# Patient Record
Sex: Female | Born: 1935 | Race: White | Hispanic: No | Smoking: Current every day smoker
Health system: Southern US, Community
[De-identification: ages and names within clinical notes are randomized; demographics above are authoritative.]

## PROBLEM LIST (undated history)

## (undated) DIAGNOSIS — R600 Localized edema: Secondary | ICD-10-CM

## (undated) DIAGNOSIS — M8580 Other specified disorders of bone density and structure, unspecified site: Secondary | ICD-10-CM

## (undated) DIAGNOSIS — K279 Peptic ulcer, site unspecified, unspecified as acute or chronic, without hemorrhage or perforation: Secondary | ICD-10-CM

## (undated) DIAGNOSIS — K449 Diaphragmatic hernia without obstruction or gangrene: Secondary | ICD-10-CM

## (undated) DIAGNOSIS — I10 Essential (primary) hypertension: Secondary | ICD-10-CM

## (undated) DIAGNOSIS — F419 Anxiety disorder, unspecified: Secondary | ICD-10-CM

## (undated) DIAGNOSIS — E785 Hyperlipidemia, unspecified: Secondary | ICD-10-CM

## (undated) DIAGNOSIS — K8020 Calculus of gallbladder without cholecystitis without obstruction: Secondary | ICD-10-CM

## (undated) DIAGNOSIS — F0390 Unspecified dementia without behavioral disturbance: Secondary | ICD-10-CM

## (undated) DIAGNOSIS — K219 Gastro-esophageal reflux disease without esophagitis: Secondary | ICD-10-CM

## (undated) DIAGNOSIS — E039 Hypothyroidism, unspecified: Secondary | ICD-10-CM

## (undated) DIAGNOSIS — I739 Peripheral vascular disease, unspecified: Secondary | ICD-10-CM

## (undated) DIAGNOSIS — M545 Low back pain, unspecified: Secondary | ICD-10-CM

## (undated) DIAGNOSIS — J449 Chronic obstructive pulmonary disease, unspecified: Secondary | ICD-10-CM

## (undated) DIAGNOSIS — J189 Pneumonia, unspecified organism: Secondary | ICD-10-CM

## (undated) DIAGNOSIS — K5730 Diverticulosis of large intestine without perforation or abscess without bleeding: Secondary | ICD-10-CM

## (undated) DIAGNOSIS — Z86010 Personal history of colonic polyps: Secondary | ICD-10-CM

## (undated) HISTORY — DX: Low back pain: M54.5

## (undated) HISTORY — DX: Diaphragmatic hernia without obstruction or gangrene: K44.9

## (undated) HISTORY — DX: Anxiety disorder, unspecified: F41.9

## (undated) HISTORY — DX: Other specified disorders of bone density and structure, unspecified site: M85.80

## (undated) HISTORY — DX: Pneumonia, unspecified organism: J18.9

## (undated) HISTORY — DX: Personal history of colonic polyps: Z86.010

## (undated) HISTORY — PX: TUBAL LIGATION: SHX77

## (undated) HISTORY — PX: ABDOMINAL HYSTERECTOMY: SHX81

## (undated) HISTORY — PX: CHOLECYSTECTOMY: SHX55

## (undated) HISTORY — DX: Diverticulosis of large intestine without perforation or abscess without bleeding: K57.30

## (undated) HISTORY — DX: Chronic obstructive pulmonary disease, unspecified: J44.9

## (undated) HISTORY — PX: CARPAL TUNNEL RELEASE: SHX101

## (undated) HISTORY — DX: Hyperlipidemia, unspecified: E78.5

## (undated) HISTORY — DX: Essential (primary) hypertension: I10

## (undated) HISTORY — PX: ABDOMINAL ADHESION SURGERY: SHX90

## (undated) HISTORY — PX: ROTATOR CUFF REPAIR: SHX139

## (undated) HISTORY — DX: Low back pain, unspecified: M54.50

## (undated) HISTORY — PX: CATARACT EXTRACTION W/ INTRAOCULAR LENS  IMPLANT, BILATERAL: SHX1307

## (undated) HISTORY — PX: NECK SURGERY: SHX720

## (undated) HISTORY — DX: Unspecified dementia, unspecified severity, without behavioral disturbance, psychotic disturbance, mood disturbance, and anxiety: F03.90

## (undated) HISTORY — DX: Localized edema: R60.0

## (undated) HISTORY — PX: APPENDECTOMY: SHX54

## (undated) HISTORY — DX: Gastro-esophageal reflux disease without esophagitis: K21.9

## (undated) HISTORY — DX: Peripheral vascular disease, unspecified: I73.9

## (undated) HISTORY — DX: Calculus of gallbladder without cholecystitis without obstruction: K80.20

## (undated) HISTORY — DX: Peptic ulcer, site unspecified, unspecified as acute or chronic, without hemorrhage or perforation: K27.9

## (undated) HISTORY — DX: Hypothyroidism, unspecified: E03.9

---

## 1998-08-09 ENCOUNTER — Ambulatory Visit (HOSPITAL_COMMUNITY): Admission: RE | Admit: 1998-08-09 | Discharge: 1998-08-09 | Payer: Self-pay | Admitting: Family Medicine

## 1998-08-09 ENCOUNTER — Encounter: Payer: Self-pay | Admitting: Family Medicine

## 1998-09-09 ENCOUNTER — Encounter: Payer: Self-pay | Admitting: Neurological Surgery

## 1998-09-09 ENCOUNTER — Inpatient Hospital Stay (HOSPITAL_COMMUNITY): Admission: RE | Admit: 1998-09-09 | Discharge: 1998-09-11 | Payer: Self-pay | Admitting: Neurological Surgery

## 1999-09-22 ENCOUNTER — Encounter: Admission: RE | Admit: 1999-09-22 | Discharge: 1999-09-22 | Payer: Self-pay | Admitting: Family Medicine

## 1999-09-22 ENCOUNTER — Encounter: Payer: Self-pay | Admitting: Family Medicine

## 1999-10-10 DIAGNOSIS — Z860100 Personal history of colon polyps, unspecified: Secondary | ICD-10-CM

## 1999-10-10 HISTORY — DX: Personal history of colonic polyps: Z86.010

## 1999-10-10 HISTORY — DX: Personal history of colon polyps, unspecified: Z86.0100

## 1999-11-08 ENCOUNTER — Encounter: Payer: Self-pay | Admitting: Neurological Surgery

## 1999-11-08 ENCOUNTER — Encounter: Admission: RE | Admit: 1999-11-08 | Discharge: 1999-11-08 | Payer: Self-pay | Admitting: Neurological Surgery

## 1999-12-15 ENCOUNTER — Encounter: Admission: RE | Admit: 1999-12-15 | Discharge: 2000-01-12 | Payer: Self-pay | Admitting: Neurological Surgery

## 2000-05-29 ENCOUNTER — Ambulatory Visit (HOSPITAL_COMMUNITY): Admission: RE | Admit: 2000-05-29 | Discharge: 2000-05-29 | Payer: Self-pay | Admitting: Gastroenterology

## 2000-05-29 ENCOUNTER — Encounter (INDEPENDENT_AMBULATORY_CARE_PROVIDER_SITE_OTHER): Payer: Self-pay

## 2000-09-24 ENCOUNTER — Encounter: Admission: RE | Admit: 2000-09-24 | Discharge: 2000-09-24 | Payer: Self-pay | Admitting: Family Medicine

## 2000-09-24 ENCOUNTER — Encounter: Payer: Self-pay | Admitting: Family Medicine

## 2000-11-21 ENCOUNTER — Encounter: Payer: Self-pay | Admitting: Neurological Surgery

## 2000-11-21 ENCOUNTER — Encounter: Admission: RE | Admit: 2000-11-21 | Discharge: 2000-11-21 | Payer: Self-pay | Admitting: Neurological Surgery

## 2000-11-26 ENCOUNTER — Encounter: Admission: RE | Admit: 2000-11-26 | Discharge: 2001-01-08 | Payer: Self-pay | Admitting: Neurological Surgery

## 2000-12-12 ENCOUNTER — Encounter: Payer: Self-pay | Admitting: Neurological Surgery

## 2000-12-12 ENCOUNTER — Encounter: Admission: RE | Admit: 2000-12-12 | Discharge: 2000-12-12 | Payer: Self-pay | Admitting: Neurological Surgery

## 2000-12-18 ENCOUNTER — Encounter: Admission: RE | Admit: 2000-12-18 | Discharge: 2000-12-18 | Payer: Self-pay | Admitting: Family Medicine

## 2000-12-18 ENCOUNTER — Encounter: Payer: Self-pay | Admitting: Family Medicine

## 2001-02-05 ENCOUNTER — Encounter: Admission: RE | Admit: 2001-02-05 | Discharge: 2001-03-12 | Payer: Self-pay | Admitting: Family Medicine

## 2001-02-26 ENCOUNTER — Encounter: Payer: Self-pay | Admitting: Family Medicine

## 2001-02-26 ENCOUNTER — Ambulatory Visit (HOSPITAL_COMMUNITY): Admission: RE | Admit: 2001-02-26 | Discharge: 2001-02-26 | Payer: Self-pay | Admitting: Family Medicine

## 2001-09-25 ENCOUNTER — Encounter: Payer: Self-pay | Admitting: Family Medicine

## 2001-09-25 ENCOUNTER — Encounter: Admission: RE | Admit: 2001-09-25 | Discharge: 2001-09-25 | Payer: Self-pay | Admitting: Family Medicine

## 2002-09-08 ENCOUNTER — Ambulatory Visit (HOSPITAL_COMMUNITY): Admission: RE | Admit: 2002-09-08 | Discharge: 2002-09-08 | Payer: Self-pay | Admitting: Internal Medicine

## 2002-09-08 ENCOUNTER — Encounter: Payer: Self-pay | Admitting: Internal Medicine

## 2002-09-29 ENCOUNTER — Encounter: Payer: Self-pay | Admitting: Internal Medicine

## 2002-09-29 ENCOUNTER — Encounter: Admission: RE | Admit: 2002-09-29 | Discharge: 2002-09-29 | Payer: Self-pay | Admitting: Internal Medicine

## 2002-10-31 ENCOUNTER — Encounter: Payer: Self-pay | Admitting: Neurological Surgery

## 2002-10-31 ENCOUNTER — Ambulatory Visit (HOSPITAL_COMMUNITY): Admission: RE | Admit: 2002-10-31 | Discharge: 2002-10-31 | Payer: Self-pay | Admitting: Neurological Surgery

## 2002-11-20 ENCOUNTER — Encounter: Admission: RE | Admit: 2002-11-20 | Discharge: 2002-11-20 | Payer: Self-pay | Admitting: Obstetrics and Gynecology

## 2002-11-20 ENCOUNTER — Encounter: Payer: Self-pay | Admitting: Obstetrics and Gynecology

## 2003-04-07 ENCOUNTER — Encounter: Admission: RE | Admit: 2003-04-07 | Discharge: 2003-04-07 | Payer: Self-pay | Admitting: Internal Medicine

## 2003-04-07 ENCOUNTER — Encounter: Payer: Self-pay | Admitting: Internal Medicine

## 2003-07-29 ENCOUNTER — Ambulatory Visit (HOSPITAL_COMMUNITY): Admission: RE | Admit: 2003-07-29 | Discharge: 2003-07-29 | Payer: Self-pay | Admitting: *Deleted

## 2003-07-29 ENCOUNTER — Encounter: Payer: Self-pay | Admitting: *Deleted

## 2003-11-03 ENCOUNTER — Ambulatory Visit (HOSPITAL_COMMUNITY): Admission: RE | Admit: 2003-11-03 | Discharge: 2003-11-03 | Payer: Self-pay | Admitting: Dermatology

## 2004-08-10 ENCOUNTER — Ambulatory Visit: Payer: Self-pay | Admitting: *Deleted

## 2004-09-08 ENCOUNTER — Ambulatory Visit: Payer: Self-pay | Admitting: Internal Medicine

## 2004-10-21 ENCOUNTER — Ambulatory Visit (HOSPITAL_COMMUNITY): Admission: RE | Admit: 2004-10-21 | Discharge: 2004-10-21 | Payer: Self-pay | Admitting: Internal Medicine

## 2004-11-09 ENCOUNTER — Ambulatory Visit: Payer: Self-pay | Admitting: Endocrinology

## 2005-02-06 ENCOUNTER — Ambulatory Visit: Payer: Self-pay | Admitting: Internal Medicine

## 2005-03-03 ENCOUNTER — Ambulatory Visit: Payer: Self-pay | Admitting: Internal Medicine

## 2005-05-09 ENCOUNTER — Ambulatory Visit: Payer: Self-pay | Admitting: Internal Medicine

## 2005-06-06 ENCOUNTER — Ambulatory Visit (HOSPITAL_COMMUNITY): Admission: RE | Admit: 2005-06-06 | Discharge: 2005-06-06 | Payer: Self-pay | Admitting: Obstetrics and Gynecology

## 2005-06-26 ENCOUNTER — Ambulatory Visit: Payer: Self-pay | Admitting: Gastroenterology

## 2005-07-26 ENCOUNTER — Ambulatory Visit: Payer: Self-pay | Admitting: Internal Medicine

## 2005-08-08 ENCOUNTER — Ambulatory Visit: Payer: Self-pay | Admitting: Internal Medicine

## 2005-08-10 ENCOUNTER — Ambulatory Visit: Payer: Self-pay | Admitting: Internal Medicine

## 2005-10-03 ENCOUNTER — Ambulatory Visit: Payer: Self-pay | Admitting: Cardiology

## 2005-10-03 ENCOUNTER — Inpatient Hospital Stay (HOSPITAL_COMMUNITY): Admission: EM | Admit: 2005-10-03 | Discharge: 2005-10-04 | Payer: Self-pay | Admitting: *Deleted

## 2005-10-10 ENCOUNTER — Ambulatory Visit: Payer: Self-pay

## 2005-10-16 ENCOUNTER — Ambulatory Visit: Payer: Self-pay | Admitting: Internal Medicine

## 2005-10-17 ENCOUNTER — Ambulatory Visit: Payer: Self-pay | Admitting: Cardiology

## 2005-11-09 ENCOUNTER — Ambulatory Visit: Payer: Self-pay | Admitting: Internal Medicine

## 2005-11-30 ENCOUNTER — Ambulatory Visit: Payer: Self-pay | Admitting: Cardiology

## 2005-12-13 ENCOUNTER — Ambulatory Visit (HOSPITAL_COMMUNITY): Admission: RE | Admit: 2005-12-13 | Discharge: 2005-12-13 | Payer: Self-pay | Admitting: Internal Medicine

## 2006-02-07 ENCOUNTER — Ambulatory Visit: Payer: Self-pay | Admitting: Internal Medicine

## 2006-03-26 ENCOUNTER — Ambulatory Visit: Payer: Self-pay | Admitting: Cardiology

## 2006-06-18 ENCOUNTER — Ambulatory Visit: Payer: Self-pay | Admitting: Internal Medicine

## 2006-07-30 ENCOUNTER — Ambulatory Visit: Payer: Self-pay | Admitting: Internal Medicine

## 2006-08-03 ENCOUNTER — Ambulatory Visit: Payer: Self-pay | Admitting: Internal Medicine

## 2007-01-01 ENCOUNTER — Encounter: Admission: RE | Admit: 2007-01-01 | Discharge: 2007-01-01 | Payer: Self-pay | Admitting: Internal Medicine

## 2007-01-10 ENCOUNTER — Ambulatory Visit: Payer: Self-pay | Admitting: Internal Medicine

## 2007-01-10 LAB — CONVERTED CEMR LAB
Alkaline Phosphatase: 85 units/L (ref 39–117)
Bilirubin, Direct: 0.1 mg/dL (ref 0.0–0.3)
Cholesterol: 185 mg/dL (ref 0–200)
Creatinine, Ser: 0.7 mg/dL (ref 0.4–1.2)
HDL: 34.5 mg/dL — ABNORMAL LOW (ref >39.0)
LDL Cholesterol: 125 mg/dL — ABNORMAL HIGH (ref 0–99)
Potassium: 4 meq/L (ref 3.5–5.1)
Sodium: 136 meq/L (ref 135–145)
Total CHOL/HDL Ratio: 5.4
Total Protein: 6.4 g/dL (ref 6.0–8.3)
Triglycerides: 126 mg/dL (ref 0–149)

## 2007-01-17 ENCOUNTER — Ambulatory Visit: Payer: Self-pay | Admitting: Internal Medicine

## 2007-04-16 ENCOUNTER — Ambulatory Visit: Payer: Self-pay | Admitting: Internal Medicine

## 2007-04-16 LAB — CONVERTED CEMR LAB
ALT: 17 units/L (ref 0–35)
AST: 20 units/L (ref 0–37)
VLDL: 10 mg/dL (ref 0–40)
Vit D, 1,25-Dihydroxy: 25 (ref 20–57)

## 2007-04-23 ENCOUNTER — Ambulatory Visit: Payer: Self-pay | Admitting: Internal Medicine

## 2007-05-08 ENCOUNTER — Ambulatory Visit: Payer: Self-pay | Admitting: Internal Medicine

## 2007-05-08 DIAGNOSIS — I6529 Occlusion and stenosis of unspecified carotid artery: Secondary | ICD-10-CM | POA: Insufficient documentation

## 2007-05-08 DIAGNOSIS — E039 Hypothyroidism, unspecified: Secondary | ICD-10-CM

## 2007-05-08 DIAGNOSIS — K219 Gastro-esophageal reflux disease without esophagitis: Secondary | ICD-10-CM | POA: Insufficient documentation

## 2007-05-08 DIAGNOSIS — I739 Peripheral vascular disease, unspecified: Secondary | ICD-10-CM | POA: Insufficient documentation

## 2007-05-08 DIAGNOSIS — Z87898 Personal history of other specified conditions: Secondary | ICD-10-CM | POA: Insufficient documentation

## 2007-07-10 ENCOUNTER — Ambulatory Visit: Payer: Self-pay | Admitting: Internal Medicine

## 2007-07-24 ENCOUNTER — Encounter: Payer: Self-pay | Admitting: Internal Medicine

## 2007-07-24 ENCOUNTER — Ambulatory Visit: Payer: Self-pay | Admitting: Internal Medicine

## 2007-07-24 DIAGNOSIS — E785 Hyperlipidemia, unspecified: Secondary | ICD-10-CM

## 2007-07-24 DIAGNOSIS — M810 Age-related osteoporosis without current pathological fracture: Secondary | ICD-10-CM | POA: Insufficient documentation

## 2007-10-07 ENCOUNTER — Ambulatory Visit: Payer: Self-pay | Admitting: Internal Medicine

## 2007-10-07 DIAGNOSIS — J209 Acute bronchitis, unspecified: Secondary | ICD-10-CM | POA: Insufficient documentation

## 2007-10-09 ENCOUNTER — Telehealth: Payer: Self-pay | Admitting: Internal Medicine

## 2007-10-22 ENCOUNTER — Ambulatory Visit: Payer: Self-pay | Admitting: Internal Medicine

## 2007-10-22 DIAGNOSIS — R079 Chest pain, unspecified: Secondary | ICD-10-CM | POA: Insufficient documentation

## 2007-10-23 LAB — CONVERTED CEMR LAB
AST: 19 units/L (ref 0–37)
BUN: 11 mg/dL (ref 6–23)
CO2: 34 meq/L — ABNORMAL HIGH (ref 19–32)
Calcium: 9.4 mg/dL (ref 8.4–10.5)
Chloride: 96 meq/L (ref 96–112)
Cholesterol: 136 mg/dL (ref 0–200)
GFR calc non Af Amer: 66 mL/min
Glucose, Bld: 139 mg/dL — ABNORMAL HIGH (ref 70–99)
TSH: 0.32 microintl units/mL — ABNORMAL LOW (ref 0.35–5.50)

## 2007-11-18 ENCOUNTER — Ambulatory Visit: Payer: Self-pay | Admitting: Internal Medicine

## 2007-11-18 DIAGNOSIS — R5383 Other fatigue: Secondary | ICD-10-CM

## 2007-11-18 DIAGNOSIS — R7301 Impaired fasting glucose: Secondary | ICD-10-CM

## 2007-11-18 DIAGNOSIS — R5381 Other malaise: Secondary | ICD-10-CM

## 2007-11-20 LAB — CONVERTED CEMR LAB
AST: 22 units/L (ref 0–37)
Creatinine, Ser: 1.1 mg/dL (ref 0.4–1.2)
Hgb A1c MFr Bld: 6.3 % — ABNORMAL HIGH (ref 4.6–6.0)
TSH: 0.8 microintl units/mL (ref 0.35–5.50)

## 2007-12-03 ENCOUNTER — Ambulatory Visit: Payer: Self-pay | Admitting: Internal Medicine

## 2007-12-04 LAB — CONVERTED CEMR LAB
Glucose, Bld: 132 mg/dL — ABNORMAL HIGH (ref 70–99)
TSH: 0.42 microintl units/mL (ref 0.35–5.50)

## 2008-01-30 ENCOUNTER — Telehealth: Payer: Self-pay | Admitting: Internal Medicine

## 2008-01-31 ENCOUNTER — Encounter: Payer: Self-pay | Admitting: Internal Medicine

## 2008-02-05 ENCOUNTER — Ambulatory Visit: Payer: Self-pay | Admitting: Internal Medicine

## 2008-06-04 ENCOUNTER — Ambulatory Visit: Payer: Self-pay | Admitting: Internal Medicine

## 2008-06-04 DIAGNOSIS — M25559 Pain in unspecified hip: Secondary | ICD-10-CM

## 2008-06-04 DIAGNOSIS — M549 Dorsalgia, unspecified: Secondary | ICD-10-CM | POA: Insufficient documentation

## 2008-06-08 ENCOUNTER — Ambulatory Visit: Payer: Self-pay | Admitting: Internal Medicine

## 2008-06-08 DIAGNOSIS — M545 Low back pain: Secondary | ICD-10-CM

## 2008-06-08 DIAGNOSIS — R609 Edema, unspecified: Secondary | ICD-10-CM

## 2008-07-08 ENCOUNTER — Ambulatory Visit: Payer: Self-pay | Admitting: Internal Medicine

## 2008-09-15 ENCOUNTER — Ambulatory Visit: Payer: Self-pay | Admitting: Internal Medicine

## 2008-09-30 ENCOUNTER — Ambulatory Visit: Payer: Self-pay | Admitting: Internal Medicine

## 2008-11-23 ENCOUNTER — Telehealth: Payer: Self-pay | Admitting: Internal Medicine

## 2009-01-21 ENCOUNTER — Ambulatory Visit: Payer: Self-pay | Admitting: Internal Medicine

## 2009-01-21 LAB — CONVERTED CEMR LAB
Alkaline Phosphatase: 65 units/L (ref 39–117)
Bilirubin, Direct: 0.1 mg/dL (ref 0.0–0.3)
CO2: 35 meq/L — ABNORMAL HIGH (ref 19–32)
Calcium: 9.5 mg/dL (ref 8.4–10.5)
Creatinine, Ser: 0.8 mg/dL (ref 0.4–1.2)
HDL: 38.8 mg/dL — ABNORMAL LOW (ref >39.00)
LDL Cholesterol: 67 mg/dL (ref 0–99)
Sodium: 139 meq/L (ref 135–145)
TSH: 0.9 microintl units/mL (ref 0.35–5.50)
Total Bilirubin: 1.2 mg/dL (ref 0.3–1.2)
Total CHOL/HDL Ratio: 3
Total Protein: 6.4 g/dL (ref 6.0–8.3)
Triglycerides: 90 mg/dL (ref 0.0–149.0)
VLDL: 18 mg/dL (ref 0.0–40.0)

## 2009-01-27 ENCOUNTER — Ambulatory Visit: Payer: Self-pay | Admitting: Internal Medicine

## 2009-01-27 DIAGNOSIS — D485 Neoplasm of uncertain behavior of skin: Secondary | ICD-10-CM

## 2009-01-27 DIAGNOSIS — R413 Other amnesia: Secondary | ICD-10-CM | POA: Insufficient documentation

## 2009-02-17 ENCOUNTER — Encounter: Payer: Self-pay | Admitting: Internal Medicine

## 2009-02-18 ENCOUNTER — Encounter: Payer: Self-pay | Admitting: Internal Medicine

## 2009-02-18 ENCOUNTER — Ambulatory Visit: Payer: Self-pay | Admitting: Internal Medicine

## 2009-04-28 ENCOUNTER — Ambulatory Visit: Payer: Self-pay | Admitting: Internal Medicine

## 2009-07-28 ENCOUNTER — Ambulatory Visit: Payer: Self-pay | Admitting: Internal Medicine

## 2009-07-28 LAB — CONVERTED CEMR LAB
AST: 20 units/L (ref 0–37)
Albumin: 4.1 g/dL (ref 3.5–5.2)
BUN: 14 mg/dL (ref 6–23)
Calcium: 9.2 mg/dL (ref 8.4–10.5)
Cholesterol: 130 mg/dL (ref 0–200)
Creatinine, Ser: 0.8 mg/dL (ref 0.4–1.2)
GFR calc non Af Amer: 75.01 mL/min (ref >60)
Glucose, Bld: 126 mg/dL — ABNORMAL HIGH (ref 70–99)
HDL: 40.9 mg/dL (ref >39.00)
Potassium: 4.3 meq/L (ref 3.5–5.1)
TSH: 0.74 microintl units/mL (ref 0.35–5.50)
Total Bilirubin: 0.8 mg/dL (ref 0.3–1.2)
Triglycerides: 72 mg/dL (ref 0.0–149.0)
VLDL: 14.4 mg/dL (ref 0.0–40.0)

## 2009-07-29 ENCOUNTER — Telehealth: Payer: Self-pay | Admitting: Internal Medicine

## 2009-08-03 ENCOUNTER — Ambulatory Visit: Payer: Self-pay | Admitting: Internal Medicine

## 2009-08-03 DIAGNOSIS — F17200 Nicotine dependence, unspecified, uncomplicated: Secondary | ICD-10-CM

## 2009-10-27 ENCOUNTER — Ambulatory Visit: Payer: Self-pay | Admitting: Internal Medicine

## 2010-01-19 ENCOUNTER — Ambulatory Visit: Payer: Self-pay | Admitting: Internal Medicine

## 2010-01-19 LAB — CONVERTED CEMR LAB
Albumin: 3.9 g/dL (ref 3.5–5.2)
Alkaline Phosphatase: 65 units/L (ref 39–117)
BUN: 14 mg/dL (ref 6–23)
CO2: 30 meq/L (ref 19–32)
Calcium: 8.9 mg/dL (ref 8.4–10.5)
Cholesterol: 120 mg/dL (ref 0–200)
GFR calc non Af Amer: 74.91 mL/min (ref >60)
Glucose, Bld: 115 mg/dL — ABNORMAL HIGH (ref 70–99)
HDL: 43.1 mg/dL (ref >39.00)
Hgb A1c MFr Bld: 5.8 % (ref 4.6–6.5)
Sodium: 140 meq/L (ref 135–145)
Total Protein: 6.5 g/dL (ref 6.0–8.3)
VLDL: 15.4 mg/dL (ref 0.0–40.0)

## 2010-01-25 ENCOUNTER — Ambulatory Visit: Payer: Self-pay | Admitting: Internal Medicine

## 2010-05-26 ENCOUNTER — Ambulatory Visit: Payer: Self-pay | Admitting: Internal Medicine

## 2010-05-26 LAB — CONVERTED CEMR LAB
CO2: 31 meq/L (ref 19–32)
Chloride: 100 meq/L (ref 96–112)
Glucose, Bld: 110 mg/dL — ABNORMAL HIGH (ref 70–99)
Sodium: 139 meq/L (ref 135–145)

## 2010-05-30 ENCOUNTER — Ambulatory Visit: Payer: Self-pay | Admitting: Internal Medicine

## 2010-05-30 DIAGNOSIS — M79609 Pain in unspecified limb: Secondary | ICD-10-CM | POA: Insufficient documentation

## 2010-07-27 ENCOUNTER — Ambulatory Visit: Payer: Self-pay | Admitting: Internal Medicine

## 2010-08-22 ENCOUNTER — Ambulatory Visit: Payer: Self-pay | Admitting: Internal Medicine

## 2010-10-04 ENCOUNTER — Telehealth: Payer: Self-pay | Admitting: Internal Medicine

## 2010-11-10 NOTE — Progress Notes (Signed)
Summary: [facility]   Phone Note Call from Patient Call back at Home Phone [phone]   Summary of Call: Patient is requesting a call back regarding gen prevacid - sounds like needs a PA? Initial call taken by: Lamar Sprinkles, CMA,  October 04, 2010 3:11 PM  Follow-up for Phone Call        Prevacid is covered per pharmacy. Pt would like cheaper alternative. Cheapest option is omeprazole. Follow-up by: Lamar Sprinkles, CMA,  October 06, 2010 2:32 PM  Additional Follow-up for Phone Call Additional follow up Details #1::        ok Additional Follow-up by: [name] MD,  October 06, 2010 4:00 PM    Additional Follow-up for Phone Call Additional follow up Details #2::    Pt informed  Follow-up by: Lamar Sprinkles, CMA,  October 06, 2010 4:36 PM  New/Updated Medications: OMEPRAZOLE 40 MG CPDR (OMEPRAZOLE) 1 by mouth qam for indigestion Prescriptions: OMEPRAZOLE 40 MG CPDR (OMEPRAZOLE) 1 by mouth qam for indigestion  #90 x 3   Entered and Authorized by:   [name] MD   Signed by:   Lamar Sprinkles, CMA on 10/06/2010   Method used:   Electronically to        [facility] [phone]* (retail)       [address]       [place], [place]  [address]       Ph: [phone]       Fax: [phone]   RxID:   [id number]

## 2010-11-10 NOTE — Assessment & Plan Note (Signed)
Summary: 3 mos f/u / #/ cd   Vital Signs:  Patient profile:   75 year old female Height:      61 inches Weight:      128.50 pounds BMI:     24.37 O2 Sat:      94 % on Room air Temp:     97.9 degrees F oral Pulse rate:   80 / minute BP sitting:   120 / 68  (left arm) Cuff size:   regular  Vitals Entered By: Lucious Groves (January 25, 2010 11:27 AM)  O2 Flow:  Room air CC: 3 mo f/u./kb Is Patient Diabetic? No Pain Assessment Patient in pain? no        CC:  3 mo f/u./kb.  History of Present Illness: C/o leg swelling daily x very long time The patient presents for a follow up of hypertension, COPD, memory loss, hyperlipidemia   Current Medications (verified): 1)  Aspirin Ec Low Strength 81 Mg  Tbec (Aspirin) .... Once Daily 2)  Klor-Con M20 20 Meq  Tbcr (Potassium Chloride Crys Cr) .... Take 1 By Mouth  Two Times A Day 3)  Prevacid 30 Mg  Cpdr (Lansoprazole) .... Once Daily 4)  Synthroid 50 Mcg  Tabs (Levothyroxine Sodium) .... Once Daily 5)  Zocor 40 Mg  Tabs (Simvastatin) .... Once Daily 6)  Calcium Citrate-Vitamin D 250-100 Mg-Unit  Tabs (Calcium-Vitamin D) .... Once Daily 7)  Flexeril 5 Mg  Tabs (Cyclobenzaprine Hcl) .... Take 1 Tablet By Mouth Three Times A Day As Needed For Neck Spasms 8)  Tramadol Hcl 50 Mg Tabs (Tramadol Hcl) .Marland Kitchen.. 1 - 2 By Mouth Q 6 Hrs As Needed 9)  Furosemide 20 Mg Tabs (Furosemide) .... Take 1 Tab By Mouth Every Day As Needed Swelling 10)  Meloxicam 7.5 Mg Tabs (Meloxicam) .Marland Kitchen.. 1 By Mouth Once Daily X 1 Mo, Then As Needed Pain  Allergies (verified): 1)  ! Codeine Sulfate (Codeine Sulfate) 2)  ! Percocet (Oxycodone-Acetaminophen)  Past History:  Past Surgical History: Last updated: 05/08/2007 Hysterectomy  Social History: Last updated: 10/07/2007 Current Smoker - one pack per day, greater than 30-pack-year history Alcohol use-no  Past Medical History: GERD Hypertension Hypothyroidism Peripheral vascular disease   COPD Hyperlipidemia Osteopenia Low back pain, OA R radiculopathy Chronic LE edema  Physical Exam  General:  NAD Nose:  External nasal examination shows no deformity or inflammation. Nasal mucosa are pink and moist without lesions or exudates. Mouth:  Oral mucosa and oropharynx without lesions or exudates.  Teeth in good repair. Lungs:  CTA Heart:  RRR Abdomen:  S/NT Msk:  No deformity or scoliosis noted of thoracic or lumbar spine.   Extremities:  No  edema B.   Neurologic:  No cranial nerve deficits noted. Station and gait are normal. Plantar reflexes are down-going bilaterally. DTRs are symmetrical throughout. Sensory, motor and coordinative functions appear intact. Skin:  Intact without suspicious lesions or rashes Psych:  Cognition and judgment appear intact. Alert and cooperative with normal attention span and concentration. No apparent delusions, illusions, hallucinations   Impression & Recommendations:  Problem # 1:  EDEMA (ICD-782.3) Assessment Deteriorated D/c [place]/Meloxicam Her updated medication list for this problem includes:    Furosemide 20 Mg Tabs (Furosemide) .Marland Kitchen... Take 1-2  tab by mouth every day as needed swelling  Problem # 2:  MEMORY LOSS (ICD-780.93) Assessment: Improved Better per pt  Problem # 3:  LOW BACK PAIN (ICD-724.2) Assessment: Unchanged  The following medications were removed from the  medication list:    Meloxicam 7.5 Mg Tabs (Meloxicam) .Marland Kitchen... 1 by mouth once daily x 1 mo, then as needed pain Her updated medication list for this problem includes:    Aspirin Ec Low Strength 81 Mg Tbec (Aspirin) ..... Once daily    Flexeril 5 Mg Tabs (Cyclobenzaprine hcl) .Marland Kitchen... Take 1 tablet by mouth three times a day as needed for neck spasms    Tramadol Hcl 50 Mg Tabs (Tramadol hcl) .Marland Kitchen... 1 - 2 by mouth q 6 hrs as needed    Hydrocodone-acetaminophen 5-325 Mg Tabs (Hydrocodone-acetaminophen) .Marland Kitchen... 1-2 by mouth two times a day as needed pain to try - she  was able to Tolerated well. Complicatons - none. Good pain relief following the procedure. it in the past OK  Problem # 4:  COPD, NOS (ICD-496) Assessment: Unchanged  Problem # 5:  SMOKER (ICD-305.1) Assessment: Unchanged  Encouraged smoking cessation and discussed different methods for smoking cessation.   Complete Medication List: 1)  Aspirin Ec Low Strength 81 Mg Tbec (Aspirin) .... Once daily 2)  Klor-con M20 20 Meq Tbcr (Potassium chloride crys cr) .... Take 1 by mouth  two times a day 3)  Prevacid 30 Mg Cpdr (Lansoprazole) .... Once daily 4)  Synthroid 50 Mcg Tabs (Levothyroxine sodium) .... Once daily 5)  Zocor 40 Mg Tabs (Simvastatin) .... Once daily 6)  Calcium Citrate-vitamin D 250-100 Mg-unit Tabs (Calcium-vitamin d) .... Once daily 7)  Flexeril 5 Mg Tabs (Cyclobenzaprine hcl) .... Take 1 tablet by mouth three times a day as needed for neck spasms 8)  Tramadol Hcl 50 Mg Tabs (Tramadol hcl) .Marland Kitchen.. 1 - 2 by mouth q 6 hrs as needed 9)  Furosemide 20 Mg Tabs (Furosemide) .... Take 1 tab by mouth every day as needed swelling 10)  Hydrocodone-acetaminophen 5-325 Mg Tabs (Hydrocodone-acetaminophen) .Marland Kitchen.. 1-2 by mouth two times a day as needed pain  Patient Instructions: 1)  Please schedule a follow-up appointment in 4 months. 2)  BMP prior to visit, ICD-9:401.1 Prescriptions: HYDROCODONE-ACETAMINOPHEN 5-325 MG TABS (HYDROCODONE-ACETAMINOPHEN) 1-2 by mouth two times a day as needed pain  #60 x 1   Entered and Authorized by:   [name] MD   Signed by:   [name] MD on 01/25/2010   Method used:   Print then Give to Patient   RxID:   [id number]

## 2010-11-10 NOTE — Assessment & Plan Note (Signed)
Summary: 3 MOS F/U / #/CD   Vital Signs:  Patient profile:   75 year old female Weight:      129 pounds BMI:     24.46 Temp:     97.5 degrees F oral Pulse rate:   82 / minute BP sitting:   90 / 66  (left arm)  Vitals Entered By: Tora Perches (October 27, 2009 1:56 PM) CC: f/u Is Patient Diabetic? No   CC:  f/u.  History of Present Illness: The patient presents for a follow up of hypertension, COPD, hyperlipidemia, memory issues.   Current Medications (verified): 1)  Aspirin Ec Low Strength 81 Mg  Tbec (Aspirin) .... Once Daily 2)  Klor-Con M20 20 Meq  Tbcr (Potassium Chloride Crys Cr) .... Take 1 By Mouth  Two Times A Day 3)  Prevacid 30 Mg  Cpdr (Lansoprazole) .... Once Daily 4)  Synthroid 50 Mcg  Tabs (Levothyroxine Sodium) .... Once Daily 5)  Zocor 40 Mg  Tabs (Simvastatin) .... Once Daily 6)  Calcium Citrate-Vitamin D 250-100 Mg-Unit  Tabs (Calcium-Vitamin D) .... Once Daily 7)  Flexeril 5 Mg  Tabs (Cyclobenzaprine Hcl) .... Take 1 Tablet By Mouth Three Times A Day As Needed For Neck Spasms 8)  Triamterene-Hctz 37.5-25 Mg  Tabs (Triamterene-Hctz) .... Once Daily 9)  Tramadol Hcl 50 Mg Tabs (Tramadol Hcl) .Marland Kitchen.. 1 - 2 By Mouth Q 6 Hrs As Needed 10)  Furosemide 20 Mg Tabs (Furosemide) .... Take 1 Tab By Mouth Every Day As Needed Swelling 11)  Meloxicam 7.5 Mg Tabs (Meloxicam) .Marland Kitchen.. 1 By Mouth Once Daily X 1 Mo, Then As Needed Pain  Allergies: 1)  ! Codeine Sulfate (Codeine Sulfate) 2)  ! Percocet (Oxycodone-Acetaminophen)  Physical Exam  General:  NAD Nose:  External nasal examination shows no deformity or inflammation. Nasal mucosa are pink and moist without lesions or exudates. Mouth:  Oral mucosa and oropharynx without lesions or exudates.  Teeth in good repair. Lungs:  CTA Heart:  RRR Abdomen:  S/NT Msk:  No deformity or scoliosis noted of thoracic or lumbar spine.   Extremities:  No  edema B.   Neurologic:  No cranial nerve deficits noted. Station and gait are  normal. Plantar reflexes are down-going bilaterally. DTRs are symmetrical throughout. Sensory, motor and coordinative functions appear intact.   Impression & Recommendations:  Problem # 1:  HYPERTENSION (ICD-401.9) Assessment Improved  The following medications were removed from the medication list:    Triamterene-hctz 37.5-25 Mg Tabs (Triamterene-hctz) ..... Once daily BP too low Her updated medication list for this problem includes:    Furosemide 20 Mg Tabs (Furosemide) .Marland Kitchen... Take 1 tab by mouth every day as needed swelling  Problem # 2:  MEMORY LOSS (ICD-780.93) Assessment: Improved  Problem # 3:  COPD, NOS (ICD-496) Assessment: Unchanged  Problem # 4:  LOW BACK PAIN (ICD-724.2) Assessment: Improved  Her updated medication list for this problem includes:    Aspirin Ec Low Strength 81 Mg Tbec (Aspirin) ..... Once daily    Flexeril 5 Mg Tabs (Cyclobenzaprine hcl) .Marland Kitchen... Take 1 tablet by mouth three times a day as needed for neck spasms    Tramadol Hcl 50 Mg Tabs (Tramadol hcl) .Marland Kitchen... 1 - 2 by mouth q 6 hrs as needed    Meloxicam 7.5 Mg Tabs (Meloxicam) .Marland Kitchen... 1 by mouth once daily x 1 mo, then as needed pain  Problem # 5:  HYPOTHYROIDISM (ICD-244.9) Assessment: Unchanged  Her updated medication list for this problem includes:  Synthroid 50 Mcg Tabs (Levothyroxine sodium) ..... Once daily  Problem # 6:  PERIPHERAL VASCULAR DISEASE (ICD-443.9) Assessment: Unchanged  Problem # 7:  SMOKER (ICD-305.1) Assessment: Unchanged  Encouraged smoking cessation and discussed different methods for smoking cessation.   Complete Medication List: 1)  Aspirin Ec Low Strength 81 Mg Tbec (Aspirin) .... Once daily 2)  Klor-con M20 20 Meq Tbcr (Potassium chloride crys cr) .... Take 1 by mouth  two times a day 3)  Prevacid 30 Mg Cpdr (Lansoprazole) .... Once daily 4)  Synthroid 50 Mcg Tabs (Levothyroxine sodium) .... Once daily 5)  Zocor 40 Mg Tabs (Simvastatin) .... Once daily 6)  Calcium  Citrate-vitamin D 250-100 Mg-unit Tabs (Calcium-vitamin d) .... Once daily 7)  Flexeril 5 Mg Tabs (Cyclobenzaprine hcl) .... Take 1 tablet by mouth three times a day as needed for neck spasms 8)  Tramadol Hcl 50 Mg Tabs (Tramadol hcl) .Marland Kitchen.. 1 - 2 by mouth q 6 hrs as needed 9)  Furosemide 20 Mg Tabs (Furosemide) .... Take 1 tab by mouth every day as needed swelling 10)  Meloxicam 7.5 Mg Tabs (Meloxicam) .Marland Kitchen.. 1 by mouth once daily x 1 mo, then as needed pain  Other Orders: Pneumococcal Vaccine (09811) Admin 1st Vaccine (91478) Admin 1st Vaccine [facility]) [phone])  Patient Instructions: 1)  Please schedule a follow-up appointment in 3 months. 2)  BMP prior to visit, ICD-9: 3)  Hepatic Panel prior to visit, ICD-9: 4)  Lipid Panel prior to visit, ICD-9:272.20  995.20 5)  HbgA1C prior to visit, ICD-9:   Pneumovax Vaccine    Vaccine Type: Pneumovax    Site: left deltoid    Mfr: Merck    Dose: 0.5 ml    Route: IM    Given by: Tora Perches    Exp. Date: 10/06/2011    Lot #: [id number]    VIS given: 05/06/96 version given October 27, 2009.

## 2010-11-10 NOTE — Assessment & Plan Note (Signed)
Summary: 3 mos f/u #cd   Vital Signs:  Patient profile:   75 year old female Height:      61 inches Weight:      120 pounds BMI:     22.76 Temp:     98.2 degrees F oral Pulse rate:   68 / minute Pulse rhythm:   regular Resp:     16 per minute BP sitting:   152 / 76  (left arm) Cuff size:   regular  Vitals Entered By: Lanier Prude, Beverly Gust) (August 22, 2010 2:35 PM) CC: 3 mo f/u Is Patient Diabetic? No   CC:  3 mo f/u.  History of Present Illness: The patient presents for a follow up of hypertension, memory loss, hyperlipidemia    Current Medications (verified): 1)  Aspirin Ec Low Strength 81 Mg  Tbec (Aspirin) .... Once Daily 2)  Klor-Con M20 20 Meq  Tbcr (Potassium Chloride Crys Cr) .... Take 1 By Mouth  Two Times A Day 3)  Prevacid 30 Mg  Cpdr (Lansoprazole) .... Once Daily 4)  Synthroid 50 Mcg  Tabs (Levothyroxine Sodium) .... Once Daily 5)  Zocor 40 Mg  Tabs (Simvastatin) .... Once Daily 6)  Calcium Citrate-Vitamin D 250-100 Mg-Unit  Tabs (Calcium-Vitamin D) .... Once Daily 7)  Flexeril 5 Mg  Tabs (Cyclobenzaprine Hcl) .... Take 1 Tablet By Mouth Three Times A Day As Needed For Neck Spasms 8)  Tramadol Hcl 50 Mg Tabs (Tramadol Hcl) .Marland Kitchen.. 1 - 2 By Mouth Q 6 Hrs As Needed 9)  Furosemide 20 Mg Tabs (Furosemide) .... Take 1 Tab By Mouth Every Day As Needed Swelling 10)  Hydrocodone-Acetaminophen 5-325 Mg Tabs (Hydrocodone-Acetaminophen) .Marland Kitchen.. 1-2 By Mouth Two Times A Day As Needed Pain 11)  Aricept 10 Mg Tabs (Donepezil Hcl) .Marland Kitchen.. 1 By Mouth Once Daily For Memory 12)  Pennsaid 1.5 % Soln (Diclofenac Sodium) .... 3-5 Gtt On Skin Three Times A Day For Pain  Allergies (verified): 1)  ! Codeine Sulfate (Codeine Sulfate) 2)  ! Percocet (Oxycodone-Acetaminophen)  Past History:  Past Medical History: Last updated: 01/25/2010 GERD Hypertension Hypothyroidism Peripheral vascular disease  COPD Hyperlipidemia Osteopenia Low back pain, OA R radiculopathy Chronic LE  edema  Social History: Last updated: 10/07/2007 Current Smoker - one pack per day, greater than 30-pack-year history Alcohol use-no  Review of Systems  The patient denies fever, weight loss, and abdominal pain.         ?IBS  Physical Exam  General:  NAD Nose:  External nasal examination shows no deformity or inflammation. Nasal mucosa are pink and moist without lesions or exudates. Mouth:  Oral mucosa and oropharynx without lesions or exudates.  Teeth in good repair. Lungs:  CTA Heart:  RRR Abdomen:  S/NT Msk:  No deformity or scoliosis noted of thoracic or lumbar spine.  R 5th dist phalanx is tender and swollen Neurologic:  No cranial nerve deficits noted. Station and gait are normal. Plantar reflexes are down-going bilaterally. DTRs are symmetrical throughout. Sensory, motor and coordinative functions appear intact. Skin:  Intact without suspicious lesions or rashes Psych:  Cognition and judgment appear intact. Alert and cooperative with normal attention span and concentration. No apparent delusions, illusions, hallucinations   Impression & Recommendations:  Problem # 1:  MEMORY LOSS (ICD-780.93) Assessment Unchanged On the regimen of medicine(s) reflected in the chart    Problem # 2:  EDEMA (ICD-782.3) Assessment: Improved  Her updated medication list for this problem includes:    Furosemide 20 Mg Tabs (  Furosemide) .Marland Kitchen... Take 1 tab by mouth every day as needed swelling  Problem # 3:  HAND PAIN (ICD-729.5) Assessment: Improved  Problem # 4:  HYPERLIPIDEMIA (ICD-272.4) Assessment: Unchanged  Her updated medication list for this problem includes:    Zocor 40 Mg Tabs (Simvastatin) ..... Once daily  Complete Medication List: 1)  Aspirin Ec Low Strength 81 Mg Tbec (Aspirin) .... Once daily 2)  Klor-con M20 20 Meq Tbcr (Potassium chloride crys cr) .... Take 1 by mouth  two times a day 3)  Prevacid 30 Mg Cpdr (Lansoprazole) .... Once daily 4)  Synthroid 50 Mcg Tabs  (Levothyroxine sodium) .... Once daily 5)  Zocor 40 Mg Tabs (Simvastatin) .... Once daily 6)  Calcium Citrate-vitamin D 250-100 Mg-unit Tabs (Calcium-vitamin d) .... Once daily 7)  Flexeril 5 Mg Tabs (Cyclobenzaprine hcl) .... Take 1 tablet by mouth three times a day as needed for neck spasms 8)  Tramadol Hcl 50 Mg Tabs (Tramadol hcl) .Marland Kitchen.. 1 - 2 by mouth q 6 hrs as needed 9)  Furosemide 20 Mg Tabs (Furosemide) .... Take 1 tab by mouth every day as needed swelling 10)  Hydrocodone-acetaminophen 5-325 Mg Tabs (Hydrocodone-acetaminophen) .Marland Kitchen.. 1-2 by mouth two times a day as needed pain 11)  Aricept 10 Mg Tabs (Donepezil hcl) .Marland Kitchen.. 1 by mouth once daily for memory 12)  Pennsaid 1.5 % Soln (Diclofenac sodium) .... 3-5 gtt on skin three times a day for pain  Patient Instructions: 1)  Please schedule a follow-up appointment in 4 months well w/labs. Prescriptions: SYNTHROID 50 MCG  TABS (LEVOTHYROXINE SODIUM) once daily  #30 Tablet x 11   Entered and Authorized by:   [name] MD   Signed by:   [name] MD on 08/22/2010   Method used:   Electronically to        [facility] [phone]* (retail)       [address]       [place], [place]  [address]       Ph: [phone]       Fax: [phone]   RxID:   [id number] PREVACID 30 MG  CPDR (LANSOPRAZOLE) once daily  #30 Tablet x 11   Entered and Authorized by:   [name] MD   Signed by:   [name] MD on 08/22/2010   Method used:   Electronically to        [facility] [phone]* (retail)       [address]       [place], [place]  [address]       Ph: [phone]       Fax: [phone]   RxID:   [id number] KLOR-CON M20 20 MEQ  TBCR (POTASSIUM CHLORIDE CRYS CR) Take 1 by mouth  two times a day  #120 Tablet x 11   Entered and Authorized by:   [name] MD   Signed by:   [name] MD on 08/22/2010   Method used:   Electronically to        [facility] [phone]* (retail)        [address]       [place], [place]  [address]       Ph: [phone]       Fax: [phone]   RxID:   [id number] ZOCOR 40 MG  TABS (SIMVASTATIN) once daily  #30 Tablet x 11   Entered and Authorized by:   Georgina Quint  Yazmeen Woolf MD   Signed by:   [name] MD on 08/22/2010   Method used:   Electronically to        [facility] [phone]* (retail)       [address]       [place], [place]  [address]       Ph: [phone]       Fax: [phone]   RxID:   [id number]    Orders Added: 1)  Est. Patient Level IV [75[address]]

## 2010-11-10 NOTE — Assessment & Plan Note (Signed)
Summary: 4 mo rov /nws  #   Vital Signs:  Patient profile:   75 year old female Height:      61 inches Weight:      123 pounds BMI:     23.32 O2 Sat:      94 % on Room air Temp:     98.2 degrees F oral Pulse rate:   83 / minute Pulse rhythm:   regular Resp:     16 per minute BP sitting:   130 / 80  (left arm) Cuff size:   regular  Vitals Entered By: Lanier Prude, CMA(AAMA) (May 30, 2010 11:17 AM)  O2 Flow:  Room air CC: 4 mo f/u Is Patient Diabetic? No   CC:  4 mo f/u.  History of Present Illness: The patient presents for a follow up of back pain, anxiety, memory loss C/o R 5th digit OA.   Current Medications (verified): 1)  Aspirin Ec Low Strength 81 Mg  Tbec (Aspirin) .... Once Daily 2)  Klor-Con M20 20 Meq  Tbcr (Potassium Chloride Crys Cr) .... Take 1 By Mouth  Two Times A Day 3)  Prevacid 30 Mg  Cpdr (Lansoprazole) .... Once Daily 4)  Synthroid 50 Mcg  Tabs (Levothyroxine Sodium) .... Once Daily 5)  Zocor 40 Mg  Tabs (Simvastatin) .... Once Daily 6)  Calcium Citrate-Vitamin D 250-100 Mg-Unit  Tabs (Calcium-Vitamin D) .... Once Daily 7)  Flexeril 5 Mg  Tabs (Cyclobenzaprine Hcl) .... Take 1 Tablet By Mouth Three Times A Day As Needed For Neck Spasms 8)  Tramadol Hcl 50 Mg Tabs (Tramadol Hcl) .Marland Kitchen.. 1 - 2 By Mouth Q 6 Hrs As Needed 9)  Furosemide 20 Mg Tabs (Furosemide) .... Take 1 Tab By Mouth Every Day As Needed Swelling 10)  Hydrocodone-Acetaminophen 5-325 Mg Tabs (Hydrocodone-Acetaminophen) .Marland Kitchen.. 1-2 By Mouth Two Times A Day As Needed Pain  Allergies (verified): 1)  ! Codeine Sulfate (Codeine Sulfate) 2)  ! Percocet (Oxycodone-Acetaminophen)  Past History:  Past Medical History: Last updated: 01/25/2010 GERD Hypertension Hypothyroidism Peripheral vascular disease  COPD Hyperlipidemia Osteopenia Low back pain, OA R radiculopathy Chronic LE edema  Social History: Last updated: 10/07/2007 Current Smoker - one pack per day, greater than 30-pack-year  history Alcohol use-no  Review of Systems  The patient denies fever, hemoptysis, and abdominal pain.    Physical Exam  General:  NAD Nose:  External nasal examination shows no deformity or inflammation. Nasal mucosa are pink and moist without lesions or exudates. Mouth:  Oral mucosa and oropharynx without lesions or exudates.  Teeth in good repair. Lungs:  CTA Heart:  RRR Abdomen:  S/NT Msk:  No deformity or scoliosis noted of thoracic or lumbar spine.  R 5th dist phalanx is tender and swollen Extremities:  No  edema B.   Neurologic:  No cranial nerve deficits noted. Station and gait are normal. Plantar reflexes are down-going bilaterally. DTRs are symmetrical throughout. Sensory, motor and coordinative functions appear intact. Skin:  Intact without suspicious lesions or rashes   Impression & Recommendations:  Problem # 1:  MEMORY LOSS (ICD-780.93) Assessment Unchanged Start Aricept  Problem # 2:  HAND PAIN (ICD-729.5) R 5th dist pgalanx OA [place]  Problem # 3:  LOW BACK PAIN (ICD-724.2) Assessment: Unchanged  Her updated medication list for this problem includes:    Aspirin Ec Low Strength 81 Mg Tbec (Aspirin) ..... Once daily    Flexeril 5 Mg Tabs (Cyclobenzaprine hcl) .Marland Kitchen... Take 1 tablet by mouth three times  a day as needed for neck spasms    Tramadol Hcl 50 Mg Tabs (Tramadol hcl) .Marland Kitchen... 1 - 2 by mouth q 6 hrs as needed    Hydrocodone-acetaminophen 5-325 Mg Tabs (Hydrocodone-acetaminophen) .Marland Kitchen... 1-2 by mouth two times a day as needed pain  Problem # 4:  HYPERTENSION (ICD-401.9) Assessment: Unchanged The labs were reviewed with the patient.  Her updated medication list for this problem includes:    Furosemide 20 Mg Tabs (Furosemide) .Marland Kitchen... Take 1 tab by mouth every day as needed swelling  BP today: 130/80 Prior BP: 120/68 (01/25/2010)  Labs Reviewed: K+: 3.9 (05/26/2010) Creat: : 0.6 (05/26/2010)   Chol: 120 (01/19/2010)   HDL: 43.10 (01/19/2010)   LDL: 62  (01/19/2010)   TG: 77.0 (01/19/2010)  Complete Medication List: 1)  Aspirin Ec Low Strength 81 Mg Tbec (Aspirin) .... Once daily 2)  Klor-con M20 20 Meq Tbcr (Potassium chloride crys cr) .... Take 1 by mouth  two times a day 3)  Prevacid 30 Mg Cpdr (Lansoprazole) .... Once daily 4)  Synthroid 50 Mcg Tabs (Levothyroxine sodium) .... Once daily 5)  Zocor 40 Mg Tabs (Simvastatin) .... Once daily 6)  Calcium Citrate-vitamin D 250-100 Mg-unit Tabs (Calcium-vitamin d) .... Once daily 7)  Flexeril 5 Mg Tabs (Cyclobenzaprine hcl) .... Take 1 tablet by mouth three times a day as needed for neck spasms 8)  Tramadol Hcl 50 Mg Tabs (Tramadol hcl) .Marland Kitchen.. 1 - 2 by mouth q 6 hrs as needed 9)  Furosemide 20 Mg Tabs (Furosemide) .... Take 1 tab by mouth every day as needed swelling 10)  Hydrocodone-acetaminophen 5-325 Mg Tabs (Hydrocodone-acetaminophen) .Marland Kitchen.. 1-2 by mouth two times a day as needed pain 11)  Aricept 10 Mg Tabs (Donepezil hcl) .Marland Kitchen.. 1 by mouth once daily for memory 12)  Pennsaid 1.5 % Soln (Diclofenac sodium) .... 3-5 gtt on skin three times a day for pain  Patient Instructions: 1)  Please schedule a follow-up appointment in 3 months. Prescriptions: PENNSAID 1.5 % SOLN (DICLOFENAC SODIUM) 3-5 gtt on skin three times a day for pain  #1 x 3   Entered and Authorized by:   [name] MD   Signed by:   [name] MD on 05/30/2010   Method used:   Print then Give to Patient   RxID:   [id number] ARICEPT 10 MG TABS (DONEPEZIL HCL) 1 by mouth once daily for memory  #30 x 12   Entered and Authorized by:   [name] MD   Signed by:   [name] MD on 05/30/2010   Method used:   Print then Give to Patient   RxID:   [id number]

## 2010-11-10 NOTE — Assessment & Plan Note (Signed)
Summary: [name]  Nurse Visit   Allergies: 1)  ! Codeine Sulfate (Codeine Sulfate) 2)  ! Percocet (Oxycodone-Acetaminophen)  Orders Added: 1)  Flu Vaccine 50yrs + MEDICARE PATIENTS [Q2039] 2)  Administration Flu vaccine - MCR [G0008]  .lbmedflu   Flu Vaccine Consent Questions     Do you have a history of severe allergic reactions to this vaccine? no    Any prior history of allergic reactions to egg and/or gelatin? no    Do you have a sensitivity to the preservative Thimersol? no    Do you have a past history of Guillan-Barre Syndrome? no    Do you currently have an acute febrile illness? no    Have you ever had a severe reaction to latex? no    Vaccine information given and explained to patient? yes    Are you currently pregnant? no    Lot Number:[id number]   Exp Date:04/08/2011   Site Given  Left Deltoid IM Lanier Prude, [facility])  July 27, 2010 1:23 PM

## 2010-12-19 ENCOUNTER — Other Ambulatory Visit: Payer: Self-pay

## 2010-12-26 ENCOUNTER — Ambulatory Visit (INDEPENDENT_AMBULATORY_CARE_PROVIDER_SITE_OTHER): Payer: Medicare Other | Admitting: Internal Medicine

## 2010-12-26 ENCOUNTER — Encounter: Payer: Self-pay | Admitting: Internal Medicine

## 2010-12-26 DIAGNOSIS — E039 Hypothyroidism, unspecified: Secondary | ICD-10-CM

## 2010-12-26 DIAGNOSIS — R413 Other amnesia: Secondary | ICD-10-CM

## 2010-12-26 DIAGNOSIS — M545 Low back pain: Secondary | ICD-10-CM

## 2010-12-26 DIAGNOSIS — J449 Chronic obstructive pulmonary disease, unspecified: Secondary | ICD-10-CM

## 2010-12-26 DIAGNOSIS — I10 Essential (primary) hypertension: Secondary | ICD-10-CM

## 2010-12-30 ENCOUNTER — Other Ambulatory Visit: Payer: Self-pay | Admitting: Internal Medicine

## 2011-01-05 NOTE — Assessment & Plan Note (Signed)
Summary: 4 MON ROV/ NWS #   Vital Signs:  Patient profile:   75 year old female Height:      61 inches Weight:      116 pounds BMI:     22.00 Temp:     97.9 degrees F oral Pulse rate:   88 / minute Pulse rhythm:   regular Resp:     16 per minute BP sitting:   110 / 70  (left arm) Cuff size:   regular  Vitals Entered By: Lanier Prude, CMA(AAMA) (December 26, 2010 10:12 AM) CC: 4 mo f/u Is Patient Diabetic? No Comments pt is fasting today   CC:  4 mo f/u.  History of Present Illness: The patient presents for a follow up of hypertension, COPD, hyperlipidemia and dementia   Current Medications (verified): 1)  Aspirin Ec Low Strength 81 Mg  Tbec (Aspirin) .... Once Daily 2)  Klor-Con M20 20 Meq  Tbcr (Potassium Chloride Crys Cr) .... Take 1 By Mouth  Two Times A Day 3)  Synthroid 50 Mcg  Tabs (Levothyroxine Sodium) .... Once Daily 4)  Zocor 40 Mg  Tabs (Simvastatin) .... Once Daily 5)  Calcium Citrate-Vitamin D 250-100 Mg-Unit  Tabs (Calcium-Vitamin D) .... Once Daily 6)  Flexeril 5 Mg  Tabs (Cyclobenzaprine Hcl) .... Take 1 Tablet By Mouth Three Times A Day As Needed For Neck Spasms 7)  Tramadol Hcl 50 Mg Tabs (Tramadol Hcl) .Marland Kitchen.. 1 - 2 By Mouth Q 6 Hrs As Needed 8)  Furosemide 20 Mg Tabs (Furosemide) .... Take 1 Tab By Mouth Every Day As Needed Swelling 9)  Hydrocodone-Acetaminophen 5-325 Mg Tabs (Hydrocodone-Acetaminophen) .Marland Kitchen.. 1-2 By Mouth Two Times A Day As Needed Pain 10)  Aricept 10 Mg Tabs (Donepezil Hcl) .Marland Kitchen.. 1 By Mouth Once Daily For Memory 11)  Pennsaid 1.5 % Soln (Diclofenac Sodium) .... 3-5 Gtt On Skin Three Times A Day For Pain 12)  Omeprazole 40 Mg Cpdr (Omeprazole) .Marland Kitchen.. 1 By Mouth Qam For Indigestion  Allergies (verified): 1)  ! Codeine Sulfate (Codeine Sulfate) 2)  ! Percocet (Oxycodone-Acetaminophen)  Past History:  Past Medical History: Last updated: 01/25/2010 GERD Hypertension Hypothyroidism Peripheral vascular disease   COPD Hyperlipidemia Osteopenia Low back pain, OA R radiculopathy Chronic LE edema  Social History: Last updated: 10/07/2007 Current Smoker - one pack per day, greater than 30-pack-year history Alcohol use-no  Review of Systems  The patient denies anorexia, weight loss, chest pain, dyspnea on exertion, peripheral edema, prolonged cough, and abdominal pain.    Physical Exam  General:  NAD Nose:  External nasal examination shows no deformity or inflammation. Nasal mucosa are pink and moist without lesions or exudates. Mouth:  Oral mucosa and oropharynx without lesions or exudates.  Teeth in good repair. Lungs:  CTA Heart:  RRR Abdomen:  S/NT Msk:  No deformity or scoliosis noted of thoracic or lumbar spine.  R 5th dist phalanx is tender and swollen LS NT Extremities:  No  edema B.   Neurologic:  No cranial nerve deficits noted. Station and gait are normal. Plantar reflexes are down-going bilaterally. DTRs are symmetrical throughout. Sensory, motor and coordinative functions appear intact. Skin:  Intact without suspicious lesions or rashes Psych:  Cognition and judgment appear intact. Alert and cooperative with normal attention span and concentration. No apparent delusions, illusions, hallucinations   Impression & Recommendations:  Problem # 1:  MEMORY LOSS (ICD-780.93) Assessment Unchanged On the regimen of medicine(s) reflected in the chart  Added Namenda  Problem #  2:  LOW BACK PAIN (ICD-724.2) Assessment: Unchanged  The following medications were removed from the medication list:    Tramadol Hcl 50 Mg Tabs (Tramadol hcl) .Marland Kitchen... 1 - 2 by mouth q 6 hrs as needed    Hydrocodone-acetaminophen 5-325 Mg Tabs (Hydrocodone-acetaminophen) .Marland Kitchen... 1-2 by mouth two times a day as needed pain Her updated medication list for this problem includes:    Aspirin Ec Low Strength 81 Mg Tbec (Aspirin) ..... Once daily    Flexeril 5 Mg Tabs (Cyclobenzaprine hcl) .Marland Kitchen... Take 1 tablet by mouth  three times a day as needed for neck spasms  Problem # 3:  COPD, NOS (ICD-496) Assessment: Unchanged On the regimen of medicine(s) reflected in the chart    Problem # 4:  HYPERTENSION (ICD-401.9) Assessment: Unchanged  Her updated medication list for this problem includes:    Furosemide 20 Mg Tabs (Furosemide) .Marland Kitchen... Take 1 tab by mouth every day as needed swelling  Complete Medication List: 1)  Aspirin Ec Low Strength 81 Mg Tbec (Aspirin) .... Once daily 2)  Klor-con M20 20 Meq Tbcr (Potassium chloride crys cr) .... Take 1 by mouth  two times a day 3)  Synthroid 50 Mcg Tabs (Levothyroxine sodium) .... Once daily 4)  Zocor 40 Mg Tabs (Simvastatin) .... Once daily 5)  Calcium Citrate-vitamin D 250-100 Mg-unit Tabs (Calcium-vitamin d) .... Once daily 6)  Flexeril 5 Mg Tabs (Cyclobenzaprine hcl) .... Take 1 tablet by mouth three times a day as needed for neck spasms 7)  Furosemide 20 Mg Tabs (Furosemide) .... Take 1 tab by mouth every day as needed swelling 8)  Aricept 10 Mg Tabs (Donepezil hcl) .Marland Kitchen.. 1 by mouth once daily for memory 9)  Pennsaid 1.5 % Soln (Diclofenac sodium) .... 3-5 gtt on skin three times a day for pain 10)  Namenda 10 Mg Tabs (Memantine hcl) .Marland Kitchen.. 1 by mouth two times a day for memory 11)  Omeprazole 40 Mg Cpdr (Omeprazole) .Marland Kitchen.. 1 by mouth qam for indigestion 12)  Vitamin D 1000 Unit Tabs (Cholecalciferol) .Marland Kitchen.. 1 by mouth qd  Patient Instructions: 1)  Please schedule a follow-up appointment in 3 months. Prescriptions: NAMENDA 10 MG TABS (MEMANTINE HCL) 1 by mouth two times a day for memory  #60 x 12   Entered and Authorized by:   [name] MD   Signed by:   [name] MD on 12/26/2010   Method used:   Print then Give to Patient   RxID:   [id number]    Orders Added: 1)  Est. Patient Level IV [44010]

## 2011-02-21 NOTE — Assessment & Plan Note (Signed)
[facility]                           PRIMARY CARE OFFICE NOTE   ISABELLAROSE, KOPE               MRN:          [id number]  DATE:05/08/2007                            DOB:          1936-08-11    PROCEDURE PERFORMED:  Removal of moles.   PHYSICIAN:  Georgina Quint. Plotnikov, M.D.   INDICATIONS:  Mole with irregular color, rule out melanoma.   Risks including bleeding, infection, scar formation and others as well  as incomplete procedures, and benefits were explained to the patient in  detail.  She agreed to proceed.   DESCRIPTION OF THE PROCEDURE:  The patient was placed in the decubitus  position.  Mole number one measuring 6 mm in the left upper quadrant was  prepped with Betadine and alcohol.  This was injected with 0.5 mL of 2%  lidocaine with epinephrine.  Shaved biopsy with a dermablade was  performed.  The specimen was sent to the lab.  The wound was treated  with a hyfercator.  The patient tolerated this procedure well.  Complications were none.  Wound instructions were provided.   Mole number two measuring 5 mm below mole number one was removed in an  identical fashion.  The specimen was sent to the lab.  The patient  tolerated this procedure well.  Complications were none.     Georgina Quint. Plotnikov, MD  Electronically Signed    AVP/MedQ  DD: 05/08/2007  DT: 05/09/2007  Job #: [id number]

## 2011-02-24 NOTE — H&P (Signed)
[name], [name]        ACCOUNT NO.:  [id number]   MEDICAL RECORD NO.:  [id number]          PATIENT TYPE:  INP   LOCATION:  1832                         FACILITY:  [facility]   PHYSICIAN:  Olga Millers, M.D. LHCDATE OF BIRTH:  1936-02-25   DATE OF ADMISSION:  10/03/2005  DATE OF DISCHARGE:                                HISTORY & PHYSICAL   PRIMARY CARE PHYSICIAN:  Dr. Posey Rea   PRIMARY CARDIOLOGIST:  Dr. Loraine Leriche Pulsipher   CHIEF COMPLAINT:  Chest pain.   HISTORY OF PRESENT ILLNESS:  [name] is a 75 year old white female  with no history of coronary artery disease.  She had onset of substernal  chest pain with minimal exertion at approximately 10 a.m. today.  She says  it reached an 8/10 and was associated with nausea and diaphoresis, but no  shortness of breath.  It radiated up both sides of her neck into the left  axilla and arm.  It is different from her previous episodes of chest pain.  She did have an episode of this type of chest pain last Tuesday which  resolved after taking a BC powder.  She did not try any therapy today.  She  came to the emergency room and says her pain level is currently a 5/10.  She  has no history of exertional symptoms and has not had chest pain recently  until last Tuesday.   PAST MEDICAL HISTORY:  1.  Hypertension.  2.  Hyperlipidemia.  3.  Family history of coronary artery disease.  4.  Remote history of tobacco use.  5.  Atypical chest pain with a negative stress test in December 2003,      preserved left ventricular function with an EF of 75%.  6.  Lower extremity edema secondary to venous insufficiency.  7.  Hypothyroidism.  8.  Questionable history of TIA.   ALLERGIES:  CODEINE.   CURRENT MEDICATIONS:  1.  Synthroid 0.05 mg daily.  2.  Aspirin 81 mg a day.  3.  Lasix 40 mg daily p.r.n.  She has taken this for the last two days in a      row.  4.  Potassium 20 mEq two tablets b.i.d.  5.  Triamterene/HCTZ 37.5/25 mg  one daily.  6.  Prevacid 30 mg a day.   SOCIAL HISTORY:  She lives in [place] with her husband and is retired  from Audiological scientist and clerical work.  She has an approximately 40-pack-year  history of tobacco use and quit three years ago.  She has no history of  alcohol or drug abuse.   FAMILY HISTORY:  Her mother died at age 79 of old age and her father died at  age 107 of an MI.  She has a history of heart disease in three of her  siblings.   REVIEW OF SYSTEMS:  She denies fevers or chills.  She has occasional  headaches.  She denies rashes or lesions.  Chest pain as described above.  She has some chronic dyspnea on exertion, but no orthopnea or PND.  Her  edema is controlled.  She denies palpitations or presyncope as well as  cough  or wheezing.  There is no hematuria, hemoptysis, or hematemesis.  She has  occasional arthralgias, but is no longer taking medicine for those.  Her  reflux symptoms are controlled on Prevacid.  Review of systems is otherwise  negative.   PHYSICAL EXAMINATION:  VITAL SIGNS:  Her blood pressure is 130/60 with a  heart rate of 94, respiratory rate 18, O2 saturation 94% on room air.  GENERAL:  She is a well-developed, elderly white female in no acute  distress.  HEENT:  Her head is normocephalic, atraumatic with pupils are equal, round,  and reactive to light and accommodation.  Extraocular movements are intact.  Sclerae clear.  Nares without discharge.  NECK:  There is no lymphadenopathy, thyromegaly, bruit, or JVD noted.  CARDIOVASCULAR:  Heart rate heart is regular in rate and rhythm with an S1,  S2, and no significant murmur, rub, or gallop is noted.  Her distal pulses  are 2+ in all four extremities.  LUNGS:  Clear to auscultation bilaterally.  SKIN:  No rashes or lesions are noted.  ABDOMEN:  Soft and nontender with active bowel sounds.  EXTREMITIES:  There is no clubbing, cyanosis, edema noted.  MUSCULOSKELETAL:  There is no joint deformity or  effusions and no spine or  CVA tenderness.  NEUROLOGIC:  She is alert and oriented with cranial nerves II-XII grossly  intact.   EKG is sinus rhythm, rate 93 with some possible early repolarization, but no  ischemic changes.   Laboratory values are pending at the time of dictation.   IMPRESSION:  [name] is a 75 year old female with a past medical  history of hyperlipidemia, hypothyroidism, hypertension who is here with  chest pain.  She has had atypical chest pain in the past with a negative  nuclear study December 2003.  She typically does not have dyspnea on  exertion, orthopnea, PND.  Her pedal edema is controlled with p.r.n. Lasix.  Over the past week she has had two separate episodes of chest pain that is  substernal and without radiation.  There were no associated symptoms.  The  duration was about three hours.  Her chest pain is atypical and she will be  admitted and myocardial infarction will be ruled out.  If her cardiac  enzymes are negative an outpatient Myoview is indicated.  We will continue  aspirin and add Lopressor 12.5 mg b.i.d. as well as heparin.  She will be  continued on her other medications and will check a TSH.      Theodore Demark, P.A. [facility]    ______________________________  Olga Millers, M.D. [facility]    RB/MEDQ  D:  10/03/2005  T:  10/03/2005  Job:  [id number]

## 2011-02-24 NOTE — Procedures (Signed)
[facility]  Patient:    [name], [name]               MRN: [id number] Adm. Date:  [id number] Disc. Date: [id number] Attending:  Louie Bun CC:         Stacie Acres. Cliffton Asters, M.D.   Procedure Report  PROCEDURE:  Colonoscopy.  ENDOSCOPIST:  Everardo All. Madilyn Fireman, M.D.  INDICATIONS:  Right lower quadrant abdominal pain in a patient with no prior colon screening.  DESCRIPTION OF PROCEDURE:  The patient was placed in the left lateral decubitus position and placed on the pulse monitor with continuous low flow oxygen delivered by nasal cannula.  She was sedated with 15 mg of IV Demerol and 6 mg of IV Versed.  The Olympus video colonoscope was inserted into the rectum and advanced to the cecum, confirmed by transillumination and McBurneys point and visualization of the ileocecal valve and appendiceal orifice.  The prep was excellent.  The cecum appeared normal.  In the ascending colon, there was a small, round sessile polyp approximately 8 mm in diameter which was fulgurated by hot biopsy.  The remainder of the ascending, transverse, descending and sigmoidoscope colon all appeared normal with no masses, polyps, diverticula or other mucosal abnormalities.  The rectum likewise appeared normal on retroflexed view.  The anus did reveal some small internal hemorrhoids.  The colonoscope was then withdrawn, and the patient returned to the recovery room in stable condition.  She tolerated the procedure well, and there were no immediate complications.  IMPRESSION:  Small ascending colon polyp.  Otherwise, normal colonoscopy.  PLAN: 1. Increase dicyclomine to 20 mg four times a day while awaiting histology. 2. Follow up in the office in six weeks regarding her abdominal pain. DD:  05/29/00 TD:  05/30/00 Job: [id number]

## 2011-02-24 NOTE — Discharge Summary (Signed)
[name], [name]        ACCOUNT NO.:  [id number]   MEDICAL RECORD NO.:  [id number]          PATIENT TYPE:  INP   LOCATION:  4707                         FACILITY:  [facility]   PHYSICIAN:  Olga Millers, M.D. LHCDATE OF BIRTH:  04/21/1936   DATE OF ADMISSION:  10/03/2005  DATE OF DISCHARGE:  10/04/2005                                 DISCHARGE SUMMARY   DISCHARGE DIAGNOSIS:  Chest pain.   SECONDARY DIAGNOSIS:  1.  Hypertension.  2.  Hyperlipidemia.  3.  Family history of coronary artery disease.  4.  History of atypical chest pain with a negative stress test in December      2003 and an ejection fraction of 75% at that time.  5.  History of lower extremity edema/venous insufficiency.  6.  Hypothyroid.  7.  Possible history of transient ischemic attack.  8.  Status post colonoscopy, cholecystectomy, appendectomy, hysterectomy,      bilateral hand surgery, bilateral shoulder surgery, and neck surgery.   ALLERGIES:  Codeine causes a headache.   PROCEDURES:  None.   HOSPITAL COURSE:  [name] is a 75 year old female with no known  history of coronary artery disease.  She had the onset of substernal chest  pain which started during minimal exertion.  She said it reached an 8/10 and  was associated with nausea and diaphoresis, but no shortness of breath.  It  radiated up both sides of her neck and to the left axilla and left arm.  She  said it was different from previous episodes of chest pain.  She had an  episode on the Tuesday prior to admission which resolved after taking a BC  Powder.  On the day of admission, she had not tried any medications and came  to the emergency room.  In the emergency room, approximately four hours  after symptom onset, she was having 5/10 chest pain.  Her EKG was not acute  and initial cardiac markers were negative.  She was admitted for further  evaluation and treatment.   Dr. Jens Som felt that her chest pain was atypical.  She was  started on  heparin overnight and treated with p.r.n. medications.  [name]'s  pain resolved.  Her cardiac enzymes were negative for MI.  Her EKG remained  stable.  She was evaluated by Dr. Jens Som on October 04, 2005, and  considered stable for discharge with an outpatient stress test and physician  extender visit.   LABORATORY DATA:  Total cholesterol 173, triglycerides 104, HDL 40, LDL 112.   DISCHARGE INSTRUCTIONS:  Her activity level is to be increased slowly.  She  is to stick to a low fat diet.  She is to get a stress test on January 2 at  9:15 and to follow up with Dr. Ludwig Clarks extender on January 9 at noon.  She is to follow up with Dr. Posey Rea and call for an appointment with him  if the Myoview is negative.   DISCHARGE MEDICATIONS:  1.  Aspirin 81 mg daily.  2.  Potassium 20 mEq 2 tabs b.i.d.  3.  Lasix 40 mg daily p.r.n.  4.  Prevacid 30  mg daily.  5.  Triamterene HCTZ 37.5/25 mg daily.  6.  Synthroid 0.05 mg daily.      Theodore Demark, P.A. [facility]    ______________________________  Olga Millers, M.D. [facility]    RB/MEDQ  D:  10/04/2005  T:  10/04/2005  Job:  [id number]   cc:   Georgina Quint. Plotnikov, M.D. [facility]  [address]  [place]  [place] [address]

## 2011-02-26 ENCOUNTER — Other Ambulatory Visit: Payer: Self-pay | Admitting: Internal Medicine

## 2011-03-25 ENCOUNTER — Emergency Department (HOSPITAL_BASED_OUTPATIENT_CLINIC_OR_DEPARTMENT_OTHER)
Admission: EM | Admit: 2011-03-25 | Discharge: 2011-03-25 | Disposition: A | Discharge status: Home / Self Care | Payer: Medicare Other | Attending: Emergency Medicine | Admitting: Emergency Medicine

## 2011-03-25 DIAGNOSIS — Z79899 Other long term (current) drug therapy: Secondary | ICD-10-CM | POA: Insufficient documentation

## 2011-03-25 DIAGNOSIS — E039 Hypothyroidism, unspecified: Secondary | ICD-10-CM | POA: Insufficient documentation

## 2011-03-25 DIAGNOSIS — T63461A Toxic effect of venom of wasps, accidental (unintentional), initial encounter: Secondary | ICD-10-CM | POA: Insufficient documentation

## 2011-03-25 DIAGNOSIS — F17200 Nicotine dependence, unspecified, uncomplicated: Secondary | ICD-10-CM | POA: Insufficient documentation

## 2011-03-25 DIAGNOSIS — T6391XA Toxic effect of contact with unspecified venomous animal, accidental (unintentional), initial encounter: Secondary | ICD-10-CM | POA: Insufficient documentation

## 2011-03-28 ENCOUNTER — Ambulatory Visit (INDEPENDENT_AMBULATORY_CARE_PROVIDER_SITE_OTHER): Payer: Medicare Other | Admitting: Internal Medicine

## 2011-03-28 ENCOUNTER — Encounter: Payer: Self-pay | Admitting: Internal Medicine

## 2011-03-28 DIAGNOSIS — R609 Edema, unspecified: Secondary | ICD-10-CM

## 2011-03-28 DIAGNOSIS — M545 Low back pain, unspecified: Secondary | ICD-10-CM

## 2011-03-28 DIAGNOSIS — R413 Other amnesia: Secondary | ICD-10-CM

## 2011-03-28 DIAGNOSIS — I739 Peripheral vascular disease, unspecified: Secondary | ICD-10-CM

## 2011-03-28 DIAGNOSIS — I10 Essential (primary) hypertension: Secondary | ICD-10-CM

## 2011-03-28 DIAGNOSIS — J4489 Other specified chronic obstructive pulmonary disease: Secondary | ICD-10-CM

## 2011-03-28 DIAGNOSIS — E785 Hyperlipidemia, unspecified: Secondary | ICD-10-CM

## 2011-03-28 DIAGNOSIS — J449 Chronic obstructive pulmonary disease, unspecified: Secondary | ICD-10-CM

## 2011-03-28 NOTE — Assessment & Plan Note (Signed)
On rx 

## 2011-03-28 NOTE — Progress Notes (Signed)
  Subjective:    Patient ID: [name], female    DOB: 11-16-1935, 75 y.o.   MRN: [id number]  HPI   The patient presents for a follow-up of  chronic hypertension, chronic dyslipidemia, PVD, memory loss controlled with medicines   Review of Systems  Constitutional: Negative for chills, activity change, appetite change, fatigue and unexpected weight change.  HENT: Negative for congestion, mouth sores and sinus pressure.   Eyes: Negative for visual disturbance.  Respiratory: Negative for cough and chest tightness.   Gastrointestinal: Negative for nausea and abdominal pain.  Genitourinary: Negative for frequency, difficulty urinating and vaginal pain.  Musculoskeletal: Negative for back pain and gait problem.  Skin: Negative for pallor and rash.  Neurological: Negative for dizziness, tremors, weakness, numbness and headaches.  Psychiatric/Behavioral: Negative for confusion and sleep disturbance.       Objective:   Physical Exam  Constitutional: She appears well-developed and well-nourished. No distress.  HENT:  Head: Normocephalic.  Right Ear: External ear normal.  Left Ear: External ear normal.  Nose: Nose normal.  Mouth/Throat: Oropharynx is clear and moist.  Eyes: Conjunctivae are normal. Pupils are equal, round, and reactive to light. Right eye exhibits no discharge. Left eye exhibits no discharge.  Neck: Normal range of motion. Neck supple. No JVD present. No tracheal deviation present. No thyromegaly present.  Cardiovascular: Normal rate, regular rhythm and normal heart sounds.   Pulmonary/Chest: No stridor. No respiratory distress. She has no wheezes.  Abdominal: Soft. Bowel sounds are normal. She exhibits no distension and no mass. There is no tenderness. There is no rebound and no guarding.  Musculoskeletal: She exhibits no edema and no tenderness.  Lymphadenopathy:    She has no cervical adenopathy.  Neurological: She displays normal reflexes. No cranial  nerve deficit. She exhibits normal muscle tone. Coordination normal.  Skin: No rash noted. No erythema.  Psychiatric: She has a normal mood and affect. Her behavior is normal. Judgment and thought content normal.          Assessment & Plan:

## 2011-03-28 NOTE — Assessment & Plan Note (Addendum)
On Rx Hold Zocor x  1 mo to see if better

## 2011-03-28 NOTE — Assessment & Plan Note (Signed)
On Rx 

## 2011-03-28 NOTE — Assessment & Plan Note (Signed)
Low BP lately

## 2011-06-24 ENCOUNTER — Other Ambulatory Visit: Payer: Self-pay | Admitting: Internal Medicine

## 2011-06-29 ENCOUNTER — Other Ambulatory Visit (INDEPENDENT_AMBULATORY_CARE_PROVIDER_SITE_OTHER): Payer: Medicare Other

## 2011-06-29 DIAGNOSIS — E785 Hyperlipidemia, unspecified: Secondary | ICD-10-CM

## 2011-06-29 DIAGNOSIS — R609 Edema, unspecified: Secondary | ICD-10-CM

## 2011-06-29 DIAGNOSIS — I10 Essential (primary) hypertension: Secondary | ICD-10-CM

## 2011-06-29 DIAGNOSIS — I739 Peripheral vascular disease, unspecified: Secondary | ICD-10-CM

## 2011-06-29 LAB — LIPID PANEL
Cholesterol: 130 mg/dL (ref 0–200)
HDL: 47.7 mg/dL (ref >39.00)
LDL Cholesterol: 67 mg/dL (ref 0–99)
Total CHOL/HDL Ratio: 3
Triglycerides: 75 mg/dL (ref 0.0–149.0)
VLDL: 15 mg/dL (ref 0.0–40.0)

## 2011-06-29 LAB — COMPREHENSIVE METABOLIC PANEL
Albumin: 3.9 g/dL (ref 3.5–5.2)
BUN: 13 mg/dL (ref 6–23)
Calcium: 8.9 mg/dL (ref 8.4–10.5)
Chloride: 99 mEq/L (ref 96–112)
Creatinine, Ser: 0.7 mg/dL (ref 0.4–1.2)
GFR: 87.04 mL/min (ref >60.00)
Glucose, Bld: 124 mg/dL — ABNORMAL HIGH (ref 70–99)
Potassium: 4.2 mEq/L (ref 3.5–5.1)

## 2011-06-29 LAB — TSH: TSH: 0.87 u[IU]/mL (ref 0.35–5.50)

## 2011-07-05 ENCOUNTER — Ambulatory Visit (INDEPENDENT_AMBULATORY_CARE_PROVIDER_SITE_OTHER): Payer: Medicare Other | Admitting: Internal Medicine

## 2011-07-05 ENCOUNTER — Encounter: Payer: Self-pay | Admitting: Gastroenterology

## 2011-07-05 ENCOUNTER — Encounter: Payer: Self-pay | Admitting: Internal Medicine

## 2011-07-05 VITALS — BP 118/68 | HR 76 | Temp 97.6°F | Resp 16 | Wt 114.0 lb

## 2011-07-05 DIAGNOSIS — R109 Unspecified abdominal pain: Secondary | ICD-10-CM

## 2011-07-05 DIAGNOSIS — R7309 Other abnormal glucose: Secondary | ICD-10-CM

## 2011-07-05 DIAGNOSIS — M545 Low back pain, unspecified: Secondary | ICD-10-CM

## 2011-07-05 DIAGNOSIS — I10 Essential (primary) hypertension: Secondary | ICD-10-CM

## 2011-07-05 DIAGNOSIS — E039 Hypothyroidism, unspecified: Secondary | ICD-10-CM

## 2011-07-05 DIAGNOSIS — R739 Hyperglycemia, unspecified: Secondary | ICD-10-CM

## 2011-07-05 DIAGNOSIS — R413 Other amnesia: Secondary | ICD-10-CM

## 2011-07-05 NOTE — Assessment & Plan Note (Signed)
Continue with current prescription therapy as reflected on the Med list.  

## 2011-07-05 NOTE — Assessment & Plan Note (Signed)
resolved 

## 2011-07-05 NOTE — Assessment & Plan Note (Signed)
monitoring

## 2011-07-05 NOTE — Assessment & Plan Note (Signed)
Continue with current prescription therapy as reflected on the Med list. Aricept may be aggravating diarrhea

## 2011-07-05 NOTE — Assessment & Plan Note (Signed)
GI consult

## 2011-07-05 NOTE — Progress Notes (Signed)
  Subjective:    Patient ID: [name], female    DOB: 11/26/1935, 75 y.o.   MRN: [id number]  HPI  The patient presents for a follow-up of  chronic hypertension, chronic dyslipidemia, COPD controlled with medicines C/o chronic diarrhea and abd pain at times x wks     Review of Systems  Constitutional: Negative for chills, activity change, appetite change, fatigue and unexpected weight change.  HENT: Negative for congestion, mouth sores and sinus pressure.   Eyes: Negative for visual disturbance.  Respiratory: Negative for cough and chest tightness.   Gastrointestinal: Positive for abdominal pain and diarrhea. Negative for nausea and blood in stool.  Genitourinary: Negative for frequency, difficulty urinating and vaginal pain.  Musculoskeletal: Negative for back pain and gait problem.  Skin: Negative for pallor and rash.  Neurological: Negative for dizziness, tremors, weakness, numbness and headaches.  Psychiatric/Behavioral: Negative for confusion and sleep disturbance. The patient is nervous/anxious.        Objective:   Physical Exam  Constitutional: She appears well-developed and well-nourished. No distress.  HENT:  Head: Normocephalic.  Right Ear: External ear normal.  Left Ear: External ear normal.  Nose: Nose normal.  Mouth/Throat: Oropharynx is clear and moist.  Eyes: Conjunctivae are normal. Pupils are equal, round, and reactive to light. Right eye exhibits no discharge. Left eye exhibits no discharge.  Neck: Normal range of motion. Neck supple. No JVD present. No tracheal deviation present. No thyromegaly present.  Cardiovascular: Normal rate, regular rhythm and normal heart sounds.   Pulmonary/Chest: No stridor. No respiratory distress. She has no wheezes.  Abdominal: Soft. Bowel sounds are normal. She exhibits no distension and no mass. There is no tenderness. There is no rebound and no guarding.  Musculoskeletal: She exhibits no edema and no tenderness.    Lymphadenopathy:    She has no cervical adenopathy.  Neurological: She displays normal reflexes. No cranial nerve deficit. She exhibits normal muscle tone. Coordination normal.  Skin: No rash noted. No erythema.  Psychiatric: She has a normal mood and affect. Her behavior is normal. Judgment and thought content normal.     Lab Results  Component Value Date   CHOL 130 06/29/2011   TRIG 75.0 06/29/2011   HDL 47.70 06/29/2011   ALT 22 06/29/2011   AST 23 06/29/2011   NA 138 06/29/2011   K 4.2 06/29/2011   CL 99 06/29/2011   CREATININE 0.7 06/29/2011   BUN 13 06/29/2011   CO2 33* 06/29/2011   TSH 0.87 06/29/2011   HGBA1C 5.8 01/19/2010        Assessment & Plan:

## 2011-07-07 ENCOUNTER — Ambulatory Visit: Admitting: Gastroenterology

## 2011-08-08 ENCOUNTER — Encounter: Payer: Self-pay | Admitting: Gastroenterology

## 2011-08-08 ENCOUNTER — Ambulatory Visit (INDEPENDENT_AMBULATORY_CARE_PROVIDER_SITE_OTHER): Payer: Medicare Other | Admitting: Gastroenterology

## 2011-08-08 ENCOUNTER — Ambulatory Visit: Payer: Medicare Other

## 2011-08-08 DIAGNOSIS — R197 Diarrhea, unspecified: Secondary | ICD-10-CM

## 2011-08-08 DIAGNOSIS — R195 Other fecal abnormalities: Secondary | ICD-10-CM

## 2011-08-08 DIAGNOSIS — R110 Nausea: Secondary | ICD-10-CM

## 2011-08-08 DIAGNOSIS — D649 Anemia, unspecified: Secondary | ICD-10-CM

## 2011-08-08 DIAGNOSIS — R112 Nausea with vomiting, unspecified: Secondary | ICD-10-CM | POA: Insufficient documentation

## 2011-08-08 DIAGNOSIS — R6889 Other general symptoms and signs: Secondary | ICD-10-CM

## 2011-08-08 DIAGNOSIS — R634 Abnormal weight loss: Secondary | ICD-10-CM

## 2011-08-08 DIAGNOSIS — R5381 Other malaise: Secondary | ICD-10-CM

## 2011-08-08 DIAGNOSIS — R5383 Other fatigue: Secondary | ICD-10-CM

## 2011-08-08 DIAGNOSIS — R109 Unspecified abdominal pain: Secondary | ICD-10-CM | POA: Insufficient documentation

## 2011-08-08 DIAGNOSIS — Z800 Family history of malignant neoplasm of digestive organs: Secondary | ICD-10-CM

## 2011-08-08 DIAGNOSIS — F0390 Unspecified dementia without behavioral disturbance: Secondary | ICD-10-CM

## 2011-08-08 LAB — BASIC METABOLIC PANEL
GFR: 108.1 mL/min (ref >60.00)
Potassium: 4.2 mEq/L (ref 3.5–5.1)
Sodium: 139 mEq/L (ref 135–145)

## 2011-08-08 LAB — CBC WITH DIFFERENTIAL/PLATELET
Eosinophils Relative: 0.5 % (ref 0.0–5.0)
HCT: 46.2 % — ABNORMAL HIGH (ref 36.0–46.0)
Lymphs Abs: 2.2 10*3/uL (ref 0.7–4.0)
MCV: 90.7 fl (ref 78.0–100.0)
Monocytes Absolute: 0.6 10*3/uL (ref 0.1–1.0)
Neutro Abs: 5.3 10*3/uL (ref 1.4–7.7)
Platelets: 169 10*3/uL (ref 150.0–400.0)
RDW: 12.1 % (ref 11.5–14.6)
WBC: 8.3 10*3/uL (ref 4.5–10.5)

## 2011-08-08 LAB — IBC PANEL
Iron: 75 ug/dL (ref 42–145)
Saturation Ratios: 19.6 % — ABNORMAL LOW (ref 20.0–50.0)
Transferrin: 272.9 mg/dL (ref 212.0–360.0)

## 2011-08-08 LAB — HEPATIC FUNCTION PANEL
AST: 22 U/L (ref 0–37)
Alkaline Phosphatase: 70 U/L (ref 39–117)
Total Bilirubin: 0.6 mg/dL (ref 0.3–1.2)

## 2011-08-08 LAB — TSH: TSH: 0.96 u[IU]/mL (ref 0.35–5.50)

## 2011-08-08 MED ORDER — PEG-KCL-NACL-NASULF-NA ASC-C 100 G PO SOLR: 1.0000 | Freq: Once | ORAL | Status: DC

## 2011-08-08 NOTE — Patient Instructions (Addendum)
You have been given a separate informational sheet regarding your tobacco use, the importance of quitting and local resources to help you quit. Your Endoscopy/Colonoscopy is scheduled on 08/09/2011 at 2pm You will go to the basement for labs today

## 2011-08-08 NOTE — Progress Notes (Signed)
History of Present Illness:  This is a extremely pleasant, mildly demented 75 year old Caucasian female referred for evaluation of vague abdominal pain, anorexia weight loss over the last year. This patient is followed closely by Dr. Posey Rea primary care for chronic thyroid dysfunction and peripheral edema and hyperlipidemia. Her GI issues the back many years, she was on the care of Dr. Terrial Rhodes , and has been on omeprazole for acid reflux for over 10 years. She currently describes vague diffuse abdominal pain described as a sharp tingling sensation lasting a few minutes in duration with associated nausea, dyspepsia, loss of appetite, and a 22 pound weight loss. She continues to have fairly normal bowel movements without melena or hematochezia. There is no history of hepatitis or pancreatitis, but the patient has had abdominal surgery many years ago for intestinal adhesions. She is also status post cholecystectomy and has a cervical plate for osteoporosis. Additionally the patient has had total abdominal hysterectomy.  I cannot say which she's had barium studies or endoscopic exams in many years. He describes several soft bowel movements a day as her normal pattern doubt symptoms do suggest steatorrhea. She does have a family history of colon cancer in her sister at age 69. There is a vague previous history of colon polyps in the patient, exam from 2004 was unremarkable. Review CT scan has shown evidence of diverticulosis, and she continues to smoke and has COPD and limited exercise tolerance.  I have reviewed this patient's present history, medical and surgical past history, allergies and medications.     ROS: The remainder of the 10 point ROS is negative... mild peripheral edema treated with when necessary Lasix, dyspnea with exertion but no symptoms of CHF or angina. Patient denies any specific food intolerances or lactose intolerance. There is no history of alcohol rinse it abuse.  Past Medical  History  Diagnosis Date  . GERD (gastroesophageal reflux disease)   . Hypertension   . COPD (chronic obstructive pulmonary disease)   . Hypothyroidism   . Peripheral vascular disease   . Hyperlipidemia   . Osteopenia   . Low back pain     OA R radiculopathy  . Lower extremity edema     chronic  . Personal history of colonic polyps 2001    TUBULAR ADENOMA - Madilyn Fireman   Past Surgical History  Procedure Date  . Abdominal hysterectomy   . Appendectomy   . Tubal ligation   . Cholecystectomy   . Carpal tunnel release     bilateral  . Rotator cuff repair     bilateral  . Neck surgery     bone and plate   . Abdominal adhesion surgery     reports that she has been smoking Cigarettes.  She has been smoking about 1 pack per day. She does not have any smokeless tobacco history on file. She reports that she does not drink alcohol or use illicit drugs. family history includes Colon cancer in her sister; Dementia in her mother; Heart disease in her father and sister; and Hypertension in her father. Allergies  Allergen Reactions  . Codeine Sulfate Other (See Comments)    Extreme headaches  . Oxycodone-Acetaminophen     Extreme headaches        Physical Exam: General well developed well nourished patient in no acute distress, appearing older than her stated age Eyes PERRLA, no icterus, fundoscopic exam per opthamologist Skin no lesions noted Neck supple, no adenopathy, no thyroid enlargement, no tenderness Chest clear to percussion  and auscultation Heart no significant murmurs, gallops or rubs noted Abdomen no hepatosplenomegaly masses or tenderness, BS normal. Multiple l well-healed surgical scars noted Rectal inspection normal no fissures, or fistulae noted.  No masses or tenderness on digital exam. Stool guaiac negative. Extremities no acute joint lesions, edema, phlebitis or evidence of cellulitis. Neurologic patient oriented x 3, cranial nerves intact, no focal neurologic  deficits noted. Psychological mental status normal and normal affect.  Assessment and plan: I am concerned about this patient's vague abdominal pain in 22 pound weight loss with a family history of colon carcinoma in her sister. Have scheduled her for colonoscopy and possible polypectomy. Also we will perform endoscopy because of her nausea and dyspepsia and obtain some screening laboratory parameters for review. She may need repeat abdominal CT scan. Other diagnostic considerations would include bacterial overgrowth syndrome in an elderly patient on chronic PPI therapy and a history of intestinal adhesions. Her dementia apparently has not been improved by Aricept therapy. Otherwise she is to continue her medications as listed and reviewed.  Encounter Diagnoses  Name Primary?  . Loose stools   . Abdominal pain   . Nausea   . Fatigue   . Anemia   . Weight loss   . Overflow diarrhea    . General symptoms, other    . General symptom

## 2011-08-09 ENCOUNTER — Ambulatory Visit (AMBULATORY_SURGERY_CENTER): Payer: Medicare Other | Admitting: Gastroenterology

## 2011-08-09 ENCOUNTER — Inpatient Hospital Stay (HOSPITAL_COMMUNITY)
Admission: EM | Admit: 2011-08-09 | Discharge: 2011-08-12 | DRG: 920 | Disposition: A | Discharge status: Home / Self Care | Payer: Medicare Other | Attending: Internal Medicine | Admitting: Internal Medicine

## 2011-08-09 ENCOUNTER — Telehealth: Payer: Self-pay | Admitting: *Deleted

## 2011-08-09 ENCOUNTER — Encounter: Payer: Self-pay | Admitting: Gastroenterology

## 2011-08-09 DIAGNOSIS — K219 Gastro-esophageal reflux disease without esophagitis: Secondary | ICD-10-CM | POA: Diagnosis present

## 2011-08-09 DIAGNOSIS — D62 Acute posthemorrhagic anemia: Secondary | ICD-10-CM | POA: Diagnosis present

## 2011-08-09 DIAGNOSIS — E039 Hypothyroidism, unspecified: Secondary | ICD-10-CM | POA: Diagnosis present

## 2011-08-09 DIAGNOSIS — D72829 Elevated white blood cell count, unspecified: Secondary | ICD-10-CM | POA: Diagnosis present

## 2011-08-09 DIAGNOSIS — E785 Hyperlipidemia, unspecified: Secondary | ICD-10-CM | POA: Diagnosis present

## 2011-08-09 DIAGNOSIS — F0390 Unspecified dementia without behavioral disturbance: Secondary | ICD-10-CM | POA: Diagnosis present

## 2011-08-09 DIAGNOSIS — D649 Anemia, unspecified: Secondary | ICD-10-CM

## 2011-08-09 DIAGNOSIS — J449 Chronic obstructive pulmonary disease, unspecified: Secondary | ICD-10-CM | POA: Diagnosis present

## 2011-08-09 DIAGNOSIS — J4489 Other specified chronic obstructive pulmonary disease: Secondary | ICD-10-CM | POA: Diagnosis present

## 2011-08-09 DIAGNOSIS — D126 Benign neoplasm of colon, unspecified: Secondary | ICD-10-CM

## 2011-08-09 DIAGNOSIS — I10 Essential (primary) hypertension: Secondary | ICD-10-CM | POA: Diagnosis present

## 2011-08-09 DIAGNOSIS — R634 Abnormal weight loss: Secondary | ICD-10-CM

## 2011-08-09 DIAGNOSIS — I872 Venous insufficiency (chronic) (peripheral): Secondary | ICD-10-CM | POA: Diagnosis present

## 2011-08-09 DIAGNOSIS — Y849 Medical procedure, unspecified as the cause of abnormal reaction of the patient, or of later complication, without mention of misadventure at the time of the procedure: Secondary | ICD-10-CM | POA: Diagnosis present

## 2011-08-09 DIAGNOSIS — IMO0002 Reserved for concepts with insufficient information to code with codable children: Principal | ICD-10-CM | POA: Diagnosis present

## 2011-08-09 DIAGNOSIS — K5730 Diverticulosis of large intestine without perforation or abscess without bleeding: Secondary | ICD-10-CM | POA: Insufficient documentation

## 2011-08-09 DIAGNOSIS — R195 Other fecal abnormalities: Secondary | ICD-10-CM

## 2011-08-09 DIAGNOSIS — R109 Unspecified abdominal pain: Secondary | ICD-10-CM

## 2011-08-09 LAB — GLIA (IGA/G) + TTG IGA: Gliadin IgA: 3.7 U/mL (ref <20)

## 2011-08-09 MED ORDER — ESOMEPRAZOLE MAGNESIUM 40 MG PO CPDR: DELAYED_RELEASE_CAPSULE | ORAL | Status: DC

## 2011-08-09 MED ORDER — SODIUM CHLORIDE 0.9 % IV SOLN: 500.0000 mL | INTRAVENOUS | Status: DC

## 2011-08-09 NOTE — Patient Instructions (Signed)
Please refer to the blue and neon green sheets for instructions regarding diet and activity for the rest of today. Please refer to the neon green sheet for instructions regarding activity for the rest of today.  Office will schedule CT SCAN. Contrast given now. Avoid all NSAIDS for the next 2 weeks. Resume all previous medications.

## 2011-08-09 NOTE — Telephone Encounter (Signed)
Gave pt samples of Dexilant and instructions for CT scan tomorrow and informed her I ordered her Nexium to take after the Dexilant; husband stated understanding.

## 2011-08-10 ENCOUNTER — Encounter (HOSPITAL_COMMUNITY): Payer: Self-pay

## 2011-08-10 ENCOUNTER — Telehealth: Payer: Self-pay

## 2011-08-10 ENCOUNTER — Telehealth: Payer: Self-pay | Admitting: *Deleted

## 2011-08-10 ENCOUNTER — Emergency Department (HOSPITAL_COMMUNITY): Payer: Medicare Other

## 2011-08-10 ENCOUNTER — Inpatient Hospital Stay: Admission: RE | Admit: 2011-08-10 | Payer: Medicare Other | Source: Ambulatory Visit

## 2011-08-10 DIAGNOSIS — D62 Acute posthemorrhagic anemia: Secondary | ICD-10-CM

## 2011-08-10 DIAGNOSIS — K921 Melena: Secondary | ICD-10-CM

## 2011-08-10 LAB — URINALYSIS, ROUTINE W REFLEX MICROSCOPIC
Bilirubin Urine: NEGATIVE
Glucose, UA: NEGATIVE mg/dL
Hgb urine dipstick: NEGATIVE
Leukocytes, UA: NEGATIVE
Nitrite: NEGATIVE
Protein, ur: NEGATIVE mg/dL
Specific Gravity, Urine: 1.024 (ref 1.005–1.030)
Urobilinogen, UA: 0.2 mg/dL (ref 0.0–1.0)
pH: 5 (ref 5.0–8.0)

## 2011-08-10 LAB — CBC
HCT: 41.8 % (ref 36.0–46.0)
HCT: 29.9 % — ABNORMAL LOW (ref 36.0–46.0)
HCT: 25.8 % — ABNORMAL LOW (ref 36.0–46.0)
Hemoglobin: 14.5 g/dL (ref 12.0–15.0)
Hemoglobin: 10.3 g/dL — ABNORMAL LOW (ref 12.0–15.0)
MCH: 30.6 pg (ref 26.0–34.0)
MCH: 30.5 pg (ref 26.0–34.0)
MCH: 30.8 pg (ref 26.0–34.0)
MCHC: 34.7 g/dL (ref 30.0–36.0)
MCV: 88.2 fL (ref 78.0–100.0)
MCV: 87.5 fL (ref 78.0–100.0)
Platelets: 161 10*3/uL (ref 150–400)
RBC: 4.74 MIL/uL (ref 3.87–5.11)
RBC: 3.38 MIL/uL — ABNORMAL LOW (ref 3.87–5.11)
RBC: 2.95 MIL/uL — ABNORMAL LOW (ref 3.87–5.11)
RDW: 12.2 % (ref 11.5–15.5)
RDW: 12.4 % (ref 11.5–15.5)
WBC: 11 10*3/uL — ABNORMAL HIGH (ref 4.0–10.5)
WBC: 6.6 10*3/uL (ref 4.0–10.5)

## 2011-08-10 LAB — COMPREHENSIVE METABOLIC PANEL
ALT: 18 U/L (ref 0–35)
AST: 19 U/L (ref 0–37)
Albumin: 3.6 g/dL (ref 3.5–5.2)
Alkaline Phosphatase: 67 U/L (ref 39–117)
BUN: 21 mg/dL (ref 6–23)
CO2: 23 mEq/L (ref 19–32)
Calcium: 9.1 mg/dL (ref 8.4–10.5)
Chloride: 105 mEq/L (ref 96–112)
Creatinine, Ser: 0.64 mg/dL (ref 0.50–1.10)
GFR calc Af Amer: >90 mL/min (ref >90)
GFR calc non Af Amer: 86 mL/min — ABNORMAL LOW (ref >90)
Glucose, Bld: 105 mg/dL — ABNORMAL HIGH (ref 70–99)
Potassium: 4.1 mEq/L (ref 3.5–5.1)
Sodium: 138 mEq/L (ref 135–145)
Total Bilirubin: 0.7 mg/dL (ref 0.3–1.2)
Total Protein: 6 g/dL (ref 6.0–8.3)

## 2011-08-10 LAB — DIFFERENTIAL
Basophils Absolute: 0.1 10*3/uL (ref 0.0–0.1)
Basophils Relative: 1 % (ref 0–1)
Eosinophils Absolute: 0.1 10*3/uL (ref 0.0–0.7)
Eosinophils Relative: 1 % (ref 0–5)
Lymphocytes Relative: 24 % (ref 12–46)
Lymphs Abs: 2.7 10*3/uL (ref 0.7–4.0)
Monocytes Absolute: 0.9 10*3/uL (ref 0.1–1.0)
Monocytes Relative: 8 % (ref 3–12)
Neutro Abs: 7.3 10*3/uL (ref 1.7–7.7)
Neutrophils Relative %: 66 % (ref 43–77)

## 2011-08-10 LAB — PROTIME-INR
INR: 1.15 (ref 0.00–1.49)
Prothrombin Time: 14.9 seconds (ref 11.6–15.2)

## 2011-08-10 LAB — ABO/RH: ABO/RH(D): O POS

## 2011-08-10 MED ORDER — IOHEXOL 350 MG/ML SOLN
100.0000 mL | Freq: Once | INTRAVENOUS | Status: AC | PRN
  Administered 2011-08-10: 100 mL via INTRAVENOUS

## 2011-08-10 NOTE — Telephone Encounter (Signed)
No ID on answering machine. 

## 2011-08-10 NOTE — Telephone Encounter (Signed)
I called Mr. [name] and explained that I had not awakened when paged twice this AM but did speak to answering service about his wife and knew he was taking her to ED. I expected that the ED would call me when she arrived but upon awakeining this AM discovered they had not. I called the RN caring for patient to check and had [name] [name] see the patient along with Dr. Arlyce Knapp, today.  I apologized for lack of attention via phone while on-call and he seemed satisfied with explanation. I also followed up with [name] and relayed that bleeding seems to have stopped for now and that watchful waiting was plan, with possible therapeutic colonoscopy.

## 2011-08-10 NOTE — Telephone Encounter (Signed)
Pt's husband called to inform us his wife is in room 1240 at [facility]. Pt started bleeding and passing clots yesterday after her COLON yesterday. He tried our on call number and never got a response so he took her to [facility] ER. I had left word for pt to call me to schedule an appt.  I will call back after her discharge from the hospital. Mr Vitiello can be reached at home at [phone] or on his cell at [phone].

## 2011-08-11 LAB — CBC
Hemoglobin: 8.4 g/dL — ABNORMAL LOW (ref 12.0–15.0)
MCH: 30.5 pg (ref 26.0–34.0)
MCH: 29.9 pg (ref 26.0–34.0)
MCHC: 33.9 g/dL (ref 30.0–36.0)
MCV: 89.5 fL (ref 78.0–100.0)
MCV: 88.3 fL (ref 78.0–100.0)
Platelets: 106 10*3/uL — ABNORMAL LOW (ref 150–400)
RBC: 2.75 MIL/uL — ABNORMAL LOW (ref 3.87–5.11)
RDW: 12.3 % (ref 11.5–15.5)

## 2011-08-11 LAB — BASIC METABOLIC PANEL
CO2: 23 mEq/L (ref 19–32)
Calcium: 7.7 mg/dL — ABNORMAL LOW (ref 8.4–10.5)
Creatinine, Ser: 0.6 mg/dL (ref 0.50–1.10)
Glucose, Bld: 92 mg/dL (ref 70–99)

## 2011-08-11 NOTE — Telephone Encounter (Signed)
Spoke with pt's husband this am and he stated she is doing better; she was transferred from CCU to a regular bed. Pt did not receive any blood products and is on a liquid diet. Mr. Pendergrass will call next week to schedule a f/u visit. Dr Jarold Motto informed.

## 2011-08-12 DIAGNOSIS — K922 Gastrointestinal hemorrhage, unspecified: Secondary | ICD-10-CM

## 2011-08-12 LAB — TYPE AND SCREEN
ABO/RH(D): O POS
Antibody Screen: NEGATIVE
Unit division: 0
Unit division: 0

## 2011-08-12 LAB — CBC
MCH: 29.4 pg (ref 26.0–34.0)
MCHC: 34.3 g/dL (ref 30.0–36.0)
MCV: 85.8 fL (ref 78.0–100.0)
Platelets: 104 10*3/uL — ABNORMAL LOW (ref 150–400)
RBC: 3.6 MIL/uL — ABNORMAL LOW (ref 3.87–5.11)

## 2011-08-12 MED ORDER — ACETAMINOPHEN 325 MG PO TABS: 650.0000 mg | ORAL_TABLET | ORAL | Status: DC | PRN

## 2011-08-12 MED ORDER — HYDROCODONE-ACETAMINOPHEN 5-325 MG PO TABS: 1.0000 | ORAL_TABLET | ORAL | Status: DC | PRN

## 2011-08-12 MED ORDER — ONDANSETRON HCL 4 MG PO TABS: 4.0000 mg | ORAL_TABLET | Freq: Four times a day (QID) | ORAL | Status: DC | PRN

## 2011-08-12 MED ORDER — SIMVASTATIN 40 MG PO TABS
40.0000 mg | ORAL_TABLET | Freq: Every day | ORAL | Status: DC
  Filled 2011-08-12 (×2): qty 1

## 2011-08-12 MED ORDER — CYCLOBENZAPRINE HCL 5 MG PO TABS
5.0000 mg | ORAL_TABLET | Freq: Three times a day (TID) | ORAL | Status: DC | PRN
  Filled 2011-08-12: qty 1

## 2011-08-12 MED ORDER — LEVOTHYROXINE SODIUM 50 MCG PO TABS
50.0000 ug | ORAL_TABLET | Freq: Every day | ORAL | Status: DC
  Filled 2011-08-12: qty 1

## 2011-08-12 MED ORDER — SODIUM CHLORIDE 0.9 % IV SOLN: INTRAVENOUS | Status: DC

## 2011-08-12 MED ORDER — ONDANSETRON HCL 4 MG/2ML IJ SOLN: 4.0000 mg | Freq: Four times a day (QID) | INTRAMUSCULAR | Status: DC | PRN

## 2011-08-12 MED ORDER — PANTOPRAZOLE SODIUM 40 MG PO TBEC: 40.0000 mg | DELAYED_RELEASE_TABLET | Freq: Every day | ORAL | Status: DC

## 2011-08-12 MED ORDER — PANTOPRAZOLE SODIUM 40 MG PO TBEC: 80.0000 mg | DELAYED_RELEASE_TABLET | Freq: Every day | ORAL | Status: DC

## 2011-08-12 MED ORDER — ALBUTEROL SULFATE (5 MG/ML) 0.5% IN NEBU
2.5000 mg | INHALATION_SOLUTION | RESPIRATORY_TRACT | Status: DC | PRN
  Filled 2011-08-12: qty 20

## 2011-08-12 MED ORDER — CALCIUM CARBONATE-VITAMIN D 250-125 MG-UNIT PO TABS
1.0000 | ORAL_TABLET | Freq: Every day | ORAL | Status: DC
  Filled 2011-08-12 (×2): qty 1

## 2011-08-12 NOTE — Discharge Summary (Signed)
[name], [name]        ACCOUNT NO.:  [id number]  MEDICAL RECORD NO.:  [id number]  LOCATION:  1412                         FACILITY:  [facility]  PHYSICIAN:  Manson Passey, MD        DATE OF BIRTH:  September 24, 1936  DATE OF ADMISSION:  08/09/2011 DATE OF DISCHARGE:                              DISCHARGE SUMMARY   DISCHARGE DIAGNOSES: 1. Acute blood loss anemia secondary to lower gastrointestinal bleed. 2. Lower gastrointestinal bleed secondary to polypectomy status post     colonoscopy. 3. Hypertension. 4. Hyperlipidemia. 5. Chronic venous insufficiency. 6. Hypothyroidism. 7. Dementia.  DISCHARGE MEDICATIONS: 1. Cyclobenzaprine 5 mg 3 times a day as needed. 2. Simvastatin 40 mg daily at bedtime. 3. Lasix 20 mg daily as needed for swelling. 4. Aspirin 81 mg daily. 5. Donepezil 10 mg daily. 6. Omeprazole 40 mg daily. 7. Calcium citrate with vitamin D  250/100 mg 1 tablet daily. 8. Potassium chloride 20 mEq 1 tablet twice a day. 9. Diclofenac 1.5% solution topical 3-5 drops 3 times a day. 10.Levothyroxine 50 mcg daily.  CONSULTATIONS: 1. [facility] [place].  DIAGNOSTIC STUDIES: 1. August 10, 2011, CT angio pelvis which shows nonocclusive     atherosclerotic calcification in the abdominal aorta and branch     vessels.  No aortic aneurysm or dissection.  Nonspecific filling     defect in the superior mesenteric vein, which likely represents     filling with unopacified blood.  No bowel distention.  DISCHARGE LABORATORIES:  White blood cells 7.5, hemoglobin 10.6, hematocrit 31, platelets 104.  Sodium 137, potassium 3.1.  Of note, repleted with K-Dur 40 mEq p.o., chloride 108, bicarb 23, glucose 92, BUN 10, creatinine 0.60.  TSH 0.96, folate more than 35, B12 at 491, ferritin 45, iron 75, transferrin 273.  HISTORY OF PRESENT ILLNESS:  The patient is a 75 year old female with past medical history of hypertension, hyperlipidemia, dementia, who comes in for lower GI bleed  secondary to colonoscopy status post polypectomy.  The patient's hemoglobin on admission was 14 and it dropped to 8.6.  The patient is status post 2 units of PRBC.  The patient was admitted to hospitalist service for further evaluation and management.  PHYSICAL EXAMINATION:  VITAL SIGNS:  Blood pressure 146/78, pulse 87, respirations 22, temperature 98.1 Fahrenheit, oxygen saturation 93% on room air.  GENERAL APPEARANCE:  No acute distress, the patient appears comfortable. SKIN:  Warm, dry. NECK:  Supple, no lymphadenopathy. HEENT:  Normocephalic/atraumatic, pupils equally round, reactive to light and accommodation, extraocular muscles intact, no tonsillar erythema or exudate. LUNGS:  Clear to auscultation bilaterally, no rhonchi, no wheezing, no rales. CARDIOVASCULAR:  Positive S1, S2.  Regular rate and rhythm.  ABDOMEN: Positive bowel sounds, nontender/nondistended.  EXTREMITIES:  Pulses palpable bilaterally.  No lower extremity edema. NEUROLOGIC:  Alert, awake, oriented  x3, no focal neurologic deficits.  HOSPITAL COURSE BY PROBLEM: 1. Acute blood loss anemia secondary to lower GI bleed.  The patient     was evaluated by  of GI and the recommendation was to follow     up with GI clinic.  The patient was transfused 2 units of blood and     the hemoglobin on discharge is 10.  The patient does not report any     more visible, gross blood in the stool.  There are no complaints of     dizziness or lightheadedness.  No chest pain, no shortness of     breath. 2. Lower GI bleed.  This is likely secondary to polypectomy.  The     patient as mentioned was evaluated by GI and there was no plan to     repeat colonoscopy unless active blood loss.  Hemoglobin and     hematocrit is stable at discharge.  The patient will follow up on     an outpatient basis with GI. 3. Hypertension.  The patient can continue same home medications once     discharged.  The blood pressure is at goal  146/78. 4. Dementia.  The patient has very mild dementia, she is alert, awake,     oriented  x3.  Please continue taking donepezil as home medication. 5. Advance directive full code.  EDUCATION:  The patient is aware of plan of care and treatment.  DISPOSITION:  The patient is medically stable and appears clinically well to be discharged home on August 12, 2011.  Time spent discharging the patient, more than 35 minutes.          ______________________________ Manson Passey, MD     AD/MEDQ  D:  08/12/2011  T:  08/12/2011  Job:  [id number]  Electronically Signed by Manson Passey MD on 08/12/2011 10:38:53 PM

## 2011-08-14 ENCOUNTER — Telehealth: Payer: Self-pay | Admitting: Gastroenterology

## 2011-08-14 NOTE — Telephone Encounter (Signed)
Next week is fine

## 2011-08-14 NOTE — Telephone Encounter (Signed)
Pt was discharged from the hospital on Saturday and was instructed to f/u with [place]. After her procedure she was to finish Dexilant and then use Nexium. Husband stated he will start pt on Dexilant tomorrow. Dr Jarold Motto, when does pt need to see you?  You do not have any openings after tomorrow until next week. Thanks.

## 2011-08-14 NOTE — Telephone Encounter (Signed)
Informed husband that Dr Jarold Motto will see pt on 08/22/11 at 10:45am; he stated understanding.

## 2011-08-15 ENCOUNTER — Encounter: Payer: Self-pay | Admitting: Gastroenterology

## 2011-08-16 ENCOUNTER — Telehealth: Payer: Self-pay | Admitting: Gastroenterology

## 2011-08-16 NOTE — H&P (Signed)
[name], CREMEENS NO.:  [id number]  MEDICAL RECORD NO.:  [id number]  LOCATION:  WLED                         FACILITY:  [facility]  PHYSICIAN:  Marinda Elk, M.D.DATE OF BIRTH:  06-28-1936  DATE OF ADMISSION:  08/09/2011 DATE OF DISCHARGE:                             HISTORY & PHYSICAL   PRIMARY CARE DOCTOR:  __________  CHIEF COMPLAINT:  Bright red blood per rectum.  This is a 75 year old female with past medical history of hypertension, hyperlipidemia with a negative stress test in 2006, currently on aspirins, comes in for lower GI bleed.  She relates she had a colonoscopy this morning.  A gut full of polyps removed.  Everything was okay until this afternoon when she started having bloody bowel movements.  It continued, but it progressively got worse __________.  So she decided to come here to the ED.  Here in the ED, she is not complaining of any abdominal pain.  She has had more than 25 bowel movements.  She just had 1 before me coming into the room.  It is bright red blood in the commode.  She relates no chest pain, shortness of breath, nausea, or vomiting, blurry vision, orthostatics.  No dizzy upon standing.  ALLERGIES:  CODEINE.  PAST MEDICAL HISTORY: 1. Hypertension. 2. Hyperlipidemia. 3. Chronic venous insufficiency. 4. Hypothyroidism. 5. Possible TIA. 6. Dementia.  MEDICATIONS:  She is on: 1. Benazepril 10 mg p.o. daily. 2. Omeprazole 20 mg daily. 3. K-Dur 20 mEq daily. 4. Synthroid 50 mcg daily. 5. Lasix 20 mg daily. 6. Diclofenac 1.5 mg solution topical t.i.d. 7. Cyclobenzaprine 5 mg t.i.d. 8. Calcium Caltrate 250 mg/100 mg p.o. daily. 9. Aspirin 81 mg daily. 10.Simvastatin 40 mg daily.  SOCIAL HISTORY:  She lives with her husband in [place].  She is a retired Airline pilot.  Used to smoke, about 40-pack-year smoking.  No alcohol or drugs.  FAMILY HISTORY:  Mother died at the age of 75 and her father died at the age of  39 of an MI.  REVIEW OF SYSTEMS:  10-point review of systems done, pertinent positives per HPI.  PHYSICAL EXAM:  VITAL SIGNS:  Temperature 97.4, pulse 95, blood pressure 118/85.  She is satting 97% on room air, breathing 20 times per minute. GENERAL:  She is awake, alert, and oriented x3.  No acute distress, lying in bed comfortably. HEENT:  Dry mucous membrane.  No JVD.  No bruits.  No thyromegaly. Anicteric.  No pallor.  Atraumatic, normocephalic. CARDIOVASCULAR:  She has a regular rate and rhythm with a positive S1 and S2.  She has a 2/6 systolic ejection murmur in the aortic area. LUNGS:  Good air movement.  Clear to auscultation.  ABDOMEN:  Positive bowel sounds.  Nontender, nondistended, and soft. EXTREMITIES:  Positive pulses.  No clubbing, cyanosis, or edema. SKIN:  She has no rashes or ulcerations.  She has multiple bruises in her legs and chronic pain __________changes. NEURO EXAM:  She is awake, alert, and oriented x3.  Coherent, fluent language, and nonfocal.  LABS ON ADMISSION:  Shows a UA completely clean except for ketones, which was trace.  Specific gravity of __________.  Sodium 138, potassium 4.1, chloride 105, bicarb 23, glucose 105,  BUN of 21, creatinine 0.6. LFTs within normal limits.  Her PT is 14, INR 1.1.  Her white count is 11.0 and hemoglobin of 14.5, platelet count of 165, MCV of 88, and ANC of 7.3.  CT angio of the abdomen done by the ED shows nonocclusive __________ calcification of the intraabdominal aorta.  No aneurysmic dissection.  Nonspecific defects in the superior mesenteric vein, which is likely representative of feeding opacity although thrombus is not excluded.  ASSESSMENT AND PLAN: 1. Acute lower gastrointestinal bleed probably secondary to procedure     today.  The patient had been taking aspirin for the past 5 days     before going into the procedure, so this is the most likely cause.     We will place 2 large bolus IV, will type and cross  1 unit,     transfuse is less than 7 or symptomatic.  I have already call Dr.     Jarold Motto of GI to come and see the patient.  We will admit her to     the telemetry unit and will put her on IV fluids. 2. Leukocytosis, probably stress demargination.  The patient denies     any cough, fevers, nausea, vomiting, any burning when she urinates,     so we will monitor her fevers. 3. Hypertension.  We will hold all her blood pressure medications and     continue to monitor her.     Marinda Elk, M.D.     AF/MEDQ  D:  08/10/2011  T:  08/10/2011  Job:  [id number]  Electronically Signed by Marinda Elk M.D. on 08/16/2011 05:54:23 PM

## 2011-08-16 NOTE — Telephone Encounter (Signed)
Husband called today.  He wants her seen by Dr. Jarold Motto tomorrow AM.  She is having swelling in feet.  No sob, no recurrent bleeding.  She took 2 lasix pills today.  I suspect she is mobilizing fluid given during her hosp stay for gi bleeding.    I told him I would pass word to DRP, husband is ok with any MD or PA but really wants his wife seen tomorrow for follow up of her hosp stay.

## 2011-08-17 ENCOUNTER — Telehealth: Payer: Self-pay | Admitting: *Deleted

## 2011-08-17 NOTE — Telephone Encounter (Signed)
Pt's husband called back and stated he "guesses" wife doesn't have to be seen today. He states she feels as though he feet have gone down and her stomach isn't hurting. Explained that I have a call in to Dr Plotnikov's CMA because of the swelling, but we need to know about the abdominal pain. I will call him when I hear from PCP, but please call us for other problems. Husband stated understanding.

## 2011-08-17 NOTE — Telephone Encounter (Signed)
Rec mess from Graciella Freer with Dr. Norval Gable office re: pt's edema. I called pt and spoke to her. She states she did take 2 lasix tabs yesterday and swelling has improved. She feels like she doesn't need to be seen by PCP at this time. She is scheduled to see Dr. Jarold Motto for f/u on her procedure. Please advise- does pt need to increase Lasix or f/u sooner?

## 2011-08-17 NOTE — Telephone Encounter (Signed)
Called Dr Plotnikov's CMA and lmom for her to call; can Dr Posey Rea see pt or advise her for swelling in her feet and legs? Spoke with pt who stated she's had problems with her feet swelling for years; she states they are better today. Pt reports she's been more active and on her feet more. Explained to pt I have a call in to Dr Posey Rea for an appt or advice on Lasix management, etc. Pt stated understanding.

## 2011-08-17 NOTE — Telephone Encounter (Signed)
OK to wait for next ov if the swelling is under control Thx

## 2011-08-17 NOTE — Telephone Encounter (Signed)
Stacey with Dr Posey Rea will call pt and/or husband and give instructions for her edema.

## 2011-08-17 NOTE — Telephone Encounter (Signed)
This is a chronic problem and 1 care should handle this.Marland KitchenMarland Kitchen

## 2011-08-18 NOTE — Telephone Encounter (Signed)
Patient notified per MD.

## 2011-08-22 ENCOUNTER — Other Ambulatory Visit (INDEPENDENT_AMBULATORY_CARE_PROVIDER_SITE_OTHER): Payer: Medicare Other

## 2011-08-22 ENCOUNTER — Telehealth: Payer: Self-pay | Admitting: Gastroenterology

## 2011-08-22 ENCOUNTER — Ambulatory Visit (INDEPENDENT_AMBULATORY_CARE_PROVIDER_SITE_OTHER): Payer: Medicare Other | Admitting: Gastroenterology

## 2011-08-22 ENCOUNTER — Encounter: Payer: Self-pay | Admitting: Gastroenterology

## 2011-08-22 DIAGNOSIS — R6889 Other general symptoms and signs: Secondary | ICD-10-CM

## 2011-08-22 DIAGNOSIS — D485 Neoplasm of uncertain behavior of skin: Secondary | ICD-10-CM

## 2011-08-22 DIAGNOSIS — R5381 Other malaise: Secondary | ICD-10-CM

## 2011-08-22 DIAGNOSIS — K869 Disease of pancreas, unspecified: Secondary | ICD-10-CM

## 2011-08-22 DIAGNOSIS — R634 Abnormal weight loss: Secondary | ICD-10-CM

## 2011-08-22 DIAGNOSIS — R5383 Other fatigue: Secondary | ICD-10-CM

## 2011-08-22 DIAGNOSIS — D379 Neoplasm of uncertain behavior of digestive organ, unspecified: Secondary | ICD-10-CM

## 2011-08-22 DIAGNOSIS — K8689 Other specified diseases of pancreas: Secondary | ICD-10-CM

## 2011-08-22 LAB — VITAMIN B12: Vitamin B-12: 538 pg/mL (ref 211–911)

## 2011-08-22 LAB — IBC PANEL: Iron: 43 ug/dL (ref 42–145)

## 2011-08-22 LAB — CBC WITH DIFFERENTIAL/PLATELET
Basophils Relative: 0.3 % (ref 0.0–3.0)
Eosinophils Relative: 0.7 % (ref 0.0–5.0)
HCT: 38.4 % (ref 36.0–46.0)
MCV: 89.3 fl (ref 78.0–100.0)
Monocytes Relative: 7.9 % (ref 3.0–12.0)
Neutrophils Relative %: 61.3 % (ref 43.0–77.0)
Platelets: 216 10*3/uL (ref 150.0–400.0)
RBC: 4.3 Mil/uL (ref 3.87–5.11)
WBC: 7 10*3/uL (ref 4.5–10.5)

## 2011-08-22 LAB — FERRITIN: Ferritin: 26.7 ng/mL (ref 10.0–291.0)

## 2011-08-22 LAB — CANCER ANTIGEN 19-9: CA 19-9: 4.3 U/mL (ref <35.0)

## 2011-08-22 LAB — SEDIMENTATION RATE: Sed Rate: 8 mm/hr (ref 0–22)

## 2011-08-22 NOTE — Progress Notes (Signed)
This is a 75 year old Caucasian thin female who continues with anorexia, weight loss, and vague persistent left upper quadrant pain radiating into her back over the last 6 months with a 30 pound weight loss. Recent endoscopy was unremarkable, colonoscopy showed multiple colon polyps which were removed, and the patient had a post polypectomy hemorrhage requiring hospitalization and a 2 unit transfusion. Labs otherwise have been unremarkable. She is on daily PPI therapy and also aspirin 81 mg a day. Her hospitalization she had CT angiography which showed some atherogenesis but no significant mesenteric arterial obstruction. I will speak to the radiologist to confirm whether or not CT showed good pancreatic imaging. She continues with frequent soft stools but no melena or hematochezia, systemic or specific hepatobiliary complaints.  Current Medications, Allergies, Past Medical History, Past Surgical History, Family History and Social History were reviewed in [employer] record.  Pertinent Review of Systems Negative.. persistent fatigue and anorexia with early satiety. Endoscopy showed no evidence of a gastric mass.   Physical Exam: Right labial appearing female in no acute distress. Chest is clear and she appears to be in a regular rhythm without murmurs gallops or rubs. Her abdomen is not distended and there is no organomegaly, masses, or localized tenderness. Bowel sounds are nonobstructed. Mental status shows that she is oriented x3.    Assessment and Plan: I am concerned this patient may have pancreatic malignancy accounting for her left upper quadrant pain, anorexia, and weight loss with otherwise negative workup. I have ordered repeat labs including CA 19-9 , and she may need CT scan dedicated to her pancreas. I have placed her on Nexium once a day without Prilosec. I will see her back in one month's time or when necessary as needed. Encounter Diagnoses  Name Primary?  .  Fatigue   . Neoplasm of uncertain behavior of skin   . Pancreatic mass   . Loss of weight   . General symptom    . General symptoms, other    . 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

## 2011-08-22 NOTE — Patient Instructions (Addendum)
You have been given a separate informational sheet regarding your tobacco use, the importance of quitting and local resources to help you quit. Please go to the basement upon leaving today to have your labs done. Return to see Dr. Jarold Motto in 1 month.

## 2011-08-22 NOTE — Telephone Encounter (Signed)
Pt's husband saw Pancreatic Mass on a problem list; and he wanted to discuss the OV. Explained to pt the labs order and the tumor marker and that we will call if she needs a CT scan; husband stated understanding.

## 2011-08-24 ENCOUNTER — Telehealth: Payer: Self-pay | Admitting: Gastroenterology

## 2011-08-25 NOTE — Telephone Encounter (Signed)
Informed [name] that the tumor marker, CA 19-9 is within normal limits; Dr Jarold Motto will have to  interpret the results. Husband stated understanding.

## 2011-08-28 ENCOUNTER — Other Ambulatory Visit: Payer: Self-pay | Admitting: *Deleted

## 2011-08-28 ENCOUNTER — Telehealth: Payer: Self-pay | Admitting: Gastroenterology

## 2011-08-28 MED ORDER — FUROSEMIDE 20 MG PO TABS: 40.0000 mg | ORAL_TABLET | Freq: Every day | ORAL | Status: DC

## 2011-08-28 MED ORDER — SIMVASTATIN 40 MG PO TABS: 40.0000 mg | ORAL_TABLET | Freq: Every day | ORAL | Status: DC

## 2011-08-28 MED ORDER — POTASSIUM CHLORIDE CRYS ER 20 MEQ PO TBCR: 20.0000 meq | EXTENDED_RELEASE_TABLET | Freq: Two times a day (BID) | ORAL | Status: DC

## 2011-08-28 MED ORDER — LEVOTHYROXINE SODIUM 50 MCG PO TABS: 50.0000 ug | ORAL_TABLET | Freq: Every day | ORAL | Status: DC

## 2011-08-28 NOTE — Telephone Encounter (Signed)
Discussed with Dr Jarold Motto CA- 19-9 will not be affected by transfusion.  Per Dr Jarold Motto pain is chronic and functional, no worrisome features.  Patient advised

## 2011-08-28 NOTE — Progress Notes (Signed)
Addended by: Merrilyn Puma on: 08/28/2011 11:00 AM   Modules accepted: Orders

## 2011-08-28 NOTE — Telephone Encounter (Signed)
Patient's husband wanted to know if the recent transfusions could affect the CA-19-9 level that was drawn.  Lab was drawn 10 days after transfusion, I have advised him very unlikely but would have Dr Jarold Motto advise.  Dr Jarold Motto she is also having the same complaints of abdominal pain.  Please advise on lab work and next step

## 2011-08-29 ENCOUNTER — Telehealth: Payer: Self-pay | Admitting: Gastroenterology

## 2011-08-29 NOTE — Telephone Encounter (Addendum)
Informed husband of information passed along from RN to pt yesterday. Mr Panico reports pt still has abdominal pain and diarrhea; some days are worse than other. He also stated if you were to ask her, pt would say she's OK. Pt has a f/u appt on 09/26/11, does she need to be seen earlier? Thanks.

## 2011-09-26 ENCOUNTER — Ambulatory Visit (INDEPENDENT_AMBULATORY_CARE_PROVIDER_SITE_OTHER): Payer: Medicare Other | Admitting: Gastroenterology

## 2011-09-26 ENCOUNTER — Other Ambulatory Visit: Payer: Self-pay | Admitting: *Deleted

## 2011-09-26 ENCOUNTER — Encounter: Payer: Self-pay | Admitting: Gastroenterology

## 2011-09-26 DIAGNOSIS — R109 Unspecified abdominal pain: Secondary | ICD-10-CM

## 2011-09-26 MED ORDER — OMEPRAZOLE 40 MG PO CPDR: 40.0000 mg | DELAYED_RELEASE_CAPSULE | Freq: Every day | ORAL | Status: DC

## 2011-09-26 NOTE — Progress Notes (Signed)
This is a very nice 75 year old Caucasian female again accompanied by her husband [name]. The patient continues to complain of vague mid abdominal pain radiating into her back without any precipitating or alleviating elements, hepatobiliary or systemic complaints. She did have some anorexia and weight loss, but her weight has improved steadily. The patient denies dysphagia, change in bowel habits, melena or hematochezia. Recent endoscopy and colonoscopies were unremarkable except for some colon polyps which were removed, and she did have some moderate post polypectomy bleeding. Meds include Aricept 10 mg a day for dementia Prilosec 40 mg every morning, and thyroid replacement medication and antihyperlipidemic medications. Extensive GI workup as per my clinic notes has been reviewed and is negative CT scan of the abdomen and mesenteric vascular examination on October 31. There was evidence of mild atherosclerosis. Recent CA-19-9 level was entirely normal, and her hemoglobin has risen to 13. Review of her labs shows no other specific abnormalities. She has had previous cholecystectomy 35 years ago.  Current Medications, Allergies, Past Medical History, Past Surgical History, Family History and Social History were reviewed in [employer] record.  Pertinent Review of Systems Negative   Physical Exam: Patient is awake and alert and oriented x3. She does appear to have short-term memory problems. Most of her interview was conducted through her husband. I cannot appreciate stigmata of chronic liver disease. Abdominal exam shows no distention, organomegaly, masses or tenderness. Bowel sounds are high pitched but not obstructive in nature, there is a well-healed right upper quadrant abdominal scar.    Assessment and Plan: Unexplained mid abdominal pain radiating to the back and a 75 year old patient with mild dementia. I remain concerned about the possibility of pancreatic cancer despite  her negative previous CT scan and CA 19-9 pancreatic tumor marker. We'll continue current medications, and repeat her CT scan dedicated to her pancreas in 6 weeks' time. Otherwise she is to continue medications as listed and reviewed. Encounter Diagnosis  Name Primary?  . Abdominal pain Yes

## 2011-09-26 NOTE — Patient Instructions (Addendum)
Make sure to come to the basement and have labs done on 11/06/2011, these labs must be done for you to have your CT scan.   You have been scheduled for a CT scan of the abdomen and pelvis at [facility] ([address]---this is in the same building as [facility]).   You are scheduled on 11/09/2011 at 9:30am. You should arrive 30 minutes prior to your appointment time for registration. Please follow the written instructions below on the day of your exam:  WARNING: IF YOU ARE ALLERGIC TO IODINE/X-RAY DYE, PLEASE NOTIFY RADIOLOGY IMMEDIATELY AT [phone]! YOU WILL BE GIVEN A 13 HOUR PREMEDICATION PREP.  1) Do not eat or drink anything after 5:30am (4 hours prior to your test)  You may take any medications as prescribed with a small amount of water except for the following: Metformin, Glucophage, Glucovance, Avandamet, Riomet, Fortamet, Actoplus Met, Janumet, Glumetza or Metaglip. The above medications must be held the day of the exam AND 48 hours after the exam.  The purpose of you drinking the oral contrast is to aid in the visualization of your intestinal tract. The contrast solution may cause some diarrhea. Before your exam is started, you will be given a small amount of fluid to drink. Depending on your individual set of symptoms, you may also receive an intravenous injection of x-ray contrast/dye. Plan on being at [facility] for 30 minutes or long, depending on the type of exam you are having performed.  If you have any questions regarding your exam or if you need to reschedule, you may call the CT department at [phone] between the hours of 8:00 am and 5:00 pm, Monday-Friday.  ________________________________________________________________________

## 2011-10-12 ENCOUNTER — Other Ambulatory Visit (INDEPENDENT_AMBULATORY_CARE_PROVIDER_SITE_OTHER): Payer: Medicare Other

## 2011-10-12 DIAGNOSIS — E039 Hypothyroidism, unspecified: Secondary | ICD-10-CM

## 2011-10-12 DIAGNOSIS — R413 Other amnesia: Secondary | ICD-10-CM | POA: Diagnosis not present

## 2011-10-12 DIAGNOSIS — M545 Low back pain: Secondary | ICD-10-CM | POA: Diagnosis not present

## 2011-10-12 DIAGNOSIS — R109 Unspecified abdominal pain: Secondary | ICD-10-CM

## 2011-10-12 DIAGNOSIS — R7309 Other abnormal glucose: Secondary | ICD-10-CM | POA: Diagnosis not present

## 2011-10-12 DIAGNOSIS — R739 Hyperglycemia, unspecified: Secondary | ICD-10-CM

## 2011-10-12 DIAGNOSIS — I10 Essential (primary) hypertension: Secondary | ICD-10-CM

## 2011-10-12 LAB — COMPREHENSIVE METABOLIC PANEL
ALT: 15 U/L (ref 0–35)
Albumin: 4.1 g/dL (ref 3.5–5.2)
CO2: 32 mEq/L (ref 19–32)
GFR: 73.49 mL/min (ref >60.00)
Potassium: 4.3 mEq/L (ref 3.5–5.1)
Sodium: 141 mEq/L (ref 135–145)
Total Bilirubin: 0.9 mg/dL (ref 0.3–1.2)
Total Protein: 6.7 g/dL (ref 6.0–8.3)

## 2011-10-12 LAB — CBC WITH DIFFERENTIAL/PLATELET
Basophils Absolute: 0 10*3/uL (ref 0.0–0.1)
Basophils Relative: 0.6 % (ref 0.0–3.0)
Eosinophils Relative: 1.4 % (ref 0.0–5.0)
HCT: 44.6 % (ref 36.0–46.0)
Hemoglobin: 15.2 g/dL — ABNORMAL HIGH (ref 12.0–15.0)
Lymphocytes Relative: 26.3 % (ref 12.0–46.0)
Lymphs Abs: 2 10*3/uL (ref 0.7–4.0)
Monocytes Relative: 6.7 % (ref 3.0–12.0)
Neutro Abs: 4.9 10*3/uL (ref 1.4–7.7)
RBC: 5.09 Mil/uL (ref 3.87–5.11)

## 2011-10-12 LAB — BASIC METABOLIC PANEL
CO2: 33 mEq/L — ABNORMAL HIGH (ref 19–32)
Calcium: 9.2 mg/dL (ref 8.4–10.5)
Creatinine, Ser: 0.7 mg/dL (ref 0.4–1.2)
GFR: 82.86 mL/min (ref >60.00)
Glucose, Bld: 124 mg/dL — ABNORMAL HIGH (ref 70–99)

## 2011-10-12 LAB — HEMOGLOBIN A1C: Hgb A1c MFr Bld: 5.8 % (ref 4.6–6.5)

## 2011-10-12 LAB — URINALYSIS
Ketones, ur: NEGATIVE
Leukocytes, UA: NEGATIVE
Specific Gravity, Urine: 1.02 (ref 1.000–1.030)
Total Protein, Urine: NEGATIVE
Urine Glucose: NEGATIVE
pH: 7 (ref 5.0–8.0)

## 2011-10-12 LAB — SEDIMENTATION RATE: Sed Rate: 6 mm/hr (ref 0–22)

## 2011-10-18 ENCOUNTER — Encounter: Payer: Self-pay | Admitting: Internal Medicine

## 2011-10-18 ENCOUNTER — Ambulatory Visit (INDEPENDENT_AMBULATORY_CARE_PROVIDER_SITE_OTHER): Payer: Medicare Other | Admitting: Internal Medicine

## 2011-10-18 DIAGNOSIS — I10 Essential (primary) hypertension: Secondary | ICD-10-CM

## 2011-10-18 DIAGNOSIS — R413 Other amnesia: Secondary | ICD-10-CM

## 2011-10-18 DIAGNOSIS — R109 Unspecified abdominal pain: Secondary | ICD-10-CM

## 2011-10-18 DIAGNOSIS — R634 Abnormal weight loss: Secondary | ICD-10-CM | POA: Diagnosis not present

## 2011-10-18 NOTE — Assessment & Plan Note (Signed)
See instructions

## 2011-10-18 NOTE — Assessment & Plan Note (Signed)
Wt Readings from Last 3 Encounters:  10/18/11 118 lb (53.524 kg)  09/26/11 116 lb (52.617 kg)  08/22/11 115 lb (52.164 kg)  Better

## 2011-10-18 NOTE — Assessment & Plan Note (Signed)
Hold simvastatin x 1 month: if not better - restart it and stop aricept

## 2011-10-18 NOTE — Progress Notes (Signed)
  Subjective:    Patient ID: [name], female    DOB: May 09, 1936, 76 y.o.   MRN: [id number]  HPI  The patient presents for a follow-up of  Chronic abd pain and poor appetite hypertension, chronic dyslipidemia, dementia    Review of Systems  Constitutional: Negative for chills, activity change, appetite change, fatigue and unexpected weight change.  HENT: Negative for congestion, mouth sores and sinus pressure.   Eyes: Negative for visual disturbance.  Respiratory: Negative for cough, chest tightness and shortness of breath.   Gastrointestinal: Positive for nausea and abdominal distention. Negative for abdominal pain.  Genitourinary: Negative for frequency, difficulty urinating and vaginal pain.  Musculoskeletal: Negative for myalgias, back pain and gait problem.  Skin: Negative for pallor and rash.  Neurological: Negative for dizziness, tremors, weakness, numbness and headaches.  Psychiatric/Behavioral: Negative for suicidal ideas, confusion and sleep disturbance. The patient is nervous/anxious.        Objective:   Physical Exam  Constitutional: She appears well-developed. No distress.  HENT:  Head: Normocephalic.  Right Ear: External ear normal.  Left Ear: External ear normal.  Nose: Nose normal.  Mouth/Throat: Oropharynx is clear and moist.  Eyes: Conjunctivae are normal. Pupils are equal, round, and reactive to light. Right eye exhibits no discharge. Left eye exhibits no discharge.  Neck: Normal range of motion. Neck supple. No JVD present. No tracheal deviation present. No thyromegaly present.  Cardiovascular: Normal rate, regular rhythm and normal heart sounds.   Pulmonary/Chest: No stridor. No respiratory distress. She has no wheezes.  Abdominal: Soft. Bowel sounds are normal. She exhibits no distension and no mass. There is no tenderness. There is no rebound and no guarding.  Musculoskeletal: She exhibits no edema and no tenderness.  Lymphadenopathy:    She  has no cervical adenopathy.  Neurological: She displays normal reflexes. No cranial nerve deficit. She exhibits normal muscle tone. Coordination normal.  Skin: No rash noted. No erythema.  Psychiatric: Her behavior is normal. Judgment and thought content normal.    CT, labs and colon reviewed      Assessment & Plan:

## 2011-10-18 NOTE — Patient Instructions (Signed)
Hold simvastatin x 1 month: if not better - restart it and stop aricept 

## 2011-10-18 NOTE — Assessment & Plan Note (Signed)
Continue with current prescription therapy as reflected on the Med list.  

## 2011-11-07 ENCOUNTER — Telehealth: Payer: Self-pay | Admitting: Gastroenterology

## 2011-11-07 NOTE — Telephone Encounter (Signed)
Pt lost her AVS i went over instructions again with family member

## 2011-11-09 ENCOUNTER — Other Ambulatory Visit: Payer: Self-pay | Admitting: Gastroenterology

## 2011-11-09 ENCOUNTER — Ambulatory Visit (INDEPENDENT_AMBULATORY_CARE_PROVIDER_SITE_OTHER)
Admission: RE | Admit: 2011-11-09 | Discharge: 2011-11-09 | Disposition: A | Discharge status: Home / Self Care | Payer: Medicare Other | Source: Ambulatory Visit | Attending: Gastroenterology | Admitting: Gastroenterology

## 2011-11-09 DIAGNOSIS — R109 Unspecified abdominal pain: Secondary | ICD-10-CM | POA: Diagnosis not present

## 2011-11-09 DIAGNOSIS — K5730 Diverticulosis of large intestine without perforation or abscess without bleeding: Secondary | ICD-10-CM | POA: Diagnosis not present

## 2011-11-09 DIAGNOSIS — R634 Abnormal weight loss: Secondary | ICD-10-CM | POA: Diagnosis not present

## 2011-11-09 MED ORDER — IOHEXOL 300 MG/ML  SOLN
100.0000 mL | Freq: Once | INTRAMUSCULAR | Status: AC | PRN
  Administered 2011-11-09: 100 mL via INTRAVENOUS

## 2011-11-15 ENCOUNTER — Telehealth: Payer: Self-pay | Admitting: Gastroenterology

## 2011-11-15 NOTE — Telephone Encounter (Signed)
Pt wanted results of her December scan. Informed her Ct results were normal; pt stated understanding.

## 2011-12-19 ENCOUNTER — Other Ambulatory Visit: Payer: Self-pay

## 2011-12-19 MED ORDER — POTASSIUM CHLORIDE CRYS ER 20 MEQ PO TBCR: 20.0000 meq | EXTENDED_RELEASE_TABLET | Freq: Two times a day (BID) | ORAL | Status: DC

## 2011-12-19 MED ORDER — FUROSEMIDE 20 MG PO TABS: 40.0000 mg | ORAL_TABLET | Freq: Every day | ORAL | Status: DC

## 2011-12-19 MED ORDER — OMEPRAZOLE 40 MG PO CPDR: 40.0000 mg | DELAYED_RELEASE_CAPSULE | Freq: Every day | ORAL | Status: DC

## 2011-12-19 MED ORDER — DONEPEZIL HCL 10 MG PO TABS: 10.0000 mg | ORAL_TABLET | Freq: Every evening | ORAL | Status: DC | PRN

## 2011-12-19 MED ORDER — LEVOTHYROXINE SODIUM 50 MCG PO TABS: 50.0000 ug | ORAL_TABLET | Freq: Every day | ORAL | Status: DC

## 2011-12-19 MED ORDER — SIMVASTATIN 40 MG PO TABS: 40.0000 mg | ORAL_TABLET | Freq: Every day | ORAL | Status: DC

## 2011-12-19 NOTE — Telephone Encounter (Signed)
REceived faxed refill request from [facility]

## 2011-12-29 ENCOUNTER — Telehealth: Payer: Self-pay

## 2011-12-29 MED ORDER — SYNTHROID 50 MCG PO TABS: 50.0000 ug | ORAL_TABLET | Freq: Every day | ORAL | Status: DC

## 2011-12-29 NOTE — Telephone Encounter (Signed)
Pt's spouse called requesting thyroid medication be re-prescibed for brand name only. Rx-sent,spouse aware.

## 2012-01-03 ENCOUNTER — Other Ambulatory Visit: Payer: Self-pay

## 2012-01-03 ENCOUNTER — Telehealth: Payer: Self-pay | Admitting: *Deleted

## 2012-01-03 MED ORDER — SYNTHROID 50 MCG PO TABS: 50.0000 ug | ORAL_TABLET | Freq: Every day | ORAL | Status: DC

## 2012-01-03 NOTE — Telephone Encounter (Signed)
Pt's husband calling stating we need to call [facility] at [phone] ext [phone] re: rx we sent for Synthroid. I called number and tried to speak to a live person. Reached vm and left detailed mess requesting call back to resolve this matter.

## 2012-01-03 NOTE — Telephone Encounter (Signed)
Fax requesting clarification on synthroid Rx sent 03/22, Rx updated and resent.

## 2012-01-04 NOTE — Telephone Encounter (Signed)
Please see previous refill note,matter resolved.

## 2012-01-17 ENCOUNTER — Ambulatory Visit (INDEPENDENT_AMBULATORY_CARE_PROVIDER_SITE_OTHER): Payer: Medicare Other | Admitting: Internal Medicine

## 2012-01-17 ENCOUNTER — Encounter: Payer: Self-pay | Admitting: Internal Medicine

## 2012-01-17 VITALS — BP 148/80 | HR 80 | Temp 98.1°F | Resp 16 | Wt 120.0 lb

## 2012-01-17 DIAGNOSIS — R110 Nausea: Secondary | ICD-10-CM

## 2012-01-17 DIAGNOSIS — R413 Other amnesia: Secondary | ICD-10-CM | POA: Diagnosis not present

## 2012-01-17 DIAGNOSIS — I10 Essential (primary) hypertension: Secondary | ICD-10-CM

## 2012-01-17 DIAGNOSIS — E785 Hyperlipidemia, unspecified: Secondary | ICD-10-CM

## 2012-01-17 DIAGNOSIS — I6529 Occlusion and stenosis of unspecified carotid artery: Secondary | ICD-10-CM

## 2012-01-17 DIAGNOSIS — R634 Abnormal weight loss: Secondary | ICD-10-CM

## 2012-01-17 DIAGNOSIS — R109 Unspecified abdominal pain: Secondary | ICD-10-CM

## 2012-01-17 MED ORDER — RIVASTIGMINE TARTRATE 3 MG PO CAPS: 3.0000 mg | ORAL_CAPSULE | Freq: Two times a day (BID) | ORAL | Status: DC

## 2012-01-17 NOTE — Assessment & Plan Note (Signed)
Continue with current prescription therapy as reflected on the Med list.  

## 2012-01-17 NOTE — Assessment & Plan Note (Signed)
Resolved off aricept 

## 2012-01-17 NOTE — Progress Notes (Signed)
Patient ID: [name], female   DOB: [birth year], 76 y.o.   MRN: [id number]  Subjective:    Patient ID: [name], female    DOB: 05/01/1936, 76 y.o.   MRN: [id number]  HPI  The patient presents for a follow-up of  Chronic abd pain and poor appetite hypertension, chronic dyslipidemia, dementia. GI sx's resolved off Aricept  Wt Readings from Last 3 Encounters:  01/17/12 120 lb (54.432 kg)  10/18/11 118 lb (53.524 kg)  09/26/11 116 lb (52.617 kg)   BP Readings from Last 3 Encounters:  01/17/12 148/80  10/18/11 140/70  09/26/11 120/60      Review of Systems  Constitutional: Negative for chills, activity change, appetite change, fatigue and unexpected weight change.  HENT: Negative for congestion, mouth sores and sinus pressure.   Eyes: Negative for visual disturbance.  Respiratory: Negative for cough, chest tightness and shortness of breath.   Gastrointestinal: Negative for nausea, abdominal pain and abdominal distention.  Genitourinary: Negative for frequency, difficulty urinating and vaginal pain.  Musculoskeletal: Negative for myalgias, back pain and gait problem.  Skin: Negative for pallor and rash.  Neurological: Negative for dizziness, tremors, weakness, numbness and headaches.  Psychiatric/Behavioral: Negative for suicidal ideas, confusion and sleep disturbance. The patient is nervous/anxious.        Objective:   Physical Exam  Constitutional: She appears well-developed. No distress.  HENT:  Head: Normocephalic.  Right Ear: External ear normal.  Left Ear: External ear normal.  Nose: Nose normal.  Mouth/Throat: Oropharynx is clear and moist.  Eyes: Conjunctivae are normal. Pupils are equal, round, and reactive to light. Right eye exhibits no discharge. Left eye exhibits no discharge.  Neck: Normal range of motion. Neck supple. No JVD present. No tracheal deviation present. No thyromegaly present.  Cardiovascular: Normal rate, regular rhythm and  normal heart sounds.   Pulmonary/Chest: No stridor. No respiratory distress. She has no wheezes.  Abdominal: Soft. Bowel sounds are normal. She exhibits no distension and no mass. There is no tenderness. There is no rebound and no guarding.  Musculoskeletal: She exhibits no edema and no tenderness.  Lymphadenopathy:    She has no cervical adenopathy.  Neurological: She displays normal reflexes. No cranial nerve deficit. She exhibits normal muscle tone. Coordination normal.  Skin: No rash noted. No erythema.  Psychiatric: Her behavior is normal. Judgment and thought content normal.    CT, labs and colon reviewed      Assessment & Plan:

## 2012-01-17 NOTE — Assessment & Plan Note (Signed)
Resolved off Aricept

## 2012-01-17 NOTE — Assessment & Plan Note (Signed)
Will try Exelon caps

## 2012-05-15 ENCOUNTER — Encounter: Payer: Self-pay | Admitting: Internal Medicine

## 2012-05-15 ENCOUNTER — Ambulatory Visit (INDEPENDENT_AMBULATORY_CARE_PROVIDER_SITE_OTHER): Payer: Medicare Other | Admitting: Internal Medicine

## 2012-05-15 VITALS — BP 142/90 | HR 84 | Temp 96.6°F | Resp 16 | Wt 120.0 lb

## 2012-05-15 DIAGNOSIS — F0390 Unspecified dementia without behavioral disturbance: Secondary | ICD-10-CM

## 2012-05-15 DIAGNOSIS — K219 Gastro-esophageal reflux disease without esophagitis: Secondary | ICD-10-CM

## 2012-05-15 DIAGNOSIS — E039 Hypothyroidism, unspecified: Secondary | ICD-10-CM

## 2012-05-15 DIAGNOSIS — R413 Other amnesia: Secondary | ICD-10-CM

## 2012-05-15 NOTE — Assessment & Plan Note (Signed)
Continue with current prescription therapy as reflected on the Med list.  

## 2012-05-15 NOTE — Assessment & Plan Note (Signed)
Better lately 

## 2012-05-15 NOTE — Patient Instructions (Signed)
Hold your Simvastatin, Calcium, multivitamin - they can cause nausea

## 2012-05-15 NOTE — Progress Notes (Signed)
   Subjective:    Patient ID: [name], female    DOB: [birth year], 76 y.o.   MRN: [id number]  HPI  The patient presents for a follow-up of  chronic abd pain and poor appetite, hypertension, chronic dyslipidemia, dementia. GI sx's better off Aricept. Chronic L foot swelling  Wt Readings from Last 3 Encounters:  05/15/12 120 lb (54.432 kg)  01/17/12 120 lb (54.432 kg)  10/18/11 118 lb (53.524 kg)   BP Readings from Last 3 Encounters:  05/15/12 142/90  01/17/12 148/80  10/18/11 140/70      Review of Systems  Constitutional: Negative for chills, activity change, appetite change, fatigue and unexpected weight change.  HENT: Negative for congestion, mouth sores and sinus pressure.   Eyes: Negative for visual disturbance.  Respiratory: Negative for cough, chest tightness and shortness of breath.   Gastrointestinal: Negative for nausea, abdominal pain and abdominal distention.  Genitourinary: Negative for frequency, difficulty urinating and vaginal pain.  Musculoskeletal: Negative for myalgias, back pain and gait problem.  Skin: Negative for pallor and rash.  Neurological: Negative for dizziness, tremors, weakness, numbness and headaches.  Psychiatric/Behavioral: Negative for suicidal ideas, confusion and disturbed wake/sleep cycle. The patient is nervous/anxious.        Objective:   Physical Exam  Constitutional: She appears well-developed. No distress.  HENT:  Head: Normocephalic.  Right Ear: External ear normal.  Left Ear: External ear normal.  Nose: Nose normal.  Mouth/Throat: Oropharynx is clear and moist.  Eyes: Conjunctivae are normal. Pupils are equal, round, and reactive to light. Right eye exhibits no discharge. Left eye exhibits no discharge.  Neck: Normal range of motion. Neck supple. No JVD present. No tracheal deviation present. No thyromegaly present.  Cardiovascular: Normal rate, regular rhythm and normal heart sounds.   Pulmonary/Chest: No stridor.  No respiratory distress. She has no wheezes.  Abdominal: Soft. Bowel sounds are normal. She exhibits no distension and no mass. There is no tenderness. There is no rebound and no guarding.  Musculoskeletal: She exhibits edema (L foot 1+, R -). She exhibits no tenderness.  Lymphadenopathy:    She has no cervical adenopathy.  Neurological: She displays normal reflexes. No cranial nerve deficit. She exhibits normal muscle tone. Coordination normal.  Skin: No rash noted. No erythema.  Psychiatric: Her behavior is normal. Judgment and thought content normal.    CT, labs and colon reviewed      Assessment & Plan:

## 2012-05-20 ENCOUNTER — Other Ambulatory Visit (INDEPENDENT_AMBULATORY_CARE_PROVIDER_SITE_OTHER): Payer: Medicare Other

## 2012-05-20 DIAGNOSIS — E785 Hyperlipidemia, unspecified: Secondary | ICD-10-CM

## 2012-05-20 DIAGNOSIS — R634 Abnormal weight loss: Secondary | ICD-10-CM | POA: Diagnosis not present

## 2012-05-20 DIAGNOSIS — R110 Nausea: Secondary | ICD-10-CM

## 2012-05-20 DIAGNOSIS — R413 Other amnesia: Secondary | ICD-10-CM

## 2012-05-20 DIAGNOSIS — I10 Essential (primary) hypertension: Secondary | ICD-10-CM

## 2012-05-20 LAB — HEPATIC FUNCTION PANEL
Albumin: 4.1 g/dL (ref 3.5–5.2)
Alkaline Phosphatase: 67 U/L (ref 39–117)
Bilirubin, Direct: 0.2 mg/dL (ref 0.0–0.3)

## 2012-05-20 LAB — LIPID PANEL
HDL: 45.7 mg/dL (ref >39.00)
Total CHOL/HDL Ratio: 3
Triglycerides: 82 mg/dL (ref 0.0–149.0)
VLDL: 16.4 mg/dL (ref 0.0–40.0)

## 2012-05-20 LAB — BASIC METABOLIC PANEL
Calcium: 9.4 mg/dL (ref 8.4–10.5)
Creatinine, Ser: 0.8 mg/dL (ref 0.4–1.2)
GFR: 78.96 mL/min (ref >60.00)
Sodium: 141 mEq/L (ref 135–145)

## 2012-05-21 ENCOUNTER — Telehealth: Payer: Self-pay | Admitting: Internal Medicine

## 2012-05-21 DIAGNOSIS — E875 Hyperkalemia: Secondary | ICD-10-CM

## 2012-05-21 NOTE — Telephone Encounter (Signed)
[name], please, inform patient that all labs are normal except for elev potassium Stop potassium. BMET in 1 mo Thx

## 2012-05-21 NOTE — Telephone Encounter (Signed)
Left message on machine for pt to return my call  

## 2012-05-22 NOTE — Telephone Encounter (Signed)
Pt informed of lab results and order for BMET placed.

## 2012-08-02 ENCOUNTER — Encounter: Payer: Self-pay | Admitting: Gastroenterology

## 2012-08-07 ENCOUNTER — Encounter: Payer: Self-pay | Admitting: Gastroenterology

## 2012-08-12 ENCOUNTER — Telehealth: Payer: Self-pay | Admitting: Gastroenterology

## 2012-08-12 ENCOUNTER — Encounter: Payer: Self-pay | Admitting: *Deleted

## 2012-08-12 NOTE — Telephone Encounter (Signed)
Pt seen last year in December for lower abdominal pain on L side radiating to her back. She had a negative CT scan, but Dr Jarold Motto states in 09/26/11 OV he thinks she has Pancreatic cancer, but it's not showing up. Pt's husband reports today the pain is back again. The pain can start on the L or R side and then radiates to her back; right now the pain is across her entire abdomen and back. She reports being quessy and has no appetite. Pt has an appt in am for Mike Gip, PA. She also has a PV on 08/19/12 and a COLON on 09/02/12.

## 2012-08-13 ENCOUNTER — Ambulatory Visit (INDEPENDENT_AMBULATORY_CARE_PROVIDER_SITE_OTHER): Payer: Medicare Other | Admitting: Physician Assistant

## 2012-08-13 ENCOUNTER — Encounter: Payer: Self-pay | Admitting: Physician Assistant

## 2012-08-13 VITALS — BP 128/82 | HR 88 | Ht 61.0 in | Wt 120.8 lb

## 2012-08-13 DIAGNOSIS — Z1211 Encounter for screening for malignant neoplasm of colon: Secondary | ICD-10-CM | POA: Diagnosis not present

## 2012-08-13 DIAGNOSIS — R109 Unspecified abdominal pain: Secondary | ICD-10-CM | POA: Diagnosis not present

## 2012-08-13 DIAGNOSIS — R110 Nausea: Secondary | ICD-10-CM

## 2012-08-13 DIAGNOSIS — Z86010 Personal history of colonic polyps: Secondary | ICD-10-CM

## 2012-08-13 MED ORDER — PEG-KCL-NACL-NASULF-NA ASC-C 100 G PO SOLR: 1.0000 | Freq: Once | ORAL | Status: DC

## 2012-08-13 MED ORDER — MOVIPREP 100 G PO SOLR: 1.0000 | Freq: Once | ORAL | Status: AC

## 2012-08-13 NOTE — Progress Notes (Signed)
Subjective:    Patient ID: [name], female    DOB: August 28, 1936, 76 y.o.   MRN: [id number]  HPI [name] is a pleasant 76 year old white female known to Dr. Jarold Motto with history of dementia, hypertension, hyperlipidemia, carotid artery stenosis, and COPD. She status post cholecystectomy. She has history of diverticulosis and adenomatous colon polyps. She had undergone workup in October of 2012 for complaints of lower Donald pain weight loss and nausea and had CT scan of the abdomen and pelvis including angiogram which was negative for any occlusive vascular disease. EGD showed a 3 cm hiatal hernia large rugal folds. Biopsies were negative. Colonoscopy showed moderate diverticulosis and multiple polyps in the right colon 5-20 mm in size each were flat villous spreading polyps x4. Path on these polyps consistent with 4 tubular adenomas no high-grade dysplasia She is due for followup colonoscopy now. Last year decision was made that Aricept may have been causing some of her GI symptoms she was eventually switched to Exelon. Her husband says that her symptoms have really never resolved and she continues to complain intermittently of lower abdominal discomfort and generally has a poor appetite and some intermittent queasiness. Looking at her weights though her weight has been very stable over the past year currently at 120, and in December 2012 weight was 116. Patient says her lower abdominal discomfort is a dull ache and radiates around into the center of her back, she also gets pain on the right side sometimes it radiates down into the anterior right leg. She says the pain is not severe, more annoying. There is no change with position that she is aware of, no change with by mouth intake. She says her bowel habits are normal she has not noted any melena or hematochezia.  When asked about disc disease and back problems the patient says that she hasn't had any problems that she is aware of....  however review of her records shows an MRI from 2004 of the lumbar spine that shows multilevel broad-based disc bulges and mild foraminal narrowing .    Review of Systems  Constitutional: Positive for activity change.  HENT: Negative.   Eyes: Negative.   Respiratory: Negative.   Cardiovascular: Negative.   Gastrointestinal: Positive for abdominal pain.  Genitourinary: Negative.   Musculoskeletal: Positive for back pain.  Neurological: Negative.   Psychiatric/Behavioral: Negative.    Outpatient Prescriptions Prior to Visit  Medication Sig Dispense Refill  . aspirin 81 MG EC tablet Take 81 mg by mouth daily.       . Cholecalciferol (VITAMIN D3) 1000 UNITS CAPS Take 1 capsule by mouth daily.        . cyclobenzaprine (FLEXERIL) 5 MG tablet Take 5 mg by mouth 3 (three) times daily as needed. For spasms      . Diclofenac Sodium (PENNSAID) 1.5 % SOLN Place 3-5 drops onto the skin 3 (three) times daily as needed. For pain applies on joints      . furosemide (LASIX) 20 MG tablet Take 40 mg by mouth daily as needed.      Marland Kitchen omeprazole (PRILOSEC) 40 MG capsule Take 1 capsule (40 mg total) by mouth daily.  90 capsule  3  . rivastigmine (EXELON) 3 MG capsule Take 1 capsule (3 mg total) by mouth 2 (two) times daily.  180 capsule  3  . simvastatin (ZOCOR) 40 MG tablet Take 1 tablet (40 mg total) by mouth at bedtime.  90 tablet  3  . SYNTHROID 50 MCG tablet Take  1 tablet (50 mcg total) by mouth daily.  90 tablet  3  . Multiple Vitamins-Minerals (MULTIVITAMIN PO) Take 1 tablet by mouth daily.        . potassium chloride SA (K-DUR,KLOR-CON) 20 MEQ tablet Take 1 tablet (20 mEq total) by mouth 2 (two) times daily.  180 tablet  3   Allergies  Allergen Reactions  . Aricept (Donepezil Hydrochloride)     abd pain  . Codeine Sulfate Other (See Comments)    Extreme headaches  . Oxycodone-Acetaminophen Other (See Comments)    Extreme headaches   Patient Active Problem List  Diagnosis  . NEOPLASM, SKIN,  UNCERTAIN BEHAVIOR  . HYPOTHYROIDISM  . HYPERLIPIDEMIA  . SMOKER  . HYPERTENSION  . CAROTID ARTERY STENOSIS, BILATERAL  . PERIPHERAL VASCULAR DISEASE  . Acute Bronchitis  . COPD, NOS  . GERD  . HIP PAIN, RIGHT  . BACK PAIN  . HAND PAIN  . OSTEOPENIA  . OTHER MALAISE AND FATIGUE  . Memory loss  . Edema  . CHEST PAIN, UNSPECIFIED  . IMPAIRED FASTING GLUCOSE  . MIGRAINES, HX OF  . Family history of malignant neoplasm of gastrointestinal tract  . Unexplained weight loss  . Nausea  . Abdominal pain  . Dementia  . Benign neoplasm of colon  . GERD (gastroesophageal reflux disease)  . Diverticulosis of colon (without mention of hemorrhage)  . Loss of weight  . Fatigue   History  Substance Use Topics  . Smoking status: Current Every Day Smoker -- 1.0 packs/day    Types: Cigarettes  . Smokeless tobacco: Never Used     Comment: Sheet given on smoking  . Alcohol Use: No       Objective:   Physical Exam well-developed elderly white female in no acute distress, pleasant accompanied by her husband blood pressure 120/82 pulse 88 height 5 foot 1 weight 120. HEENT; nontraumatic normocephalic EOMI PERRLA sclera anicteric,Neck; Supple no JVD, Cardiovascular; regular rate and rhythm with S1-S2 no murmur or gallop, Pulmonary; clear bilaterally, Abdomen; soft, bowel sounds are active she is tender very lateral lead in the left abdomen over the iliac crest , no real appreciable abdominal tenderness no guarding or rebound. Rectal; exam not done, Extremities; no clubbing cyanosis or edema skin warm and dry, Psych; mood and affect normal and appropriate.        Assessment & Plan:  #63  76 year old female due for one-year followup colonoscopy with history of multiple tubular adenomas on colonoscopy one year ago, right colon. #2 persistent complaint of low-grade lower abdominal pain radiating around into the left back. GI workup 1 year ago was unrevealing as to the etiology of this pain. I  suspect that she has radicular pain from of progressive lumbar disc disease. #3 vague complaints of nausea and poor appetite, no associated weight loss over the past one year. She is on Exelon for her dementia which can be associated with multiple GI side effects, and is likely contributing to her symptoms #4 dementia #5 GERD #6 hypertension  Plan; schedule patient for colonoscopy with Dr. Jarold Motto, procedure discussed in detail with patient and her husband and they are agreeable to proceed. Continue Prilosec 40 mg by mouth daily Would consider repeat MRI of the lumbar spine, after colonoscopy completed to reassess her complaints of lower left abdominal pain and back pain as I suspect is radicular in nature.

## 2012-08-13 NOTE — Patient Instructions (Addendum)
You have been scheduled for a colonoscopy with propofol. Please follow written instructions given to you at your visit today.  Please pick up your prep kit at the pharmacy within the next 1-3 days. We sent the prescription for the colonoscopy prep to [facility] [address]. If you use inhalers (even only as needed) or a CPAP machine, please bring them with you on the day of your procedure.

## 2012-08-19 ENCOUNTER — Ambulatory Visit: Payer: Medicare Other | Admitting: Internal Medicine

## 2012-08-19 ENCOUNTER — Ambulatory Visit (AMBULATORY_SURGERY_CENTER): Payer: Medicare Other | Admitting: Gastroenterology

## 2012-08-19 ENCOUNTER — Encounter: Payer: Medicare Other | Admitting: Gastroenterology

## 2012-08-19 ENCOUNTER — Encounter: Payer: Self-pay | Admitting: Gastroenterology

## 2012-08-19 VITALS — BP 150/81 | HR 78 | Temp 97.3°F | Resp 20 | Ht 61.0 in | Wt 120.0 lb

## 2012-08-19 DIAGNOSIS — K5730 Diverticulosis of large intestine without perforation or abscess without bleeding: Secondary | ICD-10-CM

## 2012-08-19 DIAGNOSIS — R110 Nausea: Secondary | ICD-10-CM

## 2012-08-19 DIAGNOSIS — Z86010 Personal history of colonic polyps: Secondary | ICD-10-CM | POA: Diagnosis not present

## 2012-08-19 DIAGNOSIS — Z1211 Encounter for screening for malignant neoplasm of colon: Secondary | ICD-10-CM

## 2012-08-19 DIAGNOSIS — R109 Unspecified abdominal pain: Secondary | ICD-10-CM | POA: Diagnosis not present

## 2012-08-19 DIAGNOSIS — D126 Benign neoplasm of colon, unspecified: Secondary | ICD-10-CM

## 2012-08-19 MED ORDER — SODIUM CHLORIDE 0.9 % IV SOLN: 500.0000 mL | INTRAVENOUS | Status: DC

## 2012-08-19 NOTE — Op Note (Signed)
[facility] [address] [place], [address]   COLONOSCOPY PROCEDURE REPORT  PATIENT: [name], [name]  MR#: [id number] BIRTHDATE: 1936/02/26 , 76  yrs. old GENDER: Female ENDOSCOPIST: Mardella Layman, MD, [facility] REFERRED BY: PROCEDURE DATE:  08/19/2012 PROCEDURE:   Colonoscopy with snare polypectomy ASA CLASS:   Class III INDICATIONS:patient's personal history of adenomatous colon polyps.  MEDICATIONS: propofol (Diprivan) 200mg  IV  DESCRIPTION OF PROCEDURE:   After the risks and benefits and of the procedure were explained, informed consent was obtained.  A digital rectal exam revealed no abnormalities of the rectum.    The LB [id number]-[id number]  endoscope was introduced through the anus and advanced to the cecum, which was identified by both the appendix and ileocecal valve .  The quality of the prep was excellent, using MoviPrep .  The instrument was then slowly withdrawn as the colon was fully examined.     COLON FINDINGS: Two smooth sessile polyps ranging between 3-49mm in size were found at the hepatic flexure.  A polypectomy was performed using snare cautery.  The resection was complete and the polyp tissue was completely retrieved.   Moderate diverticulosis was noted in the descending colon and sigmoid colon. A 1cm flat sigmoid polyp hot snare removed,#2 jar.    Retroflexed views revealed no abnormalities.     The scope was then withdrawn from the patient and the procedure completed.  COMPLICATIONS: There were no complications. ENDOSCOPIC IMPRESSION: 1.   Two sessile polyps ranging between 3-48mm in size were found at the hepatic flexure; polypectomy was performed using snare cautery ,also sigmoid polyp excised... 2.   Moderate diverticulosis was noted in the descending colon and sigmoid colon  RECOMMENDATIONS: 1.  Await biopsy results 2.  High fiber diet with liberal fluid intake. 3.  Repeat Colonoscopy in 3 years.   REPEAT  EXAM:  cc:  _______________________________ eSignedMardella Layman, MD, [facility] 08/19/2012 11:22 AM

## 2012-08-19 NOTE — Patient Instructions (Addendum)

## 2012-08-19 NOTE — Progress Notes (Signed)
No complaints noted in the recovery room. Maw  Patient did not experience any of the following events: a burn prior to discharge; a fall within the facility; wrong site/side/patient/procedure/implant event; or a hospital transfer or hospital admission upon discharge from the facility. (G8907) Patient did not have preoperative order for IV antibiotic SSI prophylaxis. (G8918)  

## 2012-08-20 ENCOUNTER — Ambulatory Visit: Payer: Medicare Other | Admitting: Internal Medicine

## 2012-08-20 ENCOUNTER — Telehealth: Payer: Self-pay | Admitting: *Deleted

## 2012-08-20 NOTE — Telephone Encounter (Signed)
  Follow up Call-  Call back number 08/19/2012 08/09/2011  Post procedure Call Back phone  # [phone] hm [phone]  Permission to leave phone message Yes -     Patient questions:  Do you have a fever, pain , or abdominal swelling? no Pain Score  0 *  Have you tolerated food without any problems? yes  Have you been able to return to your normal activities? yes  Do you have any questions about your discharge instructions: Diet   no Medications  no Follow up visit  no  Do you have questions or concerns about your Care? no  Actions: * If pain score is 4 or above: No action needed, pain <4. Husband provided information, was concerned about waiting 3 years to repeat exam.  Informed husband that could change we needed to get path results back                     And the doctor will make decision at that time.

## 2012-08-21 ENCOUNTER — Encounter: Payer: Self-pay | Admitting: Internal Medicine

## 2012-08-21 ENCOUNTER — Ambulatory Visit (INDEPENDENT_AMBULATORY_CARE_PROVIDER_SITE_OTHER): Payer: Medicare Other | Admitting: Internal Medicine

## 2012-08-21 ENCOUNTER — Other Ambulatory Visit (INDEPENDENT_AMBULATORY_CARE_PROVIDER_SITE_OTHER): Payer: Medicare Other

## 2012-08-21 VITALS — BP 142/70 | HR 72 | Temp 96.8°F | Ht 61.0 in | Wt 119.5 lb

## 2012-08-21 DIAGNOSIS — E785 Hyperlipidemia, unspecified: Secondary | ICD-10-CM

## 2012-08-21 DIAGNOSIS — M549 Dorsalgia, unspecified: Secondary | ICD-10-CM

## 2012-08-21 DIAGNOSIS — R634 Abnormal weight loss: Secondary | ICD-10-CM | POA: Diagnosis not present

## 2012-08-21 DIAGNOSIS — I10 Essential (primary) hypertension: Secondary | ICD-10-CM

## 2012-08-21 DIAGNOSIS — Z23 Encounter for immunization: Secondary | ICD-10-CM

## 2012-08-21 DIAGNOSIS — F0390 Unspecified dementia without behavioral disturbance: Secondary | ICD-10-CM

## 2012-08-21 LAB — HEPATIC FUNCTION PANEL
AST: 21 U/L (ref 0–37)
Alkaline Phosphatase: 60 U/L (ref 39–117)
Bilirubin, Direct: 0.2 mg/dL (ref 0.0–0.3)
Total Bilirubin: 0.9 mg/dL (ref 0.3–1.2)

## 2012-08-21 LAB — BASIC METABOLIC PANEL
Chloride: 99 mEq/L (ref 96–112)
GFR: 85.36 mL/min (ref >60.00)
Potassium: 3.6 mEq/L (ref 3.5–5.1)
Sodium: 139 mEq/L (ref 135–145)

## 2012-08-21 LAB — URINALYSIS
Ketones, ur: NEGATIVE
Leukocytes, UA: NEGATIVE
Nitrite: NEGATIVE
Specific Gravity, Urine: 1.01 (ref 1.000–1.030)
Total Protein, Urine: NEGATIVE
pH: 6 (ref 5.0–8.0)

## 2012-08-21 NOTE — Assessment & Plan Note (Signed)
Wt Readings from Last 3 Encounters:  08/21/12 119 lb 8 oz (54.205 kg)  08/19/12 120 lb (54.432 kg)  08/13/12 120 lb 12.8 oz (54.795 kg)   Watching

## 2012-08-21 NOTE — Assessment & Plan Note (Addendum)
Continue with current prescription therapy as reflected on the Med list - Exelon.

## 2012-08-21 NOTE — Assessment & Plan Note (Signed)
Continue with current prescription therapy as reflected on the Med list.  UA 

## 2012-08-21 NOTE — Assessment & Plan Note (Signed)
Continue with current prescription therapy as reflected on the Med list.  

## 2012-08-21 NOTE — Progress Notes (Signed)
   Subjective:    Patient ID: [name], female    DOB: 09-19-1936, 76 y.o.   MRN: [id number]  HPI  The patient presents for a follow-up of  chronic abd pain and poor appetite, hypertension, chronic dyslipidemia, dementia. GI sx's better off Aricept. No foot swelling  Wt Readings from Last 3 Encounters:  08/21/12 119 lb 8 oz (54.205 kg)  08/19/12 120 lb (54.432 kg)  08/13/12 120 lb 12.8 oz (54.795 kg)   BP Readings from Last 3 Encounters:  08/21/12 142/70  08/19/12 150/81  08/13/12 128/82      Review of Systems  Constitutional: Negative for chills, activity change, appetite change, fatigue and unexpected weight change.  HENT: Negative for congestion, mouth sores and sinus pressure.   Eyes: Negative for visual disturbance.  Respiratory: Negative for cough, chest tightness and shortness of breath.   Gastrointestinal: Negative for nausea, abdominal pain and abdominal distention.  Genitourinary: Negative for frequency, difficulty urinating and vaginal pain.  Musculoskeletal: Negative for myalgias, back pain and gait problem.  Skin: Negative for pallor and rash.  Neurological: Negative for dizziness, tremors, weakness, numbness and headaches.  Psychiatric/Behavioral: Negative for suicidal ideas, confusion and sleep disturbance. The patient is nervous/anxious.        Objective:   Physical Exam  Constitutional: She appears well-developed. No distress.  HENT:  Head: Normocephalic.  Right Ear: External ear normal.  Left Ear: External ear normal.  Nose: Nose normal.  Mouth/Throat: Oropharynx is clear and moist.  Eyes: Conjunctivae normal are normal. Pupils are equal, round, and reactive to light. Right eye exhibits no discharge. Left eye exhibits no discharge.  Neck: Normal range of motion. Neck supple. No JVD present. No tracheal deviation present. No thyromegaly present.  Cardiovascular: Normal rate, regular rhythm and normal heart sounds.   Pulmonary/Chest: No  stridor. No respiratory distress. She has no wheezes.  Abdominal: Soft. Bowel sounds are normal. She exhibits no distension and no mass. There is no tenderness. There is no rebound and no guarding.  Musculoskeletal: She exhibits edema (L foot 1+, R -). She exhibits no tenderness.  Lymphadenopathy:    She has no cervical adenopathy.  Neurological: She displays normal reflexes. No cranial nerve deficit. She exhibits normal muscle tone. Coordination normal.  Skin: No rash noted. No erythema.  Psychiatric: Her behavior is normal. Judgment and thought content normal.    Lab Results  Component Value Date   WBC 7.5 10/12/2011   HGB 15.2* 10/12/2011   HCT 44.6 10/12/2011   PLT 148.0* 10/12/2011   GLUCOSE 121* 05/20/2012   CHOL 132 05/20/2012   TRIG 82.0 05/20/2012   HDL 45.70 05/20/2012   LDLCALC 70 05/20/2012   ALT 20 05/20/2012   AST 25 05/20/2012   NA 141 05/20/2012   K 5.7* 05/20/2012   CL 103 05/20/2012   CREATININE 0.8 05/20/2012   BUN 18 05/20/2012   CO2 33* 05/20/2012   TSH 1.04 10/12/2011   INR 1.15 08/10/2011   HGBA1C 5.8 10/12/2011         Assessment & Plan:

## 2012-08-23 ENCOUNTER — Encounter: Payer: Self-pay | Admitting: Gastroenterology

## 2012-09-02 ENCOUNTER — Encounter: Payer: Medicare Other | Admitting: Gastroenterology

## 2012-10-11 ENCOUNTER — Other Ambulatory Visit: Payer: Self-pay | Admitting: Internal Medicine

## 2012-10-12 ENCOUNTER — Other Ambulatory Visit: Payer: Self-pay | Admitting: Internal Medicine

## 2012-10-22 ENCOUNTER — Other Ambulatory Visit: Payer: Self-pay | Admitting: *Deleted

## 2012-10-22 MED ORDER — OMEPRAZOLE 40 MG PO CPDR: 40.0000 mg | DELAYED_RELEASE_CAPSULE | Freq: Every day | ORAL | Status: DC

## 2012-10-22 MED ORDER — SYNTHROID 50 MCG PO TABS: 50.0000 ug | ORAL_TABLET | Freq: Every day | ORAL | Status: DC

## 2012-10-22 NOTE — Telephone Encounter (Signed)
[facility] husband. Omeprazole and synthroid.

## 2012-11-25 ENCOUNTER — Ambulatory Visit (INDEPENDENT_AMBULATORY_CARE_PROVIDER_SITE_OTHER): Payer: Medicare Other | Admitting: Internal Medicine

## 2012-11-25 ENCOUNTER — Encounter: Payer: Self-pay | Admitting: Internal Medicine

## 2012-11-25 ENCOUNTER — Other Ambulatory Visit: Payer: Self-pay | Admitting: Internal Medicine

## 2012-11-25 VITALS — BP 130/82 | HR 80 | Temp 96.9°F | Resp 16 | Wt 122.0 lb

## 2012-11-25 DIAGNOSIS — J449 Chronic obstructive pulmonary disease, unspecified: Secondary | ICD-10-CM | POA: Diagnosis not present

## 2012-11-25 DIAGNOSIS — I10 Essential (primary) hypertension: Secondary | ICD-10-CM | POA: Diagnosis not present

## 2012-11-25 DIAGNOSIS — R413 Other amnesia: Secondary | ICD-10-CM

## 2012-11-25 DIAGNOSIS — L570 Actinic keratosis: Secondary | ICD-10-CM | POA: Diagnosis not present

## 2012-11-25 DIAGNOSIS — F17200 Nicotine dependence, unspecified, uncomplicated: Secondary | ICD-10-CM

## 2012-11-25 DIAGNOSIS — K219 Gastro-esophageal reflux disease without esophagitis: Secondary | ICD-10-CM

## 2012-11-25 DIAGNOSIS — J4489 Other specified chronic obstructive pulmonary disease: Secondary | ICD-10-CM

## 2012-11-25 DIAGNOSIS — E039 Hypothyroidism, unspecified: Secondary | ICD-10-CM

## 2012-11-25 DIAGNOSIS — J441 Chronic obstructive pulmonary disease with (acute) exacerbation: Secondary | ICD-10-CM | POA: Insufficient documentation

## 2012-11-25 DIAGNOSIS — F0390 Unspecified dementia without behavioral disturbance: Secondary | ICD-10-CM

## 2012-11-25 NOTE — Assessment & Plan Note (Signed)
Continue with current prescription therapy as reflected on the Med list.  

## 2012-11-25 NOTE — Patient Instructions (Signed)
   Postprocedure instructions :     Keep the wounds clean. You can wash them with liquid soap and water. Pat dry with gauze or a Kleenex tissue  Before applying antibiotic ointment and a Band-Aid.   You need to report immediately  if  any signs of infection develop.    

## 2012-11-25 NOTE — Assessment & Plan Note (Signed)
On Lasix/Kdur

## 2012-11-25 NOTE — Assessment & Plan Note (Signed)
E cigs option was discussed

## 2012-11-25 NOTE — Assessment & Plan Note (Signed)
Continue with current prescription therapy as reflected on the Med list. Mild GI problems w/Exelon - tolerable

## 2012-11-25 NOTE — Assessment & Plan Note (Signed)
Face - see procedure

## 2012-11-25 NOTE — Assessment & Plan Note (Signed)
Discussed.

## 2012-11-25 NOTE — Progress Notes (Signed)
Subjective:     HPI  The patient presents for a follow-up of  chronic abd pain and poor appetite, hypertension, chronic dyslipidemia, dementia. GI sx's better off Aricept. No foot swelling  Wt Readings from Last 3 Encounters:  11/25/12 122 lb (55.339 kg)  08/21/12 119 lb 8 oz (54.205 kg)  08/19/12 120 lb (54.432 kg)   BP Readings from Last 3 Encounters:  11/25/12 130/82  08/21/12 142/70  08/19/12 150/81      Review of Systems  Constitutional: Negative for chills, activity change, appetite change, fatigue and unexpected weight change.  HENT: Negative for congestion, mouth sores and sinus pressure.   Eyes: Negative for visual disturbance.  Respiratory: Negative for cough, chest tightness and shortness of breath.   Gastrointestinal: Negative for nausea, abdominal pain and abdominal distention.  Genitourinary: Negative for frequency, difficulty urinating and vaginal pain.  Musculoskeletal: Negative for myalgias, back pain and gait problem.  Skin: Negative for pallor and rash.  Neurological: Negative for dizziness, tremors, weakness, numbness and headaches.  Psychiatric/Behavioral: Negative for suicidal ideas, confusion and sleep disturbance. The patient is nervous/anxious.        Objective:   Physical Exam  Constitutional: She appears well-developed. No distress.  HENT:  Head: Normocephalic.  Right Ear: External ear normal.  Left Ear: External ear normal.  Nose: Nose normal.  Mouth/Throat: Oropharynx is clear and moist.  Eyes: Conjunctivae are normal. Pupils are equal, round, and reactive to light. Right eye exhibits no discharge. Left eye exhibits no discharge.  Neck: Normal range of motion. Neck supple. No JVD present. No tracheal deviation present. No thyromegaly present.  Cardiovascular: Normal rate, regular rhythm and normal heart sounds.   Pulmonary/Chest: No stridor. No respiratory distress. She has no wheezes.  Abdominal: Soft. Bowel sounds are normal. She  exhibits no distension and no mass. There is no tenderness. There is no rebound and no guarding.  Musculoskeletal: She exhibits edema (L foot 1+, R -). She exhibits no tenderness.  Lymphadenopathy:    She has no cervical adenopathy.  Neurological: She displays normal reflexes. No cranial nerve deficit. She exhibits normal muscle tone. Coordination normal.  Skin: No rash noted. No erythema.  Psychiatric: Her behavior is normal. Judgment and thought content normal.    Lab Results  Component Value Date   WBC 7.5 10/12/2011   HGB 15.2* 10/12/2011   HCT 44.6 10/12/2011   PLT 148.0* 10/12/2011   GLUCOSE 95 08/21/2012   CHOL 132 05/20/2012   TRIG 82.0 05/20/2012   HDL 45.70 05/20/2012   LDLCALC 70 05/20/2012   ALT 14 08/21/2012   AST 21 08/21/2012   NA 139 08/21/2012   K 3.6 08/21/2012   CL 99 08/21/2012   CREATININE 0.7 08/21/2012   BUN 12 08/21/2012   CO2 32 08/21/2012   TSH 0.49 08/21/2012   INR 1.15 08/10/2011   HGBA1C 5.8 10/12/2011     Procedure Note :     Procedure : Cryosurgery   Indication: Actinic keratosis(es)   Risks including unsuccessful procedure , bleeding, infection, bruising, scar, a need for a repeat  procedure and others were explained to the patient in detail as well as the benefits. Informed consent was obtained verbally.    6 lesion(s)  on  face  was/were treated with liquid nitrogen on a Q-tip in a usual fasion . Band-Aid was applied and antibiotic ointment was given for a later use.   Tolerated well. Complications none.   Postprocedure instructions :  Keep the wounds clean. You can wash them with liquid soap and water. Pat dry with gauze or a Kleenex tissue  Before applying antibiotic ointment and a Band-Aid.   You need to report immediately  if  any signs of infection develop.        Assessment & Plan:

## 2013-01-27 ENCOUNTER — Telehealth: Payer: Self-pay | Admitting: Internal Medicine

## 2013-01-27 MED ORDER — RIVASTIGMINE TARTRATE 3 MG PO CAPS: 3.0000 mg | ORAL_CAPSULE | Freq: Two times a day (BID) | ORAL | Status: DC

## 2013-01-27 NOTE — Telephone Encounter (Signed)
Done

## 2013-01-27 NOTE — Telephone Encounter (Signed)
Needs a refill on Rivastigmine sent to [facility].

## 2013-02-24 ENCOUNTER — Ambulatory Visit (INDEPENDENT_AMBULATORY_CARE_PROVIDER_SITE_OTHER): Payer: Medicare Other | Admitting: Internal Medicine

## 2013-02-24 ENCOUNTER — Other Ambulatory Visit (INDEPENDENT_AMBULATORY_CARE_PROVIDER_SITE_OTHER): Payer: Medicare Other

## 2013-02-24 ENCOUNTER — Encounter: Payer: Self-pay | Admitting: Internal Medicine

## 2013-02-24 VITALS — BP 120/80 | HR 76 | Temp 98.1°F | Resp 16 | Wt 120.0 lb

## 2013-02-24 DIAGNOSIS — I10 Essential (primary) hypertension: Secondary | ICD-10-CM | POA: Diagnosis not present

## 2013-02-24 DIAGNOSIS — J449 Chronic obstructive pulmonary disease, unspecified: Secondary | ICD-10-CM

## 2013-02-24 DIAGNOSIS — K219 Gastro-esophageal reflux disease without esophagitis: Secondary | ICD-10-CM | POA: Diagnosis not present

## 2013-02-24 DIAGNOSIS — R413 Other amnesia: Secondary | ICD-10-CM | POA: Diagnosis not present

## 2013-02-24 LAB — BASIC METABOLIC PANEL
CO2: 34 mEq/L — ABNORMAL HIGH (ref 19–32)
Chloride: 99 mEq/L (ref 96–112)
Potassium: 3.8 mEq/L (ref 3.5–5.1)
Sodium: 138 mEq/L (ref 135–145)

## 2013-02-24 NOTE — Assessment & Plan Note (Signed)
Continue with current prescription therapy as reflected on the Med list.  

## 2013-02-24 NOTE — Progress Notes (Signed)
   Subjective:     HPI  The patient presents for a follow-up of  chronic abd pain and poor appetite, hypertension, chronic dyslipidemia, dementia. GI sx's better off Aricept. No foot swelling  Wt Readings from Last 3 Encounters:  02/24/13 120 lb (54.432 kg)  11/25/12 122 lb (55.339 kg)  08/21/12 119 lb 8 oz (54.205 kg)   BP Readings from Last 3 Encounters:  02/24/13 120/80  11/25/12 130/82  08/21/12 142/70      Review of Systems  Constitutional: Negative for chills, activity change, appetite change, fatigue and unexpected weight change.  HENT: Negative for congestion, mouth sores and sinus pressure.   Eyes: Negative for visual disturbance.  Respiratory: Negative for cough, chest tightness and shortness of breath.   Gastrointestinal: Negative for nausea, abdominal pain and abdominal distention.  Genitourinary: Negative for frequency, difficulty urinating and vaginal pain.  Musculoskeletal: Negative for myalgias, back pain and gait problem.  Skin: Negative for pallor and rash.  Neurological: Negative for dizziness, tremors, weakness, numbness and headaches.  Psychiatric/Behavioral: Negative for suicidal ideas, confusion and sleep disturbance. The patient is nervous/anxious.        Objective:   Physical Exam  Constitutional: She appears well-developed. No distress.  HENT:  Head: Normocephalic.  Right Ear: External ear normal.  Left Ear: External ear normal.  Nose: Nose normal.  Mouth/Throat: Oropharynx is clear and moist.  Eyes: Conjunctivae are normal. Pupils are equal, round, and reactive to light. Right eye exhibits no discharge. Left eye exhibits no discharge.  Neck: Normal range of motion. Neck supple. No JVD present. No tracheal deviation present. No thyromegaly present.  Cardiovascular: Normal rate, regular rhythm and normal heart sounds.   Pulmonary/Chest: No stridor. No respiratory distress. She has no wheezes.  Abdominal: Soft. Bowel sounds are normal. She  exhibits no distension and no mass. There is no tenderness. There is no rebound and no guarding.  Musculoskeletal: She exhibits edema (L foot 1+, R -). She exhibits no tenderness.  Lymphadenopathy:    She has no cervical adenopathy.  Neurological: She displays normal reflexes. No cranial nerve deficit. She exhibits normal muscle tone. Coordination normal.  Skin: No rash noted. No erythema.  Psychiatric: Her behavior is normal. Judgment and thought content normal.    Lab Results  Component Value Date   WBC 7.5 10/12/2011   HGB 15.2* 10/12/2011   HCT 44.6 10/12/2011   PLT 148.0* 10/12/2011   GLUCOSE 95 08/21/2012   CHOL 132 05/20/2012   TRIG 82.0 05/20/2012   HDL 45.70 05/20/2012   LDLCALC 70 05/20/2012   ALT 14 08/21/2012   AST 21 08/21/2012   NA 139 08/21/2012   K 3.6 08/21/2012   CL 99 08/21/2012   CREATININE 0.7 08/21/2012   BUN 12 08/21/2012   CO2 32 08/21/2012   TSH 0.49 08/21/2012   INR 1.15 08/10/2011   HGBA1C 5.8 10/12/2011      Assessment & Plan:

## 2013-02-24 NOTE — Assessment & Plan Note (Signed)
Discussed.

## 2013-04-07 ENCOUNTER — Other Ambulatory Visit: Payer: Self-pay | Admitting: Internal Medicine

## 2013-06-25 ENCOUNTER — Encounter: Payer: Self-pay | Admitting: Internal Medicine

## 2013-06-25 ENCOUNTER — Ambulatory Visit (INDEPENDENT_AMBULATORY_CARE_PROVIDER_SITE_OTHER): Payer: Medicare Other | Admitting: Internal Medicine

## 2013-06-25 VITALS — BP 158/94 | HR 80 | Temp 97.0°F | Resp 16 | Wt 120.0 lb

## 2013-06-25 DIAGNOSIS — Z23 Encounter for immunization: Secondary | ICD-10-CM | POA: Diagnosis not present

## 2013-06-25 DIAGNOSIS — R634 Abnormal weight loss: Secondary | ICD-10-CM

## 2013-06-25 DIAGNOSIS — M549 Dorsalgia, unspecified: Secondary | ICD-10-CM

## 2013-06-25 DIAGNOSIS — I10 Essential (primary) hypertension: Secondary | ICD-10-CM | POA: Diagnosis not present

## 2013-06-25 DIAGNOSIS — J449 Chronic obstructive pulmonary disease, unspecified: Secondary | ICD-10-CM

## 2013-06-25 DIAGNOSIS — F0390 Unspecified dementia without behavioral disturbance: Secondary | ICD-10-CM

## 2013-06-25 MED ORDER — RIVASTIGMINE TARTRATE 6 MG PO CAPS: 6.0000 mg | ORAL_CAPSULE | Freq: Two times a day (BID) | ORAL | Status: DC

## 2013-06-25 NOTE — Assessment & Plan Note (Signed)
Continue with current prescription therapy as reflected on the Med list.  

## 2013-06-25 NOTE — Assessment & Plan Note (Signed)
Stable

## 2013-06-25 NOTE — Assessment & Plan Note (Signed)
Doing better.   

## 2013-06-25 NOTE — Assessment & Plan Note (Signed)
Resolved off aricept

## 2013-06-25 NOTE — Assessment & Plan Note (Signed)
Doing well 

## 2013-06-25 NOTE — Progress Notes (Signed)
Subjective:     HPI  The patient presents for a follow-up of  chronic abd pain and poor appetite, hypertension, chronic dyslipidemia, dementia. GI sx's better off Aricept. Doing well on Exelon. No foot swelling  Wt Readings from Last 3 Encounters:  06/25/13 120 lb (54.432 kg)  02/24/13 120 lb (54.432 kg)  11/25/12 122 lb (55.339 kg)   BP Readings from Last 3 Encounters:  06/25/13 158/94  02/24/13 120/80  11/25/12 130/82      Review of Systems  Constitutional: Negative for chills, activity change, appetite change, fatigue and unexpected weight change.  HENT: Negative for congestion, mouth sores and sinus pressure.   Eyes: Negative for visual disturbance.  Respiratory: Negative for cough, chest tightness and shortness of breath.   Gastrointestinal: Negative for nausea, abdominal pain and abdominal distention.  Genitourinary: Negative for frequency, difficulty urinating and vaginal pain.  Musculoskeletal: Negative for myalgias, back pain and gait problem.  Skin: Negative for pallor and rash.  Neurological: Negative for dizziness, tremors, weakness, numbness and headaches.  Psychiatric/Behavioral: Negative for suicidal ideas, confusion and sleep disturbance. The patient is nervous/anxious.        Objective:   Physical Exam  Constitutional: She appears well-developed. No distress.  HENT:  Head: Normocephalic.  Right Ear: External ear normal.  Left Ear: External ear normal.  Nose: Nose normal.  Mouth/Throat: Oropharynx is clear and moist.  Eyes: Conjunctivae are normal. Pupils are equal, round, and reactive to light. Right eye exhibits no discharge. Left eye exhibits no discharge.  Neck: Normal range of motion. Neck supple. No JVD present. No tracheal deviation present. No thyromegaly present.  Cardiovascular: Normal rate, regular rhythm and normal heart sounds.   Pulmonary/Chest: No stridor. No respiratory distress. She has no wheezes.  Abdominal: Soft. Bowel sounds are  normal. She exhibits no distension and no mass. There is no tenderness. There is no rebound and no guarding.  Musculoskeletal: She exhibits edema (L foot 1+, R -). She exhibits no tenderness.  Lymphadenopathy:    She has no cervical adenopathy.  Neurological: She displays normal reflexes. No cranial nerve deficit. She exhibits normal muscle tone. Coordination normal.  Skin: No rash noted. No erythema.  Psychiatric: Her behavior is normal. Judgment and thought content normal.    Subjective:       Objective:     Lab Results  Component Value Date   WBC 7.5 10/12/2011   HGB 15.2* 10/12/2011   HCT 44.6 10/12/2011   GLUCOSE 95 08/21/2012   CHOL 132 05/20/2012   TRIG 82.0 05/20/2012   HDL 45.70 05/20/2012   LDLCALC 70 05/20/2012   ALT 14 08/21/2012   AST 21 08/21/2012   NA 139 08/21/2012   K 3.6 08/21/2012   CL 99 08/21/2012   CREATININE 0.7 08/21/2012   BUN 12 08/21/2012   CO2 32 08/21/2012   TSH 0.49 08/21/2012   INR 1.15 08/10/2011   HGBA1C 5.8 10/12/2011      Assessment & Plan:    Lab Results  Component Value Date   WBC 7.5 10/12/2011   HGB 15.2* 10/12/2011   HCT 44.6 10/12/2011   PLT 148.0* 10/12/2011   GLUCOSE 112* 02/24/2013   CHOL 132 05/20/2012   TRIG 82.0 05/20/2012   HDL 45.70 05/20/2012   LDLCALC 70 05/20/2012   ALT 14 08/21/2012   AST 21 08/21/2012   NA 138 02/24/2013   K 3.8 02/24/2013   CL 99 02/24/2013   CREATININE 0.7 02/24/2013   BUN 15 02/24/2013  CO2 34* 02/24/2013   TSH 0.49 08/21/2012   INR 1.15 08/10/2011   HGBA1C 5.8 10/12/2011      Assessment & Plan:

## 2013-08-12 ENCOUNTER — Telehealth: Payer: Self-pay | Admitting: Gastroenterology

## 2013-08-12 NOTE — Telephone Encounter (Signed)
Pt last seen by Mike Gip, PA 08/13/12. Hx of Dementia, HTN, Hyperlipidemia,, Carotid Artery Stenosis, COPD.last COLON 08/19/12 after Amy's visit showing tubular adenomas and diverticulosis. Last EGD 08/09/11. Husband reports pt has been vomiting on and off x 10 days. She can keep liquids down, but solid food comes back up as soon as she swallows. Pt will see Mike Gip, PA tomorrow.

## 2013-08-13 ENCOUNTER — Encounter: Payer: Self-pay | Admitting: Physician Assistant

## 2013-08-13 ENCOUNTER — Other Ambulatory Visit (INDEPENDENT_AMBULATORY_CARE_PROVIDER_SITE_OTHER): Payer: Medicare Other

## 2013-08-13 ENCOUNTER — Ambulatory Visit (INDEPENDENT_AMBULATORY_CARE_PROVIDER_SITE_OTHER): Payer: Medicare Other | Admitting: Physician Assistant

## 2013-08-13 VITALS — BP 152/76 | HR 88 | Ht 61.0 in | Wt 119.2 lb

## 2013-08-13 DIAGNOSIS — R42 Dizziness and giddiness: Secondary | ICD-10-CM

## 2013-08-13 DIAGNOSIS — R110 Nausea: Secondary | ICD-10-CM | POA: Diagnosis not present

## 2013-08-13 DIAGNOSIS — R51 Headache: Secondary | ICD-10-CM

## 2013-08-13 DIAGNOSIS — R5381 Other malaise: Secondary | ICD-10-CM | POA: Diagnosis not present

## 2013-08-13 DIAGNOSIS — R531 Weakness: Secondary | ICD-10-CM

## 2013-08-13 LAB — URINALYSIS
Leukocytes, UA: NEGATIVE
Specific Gravity, Urine: >=1.03 (ref 1.000–1.030)
Urine Glucose: NEGATIVE
pH: 6 (ref 5.0–8.0)

## 2013-08-13 LAB — CBC WITH DIFFERENTIAL/PLATELET
Basophils Absolute: 0 10*3/uL (ref 0.0–0.1)
Basophils Relative: 0.6 % (ref 0.0–3.0)
Eosinophils Absolute: 0.1 10*3/uL (ref 0.0–0.7)
Eosinophils Relative: 0.8 % (ref 0.0–5.0)
HCT: 45.8 % (ref 36.0–46.0)
Hemoglobin: 15.6 g/dL — ABNORMAL HIGH (ref 12.0–15.0)
Lymphs Abs: 1.6 10*3/uL (ref 0.7–4.0)
MCHC: 34 g/dL (ref 30.0–36.0)
Neutro Abs: 4.5 10*3/uL (ref 1.4–7.7)
RDW: 12.9 % (ref 11.5–14.6)

## 2013-08-13 LAB — COMPREHENSIVE METABOLIC PANEL
ALT: 10 U/L (ref 0–35)
AST: 17 U/L (ref 0–37)
Albumin: 3.8 g/dL (ref 3.5–5.2)
Creatinine, Ser: 1 mg/dL (ref 0.4–1.2)
Glucose, Bld: 124 mg/dL — ABNORMAL HIGH (ref 70–99)
Potassium: 3.9 mEq/L (ref 3.5–5.1)
Total Bilirubin: 0.7 mg/dL (ref 0.3–1.2)

## 2013-08-13 MED ORDER — ONDANSETRON HCL 4 MG PO TABS: ORAL_TABLET | ORAL | Status: DC

## 2013-08-13 NOTE — Progress Notes (Signed)
Subjective:    Patient ID: [name], female    DOB: [birth year], 77 y.o.   MRN: [id number]  HPI  Breigh is a pleasant 77 year old white female known to Dr. Jarold Motto. She has history of adenomatous colon polyps and diverticulosis. History is also pertinent for history of GERD, hypertension, COPD, and dementia. She was last seen in November of 2013 for colonoscopy with finding of left colon diverticulosis and one tubular adenoma. EGD was done in October 2012 with very prominent large gastric folds biopsies were done were benign she also had 3 cm hiatal hernia. CT of the abdomen and pelvis done in December of 2013 was negative. She is status post cholecystectomy. Patient comes in today with new complaint of nausea over the past 11 days. She says her symptoms developed what she was in church a week and a half ago. She had nausea and vomiting and says that every day since then she has had nausea and her husband relates at least one episode of vomiting each day. She says she has some level of nausea 24 hours a day which is worse after eating. She has also been having intermittent dizzy spells over the past 10 days and has told her husband that whenever she coughs she gets a bad headache and pressure the top of her head. She does not have any history of sinus infections no facial congestion no fever or chills denies any earache or sore throat. She denies any abdominal pain has not had any melena or hematochezia or change in her bowel habits. She has been keeping liquids down but says she doesn't have much of an appetite. The only new medication she's been on is Exelon which was started towards the end of September 2014.    Review of Systems  Constitutional: Positive for appetite change and fatigue.  HENT: Negative.   Eyes: Negative.   Respiratory: Negative.   Cardiovascular: Negative.   Gastrointestinal: Positive for nausea.  Endocrine: Negative.   Genitourinary: Negative.    Musculoskeletal: Negative.   Skin: Negative.   Allergic/Immunologic: Negative.   Neurological: Positive for dizziness, weakness and light-headedness.  Hematological: Negative.   Psychiatric/Behavioral: Negative.    Outpatient Prescriptions Prior to Visit  Medication Sig Dispense Refill  . aspirin 81 MG EC tablet Take 81 mg by mouth daily.       . Cholecalciferol (VITAMIN D3) 1000 UNITS CAPS Take 1 capsule by mouth daily.        . cyclobenzaprine (FLEXERIL) 5 MG tablet Take 5 mg by mouth 3 (three) times daily as needed. For spasms      . Diclofenac Sodium (PENNSAID) 1.5 % SOLN Place 3-5 drops onto the skin 3 (three) times daily as needed. For pain applies on joints      . omeprazole (PRILOSEC) 40 MG capsule Take 1 capsule (40 mg total) by mouth daily.  90 capsule  3  . rivastigmine (EXELON) 6 MG capsule Take 1 capsule (6 mg total) by mouth 2 (two) times daily.  180 capsule  3  . simvastatin (ZOCOR) 40 MG tablet TAKE 1 TABLET BY MOUTH AT BEDTIME  90 tablet  2  . SYNTHROID 50 MCG tablet Take 1 tablet (50 mcg total) by mouth daily.  90 tablet  3  . furosemide (LASIX) 20 MG tablet TAKE 2 TABLETS BY MOUTH DAILY  180 tablet  2  . potassium chloride SA (K-DUR,KLOR-CON) 20 MEQ tablet TAKE 1 TABLET BY MOUTH TWICE DAILY  180 tablet  2  .  SYNTHROID 50 MCG tablet take 1 tablet by mouth once daily  30 tablet  5   No facility-administered medications prior to visit.   Allergies  Allergen Reactions  . Aricept [Donepezil Hydrochloride]     abd pain  . Codeine Sulfate Other (See Comments)    Extreme headaches  . Oxycodone-Acetaminophen Other (See Comments)    Extreme headaches   Patient Active Problem List   Diagnosis Date Noted  . Actinic keratoses 11/25/2012  . COPD (chronic obstructive pulmonary disease)   . Hypertension   . Loss of weight 08/22/2011  . Fatigue 08/22/2011  . Benign neoplasm of colon 08/09/2011  . Diverticulosis of colon (without mention of hemorrhage) 08/09/2011  . Family  history of malignant neoplasm of gastrointestinal tract 08/08/2011  . Unexplained weight loss 08/08/2011  . Nausea 08/08/2011  . Dementia 08/08/2011  . HAND PAIN 05/30/2010  . SMOKER 08/03/2009  . NEOPLASM, SKIN, UNCERTAIN BEHAVIOR 01/27/2009  . Memory loss 01/27/2009  . Edema 06/08/2008  . HIP PAIN, RIGHT 06/04/2008  . BACK PAIN 06/04/2008  . OTHER MALAISE AND FATIGUE 11/18/2007  . IMPAIRED FASTING GLUCOSE 11/18/2007  . CHEST PAIN, UNSPECIFIED 10/22/2007  . Acute Bronchitis 10/07/2007  . HYPERLIPIDEMIA 07/24/2007  . OSTEOPENIA 07/24/2007  . HYPOTHYROIDISM 05/08/2007  . CAROTID ARTERY STENOSIS, BILATERAL 05/08/2007  . PERIPHERAL VASCULAR DISEASE 05/08/2007  . GERD 05/08/2007  . MIGRAINES, HX OF 05/08/2007   History  Substance Use Topics  . Smoking status: Current Every Day Smoker -- 1.50 packs/day    Types: Cigarettes  . Smokeless tobacco: Never Used     Comment: Sheet given on smoking  . Alcohol Use: No   family history includes Colon cancer in her sister; Dementia in her mother; Heart disease in her father and sister; Hypertension in her father. There is no history of Esophageal cancer, Rectal cancer, or Stomach cancer.     Objective:   Physical Exam Well-developed white female in no acute distress, accompanied by her husband. Blood pressure 152/76 pulse 88. Orthostatics were done today and on lying blood pressure 174/80 standing blood pressure 152/76. HEENT ;nontraumatic normocephalic EOMI PERRLA sclera anicteric no nystagmus, Supple; no JVD, Cardiovascular; regular rate and rhythm with S1-S2 no murmur or gallop, Pulmonary; clear bilaterally, Abdomen ;soft nontender bowel sounds are active there is no palpable mass or hepatosplenomegaly she does have a cholecystectomy scar, Rectal; exam Hemoccult-negative brown stool, Extremities; no clubbing cyanosis or edema skin warm and dry, Psych mood and affect appropriate       Assessment & Plan:  #76  77 year old female with 11 day  history of nausea, intermittent vomiting, intermittent dizziness weakness and headache.. I do not feel that she has a primary GI problem, I think nausea is secondary. She could have a viral syndrome also wonder about possible sinusitis inner ear infection,brain lesion etc. Another good consideration is medication side effects from Exelon which lists as primary common side effects nausea ,vomiting, dyspepsia, dizziness.  #2 history of adenomatous colon polyps last colonoscopy November 2013 due for followup in 3 years #3 GERD stable #4 diverticulosis #5 dementia #6 hypertension #7 COPD  Plan; will hold Exelon Have made patient appointment to see Dr. Posey Rea  within the next couple of days Start Zofran 4 mg every 6 hours as needed for nausea Continue Prilosec 40 mg by mouth daily Check CBC with differential, CMET ,and UA

## 2013-08-13 NOTE — Patient Instructions (Addendum)
Please go to the basement level to have your labs drawn.  You have been given a separate informational sheet regarding your tobacco use, the importance of quitting and local resources to help you quit. We sent a prescription to [facility] [address] for generic zofran ( ondansetron ) for nausea and vomiting. Take as directed.  Stop the Exelon medication for now.    We got you an appointment with Dr. Posey Rea for tomorrow 08-14-2013 at 11:15 am.

## 2013-08-14 ENCOUNTER — Ambulatory Visit: Payer: Medicare Other | Admitting: Internal Medicine

## 2013-08-15 ENCOUNTER — Ambulatory Visit (INDEPENDENT_AMBULATORY_CARE_PROVIDER_SITE_OTHER): Payer: Medicare Other | Admitting: Internal Medicine

## 2013-08-15 ENCOUNTER — Other Ambulatory Visit: Payer: Self-pay | Admitting: *Deleted

## 2013-08-15 ENCOUNTER — Encounter: Payer: Self-pay | Admitting: Internal Medicine

## 2013-08-15 VITALS — BP 130/72 | HR 80 | Temp 97.0°F | Resp 16 | Wt 121.0 lb

## 2013-08-15 DIAGNOSIS — R634 Abnormal weight loss: Secondary | ICD-10-CM | POA: Diagnosis not present

## 2013-08-15 DIAGNOSIS — R110 Nausea: Secondary | ICD-10-CM | POA: Diagnosis not present

## 2013-08-15 DIAGNOSIS — I10 Essential (primary) hypertension: Secondary | ICD-10-CM

## 2013-08-15 DIAGNOSIS — R413 Other amnesia: Secondary | ICD-10-CM | POA: Diagnosis not present

## 2013-08-15 MED ORDER — PHOSPHATIDYLSERINE-DHA-EPA 100-19.5-6.5 MG PO CAPS: 1.0000 | ORAL_CAPSULE | Freq: Every day | ORAL | Status: DC

## 2013-08-15 NOTE — Assessment & Plan Note (Signed)
Wt Readings from Last 3 Encounters:  08/15/13 121 lb (54.885 kg)  08/13/13 119 lb 3 oz (54.063 kg)  06/25/13 120 lb (54.432 kg)  ?due to Exelon - it was d/c'd

## 2013-08-15 NOTE — Assessment & Plan Note (Signed)
Continue with current prescription therapy as reflected on the Med list.  

## 2013-08-15 NOTE — Progress Notes (Signed)
Subjective:     HPI  The patient presents for a follow-up of  chronic abd pain and poor appetite, hypertension, chronic dyslipidemia, dementia. GI sx's better off Aricept. Now nauseated on Exelon - stopped on Wed - better. No foot swelling  Wt Readings from Last 3 Encounters:  08/15/13 121 lb (54.885 kg)  08/13/13 119 lb 3 oz (54.063 kg)  06/25/13 120 lb (54.432 kg)   BP Readings from Last 3 Encounters:  08/15/13 130/72  08/13/13 152/76  06/25/13 158/94      Review of Systems  Constitutional: Negative for chills, activity change, appetite change, fatigue and unexpected weight change.  HENT: Negative for congestion, mouth sores and sinus pressure.   Eyes: Negative for visual disturbance.  Respiratory: Negative for cough, chest tightness and shortness of breath.   Gastrointestinal: Negative for nausea, abdominal pain and abdominal distention.  Genitourinary: Negative for frequency, difficulty urinating and vaginal pain.  Musculoskeletal: Negative for back pain, gait problem and myalgias.  Skin: Negative for pallor and rash.  Neurological: Negative for dizziness, tremors, weakness, numbness and headaches.  Psychiatric/Behavioral: Negative for suicidal ideas, confusion and sleep disturbance. The patient is nervous/anxious.        Objective:   Physical Exam  Constitutional: She appears well-developed. No distress.  HENT:  Head: Normocephalic.  Right Ear: External ear normal.  Left Ear: External ear normal.  Nose: Nose normal.  Mouth/Throat: Oropharynx is clear and moist.  Eyes: Conjunctivae are normal. Pupils are equal, round, and reactive to light. Right eye exhibits no discharge. Left eye exhibits no discharge.  Neck: Normal range of motion. Neck supple. No JVD present. No tracheal deviation present. No thyromegaly present.  Cardiovascular: Normal rate, regular rhythm and normal heart sounds.   Pulmonary/Chest: No stridor. No respiratory distress. She has no wheezes.   Abdominal: Soft. Bowel sounds are normal. She exhibits no distension and no mass. There is no tenderness. There is no rebound and no guarding.  Musculoskeletal: She exhibits edema (L foot 1+, R -). She exhibits no tenderness.  Lymphadenopathy:    She has no cervical adenopathy.  Neurological: She displays normal reflexes. No cranial nerve deficit. She exhibits normal muscle tone. Coordination normal.  Skin: No rash noted. No erythema.  Psychiatric: Her behavior is normal. Judgment and thought content normal.    Subjective:       Objective:     Lab Results  Component Value Date   WBC 7.5 10/12/2011   HGB 15.2* 10/12/2011   HCT 44.6 10/12/2011   GLUCOSE 95 08/21/2012   CHOL 132 05/20/2012   TRIG 82.0 05/20/2012   HDL 45.70 05/20/2012   LDLCALC 70 05/20/2012   ALT 14 08/21/2012   AST 21 08/21/2012   NA 139 08/21/2012   K 3.6 08/21/2012   CL 99 08/21/2012   CREATININE 0.7 08/21/2012   BUN 12 08/21/2012   CO2 32 08/21/2012   TSH 0.49 08/21/2012   INR 1.15 08/10/2011   HGBA1C 5.8 10/12/2011      Assessment & Plan:    Lab Results  Component Value Date   WBC 6.7 08/13/2013   HGB 15.6* 08/13/2013   HCT 45.8 08/13/2013   PLT 143.0* 08/13/2013   GLUCOSE 124* 08/13/2013   CHOL 132 05/20/2012   TRIG 82.0 05/20/2012   HDL 45.70 05/20/2012   LDLCALC 70 05/20/2012   ALT 10 08/13/2013   AST 17 08/13/2013   NA 142 08/13/2013   K 3.9 08/13/2013   CL 102 08/13/2013  CREATININE 1.0 08/13/2013   BUN 12 08/13/2013   CO2 35* 08/13/2013   TSH 0.49 08/21/2012   INR 1.15 08/10/2011   HGBA1C 5.8 10/12/2011      Assessment & Plan:

## 2013-08-15 NOTE — Assessment & Plan Note (Addendum)
Stopped Exelon 2 d ago - better --- agree w/GI

## 2013-08-15 NOTE — Assessment & Plan Note (Signed)
We can try Vyacog

## 2013-08-15 NOTE — Patient Instructions (Signed)
Please stop Simvastatin if you sill having nausea

## 2013-08-15 NOTE — Progress Notes (Signed)
Pre visit review using our clinic review tool, if applicable. No additional management support is needed unless otherwise documented below in the visit note. 

## 2013-08-22 ENCOUNTER — Telehealth: Payer: Self-pay | Admitting: *Deleted

## 2013-08-22 NOTE — Telephone Encounter (Signed)
Nothing we can do - it is $60/month Thx

## 2013-08-22 NOTE — Telephone Encounter (Signed)
[name] is not covered by pt's Rx benefits plan. They will not let me do a PA. Please advise.

## 2013-08-25 ENCOUNTER — Telehealth: Payer: Self-pay | Admitting: *Deleted

## 2013-08-25 NOTE — Telephone Encounter (Signed)
Pts husband called states per [facility] Drue Dun is not an approved drug, however Lovaza is covered and Omega 3 is the generic.  Please advise

## 2013-08-25 NOTE — Telephone Encounter (Signed)
Not the same: can't change one for another. Thx

## 2013-08-25 NOTE — Telephone Encounter (Signed)
Pt informed

## 2013-08-26 NOTE — Telephone Encounter (Signed)
Spoke with pts husband advised of MDs message 

## 2013-09-17 ENCOUNTER — Other Ambulatory Visit (INDEPENDENT_AMBULATORY_CARE_PROVIDER_SITE_OTHER): Payer: Medicare Other

## 2013-09-17 ENCOUNTER — Other Ambulatory Visit: Payer: Self-pay | Admitting: Internal Medicine

## 2013-09-17 DIAGNOSIS — Z23 Encounter for immunization: Secondary | ICD-10-CM | POA: Diagnosis not present

## 2013-09-17 DIAGNOSIS — J449 Chronic obstructive pulmonary disease, unspecified: Secondary | ICD-10-CM

## 2013-09-17 DIAGNOSIS — I10 Essential (primary) hypertension: Secondary | ICD-10-CM

## 2013-09-17 DIAGNOSIS — F0390 Unspecified dementia without behavioral disturbance: Secondary | ICD-10-CM

## 2013-09-17 DIAGNOSIS — M549 Dorsalgia, unspecified: Secondary | ICD-10-CM

## 2013-09-17 LAB — HEPATIC FUNCTION PANEL
ALT: 14 U/L (ref 0–35)
AST: 18 U/L (ref 0–37)
Bilirubin, Direct: 0.2 mg/dL (ref 0.0–0.3)
Total Protein: 6.7 g/dL (ref 6.0–8.3)

## 2013-09-17 LAB — LIPID PANEL
Cholesterol: 129 mg/dL (ref 0–200)
HDL: 46.3 mg/dL (ref >39.00)
LDL Cholesterol: 72 mg/dL (ref 0–99)
VLDL: 11 mg/dL (ref 0.0–40.0)

## 2013-09-17 LAB — BASIC METABOLIC PANEL
BUN: 11 mg/dL (ref 6–23)
CO2: 30 mEq/L (ref 19–32)
Chloride: 101 mEq/L (ref 96–112)
Creatinine, Ser: 0.8 mg/dL (ref 0.4–1.2)
Glucose, Bld: 133 mg/dL — ABNORMAL HIGH (ref 70–99)
Potassium: 3.2 mEq/L — ABNORMAL LOW (ref 3.5–5.1)

## 2013-09-17 MED ORDER — POTASSIUM CHLORIDE ER 10 MEQ PO TBCR: 10.0000 meq | EXTENDED_RELEASE_TABLET | Freq: Every day | ORAL | Status: DC

## 2013-09-18 ENCOUNTER — Other Ambulatory Visit: Payer: Self-pay | Admitting: *Deleted

## 2013-09-18 MED ORDER — POTASSIUM CHLORIDE ER 10 MEQ PO TBCR: 10.0000 meq | EXTENDED_RELEASE_TABLET | Freq: Every day | ORAL | Status: DC

## 2013-09-24 ENCOUNTER — Ambulatory Visit (INDEPENDENT_AMBULATORY_CARE_PROVIDER_SITE_OTHER): Payer: Medicare Other | Admitting: Internal Medicine

## 2013-09-24 ENCOUNTER — Encounter: Payer: Self-pay | Admitting: Internal Medicine

## 2013-09-24 VITALS — BP 160/80 | Temp 97.0°F | Wt 119.0 lb

## 2013-09-24 DIAGNOSIS — R413 Other amnesia: Secondary | ICD-10-CM

## 2013-09-24 DIAGNOSIS — J449 Chronic obstructive pulmonary disease, unspecified: Secondary | ICD-10-CM | POA: Diagnosis not present

## 2013-09-24 DIAGNOSIS — R634 Abnormal weight loss: Secondary | ICD-10-CM

## 2013-09-24 DIAGNOSIS — R110 Nausea: Secondary | ICD-10-CM

## 2013-09-24 DIAGNOSIS — I10 Essential (primary) hypertension: Secondary | ICD-10-CM

## 2013-09-24 DIAGNOSIS — E785 Hyperlipidemia, unspecified: Secondary | ICD-10-CM

## 2013-09-24 DIAGNOSIS — E876 Hypokalemia: Secondary | ICD-10-CM | POA: Insufficient documentation

## 2013-09-24 DIAGNOSIS — F0390 Unspecified dementia without behavioral disturbance: Secondary | ICD-10-CM

## 2013-09-24 MED ORDER — LOSARTAN POTASSIUM-HCTZ 50-12.5 MG PO TABS: 1.0000 | ORAL_TABLET | Freq: Every day | ORAL | Status: DC

## 2013-09-24 NOTE — Assessment & Plan Note (Signed)
Wt Readings from Last 3 Encounters:  09/24/13 119 lb (53.978 kg)  08/15/13 121 lb (54.885 kg)  08/13/13 119 lb 3 oz (54.063 kg)

## 2013-09-24 NOTE — Assessment & Plan Note (Signed)
Start Hyzaar

## 2013-09-24 NOTE — Assessment & Plan Note (Signed)
Stable

## 2013-09-24 NOTE — Patient Instructions (Signed)
Do not take Simvastatin for 1-2 weeks - see if nausea is better.Marland KitchenMarland Kitchen

## 2013-09-24 NOTE — Progress Notes (Signed)
Pre visit review using our clinic review tool, if applicable. No additional management support is needed unless otherwise documented below in the visit note. 

## 2013-09-24 NOTE — Assessment & Plan Note (Signed)
On KCL

## 2013-09-24 NOTE — Assessment & Plan Note (Signed)
On Vyacog now

## 2013-09-24 NOTE — Assessment & Plan Note (Signed)
Continue with current prescription therapy as reflected on the Med list.  

## 2013-09-24 NOTE — Assessment & Plan Note (Addendum)
GI consult Hold Simvastatin

## 2013-09-27 ENCOUNTER — Other Ambulatory Visit: Payer: Self-pay | Admitting: Internal Medicine

## 2013-10-10 ENCOUNTER — Telehealth: Payer: Self-pay | Admitting: Gastroenterology

## 2013-10-10 NOTE — Telephone Encounter (Signed)
Pt saw Nicoletta Ba, PA on 08/13/13 for nausea x 11 days. She reports 1 episode of vomiting at least 1 x daily. Per AMY: Plan; will hold Exelon  Have made patient appointment to see Dr. Alain Marion within the next couple of days  Start Zofran 4 mg every 6 hours as needed for nausea  Continue Prilosec 40 mg by mouth daily  Check CBC with differential, CMET ,and UA Pt saw Dr Alain Marion on 09/24/13 and has done his suggestions per pt's husband; stopped simvastatin x 2 week with no help and EXELON has been changed to [facility]. Dr Alain Marion is referring pt back to [place]. I spoke with the pt's husband and he states pt has had this issue before and he believes pt had a bleed at that time. I could hear the pt coughing in the background and he stated that is a problem she had before. Pt is on omeprazole daily pt will see Dr Sharlett Iles on 10/28/13.

## 2013-10-19 ENCOUNTER — Other Ambulatory Visit: Payer: Self-pay | Admitting: Internal Medicine

## 2013-10-20 NOTE — Telephone Encounter (Signed)
Refill done.  

## 2013-10-22 ENCOUNTER — Other Ambulatory Visit: Payer: Self-pay | Admitting: Internal Medicine

## 2013-10-28 ENCOUNTER — Encounter: Payer: Self-pay | Admitting: Gastroenterology

## 2013-10-28 ENCOUNTER — Ambulatory Visit (INDEPENDENT_AMBULATORY_CARE_PROVIDER_SITE_OTHER): Payer: Medicare Other | Admitting: Gastroenterology

## 2013-10-28 ENCOUNTER — Ambulatory Visit (INDEPENDENT_AMBULATORY_CARE_PROVIDER_SITE_OTHER)
Admission: RE | Admit: 2013-10-28 | Discharge: 2013-10-28 | Disposition: A | Discharge status: Home / Self Care | Payer: Medicare Other | Source: Ambulatory Visit | Attending: Gastroenterology | Admitting: Gastroenterology

## 2013-10-28 VITALS — BP 122/60 | HR 93 | Ht 61.42 in | Wt 116.5 lb

## 2013-10-28 DIAGNOSIS — R413 Other amnesia: Secondary | ICD-10-CM

## 2013-10-28 DIAGNOSIS — F17200 Nicotine dependence, unspecified, uncomplicated: Secondary | ICD-10-CM | POA: Diagnosis not present

## 2013-10-28 DIAGNOSIS — Z860100 Personal history of colon polyps, unspecified: Secondary | ICD-10-CM

## 2013-10-28 DIAGNOSIS — R110 Nausea: Secondary | ICD-10-CM | POA: Diagnosis not present

## 2013-10-28 DIAGNOSIS — J42 Unspecified chronic bronchitis: Secondary | ICD-10-CM

## 2013-10-28 DIAGNOSIS — J449 Chronic obstructive pulmonary disease, unspecified: Secondary | ICD-10-CM | POA: Diagnosis not present

## 2013-10-28 MED ORDER — ONDANSETRON HCL 4 MG PO TABS: 4.0000 mg | ORAL_TABLET | Freq: Three times a day (TID) | ORAL | Status: DC | PRN

## 2013-10-28 NOTE — Patient Instructions (Addendum)
You have been scheduled for an endoscopy with propofol. Please follow written instructions given to you at your visit today. If you use inhalers (even only as needed), please bring them with you on the day of your procedure. Your physician has requested that you go to www.startemmi.com and enter the access code given to you at your visit today. This web site gives a general overview about your procedure. However, you should still follow specific instructions given to you by our office regarding your preparation for the procedure.  Please go basement level today to x-ray department for a chest x-ray.  You have been given a separate informational sheet regarding your tobacco use, the importance of quitting and local resources to help you quit.  New prescription for Zofran was sent to your pharmacy

## 2013-10-28 NOTE — Progress Notes (Signed)
This is a mildly demented 78 year old Caucasian female who complains of 6 months of nausea and mild anorexia but no significant weight loss, abdominal pain or hepatobiliary complaints.  She is status post cholecystectomy.  She been evaluated by Dr. Antonieta Loveless internal medicine has changed her medications around on several occasions, most recently stopping her dementia medication placing her on a drug Vayacog.  He try stopping her simvastatin did not help her nausea, and a brief trial of Zofran also is of note affect.  The patient denies reflux symptoms, dysphagia, icterus, clinical her stools, melena or hematochezia.  She was previously hospitalized 2 years ago with anorexia weight loss had a negative evaluation without any evidence of pancreatitis or pancreatic cancer.  She denies systemic complaints, any specific food intolerances,  has had no improvement with one month of Prilosec, and continues to take aspirin 81 mg a day, when necessary Flexeril, and Lasix 20 mg a day.  She has some small colon polyps removed in November of 2013.  Last endoscopy was normal in October of 2012.  Patient denies use of other NSAIDs.  Current Medications, Allergies, Past Medical History, Past Surgical History, Family History and Social History were reviewed in [employer] record.  ROS: All systems were reviewed and are negative unless otherwise stated in the HPI.   Allergies  Allergen Reactions  . Aricept [Donepezil Hydrochloride]     abd pain  . Codeine Sulfate Other (See Comments)    Extreme headaches  . Exelon [Rivastigmine Tartrate]     nausea  . Oxycodone-Acetaminophen Other (See Comments)    Extreme headaches   Outpatient Prescriptions Prior to Visit  Medication Sig Dispense Refill  . aspirin 81 MG EC tablet Take 81 mg by mouth daily.       . Cholecalciferol (VITAMIN D3) 1000 UNITS CAPS Take 1 capsule by mouth daily.        . cyclobenzaprine (FLEXERIL) 5 MG tablet Take 5 mg by mouth 3  (three) times daily as needed. For spasms      . Diclofenac Sodium (PENNSAID) 1.5 % SOLN Place 3-5 drops onto the skin 3 (three) times daily as needed. For pain applies on joints      . losartan-hydrochlorothiazide (HYZAAR) 50-12.5 MG per tablet Take 1 tablet by mouth daily.  30 tablet  0  . omeprazole (PRILOSEC) 40 MG capsule TAKE 1 CAPSULE DAILY  90 capsule  2  . Phosphatidylserine-DHA-EPA (VAYACOG) 100-19.5-6.5 MG CAPS Take 1 capsule by mouth daily.  90 capsule  2  . potassium chloride (KLOR-CON 10) 10 MEQ tablet Take 1 tablet (10 mEq total) by mouth daily.  90 tablet  3  . simvastatin (ZOCOR) 40 MG tablet TAKE 1 TABLET AT BEDTIME  90 tablet  2  . SYNTHROID 50 MCG tablet TAKE 1 TABLET DAILY  90 tablet  2  . furosemide (LASIX) 20 MG tablet TAKE 2 TABLETS DAILY  180 tablet  2  . ondansetron (ZOFRAN) 4 MG tablet Take  1 tab every 6 hours as needed for nausea and vomiting.  40 tablet  1  . furosemide (LASIX) 20 MG tablet AS NEEDED       No facility-administered medications prior to visit.   Past Medical History  Diagnosis Date  . GERD (gastroesophageal reflux disease)   . Peripheral vascular disease   . Hyperlipidemia   . Osteopenia   . Low back pain     OA R radiculopathy  . Lower extremity edema  chronic  . Personal history of colonic polyps 2001    TUBULAR ADENOMA - [name]  . Diverticulosis of colon (without mention of hemorrhage)   . COPD (chronic obstructive pulmonary disease)   . Hypertension   . Hiatal hernia   . Hypothyroidism   . Anxiety   . Peptic ulcer   . Gallstones   . Pneumonia    Past Surgical History  Procedure Laterality Date  . Abdominal hysterectomy    . Appendectomy    . Tubal ligation    . Cholecystectomy    . Carpal tunnel release Bilateral   . Rotator cuff repair Bilateral   . Neck surgery      bone and plate   . Abdominal adhesion surgery     History   Social History  . Marital Status: Married    Spouse Name: N/A    Number of Children: 4   . Years of Education: N/A   Occupational History  . retired    Social History Main Topics  . Smoking status: Current Every Day Smoker -- 1.50 packs/day    Types: Cigarettes  . Smokeless tobacco: Never Used     Comment: Sheet given on smoking  . Alcohol Use: No  . Drug Use: No  . Sexual Activity: None   Other Topics Concern  . None   Social History Narrative  . None   Family History  Problem Relation Age of Onset  . Dementia Mother   . Hypertension Father   . Heart disease Father   . Colon cancer Sister   . Heart disease Sister   . Esophageal cancer Neg Hx   . Rectal cancer Neg Hx   . Stomach cancer Neg Hx             Physical Exam: Awake alert and oriented x3.  Blood pressure 122/60, pulse 93 and regular and weight 116 with a BMI of 21.71.  Chest is entirely clear.  There diminished breath sounds in both lung fields without wheezes or rhonchi.  She has a flat abdomen without organomegaly, masses or tenderness.  There is no succussion splash.  Peripheral extremities are unremarkable.  Specks of rectum is unremarkable.  Rectal exam shows no masses or tenderness, and stool is guaiac-negative.  She has no gross focal neurological deficits.    Assessment and Plan: Chronic nausea mild anorexia over the last several years with chart review.  I suspect her mild nausea is related to her dementia and medications,and that we will not find a GI source.  However, I will repeat her endoscopy and check for H. pylori and have restarted when necessary Zofran 4 mg every 8 hours as needed.  If her endoscopy is negative we will do gastric emptying scan.  I cannot see where she's had CT scan of the head which probably is in order with her dementia and nausea.  As before, she is status post cholecystectomy.  Review of her labs shows normal CBC, metabolic profile, liver function test and thyroid function.  Her husband was present during interview and exam and is very controlling.  She is a smoker  actively and has COPD and also probably needs chest x-ray which I cannot see has been done in several years.  She has chronic bronchitis but chest x-ray report.  We will order a chest x-ray before endoscopic exam.  Cc Dr. Alain Marion

## 2013-10-29 ENCOUNTER — Encounter: Payer: Self-pay | Admitting: Gastroenterology

## 2013-10-29 ENCOUNTER — Ambulatory Visit (AMBULATORY_SURGERY_CENTER): Payer: Medicare Other | Admitting: Gastroenterology

## 2013-10-29 VITALS — BP 165/78 | HR 85 | Temp 97.0°F | Resp 20 | Ht 61.0 in | Wt 116.5 lb

## 2013-10-29 DIAGNOSIS — R110 Nausea: Secondary | ICD-10-CM | POA: Diagnosis not present

## 2013-10-29 DIAGNOSIS — D1330 Benign neoplasm of unspecified part of small intestine: Secondary | ICD-10-CM | POA: Diagnosis not present

## 2013-10-29 MED ORDER — SODIUM CHLORIDE 0.9 % IV SOLN: 500.0000 mL | INTRAVENOUS | Status: DC

## 2013-10-29 NOTE — Op Note (Signed)
[place]  [address]. [place], [id number]   ENDOSCOPY PROCEDURE REPORT  PATIENT: Tionna, Gigante  MR#: [id number] BIRTHDATE: 07-11-1936 , 77  yrs. old GENDER: Female ENDOSCOPIST:Tiarah Shisler Consuello Masse, MD, [facility] REFERRED BY: Creig Hines, M.D. PROCEDURE DATE:  10/29/2013 PROCEDURE:   EGD w/ biopsy and EGD w/ biopsy for H.pylori ASA CLASS:    Class II INDICATIONS: Nausea. MEDICATION: propofol (Diprivan) 150mg  IV TOPICAL ANESTHETIC:  DESCRIPTION OF PROCEDURE:   After the risks and benefits of the procedure were explained, informed consent was obtained.  The LB [id number]  endoscope was introduced through the mouth  and advanced to the second portion of the duodenum .  The instrument was slowly withdrawn as the mucosa was fully examined.      DUODENUM: The duodenal mucosa showed no abnormalities in the bulb and second portion of the duodenum.  Cold forceps biopsies were taken in the bulb and second portion.  ESOPHAGUS: The mucosa of the esophagus appeared normal.  STOMACH: The mucosa of the stomach appeared normal.Large rugae and hyperplastic polyps noted. CLO Bx. done.    Retroflexed views revealed no abnormalities.    The scope was then withdrawn from the patient and the procedure completed.  COMPLICATIONS: There were no complications.   ENDOSCOPIC IMPRESSION: 1.   The duodenal mucosa showed no abnormalities in the bulb and second portion of the duodenum...r/o celiac disease 2.   The mucosa of the esophagus appeared normal 3.   The mucosa of the stomach appeared normal ..Marland KitchenCLO Bx. done  RECOMMENDATIONS: Continue current meds Head CT scan and neurology consult.comtinue meds   _______________________________ eSigned:  Sable Feil, MD, [facility] 10/29/2013 11:13 AM      PATIENT NAME:  Suzzane, Quilter MR#: [id number]

## 2013-10-29 NOTE — Progress Notes (Signed)
A/ox3 pleased with MAC, report to Jane RN 

## 2013-10-29 NOTE — Progress Notes (Signed)
Called to room to assist during endoscopic procedure.  Patient ID and intended procedure confirmed with present staff. Received instructions for my participation in the procedure from the performing physician.  

## 2013-10-29 NOTE — Patient Instructions (Signed)
YOU HAD AN ENDOSCOPIC PROCEDURE TODAY AT [facility]: Refer to the procedure report that was given to you for any specific questions about what was found during the examination.  If the procedure report does not answer your questions, please call your gastroenterologist to clarify.  If you requested that your care partner not be given the details of your procedure findings, then the procedure report has been included in a sealed envelope for you to review at your convenience later.  YOU SHOULD EXPECT: Some feelings of bloating in the abdomen. Passage of more gas than usual.  Walking can help get rid of the air that was put into your GI tract during the procedure and reduce the bloating. If you had a lower endoscopy (such as a colonoscopy or flexible sigmoidoscopy) you may notice spotting of blood in your stool or on the toilet paper. If you underwent a bowel prep for your procedure, then you may not have a normal bowel movement for a few days.  DIET: Your first meal following the procedure should be a light meal and then it is ok to progress to your normal diet.  A half-sandwich or bowl of soup is an example of a good first meal.  Heavy or fried foods are harder to digest and may make you feel nauseous or bloated.  Likewise meals heavy in dairy and vegetables can cause extra gas to form and this can also increase the bloating.  Drink plenty of fluids but you should avoid alcoholic beverages for 24 hours.  ACTIVITY: Your care partner should take you home directly after the procedure.  You should plan to take it easy, moving slowly for the rest of the day.  You can resume normal activity the day after the procedure however you should NOT DRIVE or use heavy machinery for 24 hours (because of the sedation medicines used during the test).    SYMPTOMS TO REPORT IMMEDIATELY: A gastroenterologist can be reached at any hour.  During normal business hours, 8:30 AM to 5:00 PM Monday through Friday,  call [phone].  After hours and on weekends, please call the GI answering service at [phone] who will take a message and have the physician on call contact you.     Following upper endoscopy (EGD)  Vomiting of blood or coffee ground material  New chest pain or pain under the shoulder blades  Painful or persistently difficult swallowing  New shortness of breath  Fever of 100F or higher  Black, tarry-looking stools     FOLLOW UP: If any biopsies were taken you will be contacted by phone or by letter within the next 1-3 weeks.  Call your gastroenterologist if you have not heard about the biopsies in 3 weeks.  Our staff will call the home number listed on your records the next business day following your procedure to check on you and address any questions or concerns that you may have at that time regarding the information given to you following your procedure. This is a courtesy call and so if there is no answer at the home number and we have not heard from you through the emergency physician on call, we will assume that you have returned to your regular daily activities without incident.  SIGNATURES/CONFIDENTIALITY: You and/or your care partner have signed paperwork which will be entered into your electronic medical record.  These signatures attest to the fact that that the information above on your After Visit Summary has been reviewed and  is understood.  Full responsibility of the confidentiality of this discharge information lies with you and/or your care-partner.  Clo test sent for H-pylori.  Continue current medications  CT Scan of head and neurology consult to be scheduled by Dr. Buel Ream nurse, she will contact you.

## 2013-10-30 ENCOUNTER — Ambulatory Visit: Payer: Medicare Other | Admitting: Gastroenterology

## 2013-10-30 ENCOUNTER — Telehealth: Payer: Self-pay | Admitting: *Deleted

## 2013-10-30 LAB — HELICOBACTER PYLORI SCREEN-BIOPSY: UREASE: NEGATIVE

## 2013-10-30 NOTE — Telephone Encounter (Signed)
No answer, left message to call if questions or concerns. 

## 2013-10-31 ENCOUNTER — Telehealth: Payer: Self-pay | Admitting: *Deleted

## 2013-10-31 ENCOUNTER — Encounter: Payer: Self-pay | Admitting: Gastroenterology

## 2013-10-31 DIAGNOSIS — R413 Other amnesia: Secondary | ICD-10-CM

## 2013-10-31 NOTE — Telephone Encounter (Signed)
Informed husband of CT appt on 1//27/15 at 2pm ; no prep. Also Neuro referral with Dr Tomi Likens on 11/28/13 at [place]. He stated understanding.

## 2013-11-04 ENCOUNTER — Ambulatory Visit (INDEPENDENT_AMBULATORY_CARE_PROVIDER_SITE_OTHER)
Admission: RE | Admit: 2013-11-04 | Discharge: 2013-11-04 | Disposition: A | Discharge status: Home / Self Care | Payer: Medicare Other | Source: Ambulatory Visit | Attending: Gastroenterology | Admitting: Gastroenterology

## 2013-11-04 ENCOUNTER — Encounter: Payer: Self-pay | Admitting: Gastroenterology

## 2013-11-04 DIAGNOSIS — R413 Other amnesia: Secondary | ICD-10-CM | POA: Diagnosis not present

## 2013-11-04 DIAGNOSIS — I672 Cerebral atherosclerosis: Secondary | ICD-10-CM | POA: Diagnosis not present

## 2013-11-28 ENCOUNTER — Ambulatory Visit: Payer: Medicare Other | Admitting: Neurology

## 2013-12-16 ENCOUNTER — Other Ambulatory Visit (INDEPENDENT_AMBULATORY_CARE_PROVIDER_SITE_OTHER): Payer: Medicare Other

## 2013-12-16 DIAGNOSIS — E785 Hyperlipidemia, unspecified: Secondary | ICD-10-CM | POA: Diagnosis not present

## 2013-12-16 DIAGNOSIS — E876 Hypokalemia: Secondary | ICD-10-CM

## 2013-12-16 DIAGNOSIS — R110 Nausea: Secondary | ICD-10-CM | POA: Diagnosis not present

## 2013-12-16 LAB — LIPID PANEL
CHOL/HDL RATIO: 3
Cholesterol: 131 mg/dL (ref 0–200)
HDL: 46.9 mg/dL (ref >39.00)
LDL CALC: 70 mg/dL (ref 0–99)
Triglycerides: 69 mg/dL (ref 0.0–149.0)
VLDL: 13.8 mg/dL (ref 0.0–40.0)

## 2013-12-16 LAB — BASIC METABOLIC PANEL
BUN: 16 mg/dL (ref 6–23)
CO2: 31 meq/L (ref 19–32)
Calcium: 9.1 mg/dL (ref 8.4–10.5)
Chloride: 98 mEq/L (ref 96–112)
Creatinine, Ser: 0.8 mg/dL (ref 0.4–1.2)
GFR: 71.03 mL/min (ref >60.00)
GLUCOSE: 136 mg/dL — AB (ref 70–99)
Potassium: 3.8 mEq/L (ref 3.5–5.1)
SODIUM: 136 meq/L (ref 135–145)

## 2013-12-16 LAB — HEPATIC FUNCTION PANEL
ALT: 15 U/L (ref 0–35)
AST: 20 U/L (ref 0–37)
Albumin: 4.1 g/dL (ref 3.5–5.2)
Alkaline Phosphatase: 60 U/L (ref 39–117)
Bilirubin, Direct: 0.3 mg/dL (ref 0.0–0.3)
Total Bilirubin: 1.1 mg/dL (ref 0.3–1.2)
Total Protein: 6.6 g/dL (ref 6.0–8.3)

## 2013-12-23 ENCOUNTER — Encounter: Payer: Self-pay | Admitting: Internal Medicine

## 2013-12-23 ENCOUNTER — Encounter: Payer: Self-pay | Admitting: Neurology

## 2013-12-23 ENCOUNTER — Ambulatory Visit (INDEPENDENT_AMBULATORY_CARE_PROVIDER_SITE_OTHER): Payer: Medicare Other | Admitting: Internal Medicine

## 2013-12-23 ENCOUNTER — Ambulatory Visit (INDEPENDENT_AMBULATORY_CARE_PROVIDER_SITE_OTHER): Payer: Medicare Other | Admitting: Neurology

## 2013-12-23 VITALS — BP 132/74 | HR 89 | Resp 18 | Ht 61.0 in | Wt 118.3 lb

## 2013-12-23 VITALS — BP 140/70 | HR 72 | Temp 97.8°F | Resp 16 | Wt 118.0 lb

## 2013-12-23 DIAGNOSIS — R110 Nausea: Secondary | ICD-10-CM | POA: Diagnosis not present

## 2013-12-23 DIAGNOSIS — R413 Other amnesia: Secondary | ICD-10-CM

## 2013-12-23 DIAGNOSIS — F0280 Dementia in other diseases classified elsewhere without behavioral disturbance: Secondary | ICD-10-CM

## 2013-12-23 DIAGNOSIS — R634 Abnormal weight loss: Secondary | ICD-10-CM | POA: Diagnosis not present

## 2013-12-23 DIAGNOSIS — G309 Alzheimer's disease, unspecified: Secondary | ICD-10-CM | POA: Diagnosis not present

## 2013-12-23 DIAGNOSIS — F0390 Unspecified dementia without behavioral disturbance: Secondary | ICD-10-CM

## 2013-12-23 DIAGNOSIS — I10 Essential (primary) hypertension: Secondary | ICD-10-CM

## 2013-12-23 MED ORDER — DONEPEZIL HCL 5 MG PO TABS: 5.0000 mg | ORAL_TABLET | Freq: Every day | ORAL | Status: DC

## 2013-12-23 MED ORDER — DONEPEZIL HCL 10 MG PO TABS: 10.0000 mg | ORAL_TABLET | Freq: Every day | ORAL | Status: DC

## 2013-12-23 NOTE — Assessment & Plan Note (Signed)
She will re-start Aricept 5 mg a day per Dr Tomi Likens

## 2013-12-23 NOTE — Progress Notes (Signed)
Pre visit review using our clinic review tool, if applicable. No additional management support is needed unless otherwise documented below in the visit note. 

## 2013-12-23 NOTE — Patient Instructions (Addendum)
1.  We will start donepezil (Aricept) 5mg  daily for four weeks.  If you are tolerating the medication, then after four weeks, we will increase the dose to 10mg  daily.  Side effects include nausea, vomiting, diarrhea, vivid dreams, and muscle cramps.  Please call the clinic if you experience any of these symptoms. 2.  Limit driving only to church and the grocery store and during daylight hours. 3.  Do crossword puzzles and other brain teasers. 4.  Go for walks with your husband for exercise. 5.  Eat Mediterranean diet (fish, white chicken meat, vegetables, fruit, nuts) 6.  We will provide info on websites and support groups 7.  Follow up in 6 months. 8.  Check B12 level.

## 2013-12-23 NOTE — Progress Notes (Signed)
NEUROLOGY CONSULTATION NOTE  [name] MRN: [id number] DOB: Jun 12, 1936  Referring provider: Dr. Sharlett Iles Primary care provider: Dr. Alain Marion  Reason for consult:  Memory problems.  HISTORY OF PRESENT ILLNESS: [name] is a 78 year old right-handed woman with history of GERD, peripheral vascular disease, hyperlipidemia, COPD, hypertension, hypothyroidism, cholecystectomy and anxiety who presents for memory problems.  She is accompanied by her husband.  Records and images were personally reviewed where available.    Her husband began to notice changes in memory about 2 or 3 years ago.  She would have problems with short-term memory.  She would forget about phone calls.  She would repeat questions.  On two occasions, the last one about a month ago, she got lost on a familiar route.  She has to call her husband who tries to redirect her via the phone.  Still drives, usually only to church.  Her husband usually goes to the grocery store.  She has no difficulty remembering names or faces.  She has no hallucinations or delusions.  She sleeps well.  She is not depressed.  There has been no change in personality or behavior.  She is able to perform all her ADLs.  During the day, she tends to the house but spends time watching TV.  She reads the Bible but not everyday.  She and her husband are active in the church.  They go to choir practice every Wednesday night.  They participate in a senior group once a month.  Her mother and maternal aunt had dementia.  She has history of nausea and mild anorexia.  No vomiting.  She had been on both Aricept and rivastigmine at one time.  They were discontinued with concern that it was causing the the nausea.  However, the nausea never resolved.  She has been evaluated by GI with no clear etiology.  She denies visual disturbance, dizziness, lightheadedness or gait instability.  She does endorse a cough headache.  11/05/13 CT HEAD:  no bleed,  mass lesions or acute infarcts seen.  Mild atrophy and atherosclerotic changes noted.  PAST MEDICAL HISTORY: Past Medical History  Diagnosis Date  . GERD (gastroesophageal reflux disease)   . Peripheral vascular disease   . Hyperlipidemia   . Osteopenia   . Low back pain     OA R radiculopathy  . Lower extremity edema     chronic  . Personal history of colonic polyps 2001    TUBULAR ADENOMA - [name]  . Diverticulosis of colon (without mention of hemorrhage)   . COPD (chronic obstructive pulmonary disease)   . Hypertension   . Hiatal hernia   . Hypothyroidism   . Anxiety   . Peptic ulcer   . Gallstones   . Pneumonia   . Dementia     PAST SURGICAL HISTORY: Past Surgical History  Procedure Laterality Date  . Abdominal hysterectomy    . Appendectomy    . Tubal ligation    . Cholecystectomy    . Carpal tunnel release Bilateral   . Rotator cuff repair Bilateral   . Neck surgery      bone and plate   . Abdominal adhesion surgery      MEDICATIONS: Current Outpatient Prescriptions on File Prior to Visit  Medication Sig Dispense Refill  . aspirin 81 MG EC tablet Take 81 mg by mouth daily.       . Cholecalciferol (VITAMIN D3) 1000 UNITS CAPS Take 1 capsule by mouth daily.        Marland Kitchen  Diclofenac Sodium (PENNSAID) 1.5 % SOLN Place 3-5 drops onto the skin 3 (three) times daily as needed. For pain applies on joints      . furosemide (LASIX) 20 MG tablet Take 1 tablet by mouth daily as needed      . losartan-hydrochlorothiazide (HYZAAR) 50-12.5 MG per tablet Take 1 tablet by mouth daily.  30 tablet  0  . omeprazole (PRILOSEC) 40 MG capsule TAKE 1 CAPSULE DAILY  90 capsule  2  . Phosphatidylserine-DHA-EPA (VAYACOG) 100-19.5-6.5 MG CAPS Take 1 capsule by mouth daily.  90 capsule  2  . potassium chloride (KLOR-CON 10) 10 MEQ tablet Take 1 tablet (10 mEq total) by mouth daily.  90 tablet  3  . simvastatin (ZOCOR) 40 MG tablet TAKE 1 TABLET AT BEDTIME  90 tablet  2  . SYNTHROID 50 MCG  tablet TAKE 1 TABLET DAILY  90 tablet  2  . cyclobenzaprine (FLEXERIL) 5 MG tablet Take 5 mg by mouth 3 (three) times daily as needed. For spasms      . ondansetron (ZOFRAN) 4 MG tablet Take 1 tablet (4 mg total) by mouth every 8 (eight) hours as needed. Take  1 tab every 6 hours as needed for nausea and vomiting.  60 tablet  1   No current facility-administered medications on file prior to visit.    ALLERGIES: Allergies  Allergen Reactions  . Aricept [Donepezil Hydrochloride]     abd pain  . Codeine Sulfate Other (See Comments)    Extreme headaches  . Exelon [Rivastigmine Tartrate]     nausea  . Oxycodone-Acetaminophen Other (See Comments)    Extreme headaches    FAMILY HISTORY: Family History  Problem Relation Age of Onset  . Dementia Mother   . Hypertension Father   . Heart disease Father   . Colon cancer Sister   . Heart disease Sister   . Esophageal cancer Neg Hx   . Rectal cancer Neg Hx   . Stomach cancer Neg Hx     SOCIAL HISTORY: History   Social History  . Marital Status: Married    Spouse Name: N/A    Number of Children: 4  . Years of Education: N/A   Occupational History  . retired    Social History Main Topics  . Smoking status: Current Every Day Smoker -- 1.50 packs/day    Types: Cigarettes  . Smokeless tobacco: Never Used     Comment: Sheet given on smoking  . Alcohol Use: No  . Drug Use: No  . Sexual Activity: Not on file   Other Topics Concern  . Not on file   Social History Narrative  . No narrative on file    REVIEW OF SYSTEMS: Constitutional: No fevers, chills, or sweats, no generalized fatigue, reduced appetite Eyes: No visual changes, double vision, eye pain Ear, nose and throat: No hearing loss, ear pain, nasal congestion, sore throat Cardiovascular: No chest pain, palpitations Respiratory:  Shortness of breath GastrointestinaI: Nausea Genitourinary:  No dysuria, urinary retention or frequency Musculoskeletal:  No neck pain,  back pain Integumentary: No rash, pruritus, skin lesions Neurological: as above Psychiatric: No depression, insomnia, anxiety Endocrine: No palpitations, fatigue, diaphoresis, mood swings, change in appetite, change in weight, increased thirst Hematologic/Lymphatic:  No anemia, purpura, petechiae. Allergic/Immunologic: no itchy/runny eyes, nasal congestion, recent allergic reactions, rashes  PHYSICAL EXAM: Filed Vitals:   12/23/13 1043  BP: 132/74  Pulse: 89  Resp: 18   General: No acute distress Head:  Normocephalic/atraumatic Neck: supple,  no paraspinal tenderness, full range of motion Back: No paraspinal tenderness Heart: regular rate and rhythm Lungs: Clear to auscultation bilaterally. Vascular: No carotid bruits. Neurological Exam: Mental status: alert and oriented to person, place (except floor), season, day of week and month (not year or date).  Speech fluent and not dysarthric.  Able to perform serial 7 subtraction.  Unable to recall 3 of 3 words after a couple of minutes.  Able to name, repeat, read, write and follow 3 step commands across midline.  Able to copy intersecting pentagons.  Able to draw contour of clock and placement of numbers but not able to draw hands to requested time.  MMSE 24/30. Cranial nerves: CN I: not tested CN II: pupils equal, round and reactive to light, visual fields intact, fundi unremarkable. CN III, IV, VI:  full range of motion, no nystagmus, no ptosis CN V: facial sensation intact CN VII: upper and lower face symmetric CN VIII: hearing intact CN IX, X: gag intact, uvula midline CN XI: sternocleidomastoid and trapezius muscles intact CN XII: tongue midline Bulk & Tone: normal, no fasciculations. Motor: 5/5 throughout Sensation: pinprick and vibration intact. Deep Tendon Reflexes: 2+ throughout, toes down. Finger to nose testing: normal Heel to shin: normal Gait: normal stance and stride.  Able to turn and walk in tandem. Romberg  negative. Marland Kitchen  IMPRESSION: Alzheimer's disease Nausea.  I do not appreciate a neurological etiology.  PLAN: 1.  Since the nausea persisted after stopping Aricept, I would favor restarting Aricept.  5mg  daily for one month and then 10mg  daily. 2.  Encouraged staying active in church 3.  Encouraged puzzles and brainteasers 4.  Exercise (walks) 5.  Literature of support groups and websites. 6.  Limit driving to church and gracery store and only during daylight hours. 7.  Check B12 level 8.  Follow up in 6 months.  45 minutes spent with patient, over 50% spent counseling and coordinating care.  Thank you for allowing me to take part in the care of this patient.  Metta Clines, DO  CC:  Verl Blalock, MD  Lew Dawes, MD

## 2013-12-23 NOTE — Assessment & Plan Note (Signed)
See below

## 2013-12-23 NOTE — Assessment & Plan Note (Signed)
Wt Readings from Last 3 Encounters:  12/23/13 118 lb (53.524 kg)  12/23/13 118 lb 5 oz (53.666 kg)  10/29/13 116 lb 8 oz (52.844 kg)

## 2013-12-23 NOTE — Progress Notes (Signed)
   Subjective:     HPI  The patient presents for a follow-up of  chronic abd pain and poor appetite, hypertension, chronic dyslipidemia, dementia. GI sx's better off Aricept. No foot swelling She sa Dr Tomi Likens today.  Wt Readings from Last 3 Encounters:  12/23/13 118 lb (53.524 kg)  12/23/13 118 lb 5 oz (53.666 kg)  10/29/13 116 lb 8 oz (52.844 kg)   BP Readings from Last 3 Encounters:  12/23/13 140/70  12/23/13 132/74  10/29/13 165/78      Review of Systems  Constitutional: Negative for chills, activity change, appetite change, fatigue and unexpected weight change.  HENT: Negative for congestion, mouth sores and sinus pressure.   Eyes: Negative for visual disturbance.  Respiratory: Negative for cough, chest tightness and shortness of breath.   Gastrointestinal: Negative for nausea, abdominal pain and abdominal distention.  Genitourinary: Negative for frequency, difficulty urinating and vaginal pain.  Musculoskeletal: Negative for back pain, gait problem and myalgias.  Skin: Negative for pallor and rash.  Neurological: Negative for dizziness, tremors, weakness, numbness and headaches.  Psychiatric/Behavioral: Negative for suicidal ideas, confusion and sleep disturbance. The patient is nervous/anxious.        Objective:   Physical Exam  Constitutional: She appears well-developed. No distress.  HENT:  Head: Normocephalic.  Right Ear: External ear normal.  Left Ear: External ear normal.  Nose: Nose normal.  Mouth/Throat: Oropharynx is clear and moist.  Eyes: Conjunctivae are normal. Pupils are equal, round, and reactive to light. Right eye exhibits no discharge. Left eye exhibits no discharge.  Neck: Normal range of motion. Neck supple. No JVD present. No tracheal deviation present. No thyromegaly present.  Cardiovascular: Normal rate, regular rhythm and normal heart sounds.   Pulmonary/Chest: No stridor. No respiratory distress. She has no wheezes.  Abdominal: Soft. Bowel  sounds are normal. She exhibits no distension and no mass. There is no tenderness. There is no rebound and no guarding.  Musculoskeletal: She exhibits edema (L foot 1+, R -). She exhibits no tenderness.  Lymphadenopathy:    She has no cervical adenopathy.  Neurological: She displays normal reflexes. No cranial nerve deficit. She exhibits normal muscle tone. Coordination normal.  Skin: No rash noted. No erythema.  Psychiatric: Her behavior is normal. Judgment and thought content normal.    Lab Results  Component Value Date   WBC 6.7 08/13/2013   HGB 15.6* 08/13/2013   HCT 45.8 08/13/2013   PLT 143.0* 08/13/2013   GLUCOSE 136* 12/16/2013   CHOL 131 12/16/2013   TRIG 69.0 12/16/2013   HDL 46.90 12/16/2013   LDLCALC 70 12/16/2013   ALT 15 12/16/2013   AST 20 12/16/2013   NA 136 12/16/2013   K 3.8 12/16/2013   CL 98 12/16/2013   CREATININE 0.8 12/16/2013   BUN 16 12/16/2013   CO2 31 12/16/2013   TSH 0.49 08/21/2012   INR 1.15 08/10/2011   HGBA1C 5.8 10/12/2011      Assessment & Plan:

## 2013-12-23 NOTE — Assessment & Plan Note (Signed)
Resolved

## 2013-12-23 NOTE — Assessment & Plan Note (Signed)
Continue with current prescription therapy as reflected on the Med list.  

## 2013-12-24 LAB — VITAMIN B12: Vitamin B-12: 531 pg/mL (ref 211–911)

## 2013-12-25 LAB — METHYLMALONIC ACID, SERUM: METHYLMALONIC ACID, QUANT: 0.14 umol/L (ref <0.40)

## 2014-01-27 ENCOUNTER — Telehealth: Payer: Self-pay | Admitting: *Deleted

## 2014-01-27 ENCOUNTER — Telehealth: Payer: Self-pay | Admitting: Neurology

## 2014-01-27 MED ORDER — DONEPEZIL HCL 10 MG PO TABS: 10.0000 mg | ORAL_TABLET | Freq: Every day | ORAL | Status: DC

## 2014-01-27 NOTE — Telephone Encounter (Signed)
error 

## 2014-01-27 NOTE — Telephone Encounter (Signed)
Husband called stating Aricept 90 day supply was not called in at [facility]. Please call [phone] / Sherri S.

## 2014-01-27 NOTE — Telephone Encounter (Signed)
Aricept 10 mg ordered # 30 to [facility] and [facility] . This patient is not allergic to Aricept as stated above .

## 2014-02-02 ENCOUNTER — Other Ambulatory Visit: Payer: Self-pay | Admitting: *Deleted

## 2014-02-19 ENCOUNTER — Telehealth: Payer: Self-pay | Admitting: *Deleted

## 2014-02-19 NOTE — Telephone Encounter (Signed)
[facility] Triage Call Report Triage Record Num: [id number] Operator: Feliberto Harts Patient Name: [name] Call Date & Time: 02/19/2014 4:05:03PM Patient Phone: [phone] PCP: Patient Gender: Female PCP Fax : Patient DOB: 09-03-1936 Practice Name: [facility] Reason for Call: Caller: [name]/Patient; PCP: [name] (Adults only); CB#: [phone]; Call regarding Diarrhea and vomiting; Started with diarrhea and vomiting at midnight 5/14. Sipping on fluids but very little. Taken in less than 1 cup fluids in 8 hrs. No appetite. Very weak. Triaged per Dehydration guideline. Disposition See Provider within 4 hours per New signs of dehydration and unable to replace fluids orally. Office contacted and was told to send pt. to UC. Will take pt to [facility]. Protocol(s) Used: Dehydration Recommended Outcome per Protocol: See Provider within 4 hours Reason for Outcome: New signs of dehydration and unable to replace fluid loss orally Care Advice: Take sips of clear liquids (such as water, clear fruit juices without pulp, soda, tea or coffee without dairy or non-dairy creamer, clear broth or bouillon, oral hydration solution, or plain gelatin, fruit ices/popsicles, hard candy) as tolerated. Sucking on ice chips is another option. ~ 02/19/2014 4:21:36PM Page 1 of 1 [id number]

## 2014-02-20 ENCOUNTER — Encounter (HOSPITAL_COMMUNITY): Payer: Self-pay | Admitting: Emergency Medicine

## 2014-02-20 ENCOUNTER — Emergency Department (HOSPITAL_COMMUNITY): Payer: Medicare Other

## 2014-02-20 ENCOUNTER — Inpatient Hospital Stay (HOSPITAL_COMMUNITY)
Admission: EM | Admit: 2014-02-20 | Discharge: 2014-02-23 | DRG: 392 | Disposition: A | Discharge status: Home / Self Care | Payer: Medicare Other | Attending: Family Medicine | Admitting: Family Medicine

## 2014-02-20 DIAGNOSIS — K529 Noninfective gastroenteritis and colitis, unspecified: Secondary | ICD-10-CM

## 2014-02-20 DIAGNOSIS — E785 Hyperlipidemia, unspecified: Secondary | ICD-10-CM | POA: Diagnosis present

## 2014-02-20 DIAGNOSIS — F411 Generalized anxiety disorder: Secondary | ICD-10-CM | POA: Diagnosis present

## 2014-02-20 DIAGNOSIS — Z8711 Personal history of peptic ulcer disease: Secondary | ICD-10-CM

## 2014-02-20 DIAGNOSIS — M899 Disorder of bone, unspecified: Secondary | ICD-10-CM | POA: Diagnosis present

## 2014-02-20 DIAGNOSIS — M949 Disorder of cartilage, unspecified: Secondary | ICD-10-CM

## 2014-02-20 DIAGNOSIS — Z885 Allergy status to narcotic agent status: Secondary | ICD-10-CM

## 2014-02-20 DIAGNOSIS — Z7982 Long term (current) use of aspirin: Secondary | ICD-10-CM | POA: Diagnosis not present

## 2014-02-20 DIAGNOSIS — F17200 Nicotine dependence, unspecified, uncomplicated: Secondary | ICD-10-CM | POA: Diagnosis present

## 2014-02-20 DIAGNOSIS — I739 Peripheral vascular disease, unspecified: Secondary | ICD-10-CM | POA: Diagnosis present

## 2014-02-20 DIAGNOSIS — K449 Diaphragmatic hernia without obstruction or gangrene: Secondary | ICD-10-CM | POA: Diagnosis present

## 2014-02-20 DIAGNOSIS — Z79899 Other long term (current) drug therapy: Secondary | ICD-10-CM | POA: Diagnosis not present

## 2014-02-20 DIAGNOSIS — J449 Chronic obstructive pulmonary disease, unspecified: Secondary | ICD-10-CM | POA: Diagnosis present

## 2014-02-20 DIAGNOSIS — Z8673 Personal history of transient ischemic attack (TIA), and cerebral infarction without residual deficits: Secondary | ICD-10-CM

## 2014-02-20 DIAGNOSIS — F0390 Unspecified dementia without behavioral disturbance: Secondary | ICD-10-CM | POA: Diagnosis present

## 2014-02-20 DIAGNOSIS — I10 Essential (primary) hypertension: Secondary | ICD-10-CM | POA: Diagnosis present

## 2014-02-20 DIAGNOSIS — E039 Hypothyroidism, unspecified: Secondary | ICD-10-CM | POA: Diagnosis present

## 2014-02-20 DIAGNOSIS — K5289 Other specified noninfective gastroenteritis and colitis: Principal | ICD-10-CM | POA: Diagnosis present

## 2014-02-20 DIAGNOSIS — Z888 Allergy status to other drugs, medicaments and biological substances status: Secondary | ICD-10-CM | POA: Diagnosis not present

## 2014-02-20 DIAGNOSIS — K219 Gastro-esophageal reflux disease without esophagitis: Secondary | ICD-10-CM | POA: Diagnosis present

## 2014-02-20 DIAGNOSIS — J4489 Other specified chronic obstructive pulmonary disease: Secondary | ICD-10-CM | POA: Diagnosis present

## 2014-02-20 LAB — CBC WITH DIFFERENTIAL/PLATELET
BASOS PCT: 0 % (ref 0–1)
Basophils Absolute: 0 10*3/uL (ref 0.0–0.1)
EOS ABS: 0 10*3/uL (ref 0.0–0.7)
Eosinophils Relative: 0 % (ref 0–5)
HEMATOCRIT: 47 % — AB (ref 36.0–46.0)
HEMOGLOBIN: 16.7 g/dL — AB (ref 12.0–15.0)
Lymphocytes Relative: 6 % — ABNORMAL LOW (ref 12–46)
Lymphs Abs: 1 10*3/uL (ref 0.7–4.0)
MCH: 31.3 pg (ref 26.0–34.0)
MCHC: 35.5 g/dL (ref 30.0–36.0)
MCV: 88 fL (ref 78.0–100.0)
MONO ABS: 0.7 10*3/uL (ref 0.1–1.0)
MONOS PCT: 4 % (ref 3–12)
Neutro Abs: 14.3 10*3/uL — ABNORMAL HIGH (ref 1.7–7.7)
Neutrophils Relative %: 90 % — ABNORMAL HIGH (ref 43–77)
Platelets: 191 10*3/uL (ref 150–400)
RBC: 5.34 MIL/uL — ABNORMAL HIGH (ref 3.87–5.11)
RDW: 12.2 % (ref 11.5–15.5)
WBC: 16 10*3/uL — ABNORMAL HIGH (ref 4.0–10.5)

## 2014-02-20 LAB — COMPREHENSIVE METABOLIC PANEL
ALBUMIN: 4.1 g/dL (ref 3.5–5.2)
ALT: 14 U/L (ref 0–35)
AST: 24 U/L (ref 0–37)
Alkaline Phosphatase: 67 U/L (ref 39–117)
BUN: 22 mg/dL (ref 6–23)
CO2: 30 mEq/L (ref 19–32)
CREATININE: 0.85 mg/dL (ref 0.50–1.10)
Calcium: 9.6 mg/dL (ref 8.4–10.5)
Chloride: 95 mEq/L — ABNORMAL LOW (ref 96–112)
GFR calc non Af Amer: 65 mL/min — ABNORMAL LOW (ref >90)
GFR, EST AFRICAN AMERICAN: 75 mL/min — AB (ref >90)
Glucose, Bld: 180 mg/dL — ABNORMAL HIGH (ref 70–99)
Potassium: 3.5 mEq/L — ABNORMAL LOW (ref 3.7–5.3)
Sodium: 140 mEq/L (ref 137–147)
TOTAL PROTEIN: 7 g/dL (ref 6.0–8.3)
Total Bilirubin: 0.7 mg/dL (ref 0.3–1.2)

## 2014-02-20 LAB — URINALYSIS, ROUTINE W REFLEX MICROSCOPIC
GLUCOSE, UA: NEGATIVE mg/dL
Hgb urine dipstick: NEGATIVE
Ketones, ur: 15 mg/dL — AB
NITRITE: NEGATIVE
PH: 6 (ref 5.0–8.0)
Protein, ur: 30 mg/dL — AB
Specific Gravity, Urine: 1.03 (ref 1.005–1.030)
Urobilinogen, UA: 1 mg/dL (ref 0.0–1.0)

## 2014-02-20 LAB — URINE MICROSCOPIC-ADD ON

## 2014-02-20 LAB — POC OCCULT BLOOD, ED: Fecal Occult Bld: NEGATIVE

## 2014-02-20 MED ORDER — ONDANSETRON HCL 4 MG/2ML IJ SOLN
4.0000 mg | Freq: Once | INTRAMUSCULAR | Status: AC
  Administered 2014-02-20: 4 mg via INTRAVENOUS
  Filled 2014-02-20: qty 2

## 2014-02-20 MED ORDER — SODIUM CHLORIDE 0.9 % IV SOLN
Freq: Once | INTRAVENOUS | Status: AC
Start: 2014-02-20 — End: 2014-02-20
  Administered 2014-02-20: 22:00:00 via INTRAVENOUS

## 2014-02-20 MED ORDER — IOHEXOL 300 MG/ML  SOLN
100.0000 mL | Freq: Once | INTRAMUSCULAR | Status: AC | PRN
  Administered 2014-02-20: 100 mL via INTRAVENOUS

## 2014-02-20 MED ORDER — LOPERAMIDE HCL 2 MG PO CAPS
4.0000 mg | ORAL_CAPSULE | ORAL | Status: DC | PRN
  Administered 2014-02-20: 4 mg via ORAL
  Filled 2014-02-20: qty 2

## 2014-02-20 MED ORDER — SODIUM CHLORIDE 0.9 % IV SOLN
Freq: Once | INTRAVENOUS | Status: AC
  Administered 2014-02-20: 18:00:00 via INTRAVENOUS

## 2014-02-20 MED ORDER — IOHEXOL 300 MG/ML  SOLN
25.0000 mL | INTRAMUSCULAR | Status: AC
  Administered 2014-02-20 (×2): 25 mL via ORAL

## 2014-02-20 MED ORDER — ONDANSETRON 4 MG PO TBDP
8.0000 mg | ORAL_TABLET | Freq: Once | ORAL | Status: AC
  Administered 2014-02-20: 8 mg via ORAL
  Filled 2014-02-20: qty 2

## 2014-02-20 NOTE — ED Notes (Signed)
Assisted pt to [facility].

## 2014-02-20 NOTE — ED Notes (Signed)
Called CT to let them know PT finished contrast 

## 2014-02-20 NOTE — ED Notes (Signed)
The pt has had nausea vomiting and diarrhea for 2 days.  Low po intake weakness

## 2014-02-20 NOTE — ED Notes (Signed)
Brought bedside commode to PT's room for quick access incase of lower GI need

## 2014-02-20 NOTE — ED Notes (Signed)
PT's husband reports onset of n/v/d Weds night, continuing through today. Denies fever, but PT had visible chills upon entering room. PT's husband also states that PT has been having intermittent HA and feels weak from all of the n/v/d

## 2014-02-20 NOTE — ED Notes (Signed)
Pt stated that she could not void at this time.  She is aware of need of UA.

## 2014-02-20 NOTE — ED Notes (Signed)
Walked PT to bathroom.

## 2014-02-20 NOTE — ED Provider Notes (Signed)
CSN: [id number]     Arrival date & time 02/20/14  1612 History   First MD Initiated Contact with Patient 02/20/14 1755     Chief Complaint  Patient presents with  . Emesis     (Consider location/radiation/quality/duration/timing/severity/associated sxs/prior Treatment) Patient is a 78 y.o. female presenting with vomiting. The history is provided by the patient.  Emesis  She complains of vomiting for 3 days, associated with abdominal pain. Abdominal pain is described as cramping and intermittent. She's not had a bowel movement in 3 days. She denies fever, productive cough, back, pain, weakness, or dizziness. She's taking her usual medications, without relief. There no other known modifying factors.  Past Medical History  Diagnosis Date  . GERD (gastroesophageal reflux disease)   . Peripheral vascular disease   . Hyperlipidemia   . Osteopenia   . Low back pain     OA R radiculopathy  . Lower extremity edema     chronic  . Personal history of colonic polyps 2001    TUBULAR ADENOMA - [name]  . Diverticulosis of colon (without mention of hemorrhage)   . COPD (chronic obstructive pulmonary disease)   . Hypertension   . Hiatal hernia   . Hypothyroidism   . Anxiety   . Peptic ulcer   . Gallstones   . Pneumonia   . Dementia    Past Surgical History  Procedure Laterality Date  . Abdominal hysterectomy    . Appendectomy    . Tubal ligation    . Cholecystectomy    . Carpal tunnel release Bilateral   . Rotator cuff repair Bilateral   . Neck surgery      bone and plate   . Abdominal adhesion surgery     Family History  Problem Relation Age of Onset  . Dementia Mother   . Hypertension Father   . Heart disease Father   . Colon cancer Sister   . Heart disease Sister   . Esophageal cancer Neg Hx   . Rectal cancer Neg Hx   . Stomach cancer Neg Hx    History  Substance Use Topics  . Smoking status: Current Every Day Smoker -- 1.50 packs/day    Types: Cigarettes  .  Smokeless tobacco: Never Used     Comment: Sheet given on smoking  . Alcohol Use: No   OB History   Grav Para Term Preterm Abortions TAB SAB Ect Mult Living                 Review of Systems  Gastrointestinal: Positive for vomiting.  All other systems reviewed and are negative.     Allergies  Aricept; Codeine sulfate; Exelon; and Oxycodone-acetaminophen  Home Medications   Prior to Admission medications   Medication Sig Start Date End Date Taking? Authorizing Provider  aspirin 81 MG EC tablet Take 81 mg by mouth daily.    Yes Historical Provider, MD  Cholecalciferol (VITAMIN D3) 1000 UNITS CAPS Take 1 capsule by mouth daily.     Yes Historical Provider, MD  Diclofenac Sodium (PENNSAID) 1.5 % SOLN Place 3-5 drops onto the skin 3 (three) times daily as needed. For pain applies on joints   Yes Historical Provider, MD  donepezil (ARICEPT) 10 MG tablet Take 1 tablet (10 mg total) by mouth at bedtime. 01/27/14  Yes Adam Melvern Sample, DO  levothyroxine (SYNTHROID, LEVOTHROID) 50 MCG tablet Take 50 mcg by mouth daily before breakfast.   Yes Historical Provider, MD  losartan-hydrochlorothiazide (HYZAAR) 50-12.5 MG per  tablet Take 1 tablet by mouth daily. 09/24/13  Yes Cassandria Anger, MD  Multiple Vitamin (MULTIVITAMIN) tablet Take 1 tablet by mouth daily.   Yes Historical Provider, MD  omeprazole (PRILOSEC) 40 MG capsule Take 40 mg by mouth daily.   Yes Historical Provider, MD  ondansetron (ZOFRAN) 4 MG tablet Take 1 tablet (4 mg total) by mouth every 8 (eight) hours as needed. Take  1 tab every 6 hours as needed for nausea and vomiting. 10/28/13  Yes Sable Feil, MD  potassium chloride (KLOR-CON 10) 10 MEQ tablet Take 1 tablet (10 mEq total) by mouth daily. 09/18/13  Yes Evie Lacks Plotnikov, MD  simvastatin (ZOCOR) 40 MG tablet Take 40 mg by mouth daily.   Yes Historical Provider, MD   BP 148/82  Pulse 104  Temp(Src) 98.3 F (36.8 C) (Oral)  Resp 22  Ht 5' (1.524 m)  Wt 119  lb 6.4 oz (54.159 kg)  BMI 23.32 kg/m2  SpO2 95% Physical Exam  Nursing note and vitals reviewed. Constitutional: She is oriented to person, place, and time. She appears well-developed.  Elderly, frail  HENT:  Head: Normocephalic and atraumatic.  Eyes: Conjunctivae and EOM are normal. Pupils are equal, round, and reactive to light.  Neck: Normal range of motion and phonation normal. Neck supple.  Cardiovascular: Normal rate, regular rhythm and intact distal pulses.   Pulmonary/Chest: Effort normal and breath sounds normal. She exhibits no tenderness.  Abdominal: Soft. She exhibits no distension. There is tenderness (lower quadrant, mild). There is no guarding.  Musculoskeletal: Normal range of motion.  Neurological: She is alert and oriented to person, place, and time. She exhibits normal muscle tone.  Skin: Skin is warm and dry.  Psychiatric: She has a normal mood and affect. Her behavior is normal. Judgment and thought content normal.    ED Course  Procedures (including critical care time) Labs Review Labs Reviewed  CBC WITH DIFFERENTIAL - Abnormal; Notable for the following:    WBC 16.0 (*)    RBC 5.34 (*)    Hemoglobin 16.7 (*)    HCT 47.0 (*)    Neutrophils Relative % 90 (*)    Neutro Abs 14.3 (*)    Lymphocytes Relative 6 (*)    All other components within normal limits  COMPREHENSIVE METABOLIC PANEL - Abnormal; Notable for the following:    Potassium 3.5 (*)    Chloride 95 (*)    Glucose, Bld 180 (*)    GFR calc non Af Amer 65 (*)    GFR calc Af Amer 75 (*)    All other components within normal limits  URINALYSIS, ROUTINE W REFLEX MICROSCOPIC - Abnormal; Notable for the following:    Color, Urine AMBER (*)    APPearance HAZY (*)    Bilirubin Urine SMALL (*)    Ketones, ur 15 (*)    Protein, ur 30 (*)    Leukocytes, UA TRACE (*)    All other components within normal limits  URINE MICROSCOPIC-ADD ON - Abnormal; Notable for the following:    Squamous Epithelial /  LPF FEW (*)    Bacteria, UA FEW (*)    All other components within normal limits  CBC  BASIC METABOLIC PANEL  POC OCCULT BLOOD, ED    Imaging Review Ct Abdomen Pelvis W Contrast  02/20/2014   CLINICAL DATA:  Nausea, vomiting and diarrhea.  EXAM: CT ABDOMEN AND PELVIS WITH CONTRAST  TECHNIQUE: Multidetector CT imaging of the abdomen and pelvis was performed  using the standard protocol following bolus administration of intravenous contrast.  CONTRAST:  168mL OMNIPAQUE IOHEXOL 300 MG/ML  SOLN  COMPARISON:  11/09/2011.  FINDINGS: Cholecystectomy clips. Interval diffuse low density wall thickening involving multiple small bowel loops, including the terminal ileum. There is also diffuse low density wall thickening involving the mid sigmoid colon, possibly previously present, difficult to assess due to differences in scan parameters. . This is an an area of multiple diverticula. No pericolonic soft tissue stranding.  Minimal free peritoneal fluid. No free peritoneal air. Unremarkable urinary bladder, kidneys, adrenal glands, spleen, pancreas and liver. No enlarged lymph nodes. Clear lung bases. Mild lumbar and lower thoracic spine degenerative changes. Stable L2 bone island.  IMPRESSION: 1. Interval extensive small bowel enteritis. This could be infectious or inflammatory in nature. 2. Interval minimal free peritoneal fluid. 3. Diffuse low density wall thickening in the mid sigmoid colon in an area of a large number of diverticula. This is most likely due to chronic diverticulosis. Correlation with sigmoidoscopy is recommended to exclude an underlying neoplasm.   Electronically Signed   By: Enrique Sack M.D.   On: 02/20/2014 23:21     EKG Interpretation None      MDM   Final diagnoses:  Enteritis    Nonspecific enteritis, with large area, small intestine involvement. No metabolic instability, or apparent sepsis.   Nursing Notes Reviewed/ Care Coordinated, and agree without changes. Applicable  Imaging Reviewed.  Interpretation of Laboratory Data incorporated into ED treatment  Plan: Admit    Richarda Blade, MD 02/21/14 [phone]

## 2014-02-21 DIAGNOSIS — K5289 Other specified noninfective gastroenteritis and colitis: Principal | ICD-10-CM

## 2014-02-21 DIAGNOSIS — K529 Noninfective gastroenteritis and colitis, unspecified: Secondary | ICD-10-CM | POA: Diagnosis present

## 2014-02-21 LAB — BASIC METABOLIC PANEL
BUN: 17 mg/dL (ref 6–23)
CALCIUM: 8.2 mg/dL — AB (ref 8.4–10.5)
CO2: 27 mEq/L (ref 19–32)
CREATININE: 0.86 mg/dL (ref 0.50–1.10)
Chloride: 105 mEq/L (ref 96–112)
GFR calc Af Amer: 74 mL/min — ABNORMAL LOW (ref >90)
GFR calc non Af Amer: 64 mL/min — ABNORMAL LOW (ref >90)
GLUCOSE: 95 mg/dL (ref 70–99)
Potassium: 4 mEq/L (ref 3.7–5.3)
SODIUM: 141 meq/L (ref 137–147)

## 2014-02-21 LAB — CBC
HCT: 40.7 % (ref 36.0–46.0)
HEMOGLOBIN: 14 g/dL (ref 12.0–15.0)
MCH: 31 pg (ref 26.0–34.0)
MCHC: 34.4 g/dL (ref 30.0–36.0)
MCV: 90.2 fL (ref 78.0–100.0)
Platelets: 149 10*3/uL — ABNORMAL LOW (ref 150–400)
RBC: 4.51 MIL/uL (ref 3.87–5.11)
RDW: 12.5 % (ref 11.5–15.5)
WBC: 10.6 10*3/uL — ABNORMAL HIGH (ref 4.0–10.5)

## 2014-02-21 MED ORDER — LOPERAMIDE HCL 2 MG PO CAPS: 2.0000 mg | ORAL_CAPSULE | ORAL | Status: DC | PRN

## 2014-02-21 MED ORDER — PANTOPRAZOLE SODIUM 40 MG PO TBEC
40.0000 mg | DELAYED_RELEASE_TABLET | Freq: Every day | ORAL | Status: DC
  Administered 2014-02-22 – 2014-02-23 (×2): 40 mg via ORAL
  Filled 2014-02-21 (×2): qty 1

## 2014-02-21 MED ORDER — PANTOPRAZOLE SODIUM 40 MG PO TBEC
80.0000 mg | DELAYED_RELEASE_TABLET | Freq: Every day | ORAL | Status: DC
  Administered 2014-02-21: 80 mg via ORAL
  Filled 2014-02-21: qty 2

## 2014-02-21 MED ORDER — METOCLOPRAMIDE HCL 5 MG/ML IJ SOLN
10.0000 mg | Freq: Once | INTRAMUSCULAR | Status: AC
  Administered 2014-02-21: 10 mg via INTRAVENOUS
  Filled 2014-02-21: qty 2

## 2014-02-21 MED ORDER — ASPIRIN EC 81 MG PO TBEC
81.0000 mg | DELAYED_RELEASE_TABLET | Freq: Every day | ORAL | Status: DC
  Administered 2014-02-21 – 2014-02-23 (×3): 81 mg via ORAL
  Filled 2014-02-21 (×3): qty 1

## 2014-02-21 MED ORDER — SODIUM CHLORIDE 0.9 % IV SOLN
INTRAVENOUS | Status: DC
  Administered 2014-02-21 – 2014-02-22 (×4): via INTRAVENOUS

## 2014-02-21 MED ORDER — DICLOFENAC SODIUM 1.5 % TD SOLN: 3.0000 [drp] | Freq: Three times a day (TID) | TRANSDERMAL | Status: DC | PRN

## 2014-02-21 MED ORDER — DIPHENHYDRAMINE HCL 50 MG/ML IJ SOLN
25.0000 mg | Freq: Once | INTRAMUSCULAR | Status: AC
  Administered 2014-02-21: 25 mg via INTRAVENOUS
  Filled 2014-02-21: qty 1

## 2014-02-21 MED ORDER — SODIUM CHLORIDE 0.9 % IV SOLN: INTRAVENOUS | Status: DC

## 2014-02-21 MED ORDER — SIMVASTATIN 40 MG PO TABS
40.0000 mg | ORAL_TABLET | Freq: Every day | ORAL | Status: DC
  Administered 2014-02-21 – 2014-02-22 (×2): 40 mg via ORAL
  Filled 2014-02-21 (×3): qty 1

## 2014-02-21 MED ORDER — FLORANEX PO PACK
1.0000 g | PACK | Freq: Three times a day (TID) | ORAL | Status: DC
  Administered 2014-02-21 – 2014-02-23 (×7): 1 g via ORAL
  Filled 2014-02-21 (×9): qty 1

## 2014-02-21 MED ORDER — LEVOTHYROXINE SODIUM 50 MCG PO TABS
50.0000 ug | ORAL_TABLET | Freq: Every day | ORAL | Status: DC
  Administered 2014-02-21 – 2014-02-23 (×3): 50 ug via ORAL
  Filled 2014-02-21 (×4): qty 1

## 2014-02-21 MED ORDER — HEPARIN SODIUM (PORCINE) 5000 UNIT/ML IJ SOLN
5000.0000 [IU] | Freq: Three times a day (TID) | INTRAMUSCULAR | Status: DC
  Administered 2014-02-21 – 2014-02-23 (×8): 5000 [IU] via SUBCUTANEOUS
  Filled 2014-02-21 (×11): qty 1

## 2014-02-21 MED ORDER — ONDANSETRON HCL 4 MG/2ML IJ SOLN
4.0000 mg | Freq: Four times a day (QID) | INTRAMUSCULAR | Status: DC | PRN
  Administered 2014-02-21 – 2014-02-22 (×3): 4 mg via INTRAVENOUS
  Filled 2014-02-21 (×4): qty 2

## 2014-02-21 MED ORDER — MORPHINE SULFATE 2 MG/ML IJ SOLN
2.0000 mg | INTRAMUSCULAR | Status: DC | PRN
  Administered 2014-02-21 – 2014-02-22 (×4): 2 mg via INTRAVENOUS
  Filled 2014-02-21 (×4): qty 1

## 2014-02-21 MED ORDER — POTASSIUM CHLORIDE CRYS ER 20 MEQ PO TBCR
40.0000 meq | EXTENDED_RELEASE_TABLET | Freq: Once | ORAL | Status: AC
  Administered 2014-02-21: 40 meq via ORAL
  Filled 2014-02-21: qty 2

## 2014-02-21 MED ORDER — DONEPEZIL HCL 10 MG PO TABS
10.0000 mg | ORAL_TABLET | Freq: Every day | ORAL | Status: DC
  Administered 2014-02-21 (×2): 10 mg via ORAL
  Filled 2014-02-21 (×4): qty 1

## 2014-02-21 NOTE — Progress Notes (Signed)
Pt admitted to unit from ED. Pt A&O & VS stable. Pt has few scabs on bottom and redness around anus. Pt oriented to unit, call bell within reach, and currently resting comfortably in bed. Will continue to monitor.

## 2014-02-21 NOTE — H&P (Signed)
[facility] History and Physical  [name] NID:[id number] DOB: 12/28/1935 DOA: 02/20/2014  Referring physician: EDP PCP: [name], MD   Chief Complaint: N/V/D   HPI: [name] is a 78 y.o. female who presents to the ED with 2 day history of N/V/D, fevers, and chills.  Because of this she has had decreased PO intake and weakness.  Review of Systems: Systems reviewed.  As above, otherwise negative  Past Medical History  Diagnosis Date  . GERD (gastroesophageal reflux disease)   . Peripheral vascular disease   . Hyperlipidemia   . Osteopenia   . Low back pain     OA R radiculopathy  . Lower extremity edema     chronic  . Personal history of colonic polyps 2001    TUBULAR ADENOMA - [name]  . Diverticulosis of colon (without mention of hemorrhage)   . COPD (chronic obstructive pulmonary disease)   . Hypertension   . Hiatal hernia   . Hypothyroidism   . Anxiety   . Peptic ulcer   . Gallstones   . Pneumonia   . Dementia    Past Surgical History  Procedure Laterality Date  . Abdominal hysterectomy    . Appendectomy    . Tubal ligation    . Cholecystectomy    . Carpal tunnel release Bilateral   . Rotator cuff repair Bilateral   . Neck surgery      bone and plate   . Abdominal adhesion surgery     Social History:  reports that she has been smoking Cigarettes.  She has been smoking about 1.50 packs per day. She has never used smokeless tobacco. She reports that she does not drink alcohol or use illicit drugs.  Allergies  Allergen Reactions  . Aricept [Donepezil Hydrochloride]     abd pain  . Codeine Sulfate Other (See Comments)    Extreme headaches  . Exelon [Rivastigmine Tartrate]     nausea  . Oxycodone-Acetaminophen Other (See Comments)    Extreme headaches    Family History  Problem Relation Age of Onset  . Dementia Mother   . Hypertension Father   . Heart disease Father   . Colon cancer Sister   . Heart disease  Sister   . Esophageal cancer Neg Hx   . Rectal cancer Neg Hx   . Stomach cancer Neg Hx      Prior to Admission medications   Medication Sig Start Date End Date Taking? Authorizing Provider  aspirin 81 MG EC tablet Take 81 mg by mouth daily.    Yes Historical Provider, MD  Cholecalciferol (VITAMIN D3) 1000 UNITS CAPS Take 1 capsule by mouth daily.     Yes Historical Provider, MD  Diclofenac Sodium (PENNSAID) 1.5 % SOLN Place 3-5 drops onto the skin 3 (three) times daily as needed. For pain applies on joints   Yes Historical Provider, MD  donepezil (ARICEPT) 10 MG tablet Take 1 tablet (10 mg total) by mouth at bedtime. 01/27/14  Yes Adam Melvern Sample, DO  levothyroxine (SYNTHROID, LEVOTHROID) 50 MCG tablet Take 50 mcg by mouth daily before breakfast.   Yes Historical Provider, MD  losartan-hydrochlorothiazide (HYZAAR) 50-12.5 MG per tablet Take 1 tablet by mouth daily. 09/24/13  Yes Cassandria Anger, MD  Multiple Vitamin (MULTIVITAMIN) tablet Take 1 tablet by mouth daily.   Yes Historical Provider, MD  omeprazole (PRILOSEC) 40 MG capsule Take 40 mg by mouth daily.   Yes Historical Provider, MD  ondansetron (ZOFRAN) 4 MG  tablet Take 1 tablet (4 mg total) by mouth every 8 (eight) hours as needed. Take  1 tab every 6 hours as needed for nausea and vomiting. 10/28/13  Yes Sable Feil, MD  potassium chloride (KLOR-CON 10) 10 MEQ tablet Take 1 tablet (10 mEq total) by mouth daily. 09/18/13  Yes Evie Lacks Plotnikov, MD  simvastatin (ZOCOR) 40 MG tablet Take 40 mg by mouth daily.   Yes Historical Provider, MD   Physical Exam: Filed Vitals:   02/21/14 0015  BP: 137/66  Pulse: 100  Temp:   Resp: 15    BP 137/66  Pulse 100  Temp(Src) 99.6 F (37.6 C) (Rectal)  Resp 15  SpO2 92%  General Appearance:    Alert, oriented, no distress, appears stated age  Head:    Normocephalic, atraumatic  Eyes:    PERRL, EOMI, sclera non-icteric        Nose:   Nares without drainage or epistaxis.  Mucosa, turbinates normal  Throat:   Moist mucous membranes. Oropharynx without erythema or exudate.  Neck:   Supple. No carotid bruits.  No thyromegaly.  No lymphadenopathy.   Back:     No CVA tenderness, no spinal tenderness  Lungs:     Clear to auscultation bilaterally, without wheezes, rhonchi or rales  Chest wall:    No tenderness to palpitation  Heart:    Regular rate and rhythm without murmurs, gallops, rubs  Abdomen:     Soft, non-tender, nondistended, normal bowel sounds, no organomegaly  Genitalia:    deferred  Rectal:    deferred  Extremities:   No clubbing, cyanosis or edema.  Pulses:   2+ and symmetric all extremities  Skin:   Skin color, texture, turgor normal, no rashes or lesions  Lymph nodes:   Cervical, supraclavicular, and axillary nodes normal  Neurologic:   CNII-XII intact. Normal strength, sensation and reflexes      throughout    Labs on Admission:  Basic Metabolic Panel:  Recent Labs Lab 02/20/14 1638  NA 140  K 3.5*  CL 95*  CO2 30  GLUCOSE 180*  BUN 22  CREATININE 0.85  CALCIUM 9.6   Liver Function Tests:  Recent Labs Lab 02/20/14 1638  AST 24  ALT 14  ALKPHOS 67  BILITOT 0.7  PROT 7.0  ALBUMIN 4.1   No results found for this basename: LIPASE, AMYLASE,  in the last 168 hours No results found for this basename: AMMONIA,  in the last 168 hours CBC:  Recent Labs Lab 02/20/14 1638  WBC 16.0*  NEUTROABS 14.3*  HGB 16.7*  HCT 47.0*  MCV 88.0  PLT 191   Cardiac Enzymes: No results found for this basename: CKTOTAL, CKMB, CKMBINDEX, TROPONINI,  in the last 168 hours  BNP (last 3 results) No results found for this basename: PROBNP,  in the last 8760 hours CBG: No results found for this basename: GLUCAP,  in the last 168 hours  Radiological Exams on Admission: Ct Abdomen Pelvis W Contrast  02/20/2014   CLINICAL DATA:  Nausea, vomiting and diarrhea.  EXAM: CT ABDOMEN AND PELVIS WITH CONTRAST  TECHNIQUE: Multidetector CT imaging of the  abdomen and pelvis was performed using the standard protocol following bolus administration of intravenous contrast.  CONTRAST:  117mL OMNIPAQUE IOHEXOL 300 MG/ML  SOLN  COMPARISON:  11/09/2011.  FINDINGS: Cholecystectomy clips. Interval diffuse low density wall thickening involving multiple small bowel loops, including the terminal ileum. There is also diffuse low density wall thickening involving the  mid sigmoid colon, possibly previously present, difficult to assess due to differences in scan parameters. . This is an an area of multiple diverticula. No pericolonic soft tissue stranding.  Minimal free peritoneal fluid. No free peritoneal air. Unremarkable urinary bladder, kidneys, adrenal glands, spleen, pancreas and liver. No enlarged lymph nodes. Clear lung bases. Mild lumbar and lower thoracic spine degenerative changes. Stable L2 bone island.  IMPRESSION: 1. Interval extensive small bowel enteritis. This could be infectious or inflammatory in nature. 2. Interval minimal free peritoneal fluid. 3. Diffuse low density wall thickening in the mid sigmoid colon in an area of a large number of diverticula. This is most likely due to chronic diverticulosis. Correlation with sigmoidoscopy is recommended to exclude an underlying neoplasm.   Electronically Signed   By: Enrique Sack M.D.   On: 02/20/2014 23:21    EKG: Independently reviewed.  Assessment/Plan Principal Problem:   Enteritis   1. Gastroenteritis - likely viral infection at this point.  Actually I suspect there may be a local outbreak of a GI virus given numerous different patients with ED visits with similar complaints over the past couple of days per my discussion with a couple of EDPs.  Supportive care at this point with focus on hydration, holding lisinopril HCTZ.     Code Status: Full Code  Family Communication: Husband at bedside Disposition Plan: Admit to obs  Time spent: 50 min  [facility] Pager  [phone]  If 7AM-7PM, please contact the day team taking care of the patient [email or web address] Password [facility] 02/21/2014, 12:38 AM

## 2014-02-21 NOTE — Progress Notes (Signed)
11:27 AM I agree with HPI/GPe and A/P per Dr. Alcario Drought  78 y/o female, known h/o ABLA c Lower GIB 08/09/11-showed small polyps, Recent EGD 10/29/13 neg [done for anorexia and weight loss,  Neg stress 2003, ? TIA in remote past, HTn, LE edema Admitted 5/16  2/7 h/o n/v/d-Ct scan showed small bowel enteritis + chronic diverticulosis Current symptoms seemed to have started on pressors and after eating chicken from [employer]. Husband ate the same food but did not have symptoms. This progressed to be perfused nausea vomiting and predominant diarrhea.  no real fevers or chills. No other sick contacts. No recent antibiotic exposure to suggest risk for C. difficile  No vomiting now. Tolerating some clear liquids no abdominal pain currently although feels bloated        HEENT alert oriented CHEST clinically clear no added sound CARDIAC S1-S2 no murmur rub or gallop ABDOMEN soft nontender but distended slight NEURO grossly intact   Patient Active Problem List   Diagnosis Date Noted  . Enteritis 02/21/2014  . Hypokalemia 09/24/2013  . Actinic keratoses 11/25/2012  . COPD (chronic obstructive pulmonary disease)   . Hypertension   . Loss of weight 08/22/2011  . Fatigue 08/22/2011  . Benign neoplasm of colon 08/09/2011  . Diverticulosis of colon (without mention of hemorrhage) 08/09/2011  . Family history of malignant neoplasm of gastrointestinal tract 08/08/2011  . Unexplained weight loss 08/08/2011  . Nausea 08/08/2011  . Dementia 08/08/2011  . HAND PAIN 05/30/2010  . SMOKER 08/03/2009  . NEOPLASM, SKIN, UNCERTAIN BEHAVIOR 57/26/2035  . Memory loss 01/27/2009  . Edema 06/08/2008  . HIP PAIN, RIGHT 06/04/2008  . BACK PAIN 06/04/2008  . OTHER MALAISE AND FATIGUE 11/18/2007  . IMPAIRED FASTING GLUCOSE 11/18/2007  . CHEST PAIN, UNSPECIFIED 10/22/2007  . Acute Bronchitis 10/07/2007  . HYPERLIPIDEMIA 07/24/2007  . OSTEOPENIA 07/24/2007  . HYPOTHYROIDISM 05/08/2007  . CAROTID ARTERY STENOSIS,  BILATERAL 05/08/2007  . PERIPHERAL VASCULAR DISEASE 05/08/2007  . GERD 05/08/2007  . MIGRAINES, HX OF 05/08/2007   We will continue current care with supportive management IV fluids and monitoring. She will probably start feeling better in the next 1-2 days and we can then plan for discharge. L. uptitrate her to inpatient status Because the diarrhea is nonbloody we will start Imodium 2 mg every 4 when necessary as well as lactobacillus. She is on high-dose Protonix which helped about 40 mg daily   Verneita Griffes, MD [facility] [phone]

## 2014-02-22 DIAGNOSIS — K5289 Other specified noninfective gastroenteritis and colitis: Secondary | ICD-10-CM | POA: Diagnosis not present

## 2014-02-22 LAB — COMPREHENSIVE METABOLIC PANEL
ALBUMIN: 3.1 g/dL — AB (ref 3.5–5.2)
ALT: 18 U/L (ref 0–35)
AST: 30 U/L (ref 0–37)
Alkaline Phosphatase: 56 U/L (ref 39–117)
BILIRUBIN TOTAL: 0.7 mg/dL (ref 0.3–1.2)
BUN: 9 mg/dL (ref 6–23)
CO2: 26 meq/L (ref 19–32)
CREATININE: 0.69 mg/dL (ref 0.50–1.10)
Calcium: 7.9 mg/dL — ABNORMAL LOW (ref 8.4–10.5)
Chloride: 103 mEq/L (ref 96–112)
GFR calc Af Amer: >90 mL/min (ref >90)
GFR, EST NON AFRICAN AMERICAN: 82 mL/min — AB (ref >90)
Glucose, Bld: 111 mg/dL — ABNORMAL HIGH (ref 70–99)
Potassium: 3.2 mEq/L — ABNORMAL LOW (ref 3.7–5.3)
Sodium: 141 mEq/L (ref 137–147)
Total Protein: 5.4 g/dL — ABNORMAL LOW (ref 6.0–8.3)

## 2014-02-22 MED ORDER — ALBUTEROL SULFATE HFA 108 (90 BASE) MCG/ACT IN AERS: 1.0000 | INHALATION_SPRAY | Freq: Four times a day (QID) | RESPIRATORY_TRACT | Status: DC | PRN

## 2014-02-22 MED ORDER — FUROSEMIDE 10 MG/ML IJ SOLN
40.0000 mg | Freq: Once | INTRAMUSCULAR | Status: AC
  Administered 2014-02-22: 40 mg via INTRAVENOUS
  Filled 2014-02-22: qty 4

## 2014-02-22 MED ORDER — NICOTINE 14 MG/24HR TD PT24
14.0000 mg | MEDICATED_PATCH | Freq: Every day | TRANSDERMAL | Status: DC
  Administered 2014-02-22 – 2014-02-23 (×2): 14 mg via TRANSDERMAL
  Filled 2014-02-22 (×2): qty 1

## 2014-02-22 MED ORDER — ALBUTEROL SULFATE (2.5 MG/3ML) 0.083% IN NEBU: 2.5000 mg | INHALATION_SOLUTION | Freq: Four times a day (QID) | RESPIRATORY_TRACT | Status: DC | PRN

## 2014-02-22 NOTE — Progress Notes (Signed)
Note: This document was prepared with digital dictation and possible smart phrase technology. Any transcriptional errors that result from this process are unintentional.   [name]:[id number] DOB: [birth year] DOA: 02/20/2014 PCP: [name], MD  Brief narrative: 78 y/o female, known h/o ABLA c Lower GIB 08/09/11-showed small polyps, Recent EGD 10/29/13 neg [done for anorexia and weight loss, Neg stress 2003, ? TIA in remote past, HTn, LE edema  Admitted 5/16 2/7 h/o n/v/d-Ct scan showed small bowel enteritis + chronic diverticulosis  Current symptoms seemed to have started on pressors and after eating chicken from [employer].   Past medical history-As per Problem list Chart reviewed as below- REVIEWED  Consultants:  NAD  Procedures:  NONE  Antibiotics:  NONE   Subjective   Fair.  Still some mild discomfort toabd. Stools soft.  Still a little weak   Objective    Interim History:   Telemetry:   Objective: Filed Vitals:   02/21/14 0552 02/21/14 1439 02/21/14 2106 02/22/14 0422  BP: 129/74 139/76 135/78 122/84  Pulse: 104 95 84 104  Temp: 98.6 F (37 C) 98.2 F (36.8 C) 98.3 F (36.8 C) 97.9 F (36.6 C)  TempSrc: Oral Oral Oral Oral  Resp: 20 20 18 18   Height:      Weight:      SpO2: 90% 96% 91% 91%    Intake/Output Summary (Last 24 hours) at 02/22/14 0959 Last data filed at 02/21/14 1300  Gross per 24 hour  Intake    120 ml  Output      0 ml  Net    120 ml    Exam:  General: alert pleasant and orietned Cardiovascular: s1 s2 no m/r/g Respiratory: clear, no added sound Abdomen: soft, NT, ND Skin nad Neuro intact  Data Reviewed: Basic Metabolic Panel:  Recent Labs Lab 02/20/14 1638 02/21/14 0517 02/22/14 0428  NA 140 141 141  K 3.5* 4.0 3.2*  CL 95* 105 103  CO2 30 27 26   GLUCOSE 180* 95 111*  BUN 22 17 9   CREATININE 0.85 0.86 0.69  CALCIUM 9.6 8.2* 7.9*   Liver Function Tests:  Recent Labs Lab 02/20/14 1638  02/22/14 0428  AST 24 30  ALT 14 18  ALKPHOS 67 56  BILITOT 0.7 0.7  PROT 7.0 5.4*  ALBUMIN 4.1 3.1*   No results found for this basename: LIPASE, AMYLASE,  in the last 168 hours No results found for this basename: AMMONIA,  in the last 168 hours CBC:  Recent Labs Lab 02/20/14 1638 02/21/14 0517  WBC 16.0* 10.6*  NEUTROABS 14.3*  --   HGB 16.7* 14.0  HCT 47.0* 40.7  MCV 88.0 90.2  PLT 191 149*   Cardiac Enzymes: No results found for this basename: CKTOTAL, CKMB, CKMBINDEX, TROPONINI,  in the last 168 hours BNP: No components found with this basename: POCBNP,  CBG: No results found for this basename: GLUCAP,  in the last 168 hours  No results found for this or any previous visit (from the past 240 hour(s)).   Studies:              All Imaging reviewed and is as per above notation   Scheduled Meds: . aspirin EC  81 mg Oral Daily  . donepezil  10 mg Oral QHS  . heparin  5,000 Units Subcutaneous 3 times per day  . lactobacillus  1 g Oral TID WC  . levothyroxine  50 mcg Oral QAC breakfast  . pantoprazole  40 mg Oral QAC breakfast  . simvastatin  40 mg Oral q1800   Continuous Infusions:    Assessment/Plan: 1. Viral GE-await resolution clinically-no indication for Abx as viral etiology.  Attempt PO trial today soft diet.  Cont Prn Imodium 2 mg q4 prn diarrhea, continue Lactobacillus 1 g tid to replace gut flroa 2. Prior TIA-stable at present. cont asa 81 mg 3. Hld-Continue Zocor 40 daily 4. Mild demnetia-cont aricept 10 daily 5. Hypothyroid-cont synthroid 50 mcg daily  Code Status: Full Family Communication: d/w husband at bedside Disposition Plan:  inpatient   Verneita Griffes, MD  [facility] Pager [phone] 02/22/2014, 9:59 AM    LOS: 2 days

## 2014-02-22 NOTE — Progress Notes (Signed)
Pt developed increased expiratory wheezing on left side posteriorly and anteriorly upon assessment.  Pt receiving supplemental IV fluids.  Paged Dr. Verlon Au.  Orders to give IV lasix.  Will continue to monitor.

## 2014-02-23 DIAGNOSIS — K5289 Other specified noninfective gastroenteritis and colitis: Secondary | ICD-10-CM | POA: Diagnosis not present

## 2014-02-23 MED ORDER — ACETAMINOPHEN 500 MG PO TABS
500.0000 mg | ORAL_TABLET | Freq: Four times a day (QID) | ORAL | Status: DC | PRN
  Administered 2014-02-23: 500 mg via ORAL
  Filled 2014-02-23: qty 1

## 2014-02-23 MED ORDER — LOPERAMIDE HCL 2 MG PO CAPS: 2.0000 mg | ORAL_CAPSULE | ORAL | Status: DC | PRN

## 2014-02-23 NOTE — Care Management Note (Signed)
    Page 1 of 1   02/23/2014     5:09:26 PM CARE MANAGEMENT NOTE 02/23/2014  Patient:  [name], [name]   Account Number:  [id number]  Date Initiated:  02/23/2014  Documentation initiated by:  Tomi Bamberger  Subjective/Objective Assessment:   dx enteritis  admit- lives with spouse.     Action/Plan:   Anticipated DC Date:  02/23/2014   Anticipated DC Plan:  HOME/SELF CARE      DC Planning Services  CM consult      Choice offered to / List presented to:             Status of service:  Completed, signed off Medicare Important Message given?   (If response is "NO", the following Medicare IM given date fields will be blank) Date Medicare IM given:   Date Additional Medicare IM given:  02/23/2014  Discharge Disposition:  HOME/SELF CARE  Per UR Regulation:  Reviewed for med. necessity/level of care/duration of stay  If discussed at [place] of Stay Meetings, dates discussed:    Comments:  02/23/14 [place], BSN [phone] patient lives with spouse, pta indep.  No needs anticipated.   Second IM given to patient.

## 2014-02-23 NOTE — Progress Notes (Signed)
[name] to be D/C'd Home per MD order.  Discussed with the patient and all questions fully answered.    Medication List         aspirin 81 MG EC tablet  Take 81 mg by mouth daily.     donepezil 10 MG tablet  Commonly known as:  ARICEPT  Take 1 tablet (10 mg total) by mouth at bedtime.     levothyroxine 50 MCG tablet  Commonly known as:  SYNTHROID, LEVOTHROID  Take 50 mcg by mouth daily before breakfast.     loperamide 2 MG capsule  Commonly known as:  IMODIUM  Take 1 capsule (2 mg total) by mouth every 4 (four) hours as needed for diarrhea or loose stools.     losartan-hydrochlorothiazide 50-12.5 MG per tablet  Commonly known as:  HYZAAR  Take 1 tablet by mouth daily.     multivitamin tablet  Take 1 tablet by mouth daily.     omeprazole 40 MG capsule  Commonly known as:  PRILOSEC  Take 40 mg by mouth daily.     ondansetron 4 MG tablet  Commonly known as:  ZOFRAN  Take 1 tablet (4 mg total) by mouth every 8 (eight) hours as needed. Take  1 tab every 6 hours as needed for nausea and vomiting.     PENNSAID 1.5 % Soln  Generic drug:  Diclofenac Sodium  Place 3-5 drops onto the skin 3 (three) times daily as needed. For pain applies on joints     potassium chloride 10 MEQ tablet  Commonly known as:  KLOR-CON 10  Take 1 tablet (10 mEq total) by mouth daily.     simvastatin 40 MG tablet  Commonly known as:  ZOCOR  Take 40 mg by mouth daily.     Vitamin D3 1000 UNITS Caps  Take 1 capsule by mouth daily.        VVS, Skin clean, dry and intact without evidence of skin break down, no evidence of skin tears noted. IV catheter discontinued intact. Site without signs and symptoms of complications. Dressing and pressure applied.  An After Visit Summary was printed and given to the patient.  D/c education completed with patient/family including follow up instructions, medication list, d/c activities limitations if indicated, with other d/c instructions as  indicated by MD - patient able to verbalize understanding, all questions fully answered.   Patient instructed to return to ED, call 911, or call MD for any changes in condition.   Patient escorted via [place], and D/C home via private auto.  [name] 02/23/2014 1:07 PM

## 2014-02-23 NOTE — Progress Notes (Signed)
Pt with complaint of n/v. Pt was given 4mg  of zofran IV. Pt states she can't take her aricept at this time. Continued to monitor pt. Pt was unable to take any po pills because of feeling nauseous after several attempts to get pt to take medicine.

## 2014-02-23 NOTE — Discharge Summary (Signed)
Physician Discharge Summary  [name] BPZ:[id number] DOB: 1935/11/28 DOA: 02/20/2014  PCP: [name], MD  Admit date: 02/20/2014 Discharge date: 02/23/2014  Time spent: 24 minutes  Recommendations for Outpatient Follow-up:  1. Follow with pcp 2. Needs further w/o GU  issues nonemergently 3. Continue other chronic meds  4. Consider using none thiazide BP meds 5. Get OP tsh in 3 weeks  Discharge Diagnoses:  Principal Problem:   Enteritis Active Problems:   Gastroenteritis   Discharge Condition: good  Diet recommendation: regular  Filed Weights   02/21/14 0053  Weight: 54.159 kg (119 lb 6.4 oz)    History of present illness:  78 y/o female, known h/o ABLA c Lower GIB 08/09/11-showed small polyps, Recent EGD 10/29/13 neg [done for anorexia and weight loss, Neg stress 2003, ? TIA in remote past, HTn, LE edema  Admitted 5/16 2/7 h/o n/v/d-Ct scan showed small bowel enteritis + chronic diverticulosis  Current symptoms seemed to have started on pressors and after eating chicken from [employer]. She was initially kept on clear liquids and graduated to a regular diet, although she had some n on this which is common for her as she take Zofran at home Diarrhea imporved and her labs stabilized although noted mild TCP She was very interested in going home and she was counslled about warning signs of perssiitng n/v/d   Discharge Exam: Filed Vitals:   02/23/14 0443  BP: 155/94  Pulse: 91  Temp: 98 F (36.7 C)  Resp: 18    General: alert pleasant oriented Cardiovascular: s1 s2 no m/r/g Respiratory: clear  Discharge Instructions You were cared for by a hospitalist during your hospital stay. If you have any questions about your discharge medications or the care you received while you were in the hospital after you are discharged, you can call the unit and asked to speak with the hospitalist on call if the hospitalist that took care of you is not available. Once you are  discharged, your primary care physician will handle any further medical issues. Please note that NO REFILLS for any discharge medications will be authorized once you are discharged, as it is imperative that you return to your primary care physician (or establish a relationship with a primary care physician if you do not have one) for your aftercare needs so that they can reassess your need for medications and monitor your lab values.  Discharge Instructions   Diet - low sodium heart healthy    Complete by:  As directed      Discharge instructions    Complete by:  As directed   Bland diet for 2-4 days Follow with reg Md in about 1 week Take tylenol for [place] luck!     Increase activity slowly    Complete by:  As directed             Medication List         aspirin 81 MG EC tablet  Take 81 mg by mouth daily.     donepezil 10 MG tablet  Commonly known as:  ARICEPT  Take 1 tablet (10 mg total) by mouth at bedtime.     levothyroxine 50 MCG tablet  Commonly known as:  SYNTHROID, LEVOTHROID  Take 50 mcg by mouth daily before breakfast.     loperamide 2 MG capsule  Commonly known as:  IMODIUM  Take 1 capsule (2 mg total) by mouth every 4 (four) hours as needed for diarrhea or loose stools.  losartan-hydrochlorothiazide 50-12.5 MG per tablet  Commonly known as:  HYZAAR  Take 1 tablet by mouth daily.     multivitamin tablet  Take 1 tablet by mouth daily.     omeprazole 40 MG capsule  Commonly known as:  PRILOSEC  Take 40 mg by mouth daily.     ondansetron 4 MG tablet  Commonly known as:  ZOFRAN  Take 1 tablet (4 mg total) by mouth every 8 (eight) hours as needed. Take  1 tab every 6 hours as needed for nausea and vomiting.     PENNSAID 1.5 % Soln  Generic drug:  Diclofenac Sodium  Place 3-5 drops onto the skin 3 (three) times daily as needed. For pain applies on joints     potassium chloride 10 MEQ tablet  Commonly known as:  KLOR-CON 10  Take 1 tablet (10 mEq  total) by mouth daily.     simvastatin 40 MG tablet  Commonly known as:  ZOCOR  Take 40 mg by mouth daily.     Vitamin D3 1000 UNITS Caps  Take 1 capsule by mouth daily.       Allergies  Allergen Reactions  . Aricept [Donepezil Hydrochloride]     abd pain  . Codeine Sulfate Other (See Comments)    Extreme headaches  . Exelon [Rivastigmine Tartrate]     nausea  . Oxycodone-Acetaminophen Other (See Comments)    Extreme headaches      The results of significant diagnostics from this hospitalization (including imaging, microbiology, ancillary and laboratory) are listed below for reference.    Significant Diagnostic Studies: Ct Abdomen Pelvis W Contrast  02/20/2014   CLINICAL DATA:  Nausea, vomiting and diarrhea.  EXAM: CT ABDOMEN AND PELVIS WITH CONTRAST  TECHNIQUE: Multidetector CT imaging of the abdomen and pelvis was performed using the standard protocol following bolus administration of intravenous contrast.  CONTRAST:  119mL OMNIPAQUE IOHEXOL 300 MG/ML  SOLN  COMPARISON:  11/09/2011.  FINDINGS: Cholecystectomy clips. Interval diffuse low density wall thickening involving multiple small bowel loops, including the terminal ileum. There is also diffuse low density wall thickening involving the mid sigmoid colon, possibly previously present, difficult to assess due to differences in scan parameters. . This is an an area of multiple diverticula. No pericolonic soft tissue stranding.  Minimal free peritoneal fluid. No free peritoneal air. Unremarkable urinary bladder, kidneys, adrenal glands, spleen, pancreas and liver. No enlarged lymph nodes. Clear lung bases. Mild lumbar and lower thoracic spine degenerative changes. Stable L2 bone island.  IMPRESSION: 1. Interval extensive small bowel enteritis. This could be infectious or inflammatory in nature. 2. Interval minimal free peritoneal fluid. 3. Diffuse low density wall thickening in the mid sigmoid colon in an area of a large number of  diverticula. This is most likely due to chronic diverticulosis. Correlation with sigmoidoscopy is recommended to exclude an underlying neoplasm.   Electronically Signed   By: Enrique Sack M.D.   On: 02/20/2014 23:21    Microbiology: No results found for this or any previous visit (from the past 240 hour(s)).   Labs: Basic Metabolic Panel:  Recent Labs Lab 02/20/14 1638 02/21/14 0517 02/22/14 0428  NA 140 141 141  K 3.5* 4.0 3.2*  CL 95* 105 103  CO2 30 27 26   GLUCOSE 180* 95 111*  BUN 22 17 9   CREATININE 0.85 0.86 0.69  CALCIUM 9.6 8.2* 7.9*   Liver Function Tests:  Recent Labs Lab 02/20/14 1638 02/22/14 0428  AST 24 30  ALT  14 18  ALKPHOS 67 56  BILITOT 0.7 0.7  PROT 7.0 5.4*  ALBUMIN 4.1 3.1*   No results found for this basename: LIPASE, AMYLASE,  in the last 168 hours No results found for this basename: AMMONIA,  in the last 168 hours CBC:  Recent Labs Lab 02/20/14 1638 02/21/14 0517  WBC 16.0* 10.6*  NEUTROABS 14.3*  --   HGB 16.7* 14.0  HCT 47.0* 40.7  MCV 88.0 90.2  PLT 191 149*   Cardiac Enzymes: No results found for this basename: CKTOTAL, CKMB, CKMBINDEX, TROPONINI,  in the last 168 hours BNP: BNP (last 3 results) No results found for this basename: PROBNP,  in the last 8760 hours CBG: No results found for this basename: GLUCAP,  in the last 168 hours     Signed:  Nita Sells  [facility] 02/23/2014, 12:21 PM

## 2014-03-04 ENCOUNTER — Encounter: Payer: Self-pay | Admitting: Internal Medicine

## 2014-03-04 ENCOUNTER — Ambulatory Visit (INDEPENDENT_AMBULATORY_CARE_PROVIDER_SITE_OTHER): Payer: Medicare Other | Admitting: Internal Medicine

## 2014-03-04 VITALS — BP 138/80 | HR 80 | Temp 98.0°F | Resp 16 | Wt 117.0 lb

## 2014-03-04 DIAGNOSIS — K5289 Other specified noninfective gastroenteritis and colitis: Secondary | ICD-10-CM

## 2014-03-04 DIAGNOSIS — F0390 Unspecified dementia without behavioral disturbance: Secondary | ICD-10-CM | POA: Diagnosis not present

## 2014-03-04 DIAGNOSIS — R110 Nausea: Secondary | ICD-10-CM

## 2014-03-04 DIAGNOSIS — E039 Hypothyroidism, unspecified: Secondary | ICD-10-CM

## 2014-03-04 DIAGNOSIS — E876 Hypokalemia: Secondary | ICD-10-CM

## 2014-03-04 DIAGNOSIS — K529 Noninfective gastroenteritis and colitis, unspecified: Secondary | ICD-10-CM

## 2014-03-04 MED ORDER — MEMANTINE HCL ER 28 MG PO CP24: 1.0000 | ORAL_CAPSULE | Freq: Every day | ORAL | Status: DC

## 2014-03-04 NOTE — Progress Notes (Signed)
   Subjective:     HPI  Hosp f/u:  Admit date: 02/20/2014  Discharge date: 02/23/2014  Time spent: 24 minutes  Recommendations for Outpatient Follow-up:  1. Follow with pcp 2. Needs further w/o GU issues nonemergently 3. Continue other chronic meds  4. Consider using none thiazide BP meds 5. Get OP tsh in 3 weeks Discharge Diagnoses:  Principal Problem:  Enteritis  Active Problems:  Gastroenteritis  Discharge Condition: good   The patient presents for a follow-up of  chronic abd pain and poor appetite, hypertension, chronic dyslipidemia, dementia. GI sx's better off Aricept. No foot swelling She sa Dr Tomi Likens.  Wt Readings from Last 3 Encounters:  03/04/14 117 lb (53.071 kg)  02/21/14 119 lb 6.4 oz (54.159 kg)  12/23/13 118 lb (53.524 kg)   BP Readings from Last 3 Encounters:  03/04/14 138/80  02/23/14 155/94  12/23/13 140/70      Review of Systems  Constitutional: Negative for chills, activity change, appetite change, fatigue and unexpected weight change.  HENT: Negative for congestion, mouth sores and sinus pressure.   Eyes: Negative for visual disturbance.  Respiratory: Negative for cough, chest tightness and shortness of breath.   Gastrointestinal: Negative for nausea, abdominal pain and abdominal distention.  Genitourinary: Negative for frequency, difficulty urinating and vaginal pain.  Musculoskeletal: Negative for back pain, gait problem and myalgias.  Skin: Negative for pallor and rash.  Neurological: Negative for dizziness, tremors, weakness, numbness and headaches.  Psychiatric/Behavioral: Negative for suicidal ideas, confusion and sleep disturbance. The patient is nervous/anxious.        Objective:   Physical Exam  Constitutional: She appears well-developed. No distress.  HENT:  Head: Normocephalic.  Right Ear: External ear normal.  Left Ear: External ear normal.  Nose: Nose normal.  Mouth/Throat: Oropharynx is clear and moist.  Eyes:  Conjunctivae are normal. Pupils are equal, round, and reactive to light. Right eye exhibits no discharge. Left eye exhibits no discharge.  Neck: Normal range of motion. Neck supple. No JVD present. No tracheal deviation present. No thyromegaly present.  Cardiovascular: Normal rate, regular rhythm and normal heart sounds.   Pulmonary/Chest: No stridor. No respiratory distress. She has no wheezes.  Abdominal: Soft. Bowel sounds are normal. She exhibits no distension and no mass. There is no tenderness. There is no rebound and no guarding.  Musculoskeletal: She exhibits edema (L foot 1+, R -). She exhibits no tenderness.  Lymphadenopathy:    She has no cervical adenopathy.  Neurological: She displays normal reflexes. No cranial nerve deficit. She exhibits normal muscle tone. Coordination normal.  Skin: No rash noted. No erythema.  Psychiatric: Her behavior is normal. Judgment and thought content normal.    Lab Results  Component Value Date   WBC 10.6* 02/21/2014   HGB 14.0 02/21/2014   HCT 40.7 02/21/2014   PLT 149* 02/21/2014   GLUCOSE 111* 02/22/2014   CHOL 131 12/16/2013   TRIG 69.0 12/16/2013   HDL 46.90 12/16/2013   LDLCALC 70 12/16/2013   ALT 18 02/22/2014   AST 30 02/22/2014   NA 141 02/22/2014   K 3.2* 02/22/2014   CL 103 02/22/2014   CREATININE 0.69 02/22/2014   BUN 9 02/22/2014   CO2 26 02/22/2014   TSH 0.49 08/21/2012   INR 1.15 08/10/2011   HGBA1C 5.8 10/12/2011      Assessment & Plan:

## 2014-03-04 NOTE — Progress Notes (Signed)
Pre visit review using our clinic review tool, if applicable. No additional management support is needed unless otherwise documented below in the visit note. 

## 2014-03-04 NOTE — Assessment & Plan Note (Signed)
D/c Aricept

## 2014-03-04 NOTE — Assessment & Plan Note (Signed)
Sx's resolving [facility] records reviewed

## 2014-03-04 NOTE — Assessment & Plan Note (Signed)
D/c Aricept Start Namenda

## 2014-03-04 NOTE — Patient Instructions (Signed)
Stop Aricept. Start Namenda in 1 week

## 2014-03-04 NOTE — Assessment & Plan Note (Signed)
Continue with current prescription therapy as reflected on the Med list.  

## 2014-03-25 ENCOUNTER — Ambulatory Visit: Payer: Medicare Other | Admitting: Internal Medicine

## 2014-04-02 ENCOUNTER — Telehealth: Payer: Self-pay | Admitting: *Deleted

## 2014-04-02 NOTE — Telephone Encounter (Signed)
Left msg on triage requesting call ack. Called pt back husband states wife is still having the nausea sxs. MD thought she was allergic to the aricept so he change to Namenda have taking for about 6 weeks now still nauseated. Made pt a f/u appt to discuss sxs...Johny Chess

## 2014-04-07 ENCOUNTER — Ambulatory Visit (INDEPENDENT_AMBULATORY_CARE_PROVIDER_SITE_OTHER): Payer: Medicare Other | Admitting: Internal Medicine

## 2014-04-07 ENCOUNTER — Other Ambulatory Visit (INDEPENDENT_AMBULATORY_CARE_PROVIDER_SITE_OTHER): Payer: Medicare Other

## 2014-04-07 ENCOUNTER — Encounter: Payer: Self-pay | Admitting: Internal Medicine

## 2014-04-07 VITALS — BP 110/64 | HR 76 | Temp 97.6°F | Resp 16 | Wt 117.0 lb

## 2014-04-07 DIAGNOSIS — R110 Nausea: Secondary | ICD-10-CM | POA: Diagnosis not present

## 2014-04-07 DIAGNOSIS — E876 Hypokalemia: Secondary | ICD-10-CM

## 2014-04-07 LAB — BASIC METABOLIC PANEL
BUN: 18 mg/dL (ref 6–23)
CO2: 33 mEq/L — ABNORMAL HIGH (ref 19–32)
Calcium: 9.2 mg/dL (ref 8.4–10.5)
Chloride: 96 mEq/L (ref 96–112)
Creatinine, Ser: 0.8 mg/dL (ref 0.4–1.2)
GFR: 70 mL/min (ref >60.00)
Glucose, Bld: 148 mg/dL — ABNORMAL HIGH (ref 70–99)
POTASSIUM: 3.9 meq/L (ref 3.5–5.1)
SODIUM: 137 meq/L (ref 135–145)

## 2014-04-07 MED ORDER — LOSARTAN POTASSIUM 50 MG PO TABS: 50.0000 mg | ORAL_TABLET | Freq: Every day | ORAL | Status: DC

## 2014-04-07 NOTE — Progress Notes (Signed)
   Subjective:     HPI  C/o nausea - stopped potassium: better The patient presents for a follow-up of  chronic abd pain and poor appetite, hypertension, chronic dyslipidemia, dementia. GI sx's better off Aricept. No foot swelling She saw Dr Tomi Likens.  Wt Readings from Last 3 Encounters:  04/07/14 117 lb (53.071 kg)  03/04/14 117 lb (53.071 kg)  02/21/14 119 lb 6.4 oz (54.159 kg)   BP Readings from Last 3 Encounters:  04/07/14 110/64  03/04/14 138/80  02/23/14 155/94      Review of Systems  Constitutional: Negative for chills, activity change, appetite change, fatigue and unexpected weight change.  HENT: Negative for congestion, mouth sores and sinus pressure.   Eyes: Negative for visual disturbance.  Respiratory: Negative for cough, chest tightness and shortness of breath.   Gastrointestinal: Negative for nausea, abdominal pain and abdominal distention.  Genitourinary: Negative for frequency, difficulty urinating and vaginal pain.  Musculoskeletal: Negative for back pain, gait problem and myalgias.  Skin: Negative for pallor and rash.  Neurological: Negative for dizziness, tremors, weakness, numbness and headaches.  Psychiatric/Behavioral: Negative for suicidal ideas, confusion and sleep disturbance. The patient is nervous/anxious.        Objective:   Physical Exam  Constitutional: She appears well-developed. No distress.  HENT:  Head: Normocephalic.  Right Ear: External ear normal.  Left Ear: External ear normal.  Nose: Nose normal.  Mouth/Throat: Oropharynx is clear and moist.  Eyes: Conjunctivae are normal. Pupils are equal, round, and reactive to light. Right eye exhibits no discharge. Left eye exhibits no discharge.  Neck: Normal range of motion. Neck supple. No JVD present. No tracheal deviation present. No thyromegaly present.  Cardiovascular: Normal rate, regular rhythm and normal heart sounds.   Pulmonary/Chest: No stridor. No respiratory distress. She has no  wheezes.  Abdominal: Soft. Bowel sounds are normal. She exhibits no distension and no mass. There is no tenderness. There is no rebound and no guarding.  Musculoskeletal: She exhibits edema (L foot 1+, R -). She exhibits no tenderness.  Lymphadenopathy:    She has no cervical adenopathy.  Neurological: She displays normal reflexes. No cranial nerve deficit. She exhibits normal muscle tone. Coordination normal.  Skin: No rash noted. No erythema.  Psychiatric: Her behavior is normal. Judgment and thought content normal.    Lab Results  Component Value Date   WBC 10.6* 02/21/2014   HGB 14.0 02/21/2014   HCT 40.7 02/21/2014   PLT 149* 02/21/2014   GLUCOSE 111* 02/22/2014   CHOL 131 12/16/2013   TRIG 69.0 12/16/2013   HDL 46.90 12/16/2013   LDLCALC 70 12/16/2013   ALT 18 02/22/2014   AST 30 02/22/2014   NA 141 02/22/2014   K 3.2* 02/22/2014   CL 103 02/22/2014   CREATININE 0.69 02/22/2014   BUN 9 02/22/2014   CO2 26 02/22/2014   TSH 0.49 08/21/2012   INR 1.15 08/10/2011   HGBA1C 5.8 10/12/2011      Assessment & Plan:

## 2014-04-07 NOTE — Assessment & Plan Note (Signed)
2015 Recurrent x 4 years ?due to Potassium - stopped

## 2014-04-07 NOTE — Progress Notes (Signed)
Pre visit review using our clinic review tool, if applicable. No additional management support is needed unless otherwise documented below in the visit note. 

## 2014-04-20 ENCOUNTER — Telehealth: Payer: Self-pay | Admitting: Internal Medicine

## 2014-04-20 NOTE — Telephone Encounter (Signed)
Pt called stated Losartan and Nam[facility] Pt was given written Rx but it has to be fax the doctor office. Please advise

## 2014-04-20 NOTE — Telephone Encounter (Signed)
OK. Thx

## 2014-04-21 MED ORDER — MEMANTINE HCL ER 28 MG PO CP24: 1.0000 | ORAL_CAPSULE | Freq: Every day | ORAL | Status: DC

## 2014-04-21 MED ORDER — LOSARTAN POTASSIUM 50 MG PO TABS: 50.0000 mg | ORAL_TABLET | Freq: Every day | ORAL | Status: DC

## 2014-04-21 NOTE — Telephone Encounter (Signed)
Notified pt spoke with husband inform him [facility]...[name]

## 2014-05-18 ENCOUNTER — Telehealth: Payer: Self-pay | Admitting: Internal Medicine

## 2014-05-18 NOTE — Telephone Encounter (Signed)
Patient's husband is calling in regarding to the patient's Losartan medication that was called in. Please call back to advise.

## 2014-05-18 NOTE — Telephone Encounter (Signed)
Called [facility] he stated [facility] stated [facility] have been trying to contact md to get clarification on pt losartan. Inform [facility] will [facility]. Called them spoke with rep amber needing to clarify if pt taking Losartan 50/12.5 or plan losartan 50 mg. Inform them the 50/12.5 was d/c and md rx losartan 50 mg back on 7/16...Johny Chess

## 2014-05-20 ENCOUNTER — Other Ambulatory Visit: Payer: Self-pay | Admitting: Internal Medicine

## 2014-06-03 ENCOUNTER — Ambulatory Visit: Payer: Medicare Other | Admitting: Internal Medicine

## 2014-06-12 ENCOUNTER — Other Ambulatory Visit: Payer: Self-pay | Admitting: Internal Medicine

## 2014-06-23 ENCOUNTER — Ambulatory Visit (INDEPENDENT_AMBULATORY_CARE_PROVIDER_SITE_OTHER): Payer: Medicare Other | Admitting: Neurology

## 2014-06-23 ENCOUNTER — Encounter: Payer: Self-pay | Admitting: Neurology

## 2014-06-23 VITALS — BP 104/68 | HR 70 | Temp 98.1°F | Resp 16 | Ht 60.0 in | Wt 110.9 lb

## 2014-06-23 DIAGNOSIS — F0280 Dementia in other diseases classified elsewhere without behavioral disturbance: Secondary | ICD-10-CM | POA: Insufficient documentation

## 2014-06-23 DIAGNOSIS — G309 Alzheimer's disease, unspecified: Secondary | ICD-10-CM | POA: Diagnosis not present

## 2014-06-23 NOTE — Patient Instructions (Signed)
You seem to be doing well and everything seems stable, which is good.  Continue being active in the church.  Continue crossword puzzles.  Try to go out for walks together for exercise.  Continue the Aricept 10mg  at bedtime.  Follow up in 6 months or as needed.

## 2014-06-23 NOTE — Progress Notes (Signed)
NEUROLOGY FOLLOW UP OFFICE NOTE  [name] [id number]  HISTORY OF PRESENT ILLNESS: [name] is a 78 year old right-handed woman with history of GERD, peripheral vascular disease, hyperlipidemia, COPD, hypertension, hypothyroidism, cholecystectomy and anxiety who follows up for Alzheimer's disease.  She is accompanied by her husband.   UPDATE: She is taking Aricept 10mg .  She is doing well.  There have been no changes, positive or negative.  Everything is stable.  She continues to have short-term memory problems, but she is able to perform ADLs.  She quickly forgets what she reads, conversations and phone calls.  There has been no change in personality or behavior.  She denies depression.  She and her husband are still very active in the church.  There have been no hallucinations or delusions.  HISTORY: Her husband began to notice changes in memory about 2 or 3 years ago.  She would have problems with short-term memory.  She would forget about phone calls.  She would repeat questions.  On two occasions, the last one about a month ago, she got lost on a familiar route.  She has to call her husband who tries to redirect her via the phone.  Still drives, usually only to church.  Her husband usually goes to the grocery store.  She has no difficulty remembering names or faces.  She has no hallucinations or delusions.  She sleeps well.  She is not depressed.  There has been no change in personality or behavior.  She is able to perform all her ADLs.  During the day, she tends to the house but spends time watching TV.  She reads the Bible but not everyday.  She and her husband are active in the church.  They go to choir practice every Wednesday night.  They participate in a senior group once a month.  Her mother and maternal aunt had dementia.  She has history of nausea and mild anorexia.  No vomiting.  She had been on both Aricept and rivastigmine at one time.  They were  discontinued with concern that it was causing the the nausea.  However, the nausea never resolved.  She has been evaluated by GI with no clear etiology.  She denies visual disturbance, dizziness, lightheadedness or gait instability.  She does endorse a cough headache.  11/05/13 CT HEAD:  no bleed, mass lesions or acute infarcts seen.  Mild atrophy and atherosclerotic changes noted.  12/23/13 MMSE 24/30.  PAST MEDICAL HISTORY: Past Medical History  Diagnosis Date  . GERD (gastroesophageal reflux disease)   . Peripheral vascular disease   . Hyperlipidemia   . Osteopenia   . Low back pain     OA R radiculopathy  . Lower extremity edema     chronic  . Personal history of colonic polyps 2001    TUBULAR ADENOMA - [name]  . Diverticulosis of colon (without mention of hemorrhage)   . COPD (chronic obstructive pulmonary disease)   . Hypertension   . Hiatal hernia   . Hypothyroidism   . Anxiety   . Peptic ulcer   . Gallstones   . Pneumonia   . Dementia     MEDICATIONS: Current Outpatient Prescriptions on File Prior to Visit  Medication Sig Dispense Refill  . aspirin 81 MG EC tablet Take 81 mg by mouth daily.       . Cholecalciferol (VITAMIN D3) 1000 UNITS CAPS Take 1 capsule by mouth daily.        . Diclofenac  Sodium (PENNSAID) 1.5 % SOLN Place 3-5 drops onto the skin 3 (three) times daily as needed. For pain applies on joints      . furosemide (LASIX) 20 MG tablet TAKE 2 TABLETS DAILY  180 tablet  1  . levothyroxine (SYNTHROID, LEVOTHROID) 50 MCG tablet Take 50 mcg by mouth daily before breakfast.      . loperamide (IMODIUM) 2 MG capsule Take 1 capsule (2 mg total) by mouth every 4 (four) hours as needed for diarrhea or loose stools.  30 capsule  0  . losartan (COZAAR) 50 MG tablet Take 1 tablet (50 mg total) by mouth daily.  90 tablet  3  . Multiple Vitamin (MULTIVITAMIN) tablet Take 1 tablet by mouth daily.      Marland Kitchen omeprazole (PRILOSEC) 40 MG capsule Take 40 mg by mouth daily.      Marland Kitchen  omeprazole (PRILOSEC) 40 MG capsule TAKE 1 CAPSULE DAILY  90 capsule  1  . ondansetron (ZOFRAN) 4 MG tablet Take 1 tablet (4 mg total) by mouth every 8 (eight) hours as needed. Take  1 tab every 6 hours as needed for nausea and vomiting.  60 tablet  1  . simvastatin (ZOCOR) 40 MG tablet Take 40 mg by mouth daily.      . simvastatin (ZOCOR) 40 MG tablet TAKE 1 TABLET AT BEDTIME  90 tablet  1  . Memantine HCl ER (NAMENDA XR) 28 MG CP24 Take 28 mg by mouth daily.  90 capsule  3   No current facility-administered medications on file prior to visit.    ALLERGIES: Allergies  Allergen Reactions  . Aricept [Donepezil Hydrochloride]     abd pain  . Codeine Sulfate Other (See Comments)    Extreme headaches  . Exelon [Rivastigmine Tartrate]     nausea  . Oxycodone-Acetaminophen Other (See Comments)    Extreme headaches    FAMILY HISTORY: Family History  Problem Relation Age of Onset  . Dementia Mother   . Hypertension Father   . Heart disease Father   . Colon cancer Sister   . Heart disease Sister   . Esophageal cancer Neg Hx   . Rectal cancer Neg Hx   . Stomach cancer Neg Hx     SOCIAL HISTORY: History   Social History  . Marital Status: Married    Spouse Name: N/A    Number of Children: 4  . Years of Education: N/A   Occupational History  . retired    Social History Main Topics  . Smoking status: Current Every Day Smoker -- 1.50 packs/day    Types: Cigarettes  . Smokeless tobacco: Never Used     Comment: Sheet given on smoking  . Alcohol Use: No  . Drug Use: No  . Sexual Activity: No   Other Topics Concern  . Not on file   Social History Narrative  . No narrative on file    REVIEW OF SYSTEMS: Constitutional: No fevers, chills, or sweats, no generalized fatigue, change in appetite Eyes: No visual changes, double vision, eye pain Ear, nose and throat: No hearing loss, ear pain, nasal congestion, sore throat Cardiovascular: No chest pain,  palpitations Respiratory:  No shortness of breath at rest or with exertion, wheezes GastrointestinaI: No nausea, vomiting, diarrhea, abdominal pain, fecal incontinence Genitourinary:  No dysuria, urinary retention or frequency Musculoskeletal:  No neck pain, back pain Integumentary: No rash, pruritus, skin lesions Neurological: as above Psychiatric: No depression, insomnia, anxiety Endocrine: No palpitations, fatigue, diaphoresis, mood swings,  change in appetite, change in weight, increased thirst Hematologic/Lymphatic:  No anemia, purpura, petechiae. Allergic/Immunologic: no itchy/runny eyes, nasal congestion, recent allergic reactions, rashes  PHYSICAL EXAM: Filed Vitals:   06/23/14 1011  BP: 104/68  Pulse: 70  Temp: 98.1 F (36.7 C)  Resp: 16   General: No acute distress Head:  Normocephalic/atraumatic Neck: supple, no paraspinal tenderness, full range of motion Heart:  Regular rate and rhythm Lungs:  Clear to auscultation bilaterally Back: No paraspinal tenderness Neurological Exam: alert and oriented to person and place, but some disorientation to time. Attention span and concentration intact, remote memory intact, recall poor, fund of knowledge intact.   MMSE - Mini Mental State Exam 06/23/2014  Orientation to time 2  Orientation to Place 5  Registration 3  Attention/ Calculation 5  Recall 0  Language- name 2 objects 2  Language- repeat 1  Language- follow 3 step command 3  Language- read & follow direction 1  Write a sentence 1  Copy design 1  Total score 24   Speech fluent and not dysarthric, language intact.  CN II-XII intact. Fundi not visualized.  Bulk and tone normal, muscle strength 5/5 throughout.  Sensation to light touch, temperature and vibration intact.  Deep tendon reflexes 2+ throughout except absent in ankles.  Finger to nose intact.  Gait normal, Romberg negative.  IMPRESSION: Alzheimer's dementia, stable.  PLAN: 1.  Continue Aricept 10mg  at  bedtime. 2.  Continue crossword puzzles and being active in the church 3.  Encouraged physical exercise such as regular walks. 4.  Follow up in 6 months  Metta Clines, DO  CC:  Lew Dawes, MD

## 2014-07-13 ENCOUNTER — Ambulatory Visit (INDEPENDENT_AMBULATORY_CARE_PROVIDER_SITE_OTHER): Payer: Medicare Other | Admitting: Internal Medicine

## 2014-07-13 ENCOUNTER — Encounter: Payer: Self-pay | Admitting: Internal Medicine

## 2014-07-13 VITALS — BP 170/96 | HR 80 | Temp 98.3°F | Resp 16 | Wt 122.0 lb

## 2014-07-13 DIAGNOSIS — R110 Nausea: Secondary | ICD-10-CM | POA: Diagnosis not present

## 2014-07-13 DIAGNOSIS — G309 Alzheimer's disease, unspecified: Secondary | ICD-10-CM | POA: Diagnosis not present

## 2014-07-13 DIAGNOSIS — Z23 Encounter for immunization: Secondary | ICD-10-CM | POA: Diagnosis not present

## 2014-07-13 DIAGNOSIS — I10 Essential (primary) hypertension: Secondary | ICD-10-CM | POA: Diagnosis not present

## 2014-07-13 DIAGNOSIS — R413 Other amnesia: Secondary | ICD-10-CM | POA: Diagnosis not present

## 2014-07-13 DIAGNOSIS — F0280 Dementia in other diseases classified elsewhere without behavioral disturbance: Secondary | ICD-10-CM

## 2014-07-13 DIAGNOSIS — R0989 Other specified symptoms and signs involving the circulatory and respiratory systems: Secondary | ICD-10-CM

## 2014-07-13 NOTE — Assessment & Plan Note (Signed)
Continue with current prescription therapy as reflected on the Med list.  

## 2014-07-13 NOTE — Assessment & Plan Note (Signed)
On Namenda XR

## 2014-07-13 NOTE — Assessment & Plan Note (Signed)
F/u w/Dr Jaffe 

## 2014-07-13 NOTE — Progress Notes (Signed)
   Subjective:     HPI  C/o nausea - stopped potassium: better. Losartan was stopped in 6/15 The patient presents for a follow-up of  chronic abd pain and poor appetite, hypertension, chronic dyslipidemia, dementia. GI sx's better off Aricept. No foot swelling She saw Dr Tomi Likens.  Wt Readings from Last 3 Encounters:  07/13/14 122 lb (55.339 kg)  06/23/14 110 lb 14.4 oz (50.304 kg)  04/07/14 117 lb (53.071 kg)   BP Readings from Last 3 Encounters:  07/13/14 170/96  06/23/14 104/68  04/07/14 110/64      Review of Systems  Constitutional: Negative for chills, activity change, appetite change, fatigue and unexpected weight change.  HENT: Negative for congestion, mouth sores and sinus pressure.   Eyes: Negative for visual disturbance.  Respiratory: Negative for cough, chest tightness and shortness of breath.   Gastrointestinal: Negative for nausea, abdominal pain and abdominal distention.  Genitourinary: Negative for frequency, difficulty urinating and vaginal pain.  Musculoskeletal: Negative for back pain, gait problem and myalgias.  Skin: Negative for pallor and rash.  Neurological: Negative for dizziness, tremors, weakness, numbness and headaches.  Psychiatric/Behavioral: Negative for suicidal ideas, confusion and sleep disturbance. The patient is nervous/anxious.        Objective:   Physical Exam  Constitutional: She appears well-developed. No distress.  HENT:  Head: Normocephalic.  Right Ear: External ear normal.  Left Ear: External ear normal.  Nose: Nose normal.  Mouth/Throat: Oropharynx is clear and moist.  Eyes: Conjunctivae are normal. Pupils are equal, round, and reactive to light. Right eye exhibits no discharge. Left eye exhibits no discharge.  Neck: Normal range of motion. Neck supple. No JVD present. No tracheal deviation present. No thyromegaly present.  Cardiovascular: Normal rate, regular rhythm and normal heart sounds.   Pulmonary/Chest: No stridor. No  respiratory distress. She has no wheezes.  Abdominal: Soft. Bowel sounds are normal. She exhibits no distension and no mass. There is no tenderness. There is no rebound and no guarding.  Musculoskeletal: She exhibits edema (L foot 1+, R -). She exhibits no tenderness.  Lymphadenopathy:    She has no cervical adenopathy.  Neurological: She displays normal reflexes. No cranial nerve deficit. She exhibits normal muscle tone. Coordination normal.  Skin: No rash noted. No erythema.  Psychiatric: Her behavior is normal. Judgment and thought content normal.  Mild R car bruit Alert, cooperative  Lab Results  Component Value Date   WBC 10.6* 02/21/2014   HGB 14.0 02/21/2014   HCT 40.7 02/21/2014   PLT 149* 02/21/2014   GLUCOSE 148* 04/07/2014   CHOL 131 12/16/2013   TRIG 69.0 12/16/2013   HDL 46.90 12/16/2013   LDLCALC 70 12/16/2013   ALT 18 02/22/2014   AST 30 02/22/2014   NA 137 04/07/2014   K 3.9 04/07/2014   CL 96 04/07/2014   CREATININE 0.8 04/07/2014   BUN 18 04/07/2014   CO2 33* 04/07/2014   TSH 0.49 08/21/2012   INR 1.15 08/10/2011   HGBA1C 5.8 10/12/2011      Assessment & Plan:

## 2014-07-13 NOTE — Assessment & Plan Note (Signed)
Better  

## 2014-07-13 NOTE — Progress Notes (Signed)
Pre visit review using our clinic review tool, if applicable. No additional management support is needed unless otherwise documented below in the visit note. 

## 2014-07-13 NOTE — Assessment & Plan Note (Signed)
BP Readings from Last 3 Encounters:  07/13/14 170/96  06/23/14 104/68  04/07/14 110/64   Will re-start Losartan

## 2014-07-14 ENCOUNTER — Telehealth: Payer: Self-pay | Admitting: Internal Medicine

## 2014-07-14 NOTE — Telephone Encounter (Signed)
Pt called in and said that he wants a call back from the nurse today.  Pt was not really happy.  Wanted to talk about medication.  I tried to help him and he just wanted to talk to Dr Tessa [name] nurse Best number [phone]   Thank you

## 2014-07-14 NOTE — Telephone Encounter (Signed)
emmi mailed  °

## 2014-07-14 NOTE — Telephone Encounter (Signed)
Pt called in and said that he wants a call back from the nurse today. Pt was not really happy. Wanted to talk about medication. I tried to help him and he just wanted to talk to Dr Tessa [name] nurse  Best number [phone]  Thank you

## 2014-07-14 NOTE — Telephone Encounter (Signed)
Pt's husband called back today stating pt has been taking Losartan 50 mg 1 qam all along. She also stopped Aricept. He wanted MD aware of this because of abnormal BP reading at 07/13/14 OV.

## 2014-07-15 MED ORDER — LOSARTAN POTASSIUM 50 MG PO TABS: 50.0000 mg | ORAL_TABLET | Freq: Two times a day (BID) | ORAL | Status: DC

## 2014-07-15 NOTE — Telephone Encounter (Signed)
Pt informed . New Rx sent to [facility]

## 2014-07-15 NOTE — Telephone Encounter (Signed)
Noted Pls start Losartan 50 mg bid Thx

## 2014-07-21 ENCOUNTER — Other Ambulatory Visit: Payer: Self-pay | Admitting: Internal Medicine

## 2014-07-21 ENCOUNTER — Other Ambulatory Visit: Payer: Self-pay | Admitting: *Deleted

## 2014-07-21 DIAGNOSIS — R413 Other amnesia: Secondary | ICD-10-CM

## 2014-07-21 DIAGNOSIS — R0989 Other specified symptoms and signs involving the circulatory and respiratory systems: Secondary | ICD-10-CM

## 2014-07-27 ENCOUNTER — Ambulatory Visit (HOSPITAL_COMMUNITY): Payer: Medicare Other | Attending: Cardiology | Admitting: Cardiology

## 2014-07-27 DIAGNOSIS — R0989 Other specified symptoms and signs involving the circulatory and respiratory systems: Secondary | ICD-10-CM | POA: Diagnosis not present

## 2014-07-27 DIAGNOSIS — J449 Chronic obstructive pulmonary disease, unspecified: Secondary | ICD-10-CM | POA: Insufficient documentation

## 2014-07-27 DIAGNOSIS — I6523 Occlusion and stenosis of bilateral carotid arteries: Secondary | ICD-10-CM | POA: Diagnosis not present

## 2014-07-27 DIAGNOSIS — E785 Hyperlipidemia, unspecified: Secondary | ICD-10-CM | POA: Insufficient documentation

## 2014-07-27 DIAGNOSIS — Z720 Tobacco use: Secondary | ICD-10-CM | POA: Diagnosis not present

## 2014-07-27 DIAGNOSIS — I739 Peripheral vascular disease, unspecified: Secondary | ICD-10-CM | POA: Diagnosis not present

## 2014-07-27 DIAGNOSIS — R413 Other amnesia: Secondary | ICD-10-CM

## 2014-07-27 NOTE — Progress Notes (Signed)
Carotid duplex performed 

## 2014-10-11 ENCOUNTER — Other Ambulatory Visit: Payer: Self-pay | Admitting: Internal Medicine

## 2014-10-14 ENCOUNTER — Encounter: Payer: Self-pay | Admitting: Internal Medicine

## 2014-10-14 ENCOUNTER — Ambulatory Visit (INDEPENDENT_AMBULATORY_CARE_PROVIDER_SITE_OTHER): Payer: Medicare Other | Admitting: Internal Medicine

## 2014-10-14 VITALS — BP 110/70 | HR 80 | Temp 97.6°F | Resp 16 | Ht 61.0 in | Wt 124.0 lb

## 2014-10-14 DIAGNOSIS — J449 Chronic obstructive pulmonary disease, unspecified: Secondary | ICD-10-CM

## 2014-10-14 DIAGNOSIS — L570 Actinic keratosis: Secondary | ICD-10-CM | POA: Diagnosis not present

## 2014-10-14 DIAGNOSIS — G309 Alzheimer's disease, unspecified: Secondary | ICD-10-CM | POA: Diagnosis not present

## 2014-10-14 DIAGNOSIS — Z23 Encounter for immunization: Secondary | ICD-10-CM | POA: Diagnosis not present

## 2014-10-14 DIAGNOSIS — J209 Acute bronchitis, unspecified: Secondary | ICD-10-CM | POA: Diagnosis not present

## 2014-10-14 DIAGNOSIS — F0280 Dementia in other diseases classified elsewhere without behavioral disturbance: Secondary | ICD-10-CM | POA: Diagnosis not present

## 2014-10-14 MED ORDER — AZITHROMYCIN 250 MG PO TABS: ORAL_TABLET | ORAL | Status: DC

## 2014-10-14 MED ORDER — BENZONATATE 200 MG PO CAPS: 200.0000 mg | ORAL_CAPSULE | Freq: Two times a day (BID) | ORAL | Status: DC | PRN

## 2014-10-14 NOTE — Progress Notes (Signed)
   Subjective:     HPI  C/o cough  x2 weeks  F/u nausea - stopped potassium: better. Losartan was stopped in 6/15 The patient presents for a follow-up of  chronic abd pain and poor appetite, hypertension, chronic dyslipidemia, dementia. GI sx's better off Aricept. No foot swelling  She is seeing Dr Tomi Likens.  Wt Readings from Last 3 Encounters:  10/14/14 124 lb (56.246 kg)  07/13/14 122 lb (55.339 kg)  06/23/14 110 lb 14.4 oz (50.304 kg)   BP Readings from Last 3 Encounters:  10/14/14 110/70  07/13/14 170/96  06/23/14 104/68      Review of Systems  Constitutional: Negative for chills, activity change, appetite change, fatigue and unexpected weight change.  HENT: Negative for congestion, mouth sores and sinus pressure.   Eyes: Negative for visual disturbance.  Respiratory: Negative for cough, chest tightness and shortness of breath.   Gastrointestinal: Negative for nausea, abdominal pain and abdominal distention.  Genitourinary: Negative for frequency, difficulty urinating and vaginal pain.  Musculoskeletal: Negative for myalgias, back pain and gait problem.  Skin: Negative for pallor and rash.  Neurological: Negative for dizziness, tremors, weakness, numbness and headaches.  Psychiatric/Behavioral: Negative for suicidal ideas, confusion and sleep disturbance. The patient is nervous/anxious.        Objective:   Physical Exam  Constitutional: She appears well-developed. No distress.  HENT:  Head: Normocephalic.  Right Ear: External ear normal.  Left Ear: External ear normal.  Nose: Nose normal.  Mouth/Throat: Oropharynx is clear and moist.  Eyes: Conjunctivae are normal. Pupils are equal, round, and reactive to light. Right eye exhibits no discharge. Left eye exhibits no discharge.  Neck: Normal range of motion. Neck supple. No JVD present. No tracheal deviation present. No thyromegaly present.  Cardiovascular: Normal rate, regular rhythm and normal heart sounds.    Pulmonary/Chest: No stridor. No respiratory distress. She has no wheezes.  Abdominal: Soft. Bowel sounds are normal. She exhibits no distension and no mass. There is no tenderness. There is no rebound and no guarding.  Musculoskeletal: She exhibits no edema or tenderness.  Lymphadenopathy:    She has no cervical adenopathy.  Neurological: She displays normal reflexes. No cranial nerve deficit. She exhibits normal muscle tone. Coordination normal.  Skin: No rash noted. No erythema.  Psychiatric: She has a normal mood and affect. Her behavior is normal. Judgment and thought content normal.   Mild R car bruit Alert, cooperative  Lab Results  Component Value Date   WBC 10.6* 02/21/2014   HGB 14.0 02/21/2014   HCT 40.7 02/21/2014   PLT 149* 02/21/2014   GLUCOSE 148* 04/07/2014   CHOL 131 12/16/2013   TRIG 69.0 12/16/2013   HDL 46.90 12/16/2013   LDLCALC 70 12/16/2013   ALT 18 02/22/2014   AST 30 02/22/2014   NA 137 04/07/2014   K 3.9 04/07/2014   CL 96 04/07/2014   CREATININE 0.8 04/07/2014   BUN 18 04/07/2014   CO2 33* 04/07/2014   TSH 0.49 08/21/2012   INR 1.15 08/10/2011   HGBA1C 5.8 10/12/2011      Assessment & Plan:

## 2014-10-14 NOTE — Assessment & Plan Note (Signed)
Plans discussed

## 2014-10-18 NOTE — Assessment & Plan Note (Signed)
2011 2013 - Aricept intol Mild GI problems w/Exelon - tolerable  Dr Tomi Likens

## 2014-11-03 ENCOUNTER — Other Ambulatory Visit: Payer: Self-pay | Admitting: Internal Medicine

## 2014-12-20 ENCOUNTER — Other Ambulatory Visit: Payer: Self-pay | Admitting: Neurology

## 2014-12-22 ENCOUNTER — Ambulatory Visit (INDEPENDENT_AMBULATORY_CARE_PROVIDER_SITE_OTHER): Payer: Medicare Other | Admitting: Neurology

## 2014-12-22 ENCOUNTER — Encounter: Payer: Self-pay | Admitting: Neurology

## 2014-12-22 VITALS — BP 130/78 | HR 91 | Ht 61.0 in | Wt 114.0 lb

## 2014-12-22 DIAGNOSIS — F17200 Nicotine dependence, unspecified, uncomplicated: Secondary | ICD-10-CM | POA: Diagnosis not present

## 2014-12-22 DIAGNOSIS — F0280 Dementia in other diseases classified elsewhere without behavioral disturbance: Secondary | ICD-10-CM | POA: Diagnosis not present

## 2014-12-22 DIAGNOSIS — G309 Alzheimer's disease, unspecified: Secondary | ICD-10-CM | POA: Diagnosis not present

## 2014-12-22 DIAGNOSIS — F0150 Vascular dementia without behavioral disturbance: Secondary | ICD-10-CM | POA: Diagnosis not present

## 2014-12-22 NOTE — Patient Instructions (Addendum)
1.  Continue Aricept 10mg  daily and Namenda ER 28mg  daily 2.  I encourage you to continue remaining active in the church.  3.  Try to get out of the house at least once a day most days a week 4.  YOU SHOULD NOT BE DRIVING 5.  Try to work on quitting smoking 6.  Follow up in 6 months.

## 2014-12-22 NOTE — Progress Notes (Signed)
NEUROLOGY FOLLOW UP OFFICE NOTE  [name] [id number]  HISTORY OF PRESENT ILLNESS: [name] is a 79 year old right-handed woman with history of GERD, peripheral vascular disease, hyperlipidemia, COPD, hypertension, hypothyroidism, cholecystectomy and anxiety who follows up for Alzheimer's disease.  She is accompanied by her son who provides some history.   UPDATE: She is taking Aricept 10mg  and Namenda ER 28mg  daily.  She lives with her husband.  Her husband typically takes care of everything but he recently had a myocardial infarction.  She has not been keeping the house clean and has been hoarding.  She has been combative towards her son.  She denies depression.  She has not been driving. She has a pill box system which helps her take her medication correctly.  She doesn't always remember to eat.  She usually sits around the house watching TV and smoking 3 packs of cigarettes a day.  HISTORY: Her husband began to notice changes in memory about 3 years ago.  She would have problems with short-term memory.  She would forget about phone calls.  She would repeat questions. Her husband usually goes to the grocery store.  She has no difficulty remembering names or faces.  She has no hallucinations or delusions.  She sleeps well.  She is not depressed.  There has been no change in personality or behavior.  She is able to perform all her ADLs.  During the day, she tends to the house but spends time watching TV.  She reads the Bible but not everyday.  She and her husband are active in the church.  They go to choir practice every Wednesday night.  They participate in a senior group once a month.  Her mother and maternal aunt had dementia.  She has history of nausea and mild anorexia.  No vomiting.  She had been on both Aricept and rivastigmine at one time.  They were discontinued with concern that it was causing the the nausea.  However, the nausea never resolved.  She has been  evaluated by GI with no clear etiology.  She was subsequently restarted on Aricept and then Namenda.  11/05/13 CT HEAD:  no bleed, mass lesions or acute infarcts seen.  Mild atrophy and atherosclerotic changes noted.  PAST MEDICAL HISTORY: Past Medical History  Diagnosis Date  . GERD (gastroesophageal reflux disease)   . Peripheral vascular disease   . Hyperlipidemia   . Osteopenia   . Low back pain     OA R radiculopathy  . Lower extremity edema     chronic  . Personal history of colonic polyps 2001    TUBULAR ADENOMA - [name]  . Diverticulosis of colon (without mention of hemorrhage)   . COPD (chronic obstructive pulmonary disease)   . Hypertension   . Hiatal hernia   . Hypothyroidism   . Anxiety   . Peptic ulcer   . Gallstones   . Pneumonia   . Dementia     MEDICATIONS: Current Outpatient Prescriptions on File Prior to Visit  Medication Sig Dispense Refill  . aspirin 81 MG EC tablet Take 81 mg by mouth daily.     Marland Kitchen azithromycin (ZITHROMAX) 250 MG tablet As directed 6 tablet 0  . benzonatate (TESSALON) 200 MG capsule Take 1 capsule (200 mg total) by mouth 2 (two) times daily as needed for cough. 60 capsule 0  . Cholecalciferol (VITAMIN D3) 1000 UNITS CAPS Take 1 capsule by mouth daily.      . Diclofenac  Sodium (PENNSAID) 1.5 % SOLN Place 3-5 drops onto the skin 3 (three) times daily as needed. For pain applies on joints    . donepezil (ARICEPT) 10 MG tablet TAKE 1 TABLET AT BEDTIME 90 tablet 2  . furosemide (LASIX) 20 MG tablet TAKE 2 TABLETS DAILY 180 tablet 2  . loperamide (IMODIUM) 2 MG capsule Take 1 capsule (2 mg total) by mouth every 4 (four) hours as needed for diarrhea or loose stools. 30 capsule 0  . losartan (COZAAR) 50 MG tablet Take 1 tablet (50 mg total) by mouth 2 (two) times daily. 180 tablet 3  . Memantine HCl ER (NAMENDA XR) 28 MG CP24 Take 28 mg by mouth daily. 90 capsule 3  . Multiple Vitamin (MULTIVITAMIN) tablet Take 1 tablet by mouth daily.    Marland Kitchen  omeprazole (PRILOSEC) 40 MG capsule Take 40 mg by mouth daily.    Marland Kitchen omeprazole (PRILOSEC) 40 MG capsule TAKE 1 CAPSULE DAILY 90 capsule 0  . ondansetron (ZOFRAN) 4 MG tablet Take 1 tablet (4 mg total) by mouth every 8 (eight) hours as needed. Take  1 tab every 6 hours as needed for nausea and vomiting. 60 tablet 1  . simvastatin (ZOCOR) 40 MG tablet TAKE 1 TABLET AT BEDTIME 90 tablet 2  . SYNTHROID 50 MCG tablet TAKE 1 TABLET DAILY 90 tablet 3   No current facility-administered medications on file prior to visit.    ALLERGIES: Allergies  Allergen Reactions  . Aricept [Donepezil Hydrochloride]     abd pain  . Codeine Sulfate Other (See Comments)    Extreme headaches  . Exelon [Rivastigmine Tartrate]     nausea  . Oxycodone-Acetaminophen Other (See Comments)    Extreme headaches    FAMILY HISTORY: Family History  Problem Relation Age of Onset  . Dementia Mother   . Hypertension Father   . Heart disease Father   . Colon cancer Sister   . Heart disease Sister   . Esophageal cancer Neg Hx   . Rectal cancer Neg Hx   . Stomach cancer Neg Hx     SOCIAL HISTORY: History   Social History  . Marital Status: Married    Spouse Name: N/A  . Number of Children: 4  . Years of Education: N/A   Occupational History  . retired    Social History Main Topics  . Smoking status: Current Every Day Smoker -- 1.50 packs/day    Types: Cigarettes  . Smokeless tobacco: Never Used     Comment: Sheet given on smoking  . Alcohol Use: No  . Drug Use: No  . Sexual Activity: No   Other Topics Concern  . Not on file   Social History Narrative    REVIEW OF SYSTEMS: Constitutional: No fevers, chills, or sweats, no generalized fatigue, change in appetite Eyes: No visual changes, double vision, eye pain Ear, nose and throat: No hearing loss, ear pain, nasal congestion, sore throat Cardiovascular: No chest pain, palpitations Respiratory:  No shortness of breath at rest or with exertion,  wheezes GastrointestinaI: No nausea, vomiting, diarrhea, abdominal pain, fecal incontinence Genitourinary:  No dysuria, urinary retention or frequency Musculoskeletal:  No neck pain, back pain Integumentary: No rash, pruritus, skin lesions Neurological: as above Psychiatric: No depression, insomnia, anxiety Endocrine: No palpitations, fatigue, diaphoresis, mood swings, change in appetite, change in weight, increased thirst Hematologic/Lymphatic:  No anemia, purpura, petechiae. Allergic/Immunologic: no itchy/runny eyes, nasal congestion, recent allergic reactions, rashes  PHYSICAL EXAM: Filed Vitals:   12/22/14 1124  BP: 130/78  Pulse: 91   General: No acute distress Head:  Normocephalic/atraumatic Eyes:  Fundoscopic exam unremarkable without vessel changes, exudates, hemorrhages or papilledema. Neck: supple, no paraspinal tenderness, full range of motion Heart:  Regular rate and rhythm Lungs:  Clear to auscultation bilaterally Back: No paraspinal tenderness Neurological Exam: alert and oriented to person, place, and time. Attention span and concentration intact, short-term memory poor, remote memory intact, fund of knowledge intact.  Speech fluent and not dysarthric, language intact.   Montreal Cognitive Assessment  12/22/2014  Visuospatial/ Executive (0/5) 3  Naming (0/3) 2  Attention: Read list of digits (0/2) 2  Attention: Read list of letters (0/1) 1  Attention: Serial 7 subtraction starting at 100 (0/3) 3  Language: Repeat phrase (0/2) 2  Language : Fluency (0/1) 1  Abstraction (0/2) 0  Delayed Recall (0/5) 0  Orientation (0/6) 1  Total 15  Adjusted Score (based on education) 15   CN II-XII intact. Fundoscopic exam unremarkable without vessel changes, exudates, hemorrhages or papilledema.  Bulk and tone normal, muscle strength 5/5 throughout.  Sensation to light touch intact.  Deep tendon reflexes 2+ throughout.  Finger to nose  testing intact.  Gait normal, Romberg  negative.  IMPRESSION: Mixed vascular and Alzheimer's dementia Tobacco dependence.  PLAN: Continue Aricept 10mg  and Namenda ER 28mg  daily Continue ASA 81mg  daily She was informed in front of her son that she should not be driving. Encouraged to remain active in the church Should go for walks either around the block or in the mall Discussed importance of smoking cessation. Follow up in 6 months  30 minutes spent with patient, over 50% spent discussing management.  Metta Clines, DO  CC: Lew Dawes, MD

## 2014-12-23 ENCOUNTER — Telehealth: Payer: Self-pay | Admitting: Neurology

## 2014-12-23 NOTE — Telephone Encounter (Signed)
Pt seen yesterday and instructed by Dr. Tomi Likens to discontinue driving permanently. Pt's son, Rob Hickman, called to see if we forward a report of anything to the [facility] to have her licence actually revoked. He reports his mother has stated she will continue to drive. Please call [name] at [phone] / Sherri S.

## 2014-12-23 NOTE — Telephone Encounter (Signed)
I spoke with son he is asking for Dr Tomi Likens to notify the [facility] regarding this patient driving status .

## 2015-01-13 ENCOUNTER — Other Ambulatory Visit: Payer: Self-pay | Admitting: Internal Medicine

## 2015-01-18 ENCOUNTER — Encounter: Payer: Self-pay | Admitting: Internal Medicine

## 2015-01-18 ENCOUNTER — Ambulatory Visit (INDEPENDENT_AMBULATORY_CARE_PROVIDER_SITE_OTHER): Payer: Medicare Other | Admitting: Internal Medicine

## 2015-01-18 ENCOUNTER — Other Ambulatory Visit (INDEPENDENT_AMBULATORY_CARE_PROVIDER_SITE_OTHER): Payer: Medicare Other

## 2015-01-18 VITALS — BP 140/90 | HR 84 | Temp 97.7°F | Resp 16 | Wt 116.0 lb

## 2015-01-18 DIAGNOSIS — F0280 Dementia in other diseases classified elsewhere without behavioral disturbance: Secondary | ICD-10-CM

## 2015-01-18 DIAGNOSIS — I10 Essential (primary) hypertension: Secondary | ICD-10-CM | POA: Diagnosis not present

## 2015-01-18 DIAGNOSIS — R609 Edema, unspecified: Secondary | ICD-10-CM

## 2015-01-18 DIAGNOSIS — E034 Atrophy of thyroid (acquired): Secondary | ICD-10-CM

## 2015-01-18 DIAGNOSIS — G309 Alzheimer's disease, unspecified: Secondary | ICD-10-CM | POA: Diagnosis not present

## 2015-01-18 DIAGNOSIS — E038 Other specified hypothyroidism: Secondary | ICD-10-CM

## 2015-01-18 LAB — HEPATIC FUNCTION PANEL
ALK PHOS: 87 U/L (ref 39–117)
ALT: 14 U/L (ref 0–35)
AST: 15 U/L (ref 0–37)
Albumin: 3.8 g/dL (ref 3.5–5.2)
BILIRUBIN DIRECT: 0.1 mg/dL (ref 0.0–0.3)
Total Bilirubin: 0.7 mg/dL (ref 0.2–1.2)
Total Protein: 6.3 g/dL (ref 6.0–8.3)

## 2015-01-18 LAB — BASIC METABOLIC PANEL
BUN: 19 mg/dL (ref 6–23)
CHLORIDE: 101 meq/L (ref 96–112)
CO2: 34 mEq/L — ABNORMAL HIGH (ref 19–32)
CREATININE: 0.75 mg/dL (ref 0.40–1.20)
Calcium: 9.2 mg/dL (ref 8.4–10.5)
GFR: 79.61 mL/min (ref >60.00)
Glucose, Bld: 97 mg/dL (ref 70–99)
POTASSIUM: 4.2 meq/L (ref 3.5–5.1)
Sodium: 137 mEq/L (ref 135–145)

## 2015-01-18 LAB — TSH: TSH: 1.34 u[IU]/mL (ref 0.35–4.50)

## 2015-01-18 NOTE — Assessment & Plan Note (Signed)
Doing well 

## 2015-01-18 NOTE — Assessment & Plan Note (Signed)
Aricept and Namenda po 

## 2015-01-18 NOTE — Progress Notes (Signed)
Pre visit review using our clinic review tool, if applicable. No additional management support is needed unless otherwise documented below in the visit note. 

## 2015-01-18 NOTE — Assessment & Plan Note (Signed)
Losartan, Furosemide

## 2015-01-18 NOTE — Progress Notes (Signed)
   Subjective:     HPI    F/u nausea - stopped potassium: better. Losartan was stopped in 6/15 The patient presents for a follow-up of  chronic abd pain and poor appetite, hypertension, chronic dyslipidemia, dementia. GI sx's better off Aricept. No foot swelling. Pt was stressed out w/[name]'s absence - better now  She is seeing Dr Tomi Likens.  Wt Readings from Last 3 Encounters:  01/18/15 116 lb (52.617 kg)  12/22/14 114 lb (51.71 kg)  10/14/14 124 lb (56.246 kg)   BP Readings from Last 3 Encounters:  01/18/15 140/90  12/22/14 130/78  10/14/14 110/70      Review of Systems  Constitutional: Negative for chills, activity change, appetite change, fatigue and unexpected weight change.  HENT: Negative for congestion, mouth sores and sinus pressure.   Eyes: Negative for visual disturbance.  Respiratory: Negative for cough, chest tightness and shortness of breath.   Gastrointestinal: Negative for nausea, abdominal pain and abdominal distention.  Genitourinary: Negative for frequency, difficulty urinating and vaginal pain.  Musculoskeletal: Negative for myalgias, back pain and gait problem.  Skin: Negative for pallor and rash.  Neurological: Negative for dizziness, tremors, weakness, numbness and headaches.  Psychiatric/Behavioral: Negative for suicidal ideas, confusion and sleep disturbance. The patient is nervous/anxious.        Objective:   Physical Exam  Constitutional: She appears well-developed. No distress.  HENT:  Head: Normocephalic.  Right Ear: External ear normal.  Left Ear: External ear normal.  Nose: Nose normal.  Mouth/Throat: Oropharynx is clear and moist.  Eyes: Conjunctivae are normal. Pupils are equal, round, and reactive to light. Right eye exhibits no discharge. Left eye exhibits no discharge.  Neck: Normal range of motion. Neck supple. No JVD present. No tracheal deviation present. No thyromegaly present.  Cardiovascular: Normal rate, regular rhythm and  normal heart sounds.   Pulmonary/Chest: No stridor. No respiratory distress. She has no wheezes.  Abdominal: Soft. Bowel sounds are normal. She exhibits no distension and no mass. There is no tenderness. There is no rebound and no guarding.  Musculoskeletal: She exhibits no edema or tenderness.  Lymphadenopathy:    She has no cervical adenopathy.  Neurological: She displays normal reflexes. No cranial nerve deficit. She exhibits normal muscle tone. Coordination normal.  Skin: No rash noted. No erythema.  Psychiatric: She has a normal mood and affect. Her behavior is normal. Judgment and thought content normal.   Mild R car bruit Alert, cooperative  Lab Results  Component Value Date   WBC 10.6* 02/21/2014   HGB 14.0 02/21/2014   HCT 40.7 02/21/2014   PLT 149* 02/21/2014   GLUCOSE 148* 04/07/2014   CHOL 131 12/16/2013   TRIG 69.0 12/16/2013   HDL 46.90 12/16/2013   LDLCALC 70 12/16/2013   ALT 18 02/22/2014   AST 30 02/22/2014   NA 137 04/07/2014   K 3.9 04/07/2014   CL 96 04/07/2014   CREATININE 0.8 04/07/2014   BUN 18 04/07/2014   CO2 33* 04/07/2014   TSH 0.49 08/21/2012   INR 1.15 08/10/2011   HGBA1C 5.8 10/12/2011      Assessment & Plan:

## 2015-01-18 NOTE — Assessment & Plan Note (Signed)
On synthroid

## 2015-02-01 ENCOUNTER — Telehealth: Payer: Self-pay | Admitting: Internal Medicine

## 2015-02-01 NOTE — Telephone Encounter (Signed)
She just wanted to let md know Dr. Tomi Likens has already sent rx...[name]

## 2015-02-01 NOTE — Telephone Encounter (Signed)
My apologies for the confusion. This is in fact simply advisory at the moment

## 2015-02-01 NOTE — Telephone Encounter (Signed)
What is needed? Thx

## 2015-02-01 NOTE — Telephone Encounter (Signed)
What is needed from [place]?  A refill sent to [facility]?

## 2015-02-01 NOTE — Telephone Encounter (Signed)
Patient received a letter from [facility] stating that   Memantine HCl ER (NAMENDA XR) 28 MG CP24 [[id number]]      Is replaced with generic donetezil. Still using [facility]

## 2015-02-10 ENCOUNTER — Telehealth: Payer: Self-pay | Admitting: *Deleted

## 2015-02-10 NOTE — Telephone Encounter (Signed)
Mr. Bornhorst calling stating pt's ins prefers donepezil, donepezil ODT,  Or Memantnie IR in place of Namenda.   Please advise.

## 2015-02-11 MED ORDER — MEMANTINE HCL ER 21 MG PO CP24: ORAL_CAPSULE | ORAL | Status: DC

## 2015-02-11 NOTE — Telephone Encounter (Signed)
Namenda, apparently pt has been getting brand name Namenda.   Ins prefers donepezil, donepezil ODT, Or Memantnie IR. Please advise.

## 2015-02-11 NOTE — Telephone Encounter (Signed)
In place of what? Thx

## 2015-02-11 NOTE — Telephone Encounter (Signed)
Is it Memantine ER? I emailed 21 mg/d Thx

## 2015-02-16 NOTE — Telephone Encounter (Signed)
Pt's spouse informed 

## 2015-03-01 ENCOUNTER — Telehealth: Payer: Self-pay | Admitting: Internal Medicine

## 2015-03-01 NOTE — Telephone Encounter (Signed)
Patient's husband is calling regarding prescription Namenda XR. I don't see it on her med list and he wouldn't tell me what it was in regards to he just wanted to talk with you. Please call him

## 2015-03-01 NOTE — Telephone Encounter (Signed)
[name] states Memantine ER is not available in generic form. He is requesting a new rx for Memantine IR instead. OK? If so, can you send new Rx to [facility]? I couldn't fine the IR.

## 2015-03-02 MED ORDER — MEMANTINE HCL 10 MG PO TABS: 10.0000 mg | ORAL_TABLET | Freq: Two times a day (BID) | ORAL | Status: DC

## 2015-03-02 NOTE — Telephone Encounter (Signed)
Done. Thx.

## 2015-03-02 NOTE — Telephone Encounter (Signed)
Notified pt husband md [facility]...Johny Chess

## 2015-04-27 ENCOUNTER — Ambulatory Visit (INDEPENDENT_AMBULATORY_CARE_PROVIDER_SITE_OTHER): Payer: Medicare Other | Admitting: Internal Medicine

## 2015-04-27 ENCOUNTER — Encounter: Payer: Self-pay | Admitting: Internal Medicine

## 2015-04-27 VITALS — BP 138/80 | HR 85 | Wt 119.0 lb

## 2015-04-27 DIAGNOSIS — G309 Alzheimer's disease, unspecified: Secondary | ICD-10-CM

## 2015-04-27 DIAGNOSIS — E785 Hyperlipidemia, unspecified: Secondary | ICD-10-CM

## 2015-04-27 DIAGNOSIS — F0280 Dementia in other diseases classified elsewhere without behavioral disturbance: Secondary | ICD-10-CM

## 2015-04-27 DIAGNOSIS — I10 Essential (primary) hypertension: Secondary | ICD-10-CM

## 2015-04-27 DIAGNOSIS — I739 Peripheral vascular disease, unspecified: Secondary | ICD-10-CM | POA: Diagnosis not present

## 2015-04-27 NOTE — Progress Notes (Signed)
Pre visit review using our clinic review tool, if applicable. No additional management support is needed unless otherwise documented below in the visit note. 

## 2015-04-27 NOTE — Assessment & Plan Note (Signed)
On Simvastatin, ASA

## 2015-04-27 NOTE — Assessment & Plan Note (Signed)
Aricept and Namenda po  F/u w/Dr Tomi Likens

## 2015-04-27 NOTE — Progress Notes (Signed)
Subjective:  Patient ID: [name], female    DOB: 09-Feb-1936  Age: 79 y.o. MRN: [id number]  CC: No chief complaint on file.   HPI Lachandra Dettmann Smits presents for COPD, dementia, dyslipidemia. Taking long naps in am and pm. No insomnia.  Outpatient Prescriptions Prior to Visit  Medication Sig Dispense Refill  . aspirin 81 MG EC tablet Take 81 mg by mouth daily.     . Cholecalciferol (VITAMIN D3) 1000 UNITS CAPS Take 1 capsule by mouth daily.      . Diclofenac Sodium (PENNSAID) 1.5 % SOLN Place 3-5 drops onto the skin 3 (three) times daily as needed. For pain applies on joints    . donepezil (ARICEPT) 10 MG tablet TAKE 1 TABLET AT BEDTIME 90 tablet 2  . furosemide (LASIX) 20 MG tablet TAKE 2 TABLETS DAILY 180 tablet 2  . loperamide (IMODIUM) 2 MG capsule Take 1 capsule (2 mg total) by mouth every 4 (four) hours as needed for diarrhea or loose stools. 30 capsule 0  . losartan (COZAAR) 50 MG tablet Take 1 tablet (50 mg total) by mouth 2 (two) times daily. 180 tablet 3  . memantine (NAMENDA) 10 MG tablet Take 1 tablet (10 mg total) by mouth 2 (two) times daily. 180 tablet 3  . Multiple Vitamin (MULTIVITAMIN) tablet Take 1 tablet by mouth daily.    Marland Kitchen omeprazole (PRILOSEC) 40 MG capsule TAKE 1 CAPSULE DAILY 90 capsule 3  . ondansetron (ZOFRAN) 4 MG tablet Take 1 tablet (4 mg total) by mouth every 8 (eight) hours as needed. Take  1 tab every 6 hours as needed for nausea and vomiting. 60 tablet 1  . simvastatin (ZOCOR) 40 MG tablet TAKE 1 TABLET AT BEDTIME 90 tablet 2  . SYNTHROID 50 MCG tablet TAKE 1 TABLET DAILY 90 tablet 3  . azithromycin (ZITHROMAX) 250 MG tablet As directed 6 tablet 0  . benzonatate (TESSALON) 200 MG capsule Take 1 capsule (200 mg total) by mouth 2 (two) times daily as needed for cough. (Patient not taking: Reported on 04/27/2015) 60 capsule 0   No facility-administered medications prior to visit.    ROS Review of Systems  Constitutional: Positive for  fatigue. Negative for chills, activity change, appetite change and unexpected weight change.  HENT: Negative for congestion, mouth sores and sinus pressure.   Eyes: Negative for visual disturbance.  Respiratory: Negative for cough and chest tightness.   Gastrointestinal: Negative for nausea and abdominal pain.  Genitourinary: Negative for frequency, difficulty urinating and vaginal pain.  Musculoskeletal: Negative for back pain and gait problem.  Skin: Negative for pallor and rash.  Neurological: Negative for dizziness, tremors, weakness, numbness and headaches.  Psychiatric/Behavioral: Positive for decreased concentration. Negative for confusion and sleep disturbance. The patient is not nervous/anxious.     Objective:  BP 138/80 mmHg  Pulse 85  Wt 119 lb (53.978 kg)  SpO2 92%  BP Readings from Last 3 Encounters:  04/27/15 138/80  01/18/15 140/90  12/22/14 130/78    Wt Readings from Last 3 Encounters:  04/27/15 119 lb (53.978 kg)  01/18/15 116 lb (52.617 kg)  12/22/14 114 lb (51.71 kg)    Physical Exam  Constitutional: She appears well-developed. No distress.  HENT:  Head: Normocephalic.  Right Ear: External ear normal.  Left Ear: External ear normal.  Nose: Nose normal.  Mouth/Throat: Oropharynx is clear and moist.  Eyes: Conjunctivae are normal. Pupils are equal, round, and reactive to light. Right eye exhibits no discharge. Left  eye exhibits no discharge.  Neck: Normal range of motion. Neck supple. No JVD present. No tracheal deviation present. No thyromegaly present.  Cardiovascular: Normal rate, regular rhythm and normal heart sounds.   Pulmonary/Chest: No stridor. No respiratory distress. She has no wheezes.  Abdominal: Soft. Bowel sounds are normal. She exhibits no distension and no mass. There is no tenderness. There is no rebound and no guarding.  Musculoskeletal: She exhibits no edema or tenderness.  Lymphadenopathy:    She has no cervical adenopathy.    Neurological: She displays normal reflexes. No cranial nerve deficit. She exhibits normal muscle tone. Coordination normal.  Skin: No rash noted. No erythema.  Psychiatric: She has a normal mood and affect. Her behavior is normal.    Lab Results  Component Value Date   WBC 10.6* 02/21/2014   HGB 14.0 02/21/2014   HCT 40.7 02/21/2014   PLT 149* 02/21/2014   GLUCOSE 97 01/18/2015   CHOL 131 12/16/2013   TRIG 69.0 12/16/2013   HDL 46.90 12/16/2013   LDLCALC 70 12/16/2013   ALT 14 01/18/2015   AST 15 01/18/2015   NA 137 01/18/2015   K 4.2 01/18/2015   CL 101 01/18/2015   CREATININE 0.75 01/18/2015   BUN 19 01/18/2015   CO2 34* 01/18/2015   TSH 1.34 01/18/2015   INR 1.15 08/10/2011   HGBA1C 5.8 10/12/2011    Ct Abdomen Pelvis W Contrast  02/20/2014   CLINICAL DATA:  Nausea, vomiting and diarrhea.  EXAM: CT ABDOMEN AND PELVIS WITH CONTRAST  TECHNIQUE: Multidetector CT imaging of the abdomen and pelvis was performed using the standard protocol following bolus administration of intravenous contrast.  CONTRAST:  160mL OMNIPAQUE IOHEXOL 300 MG/ML  SOLN  COMPARISON:  11/09/2011.  FINDINGS: Cholecystectomy clips. Interval diffuse low density wall thickening involving multiple small bowel loops, including the terminal ileum. There is also diffuse low density wall thickening involving the mid sigmoid colon, possibly previously present, difficult to assess due to differences in scan parameters. . This is an an area of multiple diverticula. No pericolonic soft tissue stranding.  Minimal free peritoneal fluid. No free peritoneal air. Unremarkable urinary bladder, kidneys, adrenal glands, spleen, pancreas and liver. No enlarged lymph nodes. Clear lung bases. Mild lumbar and lower thoracic spine degenerative changes. Stable L2 bone island.  IMPRESSION: 1. Interval extensive small bowel enteritis. This could be infectious or inflammatory in nature. 2. Interval minimal free peritoneal fluid. 3. Diffuse  low density wall thickening in the mid sigmoid colon in an area of a large number of diverticula. This is most likely due to chronic diverticulosis. Correlation with sigmoidoscopy is recommended to exclude an underlying neoplasm.   Electronically Signed   By: Enrique Sack M.D.   On: 02/20/2014 23:21    Assessment & Plan:   There are no diagnoses linked to this encounter. I have discontinued Ms. Heiny's azithromycin. I am also having her maintain her aspirin, Diclofenac Sodium, Vitamin D3, ondansetron, multivitamin, loperamide, losartan, SYNTHROID, furosemide, simvastatin, benzonatate, donepezil, omeprazole, and memantine.  No orders of the defined types were placed in this encounter.     Follow-up: No Follow-up on file.  [name], MD

## 2015-04-27 NOTE — Assessment & Plan Note (Signed)
Nl BP Losartan

## 2015-05-19 DIAGNOSIS — H2513 Age-related nuclear cataract, bilateral: Secondary | ICD-10-CM | POA: Diagnosis not present

## 2015-05-19 DIAGNOSIS — H524 Presbyopia: Secondary | ICD-10-CM | POA: Diagnosis not present

## 2015-06-04 ENCOUNTER — Other Ambulatory Visit: Payer: Self-pay | Admitting: Internal Medicine

## 2015-06-08 ENCOUNTER — Other Ambulatory Visit: Payer: Self-pay | Admitting: Internal Medicine

## 2015-06-10 DIAGNOSIS — H25012 Cortical age-related cataract, left eye: Secondary | ICD-10-CM | POA: Diagnosis not present

## 2015-06-10 DIAGNOSIS — H25011 Cortical age-related cataract, right eye: Secondary | ICD-10-CM | POA: Diagnosis not present

## 2015-06-10 DIAGNOSIS — H2512 Age-related nuclear cataract, left eye: Secondary | ICD-10-CM | POA: Diagnosis not present

## 2015-06-10 DIAGNOSIS — H2511 Age-related nuclear cataract, right eye: Secondary | ICD-10-CM | POA: Diagnosis not present

## 2015-06-20 ENCOUNTER — Other Ambulatory Visit: Payer: Self-pay | Admitting: Internal Medicine

## 2015-06-21 ENCOUNTER — Other Ambulatory Visit: Payer: Self-pay

## 2015-06-21 MED ORDER — SIMVASTATIN 40 MG PO TABS: 40.0000 mg | ORAL_TABLET | Freq: Every day | ORAL | Status: DC

## 2015-06-21 MED ORDER — FUROSEMIDE 20 MG PO TABS: 40.0000 mg | ORAL_TABLET | Freq: Every day | ORAL | Status: DC

## 2015-06-22 ENCOUNTER — Ambulatory Visit (INDEPENDENT_AMBULATORY_CARE_PROVIDER_SITE_OTHER): Payer: Medicare Other | Admitting: Neurology

## 2015-06-22 ENCOUNTER — Encounter: Payer: Self-pay | Admitting: Neurology

## 2015-06-22 VITALS — BP 122/64 | HR 82 | Temp 98.3°F | Resp 16 | Ht 61.0 in | Wt 124.3 lb

## 2015-06-22 DIAGNOSIS — F0280 Dementia in other diseases classified elsewhere without behavioral disturbance: Secondary | ICD-10-CM | POA: Diagnosis not present

## 2015-06-22 DIAGNOSIS — G309 Alzheimer's disease, unspecified: Secondary | ICD-10-CM

## 2015-06-22 DIAGNOSIS — M545 Low back pain, unspecified: Secondary | ICD-10-CM | POA: Insufficient documentation

## 2015-06-22 DIAGNOSIS — F17200 Nicotine dependence, unspecified, uncomplicated: Secondary | ICD-10-CM

## 2015-06-22 NOTE — Patient Instructions (Signed)
Continue Aricept 10mg  daily and Namenda 10mg  twice daily Refer to physical therapy for left sided sciatica. After you are finished, call in it is not effective. Follow up in 6 months.

## 2015-06-22 NOTE — Progress Notes (Addendum)
NEUROLOGY FOLLOW UP OFFICE NOTE  [name] [id number]  HISTORY OF PRESENT ILLNESS: [name] is a 79 year old right-handed woman with history of GERD, peripheral vascular disease, hyperlipidemia, COPD, hypertension, hypothyroidism, cholecystectomy and anxiety who follows up for Alzheimer's disease.    UPDATE: She is taking Aricept 10mg  and Namenda ER 28mg  daily.  She lives with her husband.  Her husband typically takes care of everything but he recently had a myocardial infarction.  She has not been keeping the house clean and has been hoarding.  Mood has been good.  She forgets people who had just called her on the phone.  She also will forget appointments or repeat questions.  She has a pill box system which helps her take her medication correctly.  She usually sits around the house watching TV and smoking 3 packs of cigarettes a day.  For the past 4 months, she has had a pain radiating from the left lower back to the left buttocks.  Sometimes it radiated down the back of the leg.  HISTORY: Her husband began to notice changes in memory about 4 years ago.  She would have problems with short-term memory.  She would forget about phone calls.  She would repeat questions. Her husband usually goes to the grocery store.  She has no difficulty remembering names or faces.  She has no hallucinations or delusions.  She sleeps well.  She is not depressed.  There has been no change in personality or behavior.  She is able to perform all her ADLs.  She has gotten lost while driving.  During the day, she tends to the house but spends time watching TV.  She reads the Bible but not everyday.  She and her husband are active in the church.  They go to choir practice every Wednesday night.  They participate in a senior group once a month.  Her mother and maternal aunt had dementia.  She has history of nausea and mild anorexia.  No vomiting.  She had been on both Aricept and rivastigmine at  one time.  They were discontinued with concern that it was causing the the nausea.  However, the nausea never resolved.  She has been evaluated by GI with no clear etiology.  She was subsequently restarted on Aricept and then Namenda.  11/05/13 CT HEAD:  no bleed, mass lesions or acute infarcts seen.  Mild atrophy and atherosclerotic changes noted.  PAST MEDICAL HISTORY: Past Medical History  Diagnosis Date  . GERD (gastroesophageal reflux disease)   . Peripheral vascular disease   . Hyperlipidemia   . Osteopenia   . Low back pain     OA R radiculopathy  . Lower extremity edema     chronic  . Personal history of colonic polyps 2001    TUBULAR ADENOMA - [name]  . Diverticulosis of colon (without mention of hemorrhage)   . COPD (chronic obstructive pulmonary disease)   . Hypertension   . Hiatal hernia   . Hypothyroidism   . Anxiety   . Peptic ulcer   . Gallstones   . Pneumonia   . Dementia     MEDICATIONS: Current Outpatient Prescriptions on File Prior to Visit  Medication Sig Dispense Refill  . aspirin 81 MG EC tablet Take 81 mg by mouth daily.     . Cholecalciferol (VITAMIN D3) 1000 UNITS CAPS Take 1 capsule by mouth daily.      . Diclofenac Sodium (PENNSAID) 1.5 % SOLN Place 3-5 drops  onto the skin 3 (three) times daily as needed. For pain applies on joints    . donepezil (ARICEPT) 10 MG tablet TAKE 1 TABLET AT BEDTIME 90 tablet 2  . furosemide (LASIX) 20 MG tablet Take 2 tablets (40 mg total) by mouth daily. 180 tablet 2  . loperamide (IMODIUM) 2 MG capsule Take 1 capsule (2 mg total) by mouth every 4 (four) hours as needed for diarrhea or loose stools. 30 capsule 0  . losartan (COZAAR) 50 MG tablet TAKE 1 TABLET TWICE A DAY 180 tablet 2  . memantine (NAMENDA) 10 MG tablet Take 1 tablet (10 mg total) by mouth 2 (two) times daily. 180 tablet 3  . Multiple Vitamin (MULTIVITAMIN) tablet Take 1 tablet by mouth daily.    Marland Kitchen omeprazole (PRILOSEC) 40 MG capsule TAKE 1 CAPSULE DAILY  90 capsule 3  . ondansetron (ZOFRAN) 4 MG tablet Take 1 tablet (4 mg total) by mouth every 8 (eight) hours as needed. Take  1 tab every 6 hours as needed for nausea and vomiting. 60 tablet 1  . simvastatin (ZOCOR) 40 MG tablet Take 1 tablet (40 mg total) by mouth at bedtime. 90 tablet 2  . SYNTHROID 50 MCG tablet TAKE 1 TABLET DAILY 90 tablet 3   No current facility-administered medications on file prior to visit.    ALLERGIES: Allergies  Allergen Reactions  . Codeine Sulfate Other (See Comments)    Extreme headaches  . Exelon [Rivastigmine Tartrate]     nausea  . Oxycodone-Acetaminophen Other (See Comments)    Extreme headaches    FAMILY HISTORY: Family History  Problem Relation Age of Onset  . Dementia Mother   . Hypertension Father   . Heart disease Father   . Colon cancer Sister   . Heart disease Sister   . Esophageal cancer Neg Hx   . Rectal cancer Neg Hx   . Stomach cancer Neg Hx     SOCIAL HISTORY: Social History   Social History  . Marital Status: Married    Spouse Name: N/A  . Number of Children: 4  . Years of Education: N/A   Occupational History  . retired    Social History Main Topics  . Smoking status: Current Every Day Smoker -- 1.50 packs/day for 61 years    Types: Cigarettes  . Smokeless tobacco: Never Used     Comment: Sheet given on smoking  . Alcohol Use: No  . Drug Use: No  . Sexual Activity: No   Other Topics Concern  . Not on file   Social History Narrative    REVIEW OF SYSTEMS: Constitutional: No fevers, chills, or sweats, no generalized fatigue, change in appetite Eyes: No visual changes, double vision, eye pain Ear, nose and throat: No hearing loss, ear pain, nasal congestion, sore throat Cardiovascular: No chest pain, palpitations Respiratory:  No shortness of breath at rest or with exertion, wheezes GastrointestinaI: No nausea, vomiting, diarrhea, abdominal pain, fecal incontinence Genitourinary:  No dysuria, urinary  retention or frequency Musculoskeletal:  No neck pain, back pain Integumentary: No rash, pruritus, skin lesions Neurological: as above Psychiatric: No depression, insomnia, anxiety Endocrine: No palpitations, fatigue, diaphoresis, mood swings, change in appetite, change in weight, increased thirst Hematologic/Lymphatic:  No anemia, purpura, petechiae. Allergic/Immunologic: no itchy/runny eyes, nasal congestion, recent allergic reactions, rashes  PHYSICAL EXAM: Filed Vitals:   06/22/15 1346  BP: 122/64  Pulse: 82  Temp: 98.3 F (36.8 C)  Resp: 16   General: No acute distress.  Patient  appears well-groomed.   Head:  Normocephalic/atraumatic Eyes:  Fundoscopic exam unremarkable without vessel changes, exudates, hemorrhages or papilledema. Neck: supple, no paraspinal tenderness, full range of motion Heart:  Regular rate and rhythm Lungs:  Clear to auscultation bilaterally Back: No paraspinal tenderness Neurological Exam: alert and oriented to person, place and year (but not date or day of week). Attention span and concentration impaired, recent memory poor, remote memory intact, fund of knowledge intact.  Speech fluent and not dysarthric, language intact.   MMSE - Mini Mental State Exam 06/22/2015 06/23/2014  Orientation to time 2 2  Orientation to Place 3 5  Registration 3 3  Attention/ Calculation 3 5  Recall 0 0  Language- name 2 objects 2 2  Language- repeat 1 1  Language- follow 3 step command 3 3  Language- read & follow direction 1 1  Write a sentence 1 1  Copy design 1 1  Total score 20 24   CN II-XII intact. Fundoscopic exam unremarkable without vessel changes, exudates, hemorrhages or papilledema.  Bulk and tone normal, muscle strength 5/5 throughout.  Sensation to light touch intact.  Deep tendon reflexes 2+ throughout.  Finger to nose  testing intact.  Gait normal, Romberg negative  IMPRESSION: Alzheimer's dementia Left sided back pain with sciatica Tobacco  dependence  PLAN: 1.  Continue Aricept and Namenda 2.  Refer for PT 3.  Smoking cessation 4.  Follow up in 3 months.  30 minutes spent face to face with patient, over 50% spent discussing management.   Metta Clines, DO  CC:  Lew Dawes, MD

## 2015-06-23 ENCOUNTER — Telehealth: Payer: Self-pay | Admitting: Neurology

## 2015-06-23 NOTE — Telephone Encounter (Signed)
VM-Pt said she wanted to talk to the person who called her/Dawn CB# [phone]

## 2015-06-24 ENCOUNTER — Ambulatory Visit: Payer: TRICARE For Life (TFL) | Admitting: Neurology

## 2015-06-29 DIAGNOSIS — H2512 Age-related nuclear cataract, left eye: Secondary | ICD-10-CM | POA: Diagnosis not present

## 2015-07-26 ENCOUNTER — Encounter: Payer: Self-pay | Admitting: Gastroenterology

## 2015-07-28 ENCOUNTER — Ambulatory Visit: Payer: Medicare Other | Admitting: Internal Medicine

## 2015-07-28 DIAGNOSIS — H25011 Cortical age-related cataract, right eye: Secondary | ICD-10-CM | POA: Diagnosis not present

## 2015-07-28 DIAGNOSIS — H2511 Age-related nuclear cataract, right eye: Secondary | ICD-10-CM | POA: Diagnosis not present

## 2015-07-29 ENCOUNTER — Ambulatory Visit: Payer: Medicare Other | Attending: Neurology | Admitting: Rehabilitation

## 2015-07-29 ENCOUNTER — Encounter: Payer: Self-pay | Admitting: Rehabilitation

## 2015-07-29 DIAGNOSIS — M5442 Lumbago with sciatica, left side: Secondary | ICD-10-CM | POA: Diagnosis not present

## 2015-07-29 NOTE — Patient Instructions (Signed)
Piriformis Stretch, Sitting    Sit, left ankle over R knee, same-side hand on crossed knee. Push down on knee, keeping spine straight. Lean trunk forward, with flat back, until tension is felt in hamstrings and gluteals of crossed-leg side. Hold _60__ seconds.  Repeat __2_ times per session. Do __2_ sessions per day.    Bracing With Bridging (Hook-Lying)    With neutral spine, tighten pelvic floor and abdominals and hold. Lift bottom. Repeat _10__ times. Do _2__ times a day.   Copyright  VHI. All rights reserved.   PELVIC TILT: Posterior    Tighten abdominals, flatten low back. __10_ reps per set (hold for 3 secs), _2_ sets per day, _5_ days per week.  [name] may have to put his hand at your back to make sure you are flattening your back into his hand.     Copyright  VHI. All rights reserved.     Lie on your back and keep your arms by your side  (you do not have to raise your arms like in the picture).  Flatten back out (as above) then march your right leg, then left leg, then relax.  Repeat x 10 reps.  2 times a day if you can.

## 2015-07-29 NOTE — Therapy (Signed)
[facility] [address] [place], [place], [address] Phone: [phone]   Fax:  [phone]  Physical Therapy Evaluation  Patient Details  Name: [name] MRN: [id number] Date of Birth: 11/10/1935 Referring Montrel Donahoe: Metta Clines, DO  Encounter Date: 07/29/2015      PT End of Session - 07/29/15 1224    Visit Number 1   Number of Visits 2  may be eval only if HEP going well   Date for PT Re-Evaluation 09/27/15   Authorization Type MCR ([employer] secondary)   PT Start Time 1015   PT Stop Time 1100   PT Time Calculation (min) 45 min   Activity Tolerance Patient tolerated treatment well   Behavior During Therapy [facility] for tasks assessed/performed      Past Medical History  Diagnosis Date  . GERD (gastroesophageal reflux disease)   . Peripheral vascular disease ([place])   . Hyperlipidemia   . Osteopenia   . Low back pain     OA R radiculopathy  . Lower extremity edema     chronic  . Personal history of colonic polyps 2001    TUBULAR ADENOMA - [name]  . Diverticulosis of colon (without mention of hemorrhage)   . COPD (chronic obstructive pulmonary disease) ([place])   . Hypertension   . Hiatal hernia   . Hypothyroidism   . Anxiety   . Peptic ulcer   . Gallstones   . Pneumonia   . Dementia     Past Surgical History  Procedure Laterality Date  . Abdominal hysterectomy    . Appendectomy    . Tubal ligation    . Cholecystectomy    . Carpal tunnel release Bilateral   . Rotator cuff repair Bilateral   . Neck surgery      bone and plate   . Abdominal adhesion surgery      There were no vitals filed for this visit.  Visit Diagnosis:  Left-sided low back pain with left-sided sciatica - Plan: PT plan of care cert/re-cert      Subjective Assessment - 07/29/15 1022    Subjective "The doctor referred me, we didn't know we were coming here."     Patient is accompained by: Family member   Pertinent  History COPD, Alzheimer's   Limitations --  after sleeping   Patient Stated Goals "To decrease pain."   Currently in Pain? No/denies            [facility] PT Assessment - 07/29/15 0001    Assessment   Medical Diagnosis low back pain   Referring Zaydenn Balaguer Metta Clines, DO   Onset Date/Surgical Date --  close to 10 years   Precautions   Precautions Fall   Balance Screen   Has the patient fallen in the past 6 months No   Has the patient had a decrease in activity level because of a fear of falling?  No   Is the patient reluctant to leave their home because of a fear of falling?  No   Home Environment   Living Environment Private residence   Living Arrangements Spouse/significant other   Available Help at Discharge Available 24 hours/day   Type of [place] to enter   Entrance Stairs-Number of Steps 1   Entrance Stairs-Rails None   Home Layout Laundry or work area in basement  rarely goes in basement, husband is with her   Cytogeneticist   Prior Function  Level of Independence Independent   Vocation Retired   Charity fundraiser Status History of cognitive impairments - at baseline   Sensation   Light Touch Appears Intact   Proprioception Appears Intact   Coordination   Gross Motor Movements are Fluid and Coordinated Yes   Fine Motor Movements are Fluid and Coordinated Yes   ROM / Strength   AROM / PROM / Strength Strength   Strength   Overall Strength Within functional limits for tasks performed   Transfers   Transfers Sit to Stand;Stand to Sit   Sit to Stand 7: Independent   Stand to Sit 7: Independent   Ambulation/Gait   Ambulation/Gait Yes   Ambulation/Gait Assistance 7: Independent   Gait velocity 3.22 ft/sec   Stairs Yes   Stairs Assistance 6: Modified independent (Device/Increase time)   Stair Management Technique Two rails;Alternating pattern;Forwards   Number of Stairs 4   Height of Stairs 6   Standardized Balance  Assessment   Standardized Balance Assessment Dynamic Gait Index   Dynamic Gait Index   Level Surface Normal   Change in Gait Speed Normal   Gait with Horizontal Head Turns Mild Impairment   Gait with Vertical Head Turns Mild Impairment   Gait and Pivot Turn Normal   Step Over Obstacle Mild Impairment   Step Around Obstacles Normal   Steps Mild Impairment   Total Score 20                           PT Education - 07/29/15 1223    Education provided Yes   Education Details Provided education on evaluation findings, POC, and initiation of HEP   Person(s) Educated Patient;Spouse   Methods Explanation;Demonstration;Handout   Comprehension Returned demonstration;Verbalized understanding          PT Short Term Goals - 07/29/15 1228    PT SHORT TERM GOAL #1   Title Pt will be independent with HEP with use of handout and cues from husband.  (Target Date: 08/26/15)  Pt partially met on eval, will follow up if needed w/ single appt   Time 2   Period Weeks   Status Partially Met                  Plan - 07/29/15 1224    Clinical Impression Statement Pt presents with long standing (10 yr history) of low back pain that sometimes radiates to LLE.  Pt nor husband note any specific pattern to pain other than it is intermittent and is usually worse in the mornings when she does have pain.  Based on evluation findings, pts gait speed normal for her age and does not present as a fall risk, therefore provided pt and husband with HEP for stretching and core strength to assist with decreasing pain.  Recommended single follow up visit in order to determine if HEP going well and to add if needed.  Discussed that if HEP going well, pt and husband could cancel follow up visit.  Both verbalized understanding, see pt instruction for details on HEP.    Pt will benefit from skilled therapeutic intervention in order to improve on the following deficits Pain   Rehab Potential  Excellent   PT Frequency --  one possible follow up visit only   PT Duration Other (comment)  see above   PT Treatment/Interventions Therapeutic exercise   PT Next Visit Plan Address HEP and add if need be for core  strength   PT Home Exercise Plan see pt instruction   Consulted and Agree with Plan of Care Patient;Family member/caregiver   Family Member Consulted husband         Problem List Patient Active Problem List   Diagnosis Date Noted  . Lumbago 06/22/2015  . Tobacco dependence 06/22/2015  . Alzheimer's dementia 06/23/2014  . Enteritis 02/21/2014  . Gastroenteritis 02/21/2014  . Hypokalemia 09/24/2013  . Actinic keratoses 11/25/2012  . COPD (chronic obstructive pulmonary disease) ([place])   . Hypertension   . Loss of weight 08/22/2011  . Fatigue 08/22/2011  . Benign neoplasm of colon 08/09/2011  . Diverticulosis of colon (without mention of hemorrhage) 08/09/2011  . Family history of malignant neoplasm of gastrointestinal tract 08/08/2011  . Unexplained weight loss 08/08/2011  . Nausea 08/08/2011  . HAND PAIN 05/30/2010  . SMOKER 08/03/2009  . NEOPLASM, SKIN, UNCERTAIN BEHAVIOR 87/19/5974  . Memory loss 01/27/2009  . Edema 06/08/2008  . HIP PAIN, RIGHT 06/04/2008  . BACK PAIN 06/04/2008  . OTHER MALAISE AND FATIGUE 11/18/2007  . IMPAIRED FASTING GLUCOSE 11/18/2007  . CHEST PAIN, UNSPECIFIED 10/22/2007  . Acute bronchitis 10/07/2007  . Dyslipidemia 07/24/2007  . OSTEOPENIA 07/24/2007  . Hypothyroidism 05/08/2007  . CAROTID ARTERY STENOSIS, BILATERAL 05/08/2007  . Peripheral vascular disease ([place]) 05/08/2007  . GERD 05/08/2007  . MIGRAINES, HX OF 05/08/2007    Cameron Sprang, PT, MPT [facility] [address] [place], [place], [address] Phone: [phone]   Fax:  [phone] 07/29/2015, 12:31 PM  Name: [name] MRN: [id number] Date of Birth: May 09, 1936

## 2015-08-03 DIAGNOSIS — H2511 Age-related nuclear cataract, right eye: Secondary | ICD-10-CM | POA: Diagnosis not present

## 2015-08-10 ENCOUNTER — Encounter: Payer: Self-pay | Admitting: Internal Medicine

## 2015-08-10 ENCOUNTER — Other Ambulatory Visit (INDEPENDENT_AMBULATORY_CARE_PROVIDER_SITE_OTHER): Payer: Medicare Other

## 2015-08-10 ENCOUNTER — Ambulatory Visit (INDEPENDENT_AMBULATORY_CARE_PROVIDER_SITE_OTHER): Payer: Medicare Other | Admitting: Internal Medicine

## 2015-08-10 VITALS — BP 154/80 | HR 83 | Wt 124.0 lb

## 2015-08-10 DIAGNOSIS — Z23 Encounter for immunization: Secondary | ICD-10-CM | POA: Diagnosis not present

## 2015-08-10 DIAGNOSIS — I10 Essential (primary) hypertension: Secondary | ICD-10-CM

## 2015-08-10 DIAGNOSIS — G309 Alzheimer's disease, unspecified: Secondary | ICD-10-CM

## 2015-08-10 DIAGNOSIS — R609 Edema, unspecified: Secondary | ICD-10-CM | POA: Diagnosis not present

## 2015-08-10 DIAGNOSIS — F0280 Dementia in other diseases classified elsewhere without behavioral disturbance: Secondary | ICD-10-CM

## 2015-08-10 DIAGNOSIS — Z86010 Personal history of colonic polyps: Secondary | ICD-10-CM | POA: Diagnosis not present

## 2015-08-10 LAB — BASIC METABOLIC PANEL
BUN: 21 mg/dL (ref 6–23)
CALCIUM: 9.8 mg/dL (ref 8.4–10.5)
CO2: 34 mEq/L — ABNORMAL HIGH (ref 19–32)
CREATININE: 0.83 mg/dL (ref 0.40–1.20)
Chloride: 100 mEq/L (ref 96–112)
GFR: 70.72 mL/min (ref >60.00)
GLUCOSE: 89 mg/dL (ref 70–99)
Potassium: 3.9 mEq/L (ref 3.5–5.1)
Sodium: 141 mEq/L (ref 135–145)

## 2015-08-10 NOTE — Assessment & Plan Note (Signed)
Losartan po 

## 2015-08-10 NOTE — Patient Instructions (Signed)
Use Imodium AD as needed for loose stools

## 2015-08-10 NOTE — Progress Notes (Signed)
Subjective:  Patient ID: [name], female    DOB: [birth year]  Age: 79 y.o. MRN: [id number]  CC: No chief complaint on file.   HPI [name] presents for dementia, colon polyps, dyslipidemia f/u. C/o diarrhea x 2 d.  Outpatient Prescriptions Prior to Visit  Medication Sig Dispense Refill  . aspirin 81 MG EC tablet Take 81 mg by mouth daily.     . Cholecalciferol (VITAMIN D3) 1000 UNITS CAPS Take 1 capsule by mouth daily.      . Diclofenac Sodium (PENNSAID) 1.5 % SOLN Place 3-5 drops onto the skin 3 (three) times daily as needed. For pain applies on joints    . donepezil (ARICEPT) 10 MG tablet TAKE 1 TABLET AT BEDTIME 90 tablet 2  . furosemide (LASIX) 20 MG tablet Take 2 tablets (40 mg total) by mouth daily. 180 tablet 2  . loperamide (IMODIUM) 2 MG capsule Take 1 capsule (2 mg total) by mouth every 4 (four) hours as needed for diarrhea or loose stools. 30 capsule 0  . losartan (COZAAR) 50 MG tablet TAKE 1 TABLET TWICE A DAY 180 tablet 2  . memantine (NAMENDA) 10 MG tablet Take 1 tablet (10 mg total) by mouth 2 (two) times daily. 180 tablet 3  . Multiple Vitamin (MULTIVITAMIN) tablet Take 1 tablet by mouth daily.    Marland Kitchen omeprazole (PRILOSEC) 40 MG capsule TAKE 1 CAPSULE DAILY 90 capsule 3  . ondansetron (ZOFRAN) 4 MG tablet Take 1 tablet (4 mg total) by mouth every 8 (eight) hours as needed. Take  1 tab every 6 hours as needed for nausea and vomiting. 60 tablet 1  . simvastatin (ZOCOR) 40 MG tablet Take 1 tablet (40 mg total) by mouth at bedtime. 90 tablet 2  . SYNTHROID 50 MCG tablet TAKE 1 TABLET DAILY 90 tablet 3   No facility-administered medications prior to visit.    ROS Review of Systems  Constitutional: Negative for chills, activity change, appetite change, fatigue and unexpected weight change.  HENT: Negative for congestion, mouth sores and sinus pressure.   Eyes: Negative for visual disturbance.  Respiratory: Negative for cough and chest tightness.    Gastrointestinal: Positive for diarrhea. Negative for nausea and abdominal pain.  Genitourinary: Negative for frequency, difficulty urinating and vaginal pain.  Musculoskeletal: Negative for back pain and gait problem.  Skin: Negative for pallor and rash.  Neurological: Negative for dizziness, tremors, weakness, numbness and headaches.  Psychiatric/Behavioral: Positive for decreased concentration. Negative for confusion and sleep disturbance.    Objective:  BP 154/80 mmHg  Pulse 83  Wt 124 lb (56.246 kg)  SpO2 94%  BP Readings from Last 3 Encounters:  08/10/15 154/80  06/22/15 122/64  04/27/15 138/80    Wt Readings from Last 3 Encounters:  08/10/15 124 lb (56.246 kg)  06/22/15 124 lb 4.8 oz (56.382 kg)  04/27/15 119 lb (53.978 kg)    Physical Exam  Constitutional: She appears well-developed. No distress.  HENT:  Head: Normocephalic.  Right Ear: External ear normal.  Left Ear: External ear normal.  Nose: Nose normal.  Mouth/Throat: Oropharynx is clear and moist.  Eyes: Conjunctivae are normal. Pupils are equal, round, and reactive to light. Right eye exhibits no discharge. Left eye exhibits no discharge.  Neck: Normal range of motion. Neck supple. No JVD present. No tracheal deviation present. No thyromegaly present.  Cardiovascular: Normal rate, regular rhythm and normal heart sounds.   Pulmonary/Chest: No stridor. No respiratory distress. She has no wheezes.  Abdominal: Soft. Bowel sounds are normal. She exhibits no distension and no mass. There is no tenderness. There is no rebound and no guarding.  Musculoskeletal: She exhibits edema. She exhibits no tenderness.  Lymphadenopathy:    She has no cervical adenopathy.  Neurological: She displays normal reflexes. No cranial nerve deficit. She exhibits normal muscle tone. Coordination normal.  Skin: No rash noted. No erythema.  Psychiatric: She has a normal mood and affect. Her behavior is normal. Judgment and thought  content normal.    Lab Results  Component Value Date   WBC 10.6* 02/21/2014   HGB 14.0 02/21/2014   HCT 40.7 02/21/2014   PLT 149* 02/21/2014   GLUCOSE 89 08/10/2015   CHOL 131 12/16/2013   TRIG 69.0 12/16/2013   HDL 46.90 12/16/2013   LDLCALC 70 12/16/2013   ALT 14 01/18/2015   AST 15 01/18/2015   NA 141 08/10/2015   K 3.9 08/10/2015   CL 100 08/10/2015   CREATININE 0.83 08/10/2015   BUN 21 08/10/2015   CO2 34* 08/10/2015   TSH 1.34 01/18/2015   INR 1.15 08/10/2011   HGBA1C 5.8 10/12/2011    Ct Abdomen Pelvis W Contrast  02/20/2014  CLINICAL DATA:  Nausea, vomiting and diarrhea. EXAM: CT ABDOMEN AND PELVIS WITH CONTRAST TECHNIQUE: Multidetector CT imaging of the abdomen and pelvis was performed using the standard protocol following bolus administration of intravenous contrast. CONTRAST:  148mL OMNIPAQUE IOHEXOL 300 MG/ML  SOLN COMPARISON:  11/09/2011. FINDINGS: Cholecystectomy clips. Interval diffuse low density wall thickening involving multiple small bowel loops, including the terminal ileum. There is also diffuse low density wall thickening involving the mid sigmoid colon, possibly previously present, difficult to assess due to differences in scan parameters. . This is an an area of multiple diverticula. No pericolonic soft tissue stranding. Minimal free peritoneal fluid. No free peritoneal air. Unremarkable urinary bladder, kidneys, adrenal glands, spleen, pancreas and liver. No enlarged lymph nodes. Clear lung bases. Mild lumbar and lower thoracic spine degenerative changes. Stable L2 bone island. IMPRESSION: 1. Interval extensive small bowel enteritis. This could be infectious or inflammatory in nature. 2. Interval minimal free peritoneal fluid. 3. Diffuse low density wall thickening in the mid sigmoid colon in an area of a large number of diverticula. This is most likely due to chronic diverticulosis. Correlation with sigmoidoscopy is recommended to exclude an underlying neoplasm.  Electronically Signed   By: Enrique Sack M.D.   On: 02/20/2014 23:21    Assessment & Plan:   Diagnoses and all orders for this visit:  Essential hypertension  History of colonic polyps -     Ambulatory referral to Gastroenterology  Alzheimer's dementia  Edema, unspecified type  Need for influenza vaccination -     Flu Vaccine QUAD 36+ mos IM  I am having Ms. Himes maintain her aspirin, Diclofenac Sodium, Vitamin D3, ondansetron, multivitamin, loperamide, donepezil, omeprazole, memantine, losartan, SYNTHROID, simvastatin, furosemide, ketorolac, ofloxacin, and prednisoLONE acetate.  Meds ordered this encounter  Medications  . ketorolac (ACULAR) 0.5 % ophthalmic solution    Sig: instill 1 drop into right eye four times a day . START 72 hours prior to surgery    Refill:  0  . ofloxacin (OCUFLOX) 0.3 % ophthalmic solution    Sig: instill 1 drop into both eyes four times a day . START 72 hours prior to surgery    Refill:  0  . prednisoLONE acetate (PRED FORTE) 1 % ophthalmic suspension    Sig: instill 1 drop into right  eye four times a day . START AFTER SURGERY    Refill:  0     Follow-up: Return in about 4 months (around 12/08/2015) for a follow-up visit.  [name], MD

## 2015-08-10 NOTE — Assessment & Plan Note (Signed)
Due colon: sch w/Dr Henrene Pastor

## 2015-08-10 NOTE — Assessment & Plan Note (Signed)
Aricept and Namenda po 

## 2015-08-10 NOTE — Assessment & Plan Note (Signed)
Mild chronic on ankles

## 2015-08-10 NOTE — Progress Notes (Signed)
Pre visit review using our clinic review tool, if applicable. No additional management support is needed unless otherwise documented below in the visit note. 

## 2015-08-11 ENCOUNTER — Encounter: Payer: Self-pay | Admitting: Gastroenterology

## 2015-08-11 ENCOUNTER — Encounter: Payer: Self-pay | Admitting: Rehabilitation

## 2015-08-11 NOTE — Therapy (Signed)
[facility] [address] [place], [place], [address] Phone: [phone]   Fax:  [phone]  Patient Details  Name: MYISHA PICKEREL MRN: [id number] Date of Birth: 1935-12-03 Referring Provider:  No ref. provider found  Encounter Date: 08/11/2015  PHYSICAL THERAPY DISCHARGE SUMMARY  Visits from Start of Care: 1 (eval only)  Current functional level related to goals / functional outcomes:     PT Short Term Goals - 07/29/15 1228    PT SHORT TERM GOAL #1   Title Pt will be independent with HEP with use of handout and cues from husband.  (Target Date: 08/26/15)  Pt partially met on eval, will follow up if needed w/ single appt   Time 2   Period Weeks   Status Partially Met        Remaining deficits: Unknown as pt did not return for follow up visit.     Education / Equipment: HEP  Plan: Patient agrees to discharge.  Patient goals were partially met. Patient is being discharged due to meeting the stated rehab goals.  ?????        Cameron Sprang, PT, MPT [facility] [address] [place], [place], [address] Phone: [phone]   Fax:  [phone] 08/11/2015, 9:50 AM

## 2015-08-12 ENCOUNTER — Ambulatory Visit: Payer: Medicare Other | Admitting: Rehabilitation

## 2015-09-15 ENCOUNTER — Ambulatory Visit (AMBULATORY_SURGERY_CENTER): Payer: Self-pay

## 2015-09-15 VITALS — Ht 60.5 in | Wt 127.0 lb

## 2015-09-15 DIAGNOSIS — Z860100 Personal history of colon polyps, unspecified: Secondary | ICD-10-CM

## 2015-09-15 MED ORDER — SUPREP BOWEL PREP KIT 17.5-3.13-1.6 GM/177ML PO SOLN: 1.0000 | Freq: Once | ORAL | Status: DC

## 2015-09-15 NOTE — Progress Notes (Signed)
No allergies to eggs or soy or flu shot No past problems with anesthesia No diet/weight loss meds No home oxygen  No internet

## 2015-09-18 ENCOUNTER — Other Ambulatory Visit: Payer: Self-pay | Admitting: Neurology

## 2015-09-20 NOTE — Telephone Encounter (Signed)
Last OV: 06/22/15 Next OV: 12/21/15

## 2015-09-23 ENCOUNTER — Telehealth: Payer: Self-pay | Admitting: Gastroenterology

## 2015-09-23 NOTE — Telephone Encounter (Signed)
The pt's husband called and notified that the pt has a "deep cough with mucous"  He is calling her PCP to have her evaluated.  I advised to keep the appt as scheduled and call back on Monday to cancel if she is no better.  Pt's husband agreed.

## 2015-09-27 ENCOUNTER — Ambulatory Visit (INDEPENDENT_AMBULATORY_CARE_PROVIDER_SITE_OTHER): Payer: Medicare Other | Admitting: Internal Medicine

## 2015-09-27 ENCOUNTER — Ambulatory Visit (INDEPENDENT_AMBULATORY_CARE_PROVIDER_SITE_OTHER)
Admission: RE | Admit: 2015-09-27 | Discharge: 2015-09-27 | Disposition: A | Discharge status: Home / Self Care | Payer: Medicare Other | Source: Ambulatory Visit | Attending: Internal Medicine | Admitting: Internal Medicine

## 2015-09-27 ENCOUNTER — Encounter: Payer: Self-pay | Admitting: Internal Medicine

## 2015-09-27 VITALS — BP 150/74 | HR 90 | Temp 97.8°F | Wt 124.0 lb

## 2015-09-27 DIAGNOSIS — J209 Acute bronchitis, unspecified: Secondary | ICD-10-CM

## 2015-09-27 DIAGNOSIS — R05 Cough: Secondary | ICD-10-CM | POA: Diagnosis not present

## 2015-09-27 DIAGNOSIS — J449 Chronic obstructive pulmonary disease, unspecified: Secondary | ICD-10-CM

## 2015-09-27 MED ORDER — BENZONATATE 200 MG PO CAPS: 200.0000 mg | ORAL_CAPSULE | Freq: Two times a day (BID) | ORAL | Status: DC | PRN

## 2015-09-27 MED ORDER — METHYLPREDNISOLONE ACETATE 80 MG/ML IJ SUSP
80.0000 mg | Freq: Once | INTRAMUSCULAR | Status: AC
  Administered 2015-09-27: 80 mg via INTRAMUSCULAR

## 2015-09-27 MED ORDER — FLUTICASONE FUROATE-VILANTEROL 100-25 MCG/INH IN AEPB: 1.0000 | INHALATION_SPRAY | Freq: Every day | RESPIRATORY_TRACT | Status: DC

## 2015-09-27 MED ORDER — UMECLIDINIUM-VILANTEROL 62.5-25 MCG/INH IN AEPB: 1.0000 | INHALATION_SPRAY | Freq: Every day | RESPIRATORY_TRACT | Status: DC

## 2015-09-27 MED ORDER — CEFUROXIME AXETIL 250 MG PO TABS: 250.0000 mg | ORAL_TABLET | Freq: Two times a day (BID) | ORAL | Status: DC

## 2015-09-27 NOTE — Progress Notes (Signed)
Pre visit review using our clinic review tool, if applicable. No additional management support is needed unless otherwise documented below in the visit note. 

## 2015-09-27 NOTE — Assessment & Plan Note (Signed)
Exacerbation 12/16 - Depomedrol 80 mg IM

## 2015-09-27 NOTE — Progress Notes (Signed)
Subjective:  Patient ID: [name], female    DOB: Jan 06, 1936  Age: 79 y.o. MRN: [id number]  CC: No chief complaint on file.   HPI Latonia Conrow Streck presents for URI sx's x 5 d - productive cough, wheezing  Outpatient Prescriptions Prior to Visit  Medication Sig Dispense Refill  . aspirin 81 MG EC tablet Take 81 mg by mouth daily.     . Cholecalciferol (VITAMIN D3) 1000 UNITS CAPS Take 1 capsule by mouth daily.      . Diclofenac Sodium (PENNSAID) 1.5 % SOLN Place 3-5 drops onto the skin 3 (three) times daily as needed. For pain applies on joints    . donepezil (ARICEPT) 10 MG tablet TAKE 1 TABLET AT BEDTIME 90 tablet 1  . furosemide (LASIX) 20 MG tablet Take 2 tablets (40 mg total) by mouth daily. 180 tablet 2  . losartan (COZAAR) 50 MG tablet TAKE 1 TABLET TWICE A DAY 180 tablet 2  . memantine (NAMENDA) 10 MG tablet Take 1 tablet (10 mg total) by mouth 2 (two) times daily. 180 tablet 3  . Multiple Vitamin (MULTIVITAMIN) tablet Take 1 tablet by mouth daily.    Marland Kitchen omeprazole (PRILOSEC) 40 MG capsule TAKE 1 CAPSULE DAILY 90 capsule 3  . ondansetron (ZOFRAN) 4 MG tablet Take 1 tablet (4 mg total) by mouth every 8 (eight) hours as needed. Take  1 tab every 6 hours as needed for nausea and vomiting. 60 tablet 1  . simvastatin (ZOCOR) 40 MG tablet Take 1 tablet (40 mg total) by mouth at bedtime. 90 tablet 2  . SUPREP BOWEL PREP SOLN Take 1 kit by mouth once. 354 mL 0  . SYNTHROID 50 MCG tablet TAKE 1 TABLET DAILY 90 tablet 3   No facility-administered medications prior to visit.    ROS Review of Systems  Constitutional: Positive for fatigue. Negative for chills, activity change, appetite change and unexpected weight change.  HENT: Negative for congestion, mouth sores and sinus pressure.   Eyes: Negative for visual disturbance.  Respiratory: Positive for chest tightness, shortness of breath and wheezing. Negative for cough.   Gastrointestinal: Negative for nausea and  abdominal pain.  Genitourinary: Negative for frequency, difficulty urinating and vaginal pain.  Musculoskeletal: Negative for back pain, joint swelling and gait problem.  Skin: Negative for pallor and rash.  Neurological: Negative for dizziness, tremors, weakness, numbness and headaches.  Psychiatric/Behavioral: Negative for confusion and sleep disturbance. The patient is not nervous/anxious.     Objective:  BP 150/74 mmHg  Pulse 90  Temp(Src) 97.8 F (36.6 C) (Oral)  Wt 124 lb (56.246 kg)  SpO2 94%  BP Readings from Last 3 Encounters:  09/27/15 150/74  08/10/15 154/80  06/22/15 122/64    Wt Readings from Last 3 Encounters:  09/27/15 124 lb (56.246 kg)  09/15/15 127 lb (57.607 kg)  08/10/15 124 lb (56.246 kg)    Physical Exam  Constitutional: She appears well-developed. No distress.  HENT:  Head: Normocephalic.  Right Ear: External ear normal.  Left Ear: External ear normal.  Nose: Nose normal.  Mouth/Throat: Oropharynx is clear and moist.  Eyes: Conjunctivae are normal. Pupils are equal, round, and reactive to light. Right eye exhibits no discharge. Left eye exhibits no discharge.  Neck: Normal range of motion. Neck supple. No JVD present. No tracheal deviation present. No thyromegaly present.  Cardiovascular: Normal rate, regular rhythm and normal heart sounds.   Pulmonary/Chest: No stridor. No respiratory distress. She has no wheezes.  Abdominal:  Soft. Bowel sounds are normal. She exhibits no distension and no mass. There is no tenderness. There is no rebound and no guarding.  Musculoskeletal: She exhibits no edema or tenderness.  Lymphadenopathy:    She has no cervical adenopathy.  Neurological: She displays normal reflexes. No cranial nerve deficit. She exhibits normal muscle tone. Coordination normal.  Skin: No rash noted. No erythema.  Psychiatric: She has a normal mood and affect. Her behavior is normal. Judgment and thought content normal.  mild rhonchi eryth  throat  I personally provided Anoro inhaler use teaching. After the teaching patient was able to demonstrate it's use effectively. All questions were answered   Lab Results  Component Value Date   WBC 10.6* 02/21/2014   HGB 14.0 02/21/2014   HCT 40.7 02/21/2014   PLT 149* 02/21/2014   GLUCOSE 89 08/10/2015   CHOL 131 12/16/2013   TRIG 69.0 12/16/2013   HDL 46.90 12/16/2013   LDLCALC 70 12/16/2013   ALT 14 01/18/2015   AST 15 01/18/2015   NA 141 08/10/2015   K 3.9 08/10/2015   CL 100 08/10/2015   CREATININE 0.83 08/10/2015   BUN 21 08/10/2015   CO2 34* 08/10/2015   TSH 1.34 01/18/2015   INR 1.15 08/10/2011   HGBA1C 5.8 10/12/2011    Ct Abdomen Pelvis W Contrast  02/20/2014  CLINICAL DATA:  Nausea, vomiting and diarrhea. EXAM: CT ABDOMEN AND PELVIS WITH CONTRAST TECHNIQUE: Multidetector CT imaging of the abdomen and pelvis was performed using the standard protocol following bolus administration of intravenous contrast. CONTRAST:  150m OMNIPAQUE IOHEXOL 300 MG/ML  SOLN COMPARISON:  11/09/2011. FINDINGS: Cholecystectomy clips. Interval diffuse low density wall thickening involving multiple small bowel loops, including the terminal ileum. There is also diffuse low density wall thickening involving the mid sigmoid colon, possibly previously present, difficult to assess due to differences in scan parameters. . This is an an area of multiple diverticula. No pericolonic soft tissue stranding. Minimal free peritoneal fluid. No free peritoneal air. Unremarkable urinary bladder, kidneys, adrenal glands, spleen, pancreas and liver. No enlarged lymph nodes. Clear lung bases. Mild lumbar and lower thoracic spine degenerative changes. Stable L2 bone island. IMPRESSION: 1. Interval extensive small bowel enteritis. This could be infectious or inflammatory in nature. 2. Interval minimal free peritoneal fluid. 3. Diffuse low density wall thickening in the mid sigmoid colon in an area of a large number of  diverticula. This is most likely due to chronic diverticulosis. Correlation with sigmoidoscopy is recommended to exclude an underlying neoplasm. Electronically Signed   By: SEnrique SackM.D.   On: 02/20/2014 23:21    Assessment & Plan:   There are no diagnoses linked to this encounter. I am having Ms. Helming maintain her aspirin, Diclofenac Sodium, Vitamin D3, ondansetron, multivitamin, omeprazole, memantine, losartan, SYNTHROID, simvastatin, furosemide, SUPREP BOWEL PREP, and donepezil.  No orders of the defined types were placed in this encounter.     Follow-up: No Follow-up on file.  AWalker Kehr MD

## 2015-09-27 NOTE — Patient Instructions (Signed)
Move colonoscopy to mid-January 2017 please

## 2015-09-27 NOTE — Assessment & Plan Note (Addendum)
Ceftin 250 bid x 10 d Tessalon Move colonoscopy to mid-January 2017 please

## 2015-09-28 ENCOUNTER — Encounter: Payer: Medicare Other | Admitting: Gastroenterology

## 2015-11-24 ENCOUNTER — Ambulatory Visit (AMBULATORY_SURGERY_CENTER): Payer: Medicare Other | Admitting: Gastroenterology

## 2015-11-24 ENCOUNTER — Encounter: Payer: Self-pay | Admitting: Gastroenterology

## 2015-11-24 VITALS — BP 128/99 | HR 74 | Temp 97.8°F | Resp 24

## 2015-11-24 DIAGNOSIS — I10 Essential (primary) hypertension: Secondary | ICD-10-CM | POA: Diagnosis not present

## 2015-11-24 DIAGNOSIS — D122 Benign neoplasm of ascending colon: Secondary | ICD-10-CM | POA: Diagnosis not present

## 2015-11-24 DIAGNOSIS — Z86010 Personal history of colonic polyps: Secondary | ICD-10-CM | POA: Diagnosis not present

## 2015-11-24 DIAGNOSIS — J449 Chronic obstructive pulmonary disease, unspecified: Secondary | ICD-10-CM | POA: Diagnosis not present

## 2015-11-24 DIAGNOSIS — E039 Hypothyroidism, unspecified: Secondary | ICD-10-CM | POA: Diagnosis not present

## 2015-11-24 DIAGNOSIS — I739 Peripheral vascular disease, unspecified: Secondary | ICD-10-CM | POA: Diagnosis not present

## 2015-11-24 DIAGNOSIS — K219 Gastro-esophageal reflux disease without esophagitis: Secondary | ICD-10-CM | POA: Diagnosis not present

## 2015-11-24 DIAGNOSIS — G309 Alzheimer's disease, unspecified: Secondary | ICD-10-CM | POA: Diagnosis not present

## 2015-11-24 DIAGNOSIS — E785 Hyperlipidemia, unspecified: Secondary | ICD-10-CM | POA: Diagnosis not present

## 2015-11-24 MED ORDER — SODIUM CHLORIDE 0.9 % IV SOLN: 500.0000 mL | INTRAVENOUS | Status: DC

## 2015-11-24 NOTE — Progress Notes (Signed)
Called to room to assist during endoscopic procedure.  Patient ID and intended procedure confirmed with present staff. Received instructions for my participation in the procedure from the performing physician.  

## 2015-11-24 NOTE — Patient Instructions (Signed)
YOU HAD AN ENDOSCOPIC PROCEDURE TODAY AT [facility]:   Refer to the procedure report that was given to you for any specific questions about what was found during the examination.  If the procedure report does not answer your questions, please call your gastroenterologist to clarify.  If you requested that your care partner not be given the details of your procedure findings, then the procedure report has been included in a sealed envelope for you to review at your convenience later.  YOU SHOULD EXPECT: Some feelings of bloating in the abdomen. Passage of more gas than usual.  Walking can help get rid of the air that was put into your GI tract during the procedure and reduce the bloating. If you had a lower endoscopy (such as a colonoscopy or flexible sigmoidoscopy) you may notice spotting of blood in your stool or on the toilet paper. If you underwent a bowel prep for your procedure, you may not have a normal bowel movement for a few days.  Please Note:  You might notice some irritation and congestion in your nose or some drainage.  This is from the oxygen used during your procedure.  There is no need for concern and it should clear up in a day or so.  SYMPTOMS TO REPORT IMMEDIATELY:   Following lower endoscopy (colonoscopy or flexible sigmoidoscopy):  Excessive amounts of blood in the stool  Significant tenderness or worsening of abdominal pains  Swelling of the abdomen that is new, acute  Fever of 100F or higher   For urgent or emergent issues, a gastroenterologist can be reached at any hour by calling [phone].   DIET: Your first meal following the procedure should be a small meal and then it is ok to progress to your normal diet. Heavy or fried foods are harder to digest and may make you feel nauseous or bloated.  Likewise, meals heavy in dairy and vegetables can increase bloating.  Drink plenty of fluids but you should avoid alcoholic beverages for 24  hours.  ACTIVITY:  You should plan to take it easy for the rest of today and you should NOT DRIVE or use heavy machinery until tomorrow (because of the sedation medicines used during the test).    FOLLOW UP: Our staff will call the number listed on your records the next business day following your procedure to check on you and address any questions or concerns that you may have regarding the information given to you following your procedure. If we do not reach you, we will leave a message.  However, if you are feeling well and you are not experiencing any problems, there is no need to return our call.  We will assume that you have returned to your regular daily activities without incident.  If any biopsies were taken you will be contacted by phone or by letter within the next 1-3 weeks.  Please call us at [phone] if you have not heard about the biopsies in 3 weeks.    SIGNATURES/CONFIDENTIALITY: You and/or your care partner have signed paperwork which will be entered into your electronic medical record.  These signatures attest to the fact that that the information above on your After Visit Summary has been reviewed and is understood.  Full responsibility of the confidentiality of this discharge information lies with you and/or your care-partner.  Polyp/Diverticulosis handout given

## 2015-11-24 NOTE — Progress Notes (Signed)
Report to PACU, RN, vss, BBS= Clear.  

## 2015-11-24 NOTE — Op Note (Signed)
[facility]  [address] [place], [address]   COLONOSCOPY PROCEDURE REPORT  PATIENT: [name], [name]  MR#: [id number] BIRTHDATE: 1936/09/10 , 79  yrs. old GENDER: female ENDOSCOPIST: Milus Banister, MD PROCEDURE DATE:  11/24/2015 PROCEDURE:   Colonoscopy, surveillance and Colonoscopy with snare polypectomy First Screening Colonoscopy - Avg.  risk and is 50 yrs.  old or older - No.  Prior Negative Screening - Now for repeat screening. N/A  History of Adenoma - Now for follow-up colonoscopy & has been > or = to 3 yrs.  Yes hx of adenoma.  Has been 3 or more years since last colonoscopy.  Polyps removed today? Yes ASA CLASS:   Class III INDICATIONS:Surveillance due to prior colonic neoplasia. MEDICATIONS: Monitored anesthesia care and Propofol 120 mg IV  DESCRIPTION OF PROCEDURE:   After the risks benefits and alternatives of the procedure were thoroughly explained, informed consent was obtained.  The digital rectal exam revealed no abnormalities of the rectum.   The LB [id number]  endoscope was introduced through the anus and advanced to the cecum, which was identified by both the appendix and ileocecal valve. No adverse events experienced.   The quality of the prep was good.  The instrument was then slowly withdrawn as the colon was fully examined. Estimated blood loss is zero unless otherwise noted in this procedure report.   COLON FINDINGS: A sessile polyp measuring 3 mm in size was found in the ascending colon.  A polypectomy was performed with a cold snare.  The resection was complete, the polyp tissue was completely retrieved and sent to histology.   There was diverticulosis noted in the left colon.   Small external and internal hemorrhoids were found.   The examination was otherwise normal.  Retroflexed views revealed no abnormalities. The time to cecum = 1.3 Withdrawal time = 5.1   The scope was withdrawn and the procedure  completed. COMPLICATIONS: There were no immediate complications.  ENDOSCOPIC IMPRESSION: 1.   Sessile polyp was found in the ascending colon; polypectomy was performed with a cold snare 2.   Diverticulosis was noted in the left colon 3.   Small external and internal hemorrhoids 4.   The examination was otherwise normal  RECOMMENDATIONS: Await final pathology.  You will likely not need another colonoscopy for colon cancer screening or polyp surveillance since these types of tests usually stop around the age 55-80.  eSigned:  Milus Banister, MD 11/24/2015 11:07 AM

## 2015-11-25 ENCOUNTER — Telehealth: Payer: Self-pay | Admitting: Emergency Medicine

## 2015-11-25 NOTE — Telephone Encounter (Signed)
  Follow up Call-  Call back number 11/24/2015 10/29/2013  Post procedure Call Back phone  # [phone] NUMBER [phone]  Permission to leave phone message Yes Yes     Patient questions:  Do you have a fever, pain , or abdominal swelling? No. Pain Score  0 *  Have you tolerated food without any problems? Yes.    Have you been able to return to your normal activities? Yes.    Do you have any questions about your discharge instructions: Diet   No. Medications  No. Follow up visit  No.  Do you have questions or concerns about your Care? No.  Actions: * If pain score is 4 or above: No action needed, pain <4.

## 2015-12-03 ENCOUNTER — Encounter: Payer: Self-pay | Admitting: Gastroenterology

## 2015-12-08 ENCOUNTER — Other Ambulatory Visit (INDEPENDENT_AMBULATORY_CARE_PROVIDER_SITE_OTHER): Payer: Medicare Other

## 2015-12-08 ENCOUNTER — Encounter: Payer: Self-pay | Admitting: Internal Medicine

## 2015-12-08 ENCOUNTER — Ambulatory Visit (INDEPENDENT_AMBULATORY_CARE_PROVIDER_SITE_OTHER): Payer: Medicare Other | Admitting: Internal Medicine

## 2015-12-08 VITALS — BP 118/74 | HR 91 | Wt 122.0 lb

## 2015-12-08 DIAGNOSIS — E034 Atrophy of thyroid (acquired): Secondary | ICD-10-CM

## 2015-12-08 DIAGNOSIS — R634 Abnormal weight loss: Secondary | ICD-10-CM

## 2015-12-08 DIAGNOSIS — I10 Essential (primary) hypertension: Secondary | ICD-10-CM | POA: Diagnosis not present

## 2015-12-08 DIAGNOSIS — R413 Other amnesia: Secondary | ICD-10-CM

## 2015-12-08 DIAGNOSIS — E038 Other specified hypothyroidism: Secondary | ICD-10-CM | POA: Diagnosis not present

## 2015-12-08 DIAGNOSIS — J449 Chronic obstructive pulmonary disease, unspecified: Secondary | ICD-10-CM

## 2015-12-08 LAB — BASIC METABOLIC PANEL
BUN: 22 mg/dL (ref 6–23)
CO2: 32 mEq/L (ref 19–32)
CREATININE: 0.75 mg/dL (ref 0.40–1.20)
Calcium: 9.8 mg/dL (ref 8.4–10.5)
Chloride: 101 mEq/L (ref 96–112)
GFR: 79.43 mL/min (ref >60.00)
GLUCOSE: 95 mg/dL (ref 70–99)
POTASSIUM: 3.9 meq/L (ref 3.5–5.1)
Sodium: 140 mEq/L (ref 135–145)

## 2015-12-08 LAB — CBC WITH DIFFERENTIAL/PLATELET
BASOS PCT: 0.8 % (ref 0.0–3.0)
Basophils Absolute: 0.1 10*3/uL (ref 0.0–0.1)
EOS ABS: 0.1 10*3/uL (ref 0.0–0.7)
EOS PCT: 0.9 % (ref 0.0–5.0)
HCT: 47 % — ABNORMAL HIGH (ref 36.0–46.0)
Hemoglobin: 15.8 g/dL — ABNORMAL HIGH (ref 12.0–15.0)
LYMPHS ABS: 3.7 10*3/uL (ref 0.7–4.0)
Lymphocytes Relative: 32.9 % (ref 12.0–46.0)
MCHC: 33.7 g/dL (ref 30.0–36.0)
MCV: 88.7 fl (ref 78.0–100.0)
MONO ABS: 0.9 10*3/uL (ref 0.1–1.0)
Monocytes Relative: 8.1 % (ref 3.0–12.0)
NEUTROS PCT: 57.3 % (ref 43.0–77.0)
Neutro Abs: 6.4 10*3/uL (ref 1.4–7.7)
Platelets: 174 10*3/uL (ref 150.0–400.0)
RBC: 5.3 Mil/uL — AB (ref 3.87–5.11)
RDW: 13.1 % (ref 11.5–15.5)
WBC: 11.2 10*3/uL — ABNORMAL HIGH (ref 4.0–10.5)

## 2015-12-08 LAB — TSH: TSH: 1.03 u[IU]/mL (ref 0.35–4.50)

## 2015-12-08 NOTE — Assessment & Plan Note (Signed)
Mild  Losartan

## 2015-12-08 NOTE — Assessment & Plan Note (Signed)
On Levothroid Labs 

## 2015-12-08 NOTE — Assessment & Plan Note (Signed)
Wt Readings from Last 3 Encounters:  12/08/15 122 lb (55.339 kg)  09/27/15 124 lb (56.246 kg)  09/15/15 127 lb (57.607 kg)

## 2015-12-08 NOTE — Progress Notes (Signed)
Subjective:  Patient ID: [name], female    DOB: 1936/08/13  Age: 80 y.o. MRN: [id number]  CC: No chief complaint on file.   HPI [name] presents for dementia, wt loss, COPD,hypothyroidism. C/o diarrhea since colonoscopy.  Outpatient Prescriptions Prior to Visit  Medication Sig Dispense Refill  . aspirin 81 MG EC tablet Take 81 mg by mouth daily.     . benzonatate (TESSALON) 200 MG capsule Take 1 capsule (200 mg total) by mouth 2 (two) times daily as needed for cough. 60 capsule 0  . cefUROXime (CEFTIN) 250 MG tablet Take 1 tablet (250 mg total) by mouth 2 (two) times daily. 20 tablet 0  . Cholecalciferol (VITAMIN D3) 1000 UNITS CAPS Take 1 capsule by mouth daily.      . Diclofenac Sodium (PENNSAID) 1.5 % SOLN Place 3-5 drops onto the skin 3 (three) times daily as needed. For pain applies on joints    . donepezil (ARICEPT) 10 MG tablet TAKE 1 TABLET AT BEDTIME 90 tablet 1  . Fluticasone Furoate-Vilanterol (BREO ELLIPTA) 100-25 MCG/INH AEPB Inhale 1 Act into the lungs daily. 1 each 5  . furosemide (LASIX) 20 MG tablet Take 2 tablets (40 mg total) by mouth daily. 180 tablet 2  . losartan (COZAAR) 50 MG tablet TAKE 1 TABLET TWICE A DAY 180 tablet 2  . memantine (NAMENDA) 10 MG tablet Take 1 tablet (10 mg total) by mouth 2 (two) times daily. 180 tablet 3  . Multiple Vitamin (MULTIVITAMIN) tablet Take 1 tablet by mouth daily.    Marland Kitchen omeprazole (PRILOSEC) 40 MG capsule TAKE 1 CAPSULE DAILY 90 capsule 3  . ondansetron (ZOFRAN) 4 MG tablet Take 1 tablet (4 mg total) by mouth every 8 (eight) hours as needed. Take  1 tab every 6 hours as needed for nausea and vomiting. 60 tablet 1  . simvastatin (ZOCOR) 40 MG tablet Take 1 tablet (40 mg total) by mouth at bedtime. 90 tablet 2  . SYNTHROID 50 MCG tablet TAKE 1 TABLET DAILY 90 tablet 3   No facility-administered medications prior to visit.    ROS Review of Systems  Constitutional: Positive for unexpected weight  change. Negative for chills, activity change, appetite change and fatigue.  HENT: Negative for congestion, mouth sores and sinus pressure.   Eyes: Negative for visual disturbance.  Respiratory: Negative for cough and chest tightness.   Gastrointestinal: Positive for diarrhea. Negative for nausea, vomiting, abdominal pain and abdominal distention.  Genitourinary: Negative for frequency, difficulty urinating and vaginal pain.  Musculoskeletal: Positive for back pain. Negative for gait problem and neck pain.  Skin: Negative for pallor and rash.  Neurological: Negative for dizziness, tremors, weakness, numbness and headaches.  Psychiatric/Behavioral: Positive for sleep disturbance and decreased concentration. Negative for confusion.    Objective:  BP 118/74 mmHg  Pulse 91  Wt 122 lb (55.339 kg)  SpO2 92%  BP Readings from Last 3 Encounters:  12/08/15 118/74  11/24/15 128/99  09/27/15 150/74    Wt Readings from Last 3 Encounters:  12/08/15 122 lb (55.339 kg)  09/27/15 124 lb (56.246 kg)  09/15/15 127 lb (57.607 kg)    Physical Exam  Constitutional: She appears well-developed. No distress.  HENT:  Head: Normocephalic.  Right Ear: External ear normal.  Left Ear: External ear normal.  Nose: Nose normal.  Mouth/Throat: Oropharynx is clear and moist.  Eyes: Conjunctivae are normal. Pupils are equal, round, and reactive to light. Right eye exhibits no discharge. Left eye exhibits  no discharge.  Neck: Normal range of motion. Neck supple. No JVD present. No tracheal deviation present. No thyromegaly present.  Cardiovascular: Normal rate, regular rhythm and normal heart sounds.   Pulmonary/Chest: No stridor. No respiratory distress. She has no wheezes.  Abdominal: Soft. Bowel sounds are normal. She exhibits no distension and no mass. There is no tenderness. There is no rebound and no guarding.  Musculoskeletal: She exhibits tenderness. She exhibits no edema.  Lymphadenopathy:    She  has no cervical adenopathy.  Neurological: She displays normal reflexes. No cranial nerve deficit. She exhibits normal muscle tone. Coordination normal.  Skin: No rash noted. No erythema.  Psychiatric: She has a normal mood and affect. Her behavior is normal.   LS tender Thin  Lab Results  Component Value Date   WBC 10.6* 02/21/2014   HGB 14.0 02/21/2014   HCT 40.7 02/21/2014   PLT 149* 02/21/2014   GLUCOSE 89 08/10/2015   CHOL 131 12/16/2013   TRIG 69.0 12/16/2013   HDL 46.90 12/16/2013   LDLCALC 70 12/16/2013   ALT 14 01/18/2015   AST 15 01/18/2015   NA 141 08/10/2015   K 3.9 08/10/2015   CL 100 08/10/2015   CREATININE 0.83 08/10/2015   BUN 21 08/10/2015   CO2 34* 08/10/2015   TSH 1.34 01/18/2015   INR 1.15 08/10/2011   HGBA1C 5.8 10/12/2011    Dg Chest 2 View  09/27/2015  CLINICAL DATA:  Productive cough, wheezing EXAM: CHEST  2 VIEW COMPARISON:  10/28/2013 FINDINGS: Cardiomediastinal silhouette is stable. Hyperinflation and chronic mild interstitial prominence again noted. No acute infiltrate or pulmonary edema. Mild degenerative changes thoracic spine. IMPRESSION: No active cardiopulmonary disease. Stable hyperinflation and chronic mild interstitial prominence. Electronically Signed   By: Lahoma Crocker M.D.   On: 09/27/2015 09:26    Assessment & Plan:   Diagnoses and all orders for this visit:  Essential hypertension -     Basic metabolic panel; Future -     CBC with Differential/Platelet; Future -     TSH; Future  Chronic obstructive pulmonary disease, unspecified COPD type ([place]) -     Basic metabolic panel; Future -     CBC with Differential/Platelet; Future -     TSH; Future  Loss of weight -     Basic metabolic panel; Future -     CBC with Differential/Platelet; Future -     TSH; Future  Hypothyroidism due to acquired atrophy of thyroid  Memory loss   I am having Ms. Vorce maintain her aspirin, Diclofenac Sodium, Vitamin D3, ondansetron,  multivitamin, omeprazole, memantine, losartan, SYNTHROID, simvastatin, furosemide, donepezil, cefUROXime, benzonatate, and fluticasone furoate-vilanterol.  No orders of the defined types were placed in this encounter.     Follow-up: Return in about 3 months (around 03/09/2016) for a follow-up visit.  [name], MD

## 2015-12-08 NOTE — Assessment & Plan Note (Signed)
On Aricept Labs

## 2015-12-08 NOTE — Assessment & Plan Note (Addendum)
Pt continues to smoke 1/-1 ppd

## 2015-12-08 NOTE — Progress Notes (Signed)
Pre visit review using our clinic review tool, if applicable. No additional management support is needed unless otherwise documented below in the visit note. 

## 2015-12-21 ENCOUNTER — Ambulatory Visit: Payer: Medicare Other | Admitting: Neurology

## 2016-01-06 ENCOUNTER — Other Ambulatory Visit: Payer: Self-pay | Admitting: Internal Medicine

## 2016-02-07 ENCOUNTER — Other Ambulatory Visit: Payer: Self-pay | Admitting: Internal Medicine

## 2016-02-27 ENCOUNTER — Other Ambulatory Visit: Payer: Self-pay | Admitting: Internal Medicine

## 2016-03-13 ENCOUNTER — Ambulatory Visit (INDEPENDENT_AMBULATORY_CARE_PROVIDER_SITE_OTHER): Payer: Medicare Other | Admitting: Internal Medicine

## 2016-03-13 ENCOUNTER — Encounter: Payer: Self-pay | Admitting: Internal Medicine

## 2016-03-13 VITALS — BP 120/86 | HR 83 | Wt 127.0 lb

## 2016-03-13 DIAGNOSIS — R413 Other amnesia: Secondary | ICD-10-CM | POA: Diagnosis not present

## 2016-03-13 DIAGNOSIS — R634 Abnormal weight loss: Secondary | ICD-10-CM | POA: Diagnosis not present

## 2016-03-13 DIAGNOSIS — I739 Peripheral vascular disease, unspecified: Secondary | ICD-10-CM | POA: Diagnosis not present

## 2016-03-13 DIAGNOSIS — J449 Chronic obstructive pulmonary disease, unspecified: Secondary | ICD-10-CM | POA: Diagnosis not present

## 2016-03-13 MED ORDER — UMECLIDINIUM BROMIDE 62.5 MCG/INH IN AEPB: 1.0000 | INHALATION_SPRAY | Freq: Every day | RESPIRATORY_TRACT | Status: DC

## 2016-03-13 NOTE — Assessment & Plan Note (Signed)
On Simvastatin, ASA

## 2016-03-13 NOTE — Assessment & Plan Note (Signed)
On Aricept 

## 2016-03-13 NOTE — Progress Notes (Signed)
Subjective:  Patient ID: [name], female    DOB: Nov 14, 1935  Age: 80 y.o. MRN: [id number]  CC: No chief complaint on file.   HPI [name] presents for a cough, memory loss, edema f/u  Outpatient Prescriptions Prior to Visit  Medication Sig Dispense Refill  . aspirin 81 MG EC tablet Take 81 mg by mouth daily.     . benzonatate (TESSALON) 200 MG capsule Take 1 capsule (200 mg total) by mouth 2 (two) times daily as needed for cough. 60 capsule 0  . Cholecalciferol (VITAMIN D3) 1000 UNITS CAPS Take 1 capsule by mouth daily.      . Diclofenac Sodium (PENNSAID) 1.5 % SOLN Place 3-5 drops onto the skin 3 (three) times daily as needed. For pain applies on joints    . donepezil (ARICEPT) 10 MG tablet TAKE 1 TABLET AT BEDTIME 90 tablet 1  . furosemide (LASIX) 20 MG tablet Take 2 tablets (40 mg total) by mouth daily. 180 tablet 2  . losartan (COZAAR) 50 MG tablet TAKE 1 TABLET TWICE A DAY 180 tablet 2  . memantine (NAMENDA) 10 MG tablet TAKE 1 TABLET TWICE A DAY 180 tablet 2  . Multiple Vitamin (MULTIVITAMIN) tablet Take 1 tablet by mouth daily.    Marland Kitchen omeprazole (PRILOSEC) 40 MG capsule TAKE 1 CAPSULE DAILY 90 capsule 3  . ondansetron (ZOFRAN) 4 MG tablet Take 1 tablet (4 mg total) by mouth every 8 (eight) hours as needed. Take  1 tab every 6 hours as needed for nausea and vomiting. 60 tablet 1  . simvastatin (ZOCOR) 40 MG tablet Take 1 tablet (40 mg total) by mouth at bedtime. 90 tablet 2  . SYNTHROID 50 MCG tablet TAKE 1 TABLET DAILY 90 tablet 3  . Fluticasone Furoate-Vilanterol (BREO ELLIPTA) 100-25 MCG/INH AEPB Inhale 1 Act into the lungs daily. (Patient not taking: Reported on 03/13/2016) 1 each 5  . cefUROXime (CEFTIN) 250 MG tablet Take 1 tablet (250 mg total) by mouth 2 (two) times daily. (Patient not taking: Reported on 03/13/2016) 20 tablet 0   No facility-administered medications prior to visit.    ROS Review of Systems  Constitutional: Negative for chills,  activity change, appetite change, fatigue and unexpected weight change.  HENT: Negative for congestion, mouth sores and sinus pressure.   Eyes: Negative for visual disturbance.  Respiratory: Positive for cough. Negative for chest tightness.   Gastrointestinal: Negative for nausea and abdominal pain.  Genitourinary: Negative for frequency, difficulty urinating and vaginal pain.  Musculoskeletal: Negative for back pain and gait problem.  Skin: Negative for pallor and rash.  Neurological: Negative for dizziness, tremors, weakness, numbness and headaches.  Psychiatric/Behavioral: Negative for confusion and sleep disturbance.    Objective:  BP 120/86 mmHg  Pulse 83  Wt 127 lb (57.607 kg)  SpO2 93%  BP Readings from Last 3 Encounters:  03/13/16 120/86  12/08/15 118/74  11/24/15 128/99    Wt Readings from Last 3 Encounters:  03/13/16 127 lb (57.607 kg)  12/08/15 122 lb (55.339 kg)  09/27/15 124 lb (56.246 kg)    Physical Exam  Constitutional: She appears well-developed. No distress.  HENT:  Head: Normocephalic.  Right Ear: External ear normal.  Left Ear: External ear normal.  Nose: Nose normal.  Mouth/Throat: Oropharynx is clear and moist.  Eyes: Conjunctivae are normal. Pupils are equal, round, and reactive to light. Right eye exhibits no discharge. Left eye exhibits no discharge.  Neck: Normal range of motion. Neck supple. No  JVD present. No tracheal deviation present. No thyromegaly present.  Cardiovascular: Normal rate, regular rhythm and normal heart sounds.   Pulmonary/Chest: No stridor. No respiratory distress. She has wheezes.  Abdominal: Soft. Bowel sounds are normal. She exhibits no distension and no mass. There is no tenderness. There is no rebound and no guarding.  Musculoskeletal: She exhibits no edema or tenderness.  Lymphadenopathy:    She has no cervical adenopathy.  Neurological: She displays normal reflexes. No cranial nerve deficit. She exhibits normal muscle  tone. Coordination normal.  Skin: No rash noted. No erythema.  Psychiatric: She has a normal mood and affect. Her behavior is normal. Judgment and thought content normal.    Lab Results  Component Value Date   WBC 11.2* 12/08/2015   HGB 15.8* 12/08/2015   HCT 47.0* 12/08/2015   PLT 174.0 12/08/2015   GLUCOSE 95 12/08/2015   CHOL 131 12/16/2013   TRIG 69.0 12/16/2013   HDL 46.90 12/16/2013   LDLCALC 70 12/16/2013   ALT 14 01/18/2015   AST 15 01/18/2015   NA 140 12/08/2015   K 3.9 12/08/2015   CL 101 12/08/2015   CREATININE 0.75 12/08/2015   BUN 22 12/08/2015   CO2 32 12/08/2015   TSH 1.03 12/08/2015   INR 1.15 08/10/2011   HGBA1C 5.8 10/12/2011    Dg Chest 2 View  09/27/2015  CLINICAL DATA:  Productive cough, wheezing EXAM: CHEST  2 VIEW COMPARISON:  10/28/2013 FINDINGS: Cardiomediastinal silhouette is stable. Hyperinflation and chronic mild interstitial prominence again noted. No acute infiltrate or pulmonary edema. Mild degenerative changes thoracic spine. IMPRESSION: No active cardiopulmonary disease. Stable hyperinflation and chronic mild interstitial prominence. Electronically Signed   By: Lahoma Crocker M.D.   On: 09/27/2015 09:26    Assessment & Plan:   There are no diagnoses linked to this encounter. I have discontinued [name]'s cefUROXime. I am also having her maintain her aspirin, Diclofenac Sodium, Vitamin D3, ondansetron, multivitamin, SYNTHROID, simvastatin, furosemide, donepezil, benzonatate, fluticasone furoate-vilanterol, omeprazole, memantine, and losartan.  No orders of the defined types were placed in this encounter.     Follow-up: No Follow-up on file.  [name], MD

## 2016-03-13 NOTE — Assessment & Plan Note (Signed)
Resolved

## 2016-03-13 NOTE — Progress Notes (Signed)
Pre visit review using our clinic review tool, if applicable. No additional management support is needed unless otherwise documented below in the visit note. 

## 2016-03-13 NOTE — Assessment & Plan Note (Signed)
Discussed.

## 2016-03-15 ENCOUNTER — Ambulatory Visit (INDEPENDENT_AMBULATORY_CARE_PROVIDER_SITE_OTHER): Payer: Medicare Other | Admitting: Neurology

## 2016-03-15 ENCOUNTER — Encounter: Payer: Self-pay | Admitting: Neurology

## 2016-03-15 ENCOUNTER — Telehealth: Payer: Self-pay | Admitting: Internal Medicine

## 2016-03-15 VITALS — BP 124/60 | HR 85 | Ht 62.0 in | Wt 128.0 lb

## 2016-03-15 DIAGNOSIS — F17200 Nicotine dependence, unspecified, uncomplicated: Secondary | ICD-10-CM

## 2016-03-15 DIAGNOSIS — G309 Alzheimer's disease, unspecified: Secondary | ICD-10-CM

## 2016-03-15 DIAGNOSIS — F0280 Dementia in other diseases classified elsewhere without behavioral disturbance: Secondary | ICD-10-CM

## 2016-03-15 MED ORDER — DONEPEZIL HCL 10 MG PO TABS: 10.0000 mg | ORAL_TABLET | Freq: Every day | ORAL | Status: DC

## 2016-03-15 NOTE — Patient Instructions (Signed)
1.  Continue Aricept 10mg  at bedtime and Namenda 10mg  twice daily 2.  Continue going out with friends and participating in church 3.  Try to continue to quit smoking 4.  Recommend playing puzzles (crossword, jigsaw) 5.  Follow up in one year.

## 2016-03-15 NOTE — Progress Notes (Signed)
NEUROLOGY FOLLOW UP OFFICE NOTE  KADEJIA DARRINGTON [id number]  HISTORY OF PRESENT ILLNESS: [name] is a 80 year old right-handed woman with history of GERD, peripheral vascular disease, hyperlipidemia, COPD, hypertension, hypothyroidism, cholecystectomy and anxiety who follows up for Alzheimer's disease.    UPDATE: She is taking Aricept 10mg  and Namenda 10mg  twice daily.  She lives with her husband.  There has been no clinical changes.  There is clutter in the house.  Short-term memory is the primary issue.  Mood is good.  Appetite is good.  She sleeps well.  She and her husband remain active in the church and social with friends.  She says she cut down smoking to 2 packs per day (from 3).  HISTORY: Her husband began to notice changes in memory about 4 years ago.  She would have problems with short-term memory.  She would forget about phone calls.  She would repeat questions. Her husband usually goes to the grocery store.  She has no difficulty remembering names or faces.  She has no hallucinations or delusions.  She sleeps well.  She is not depressed.  There has been no change in personality or behavior.  She is able to perform all her ADLs.  She has gotten lost while driving.  During the day, she tends to the house but spends time watching TV.  She reads the Bible but not everyday.  She and her husband are active in the church.  They go to choir practice every Wednesday night.  They participate in a senior group once a month.  Her mother and maternal aunt had dementia.  She has history of nausea and mild anorexia.  No vomiting.  She had been on both Aricept and rivastigmine at one time.  They were discontinued with concern that it was causing the the nausea.  However, the nausea never resolved.  She has been evaluated by GI with no clear etiology.  She was subsequently restarted on Aricept and then Namenda.  11/05/13 CT HEAD:  no bleed, mass lesions or acute infarcts seen.  Mild  atrophy and atherosclerotic changes noted.  PAST MEDICAL HISTORY: Past Medical History  Diagnosis Date  . GERD (gastroesophageal reflux disease)   . Peripheral vascular disease ([place])   . Hyperlipidemia   . Osteopenia   . Low back pain     OA R radiculopathy  . Lower extremity edema     chronic  . Personal history of colonic polyps 2001    TUBULAR ADENOMA - [name]  . Diverticulosis of colon (without mention of hemorrhage)   . COPD (chronic obstructive pulmonary disease) ([place])   . Hypertension   . Hiatal hernia   . Hypothyroidism   . Anxiety   . Peptic ulcer   . Gallstones   . Pneumonia   . Dementia     MEDICATIONS: Current Outpatient Prescriptions on File Prior to Visit  Medication Sig Dispense Refill  . aspirin 81 MG EC tablet Take 81 mg by mouth daily.     . benzonatate (TESSALON) 200 MG capsule Take 1 capsule (200 mg total) by mouth 2 (two) times daily as needed for cough. 60 capsule 0  . Cholecalciferol (VITAMIN D3) 1000 UNITS CAPS Take 1 capsule by mouth daily.      . Diclofenac Sodium (PENNSAID) 1.5 % SOLN Place 3-5 drops onto the skin 3 (three) times daily as needed. For pain applies on joints    . furosemide (LASIX) 20 MG tablet Take 2 tablets (40  mg total) by mouth daily. 180 tablet 2  . losartan (COZAAR) 50 MG tablet TAKE 1 TABLET TWICE A DAY 180 tablet 2  . memantine (NAMENDA) 10 MG tablet TAKE 1 TABLET TWICE A DAY 180 tablet 2  . Multiple Vitamin (MULTIVITAMIN) tablet Take 1 tablet by mouth daily.    Marland Kitchen omeprazole (PRILOSEC) 40 MG capsule TAKE 1 CAPSULE DAILY 90 capsule 3  . ondansetron (ZOFRAN) 4 MG tablet Take 1 tablet (4 mg total) by mouth every 8 (eight) hours as needed. Take  1 tab every 6 hours as needed for nausea and vomiting. 60 tablet 1  . simvastatin (ZOCOR) 40 MG tablet Take 1 tablet (40 mg total) by mouth at bedtime. 90 tablet 2  . SYNTHROID 50 MCG tablet TAKE 1 TABLET DAILY 90 tablet 3  . umeclidinium bromide (INCRUSE ELLIPTA) 62.5 MCG/INH AEPB Inhale  1 puff into the lungs daily. 1 each 11   No current facility-administered medications on file prior to visit.    ALLERGIES: Allergies  Allergen Reactions  . Codeine Sulfate Other (See Comments)    Extreme headaches  . Exelon [Rivastigmine Tartrate]     nausea  . Oxycodone-Acetaminophen Other (See Comments)    Extreme headaches    FAMILY HISTORY: Family History  Problem Relation Age of Onset  . Dementia Mother   . Hypertension Father   . Heart disease Father   . Colon cancer Sister   . Heart disease Sister   . Esophageal cancer Neg Hx   . Rectal cancer Neg Hx   . Stomach cancer Neg Hx     SOCIAL HISTORY: Social History   Social History  . Marital Status: Married    Spouse Name: N/A  . Number of Children: 4  . Years of Education: N/A   Occupational History  . retired    Social History Main Topics  . Smoking status: Current Every Day Smoker -- 1.50 packs/day for 61 years    Types: Cigarettes  . Smokeless tobacco: Never Used     Comment: Sheet given on smoking  . Alcohol Use: No  . Drug Use: No  . Sexual Activity: No   Other Topics Concern  . Not on file   Social History Narrative    REVIEW OF SYSTEMS: Constitutional: No fevers, chills, or sweats, no generalized fatigue, change in appetite Eyes: No visual changes, double vision, eye pain Ear, nose and throat: No hearing loss, ear pain, nasal congestion, sore throat Cardiovascular: No chest pain, palpitations Respiratory:  No shortness of breath at rest or with exertion, wheezes GastrointestinaI: No nausea, vomiting, diarrhea, abdominal pain, fecal incontinence Genitourinary:  No dysuria, urinary retention or frequency Musculoskeletal:  No neck pain, back pain Integumentary: No rash, pruritus, skin lesions Neurological: as above Psychiatric: No depression, insomnia, anxiety Endocrine: No palpitations, fatigue, diaphoresis, mood swings, change in appetite, change in weight, increased  thirst Hematologic/Lymphatic:  No purpura, petechiae. Allergic/Immunologic: no itchy/runny eyes, nasal congestion, recent allergic reactions, rashes  PHYSICAL EXAM: Filed Vitals:   03/15/16 1027  BP: 124/60  Pulse: 85   General: No acute distress.  Patient appears well-groomed.  normal body habitus. Head:  Normocephalic/atraumatic Eyes:  Fundi examined but not visualized Neck: supple, no paraspinal tenderness, full range of motion Heart:  Regular rate and rhythm Lungs:  Clear to auscultation bilaterally Back: No paraspinal tenderness Neurological Exam: alert and oriented to person, place, and time. Attention span and concentration intact, delayed recall 1/3, remote memory intact, fund of knowledge intact.  Speech  fluent and not dysarthric, language intact.   MMSE - Mini Mental State Exam 03/15/2016 06/22/2015 06/23/2014  Orientation to time 4 2 2   Orientation to Place 4 3 5   Registration 3 3 3   Attention/ Calculation 5 3 5   Recall 0 0 0  Language- name 2 objects 2 2 2   Language- repeat 1 1 1   Language- follow 3 step command 3 3 3   Language- read & follow direction 1 1 1   Write a sentence 1 1 1   Copy design 1 1 1   Total score 25 20 24    CN II-XII intact. Bulk and tone normal, muscle strength 5/5 throughout.  Sensation to light touch intact.  Deep tendon reflexes 2+ throughout.  Finger to nose testing intact.  Gait normal, Romberg negative.  IMPRESSION: Alzheimer's disease, stable Tobacco use  PLAN: 1.  Continue Aricept 10mg  at bedtime and Namenda 10mg  twice daily 2.  Try to quit smoking 3.  Remain social, games (crossword, jigsaw) 4.  Follow up in one year or as needed.  23 minutes spent face to face with patient, over 50% spent discussing management.  Metta Clines, DO  CC:  Lew Dawes, MD

## 2016-03-15 NOTE — Telephone Encounter (Signed)
Can you please call husband regarding a breo sample she was given and was told the first one was free. He states it is not and needs to be mailed in.

## 2016-03-16 NOTE — Telephone Encounter (Signed)
Patient husband called back. Please call him when you have a chance.

## 2016-03-16 NOTE — Telephone Encounter (Signed)
I called pt- she is not sure what is needed. She will have Mr. Hills call me back with more details about what is needed.

## 2016-03-17 MED ORDER — UMECLIDINIUM BROMIDE 62.5 MCG/INH IN AEPB: 1.0000 | INHALATION_SPRAY | Freq: Every day | RESPIRATORY_TRACT | Status: DC

## 2016-03-17 NOTE — Telephone Encounter (Signed)
Request for sample of Incruse Ellipta inhaler until mail order can deliver?   Pt spouse stated that wife was not able to get another free inhaler because she got one 3 months ago.   Resent rx for the incruse [facility] day [facility].

## 2016-03-18 ENCOUNTER — Other Ambulatory Visit: Payer: Self-pay | Admitting: Internal Medicine

## 2016-06-02 ENCOUNTER — Other Ambulatory Visit: Payer: Self-pay | Admitting: Internal Medicine

## 2016-06-19 ENCOUNTER — Encounter: Payer: Self-pay | Admitting: Internal Medicine

## 2016-06-19 ENCOUNTER — Ambulatory Visit (INDEPENDENT_AMBULATORY_CARE_PROVIDER_SITE_OTHER): Payer: Medicare Other | Admitting: Internal Medicine

## 2016-06-19 ENCOUNTER — Other Ambulatory Visit (INDEPENDENT_AMBULATORY_CARE_PROVIDER_SITE_OTHER): Payer: Medicare Other

## 2016-06-19 DIAGNOSIS — J449 Chronic obstructive pulmonary disease, unspecified: Secondary | ICD-10-CM

## 2016-06-19 DIAGNOSIS — E785 Hyperlipidemia, unspecified: Secondary | ICD-10-CM

## 2016-06-19 DIAGNOSIS — R609 Edema, unspecified: Secondary | ICD-10-CM

## 2016-06-19 DIAGNOSIS — I739 Peripheral vascular disease, unspecified: Secondary | ICD-10-CM

## 2016-06-19 DIAGNOSIS — G309 Alzheimer's disease, unspecified: Secondary | ICD-10-CM | POA: Diagnosis not present

## 2016-06-19 DIAGNOSIS — F0280 Dementia in other diseases classified elsewhere without behavioral disturbance: Secondary | ICD-10-CM

## 2016-06-19 DIAGNOSIS — Z23 Encounter for immunization: Secondary | ICD-10-CM

## 2016-06-19 LAB — TSH: TSH: 1.39 u[IU]/mL (ref 0.35–4.50)

## 2016-06-19 LAB — BASIC METABOLIC PANEL
BUN: 19 mg/dL (ref 6–23)
CHLORIDE: 101 meq/L (ref 96–112)
CO2: 33 meq/L — AB (ref 19–32)
CREATININE: 0.79 mg/dL (ref 0.40–1.20)
Calcium: 9.2 mg/dL (ref 8.4–10.5)
GFR: 74.7 mL/min (ref >60.00)
GLUCOSE: 101 mg/dL — AB (ref 70–99)
POTASSIUM: 4.5 meq/L (ref 3.5–5.1)
Sodium: 140 mEq/L (ref 135–145)

## 2016-06-19 LAB — HEPATIC FUNCTION PANEL
ALK PHOS: 72 U/L (ref 39–117)
ALT: 14 U/L (ref 0–35)
AST: 18 U/L (ref 0–37)
Albumin: 4.1 g/dL (ref 3.5–5.2)
BILIRUBIN DIRECT: 0.2 mg/dL (ref 0.0–0.3)
TOTAL PROTEIN: 6.6 g/dL (ref 6.0–8.3)
Total Bilirubin: 0.7 mg/dL (ref 0.2–1.2)

## 2016-06-19 NOTE — Assessment & Plan Note (Signed)
No change 

## 2016-06-19 NOTE — Addendum Note (Signed)
Addended by: Cresenciano Lick on: 06/19/2016 05:14 PM   Modules accepted: Orders

## 2016-06-19 NOTE — Progress Notes (Signed)
Pre visit review using our clinic review tool, if applicable. No additional management support is needed unless otherwise documented below in the visit note. 

## 2016-06-19 NOTE — Assessment & Plan Note (Signed)
On Simvastatin, ASA

## 2016-06-19 NOTE — Progress Notes (Signed)
Subjective:  Patient ID: [name], female    DOB: 1936/04/06  Age: 80 y.o. MRN: [id number]  CC: No chief complaint on file.   HPI Khristie Argomaniz Dimare presents for memory loss, COPD, dyslipidemia f/u  Outpatient Medications Prior to Visit  Medication Sig Dispense Refill  . aspirin 81 MG EC tablet Take 81 mg by mouth daily.     . benzonatate (TESSALON) 200 MG capsule Take 1 capsule (200 mg total) by mouth 2 (two) times daily as needed for cough. 60 capsule 0  . Cholecalciferol (VITAMIN D3) 1000 UNITS CAPS Take 1 capsule by mouth daily.      . Diclofenac Sodium (PENNSAID) 1.5 % SOLN Place 3-5 drops onto the skin 3 (three) times daily as needed. For pain applies on joints    . donepezil (ARICEPT) 10 MG tablet Take 1 tablet (10 mg total) by mouth at bedtime. 90 tablet 3  . furosemide (LASIX) 20 MG tablet TAKE 2 TABLETS DAILY 180 tablet 3  . losartan (COZAAR) 50 MG tablet TAKE 1 TABLET TWICE A DAY 180 tablet 2  . memantine (NAMENDA) 10 MG tablet TAKE 1 TABLET TWICE A DAY 180 tablet 2  . Multiple Vitamin (MULTIVITAMIN) tablet Take 1 tablet by mouth daily.    Marland Kitchen omeprazole (PRILOSEC) 40 MG capsule TAKE 1 CAPSULE DAILY 90 capsule 3  . ondansetron (ZOFRAN) 4 MG tablet Take 1 tablet (4 mg total) by mouth every 8 (eight) hours as needed. Take  1 tab every 6 hours as needed for nausea and vomiting. 60 tablet 1  . simvastatin (ZOCOR) 40 MG tablet TAKE 1 TABLET AT BEDTIME 90 tablet 3  . SYNTHROID 50 MCG tablet TAKE 1 TABLET DAILY 90 tablet 2  . umeclidinium bromide (INCRUSE ELLIPTA) 62.5 MCG/INH AEPB Inhale 1 puff into the lungs daily. 3 each 1   No facility-administered medications prior to visit.     ROS Review of Systems  Constitutional: Positive for fatigue. Negative for activity change, appetite change, chills and unexpected weight change.  HENT: Negative for congestion, mouth sores and sinus pressure.   Eyes: Negative for visual disturbance.  Respiratory: Positive for  cough. Negative for chest tightness.   Gastrointestinal: Negative for abdominal pain and nausea.  Genitourinary: Negative for difficulty urinating, frequency and vaginal pain.  Musculoskeletal: Negative for back pain and gait problem.  Skin: Negative for pallor and rash.  Neurological: Negative for dizziness, tremors, weakness, numbness and headaches.  Psychiatric/Behavioral: Positive for decreased concentration. Negative for confusion, sleep disturbance and suicidal ideas. The patient is nervous/anxious.     Objective:  BP (!) 160/82   Pulse 80   Temp 98.2 F (36.8 C) (Oral)   Wt 132 lb (59.9 kg)   SpO2 94%   BMI 24.14 kg/m   BP Readings from Last 3 Encounters:  06/19/16 (!) 160/82  03/15/16 124/60  03/13/16 120/86    Wt Readings from Last 3 Encounters:  06/19/16 132 lb (59.9 kg)  03/15/16 128 lb (58.1 kg)  03/13/16 127 lb (57.6 kg)    Physical Exam  Constitutional: She appears well-developed. No distress.  HENT:  Head: Normocephalic.  Right Ear: External ear normal.  Left Ear: External ear normal.  Nose: Nose normal.  Mouth/Throat: Oropharynx is clear and moist.  Eyes: Conjunctivae are normal. Pupils are equal, round, and reactive to light. Right eye exhibits no discharge. Left eye exhibits no discharge.  Neck: Normal range of motion. Neck supple. No JVD present. No tracheal deviation present. No thyromegaly  present.  Cardiovascular: Normal rate, regular rhythm and normal heart sounds.   Pulmonary/Chest: No stridor. No respiratory distress. She has no wheezes.  Abdominal: Soft. Bowel sounds are normal. She exhibits no distension and no mass. There is no tenderness. There is no rebound and no guarding.  Musculoskeletal: She exhibits no edema or tenderness.  Lymphadenopathy:    She has no cervical adenopathy.  Neurological: She displays normal reflexes. No cranial nerve deficit. She exhibits normal muscle tone. Coordination normal.  Skin: No rash noted. No erythema.    Psychiatric: She has a normal mood and affect. Her behavior is normal. Thought content normal.    Lab Results  Component Value Date   WBC 11.2 (H) 12/08/2015   HGB 15.8 (H) 12/08/2015   HCT 47.0 (H) 12/08/2015   PLT 174.0 12/08/2015   GLUCOSE 95 12/08/2015   CHOL 131 12/16/2013   TRIG 69.0 12/16/2013   HDL 46.90 12/16/2013   LDLCALC 70 12/16/2013   ALT 14 01/18/2015   AST 15 01/18/2015   NA 140 12/08/2015   K 3.9 12/08/2015   CL 101 12/08/2015   CREATININE 0.75 12/08/2015   BUN 22 12/08/2015   CO2 32 12/08/2015   TSH 1.03 12/08/2015   INR 1.15 08/10/2011   HGBA1C 5.8 10/12/2011    Dg Chest 2 View  Result Date: 09/27/2015 CLINICAL DATA:  Productive cough, wheezing EXAM: CHEST  2 VIEW COMPARISON:  10/28/2013 FINDINGS: Cardiomediastinal silhouette is stable. Hyperinflation and chronic mild interstitial prominence again noted. No acute infiltrate or pulmonary edema. Mild degenerative changes thoracic spine. IMPRESSION: No active cardiopulmonary disease. Stable hyperinflation and chronic mild interstitial prominence. Electronically Signed   By: Lahoma Crocker M.D.   On: 09/27/2015 09:26    Assessment & Plan:   There are no diagnoses linked to this encounter. I am having Ms. Morken maintain her aspirin, Diclofenac Sodium, Vitamin D3, ondansetron, multivitamin, benzonatate, omeprazole, memantine, losartan, donepezil, umeclidinium bromide, simvastatin, furosemide, and SYNTHROID.  No orders of the defined types were placed in this encounter.    Follow-up: No Follow-up on file.  [name], MD

## 2016-06-19 NOTE — Assessment & Plan Note (Signed)
Aricept and Namenda po 

## 2016-06-19 NOTE — Assessment & Plan Note (Signed)
Simvastatin, ASA 

## 2016-06-19 NOTE — Assessment & Plan Note (Signed)
Smoker 

## 2016-08-26 ENCOUNTER — Other Ambulatory Visit: Payer: Self-pay | Admitting: Internal Medicine

## 2016-09-18 ENCOUNTER — Ambulatory Visit (INDEPENDENT_AMBULATORY_CARE_PROVIDER_SITE_OTHER): Payer: Medicare Other | Admitting: Internal Medicine

## 2016-09-18 ENCOUNTER — Encounter: Payer: Self-pay | Admitting: Internal Medicine

## 2016-09-18 DIAGNOSIS — J449 Chronic obstructive pulmonary disease, unspecified: Secondary | ICD-10-CM | POA: Diagnosis not present

## 2016-09-18 DIAGNOSIS — K219 Gastro-esophageal reflux disease without esophagitis: Secondary | ICD-10-CM | POA: Diagnosis not present

## 2016-09-18 DIAGNOSIS — I10 Essential (primary) hypertension: Secondary | ICD-10-CM | POA: Diagnosis not present

## 2016-09-18 DIAGNOSIS — Z23 Encounter for immunization: Secondary | ICD-10-CM | POA: Diagnosis not present

## 2016-09-18 DIAGNOSIS — E785 Hyperlipidemia, unspecified: Secondary | ICD-10-CM

## 2016-09-18 DIAGNOSIS — R634 Abnormal weight loss: Secondary | ICD-10-CM

## 2016-09-18 NOTE — Progress Notes (Signed)
Pre visit review using our clinic review tool, if applicable. No additional management support is needed unless otherwise documented below in the visit note. 

## 2016-09-18 NOTE — Assessment & Plan Note (Signed)
Losartan 

## 2016-09-18 NOTE — Assessment & Plan Note (Signed)
Wt Readings from Last 3 Encounters:  09/18/16 133 lb (60.3 kg)  06/19/16 132 lb (59.9 kg)  03/15/16 128 lb (58.1 kg)  resolved

## 2016-09-18 NOTE — Assessment & Plan Note (Signed)
Simvastatin ASA

## 2016-09-18 NOTE — Assessment & Plan Note (Signed)
Doing well 

## 2016-09-18 NOTE — Progress Notes (Signed)
Subjective:  Patient ID: [name], female    DOB: 11/30/1935  Age: 80 y.o. MRN: [id number]  CC: No chief complaint on file.   HPI [name] presents for HTN, COPD, dementia f/u  Outpatient Medications Prior to Visit  Medication Sig Dispense Refill  . aspirin 81 MG EC tablet Take 81 mg by mouth daily.     . benzonatate (TESSALON) 200 MG capsule Take 1 capsule (200 mg total) by mouth 2 (two) times daily as needed for cough. 60 capsule 0  . Cholecalciferol (VITAMIN D3) 1000 UNITS CAPS Take 1 capsule by mouth daily.      . Diclofenac Sodium (PENNSAID) 1.5 % SOLN Place 3-5 drops onto the skin 3 (three) times daily as needed. For pain applies on joints    . donepezil (ARICEPT) 10 MG tablet Take 1 tablet (10 mg total) by mouth at bedtime. 90 tablet 3  . furosemide (LASIX) 20 MG tablet TAKE 2 TABLETS DAILY 180 tablet 3  . INCRUSE ELLIPTA 62.5 MCG/INH AEPB USE 1 INHALATION DAILY 90 each 3  . losartan (COZAAR) 50 MG tablet TAKE 1 TABLET TWICE A DAY 180 tablet 2  . memantine (NAMENDA) 10 MG tablet TAKE 1 TABLET TWICE A DAY 180 tablet 2  . Multiple Vitamin (MULTIVITAMIN) tablet Take 1 tablet by mouth daily.    Marland Kitchen omeprazole (PRILOSEC) 40 MG capsule TAKE 1 CAPSULE DAILY 90 capsule 3  . ondansetron (ZOFRAN) 4 MG tablet Take 1 tablet (4 mg total) by mouth every 8 (eight) hours as needed. Take  1 tab every 6 hours as needed for nausea and vomiting. 60 tablet 1  . simvastatin (ZOCOR) 40 MG tablet TAKE 1 TABLET AT BEDTIME 90 tablet 3  . SYNTHROID 50 MCG tablet TAKE 1 TABLET DAILY 90 tablet 2   No facility-administered medications prior to visit.     ROS Review of Systems  Constitutional: Positive for fatigue. Negative for activity change, appetite change, chills and unexpected weight change.  HENT: Negative for congestion, mouth sores and sinus pressure.   Eyes: Negative for visual disturbance.  Respiratory: Positive for cough. Negative for chest tightness and shortness of  breath.   Gastrointestinal: Negative for abdominal pain and nausea.  Genitourinary: Negative for difficulty urinating, frequency and vaginal pain.  Musculoskeletal: Negative for back pain and gait problem.  Skin: Negative for pallor and rash.  Neurological: Negative for dizziness, tremors, weakness, numbness and headaches.  Psychiatric/Behavioral: Positive for decreased concentration. Negative for confusion, sleep disturbance and suicidal ideas. The patient is nervous/anxious.     Objective:  BP (!) 150/80   Pulse 82   Wt 133 lb (60.3 kg)   SpO2 90%   BMI 24.33 kg/m   BP Readings from Last 3 Encounters:  09/18/16 (!) 150/80  06/19/16 135/84  03/15/16 124/60    Wt Readings from Last 3 Encounters:  09/18/16 133 lb (60.3 kg)  06/19/16 132 lb (59.9 kg)  03/15/16 128 lb (58.1 kg)    Physical Exam  Constitutional: She appears well-developed. No distress.  HENT:  Head: Normocephalic.  Right Ear: External ear normal.  Left Ear: External ear normal.  Nose: Nose normal.  Mouth/Throat: Oropharynx is clear and moist.  Eyes: Conjunctivae are normal. Pupils are equal, round, and reactive to light. Right eye exhibits no discharge. Left eye exhibits no discharge.  Neck: Normal range of motion. Neck supple. No JVD present. No tracheal deviation present. No thyromegaly present.  Cardiovascular: Normal rate, regular rhythm and normal heart  sounds.   Pulmonary/Chest: No stridor. No respiratory distress. She has no wheezes.  Abdominal: Soft. Bowel sounds are normal. She exhibits no distension and no mass. There is no tenderness. There is no rebound and no guarding.  Musculoskeletal: She exhibits no edema or tenderness.  Lymphadenopathy:    She has no cervical adenopathy.  Neurological: She displays normal reflexes. No cranial nerve deficit. She exhibits normal muscle tone. Coordination normal.  Skin: No rash noted. No erythema.  Psychiatric: She has a normal mood and affect. Her behavior is  normal. Judgment and thought content normal.    Lab Results  Component Value Date   WBC 11.2 (H) 12/08/2015   HGB 15.8 (H) 12/08/2015   HCT 47.0 (H) 12/08/2015   PLT 174.0 12/08/2015   GLUCOSE 101 (H) 06/19/2016   CHOL 131 12/16/2013   TRIG 69.0 12/16/2013   HDL 46.90 12/16/2013   LDLCALC 70 12/16/2013   ALT 14 06/19/2016   AST 18 06/19/2016   NA 140 06/19/2016   K 4.5 06/19/2016   CL 101 06/19/2016   CREATININE 0.79 06/19/2016   BUN 19 06/19/2016   CO2 33 (H) 06/19/2016   TSH 1.39 06/19/2016   INR 1.15 08/10/2011   HGBA1C 5.8 10/12/2011    Dg Chest 2 View  Result Date: 09/27/2015 CLINICAL DATA:  Productive cough, wheezing EXAM: CHEST  2 VIEW COMPARISON:  10/28/2013 FINDINGS: Cardiomediastinal silhouette is stable. Hyperinflation and chronic mild interstitial prominence again noted. No acute infiltrate or pulmonary edema. Mild degenerative changes thoracic spine. IMPRESSION: No active cardiopulmonary disease. Stable hyperinflation and chronic mild interstitial prominence. Electronically Signed   By: Lahoma Crocker M.D.   On: 09/27/2015 09:26    Assessment & Plan:   Diagnoses and all orders for this visit:  Essential hypertension  Gastroesophageal reflux disease without esophagitis  Chronic obstructive pulmonary disease, unspecified COPD type ([place])  Dyslipidemia  Unexplained weight loss   I am having Ms. Payne maintain her aspirin, Diclofenac Sodium, Vitamin D3, ondansetron, multivitamin, benzonatate, omeprazole, memantine, losartan, donepezil, simvastatin, furosemide, SYNTHROID, and INCRUSE ELLIPTA.  No orders of the defined types were placed in this encounter.    Follow-up: No Follow-up on file.  [name], MD

## 2016-09-18 NOTE — Assessment & Plan Note (Signed)
Smoking discussed 

## 2016-11-03 ENCOUNTER — Other Ambulatory Visit: Payer: Self-pay | Admitting: Internal Medicine

## 2016-11-24 ENCOUNTER — Other Ambulatory Visit: Payer: Self-pay | Admitting: Internal Medicine

## 2016-12-19 ENCOUNTER — Ambulatory Visit: Payer: Medicare Other | Admitting: Internal Medicine

## 2016-12-31 ENCOUNTER — Other Ambulatory Visit: Payer: Self-pay | Admitting: Internal Medicine

## 2017-01-03 ENCOUNTER — Ambulatory Visit (INDEPENDENT_AMBULATORY_CARE_PROVIDER_SITE_OTHER): Payer: Medicare Other | Admitting: Internal Medicine

## 2017-01-03 ENCOUNTER — Other Ambulatory Visit (INDEPENDENT_AMBULATORY_CARE_PROVIDER_SITE_OTHER): Payer: Medicare Other

## 2017-01-03 ENCOUNTER — Encounter: Payer: Self-pay | Admitting: Internal Medicine

## 2017-01-03 DIAGNOSIS — I739 Peripheral vascular disease, unspecified: Secondary | ICD-10-CM | POA: Diagnosis not present

## 2017-01-03 DIAGNOSIS — R413 Other amnesia: Secondary | ICD-10-CM

## 2017-01-03 DIAGNOSIS — E034 Atrophy of thyroid (acquired): Secondary | ICD-10-CM | POA: Diagnosis not present

## 2017-01-03 DIAGNOSIS — J449 Chronic obstructive pulmonary disease, unspecified: Secondary | ICD-10-CM

## 2017-01-03 DIAGNOSIS — I10 Essential (primary) hypertension: Secondary | ICD-10-CM

## 2017-01-03 LAB — HEPATIC FUNCTION PANEL
ALBUMIN: 4.1 g/dL (ref 3.5–5.2)
ALT: 12 U/L (ref 0–35)
AST: 16 U/L (ref 0–37)
Alkaline Phosphatase: 82 U/L (ref 39–117)
BILIRUBIN TOTAL: 0.7 mg/dL (ref 0.2–1.2)
Bilirubin, Direct: 0.1 mg/dL (ref 0.0–0.3)
TOTAL PROTEIN: 6.6 g/dL (ref 6.0–8.3)

## 2017-01-03 LAB — BASIC METABOLIC PANEL
BUN: 23 mg/dL (ref 6–23)
CO2: 31 mEq/L (ref 19–32)
Calcium: 9.3 mg/dL (ref 8.4–10.5)
Chloride: 103 mEq/L (ref 96–112)
Creatinine, Ser: 0.83 mg/dL (ref 0.40–1.20)
GFR: 70.46 mL/min (ref >60.00)
Glucose, Bld: 129 mg/dL — ABNORMAL HIGH (ref 70–99)
POTASSIUM: 3.8 meq/L (ref 3.5–5.1)
SODIUM: 139 meq/L (ref 135–145)

## 2017-01-03 NOTE — Assessment & Plan Note (Signed)
Levothroid 

## 2017-01-03 NOTE — Assessment & Plan Note (Signed)
Discussed.

## 2017-01-03 NOTE — Progress Notes (Signed)
Pre-visit discussion using our clinic review tool. No additional management support is needed unless otherwise documented below in the visit note.  

## 2017-01-03 NOTE — Patient Instructions (Signed)
Shingrix vaccine

## 2017-01-03 NOTE — Assessment & Plan Note (Signed)
Losartan 

## 2017-01-03 NOTE — Assessment & Plan Note (Signed)
Aricept Labs

## 2017-01-03 NOTE — Assessment & Plan Note (Signed)
Simvastatin and ASA

## 2017-01-03 NOTE — Progress Notes (Signed)
Subjective:  Patient ID: [name], female    DOB: 1935/11/27  Age: 81 y.o. MRN: [id number]  CC: Hypertension; COPD; Hypothyroidism; and Gastroesophageal Reflux   HPI [name] presents for memory loss, HTN, hypothyroidism, COPD f/u  Outpatient Medications Prior to Visit  Medication Sig Dispense Refill  . aspirin 81 MG EC tablet Take 81 mg by mouth daily.     . benzonatate (TESSALON) 200 MG capsule Take 1 capsule (200 mg total) by mouth 2 (two) times daily as needed for cough. 60 capsule 0  . Cholecalciferol (VITAMIN D3) 1000 UNITS CAPS Take 1 capsule by mouth daily.      . Diclofenac Sodium (PENNSAID) 1.5 % SOLN Place 3-5 drops onto the skin 3 (three) times daily as needed. For pain applies on joints    . donepezil (ARICEPT) 10 MG tablet Take 1 tablet (10 mg total) by mouth at bedtime. 90 tablet 3  . furosemide (LASIX) 20 MG tablet TAKE 2 TABLETS DAILY 180 tablet 3  . INCRUSE ELLIPTA 62.5 MCG/INH AEPB USE 1 INHALATION DAILY 90 each 3  . losartan (COZAAR) 50 MG tablet TAKE 1 TABLET TWICE A DAY 180 tablet 2  . memantine (NAMENDA) 10 MG tablet TAKE 1 TABLET TWICE A DAY 180 tablet 2  . Multiple Vitamin (MULTIVITAMIN) tablet Take 1 tablet by mouth daily.    Marland Kitchen omeprazole (PRILOSEC) 40 MG capsule TAKE 1 CAPSULE DAILY 90 capsule 2  . ondansetron (ZOFRAN) 4 MG tablet Take 1 tablet (4 mg total) by mouth every 8 (eight) hours as needed. Take  1 tab every 6 hours as needed for nausea and vomiting. 60 tablet 1  . simvastatin (ZOCOR) 40 MG tablet TAKE 1 TABLET AT BEDTIME 90 tablet 3  . SYNTHROID 50 MCG tablet TAKE 1 TABLET DAILY 90 tablet 2   No facility-administered medications prior to visit.     ROS Review of Systems  Constitutional: Negative for activity change, appetite change, chills, fatigue and unexpected weight change.  HENT: Negative for congestion, mouth sores and sinus pressure.   Eyes: Negative for visual disturbance.  Respiratory: Positive for cough.  Negative for chest tightness.   Gastrointestinal: Negative for abdominal pain and nausea.  Genitourinary: Negative for difficulty urinating, frequency and vaginal pain.  Musculoskeletal: Negative for back pain and gait problem.  Skin: Negative for pallor and rash.  Neurological: Negative for dizziness, tremors, weakness, numbness and headaches.  Psychiatric/Behavioral: Positive for decreased concentration. Negative for confusion and sleep disturbance.    Objective:  BP 116/72   Pulse 79   Temp 98.5 F (36.9 C) (Oral)   Resp 16   Ht 5\' 2"  (1.575 m)   Wt 135 lb 12 oz (61.6 kg)   SpO2 97%   BMI 24.83 kg/m   BP Readings from Last 3 Encounters:  01/03/17 116/72  09/18/16 130/83  06/19/16 135/84    Wt Readings from Last 3 Encounters:  01/03/17 135 lb 12 oz (61.6 kg)  09/18/16 133 lb (60.3 kg)  06/19/16 132 lb (59.9 kg)    Physical Exam  Constitutional: She appears well-developed. No distress.  HENT:  Head: Normocephalic.  Right Ear: External ear normal.  Left Ear: External ear normal.  Nose: Nose normal.  Mouth/Throat: Oropharynx is clear and moist.  Eyes: Conjunctivae are normal. Pupils are equal, round, and reactive to light. Right eye exhibits no discharge. Left eye exhibits no discharge.  Neck: Normal range of motion. Neck supple. No JVD present. No tracheal deviation present. No  thyromegaly present.  Cardiovascular: Normal rate, regular rhythm and normal heart sounds.   Pulmonary/Chest: No stridor. No respiratory distress. She has no wheezes.  Abdominal: Soft. Bowel sounds are normal. She exhibits no distension and no mass. There is no tenderness. There is no rebound and no guarding.  Musculoskeletal: She exhibits no edema or tenderness.  Lymphadenopathy:    She has no cervical adenopathy.  Neurological: She displays normal reflexes. No cranial nerve deficit. She exhibits normal muscle tone. Coordination normal.  Skin: No rash noted. No erythema.  Psychiatric: She  has a normal mood and affect. Her behavior is normal. Judgment and thought content normal.    Lab Results  Component Value Date   WBC 11.2 (H) 12/08/2015   HGB 15.8 (H) 12/08/2015   HCT 47.0 (H) 12/08/2015   PLT 174.0 12/08/2015   GLUCOSE 101 (H) 06/19/2016   CHOL 131 12/16/2013   TRIG 69.0 12/16/2013   HDL 46.90 12/16/2013   LDLCALC 70 12/16/2013   ALT 14 06/19/2016   AST 18 06/19/2016   NA 140 06/19/2016   K 4.5 06/19/2016   CL 101 06/19/2016   CREATININE 0.79 06/19/2016   BUN 19 06/19/2016   CO2 33 (H) 06/19/2016   TSH 1.39 06/19/2016   INR 1.15 08/10/2011   HGBA1C 5.8 10/12/2011    Dg Chest 2 View  Result Date: 09/27/2015 CLINICAL DATA:  Productive cough, wheezing EXAM: CHEST  2 VIEW COMPARISON:  10/28/2013 FINDINGS: Cardiomediastinal silhouette is stable. Hyperinflation and chronic mild interstitial prominence again noted. No acute infiltrate or pulmonary edema. Mild degenerative changes thoracic spine. IMPRESSION: No active cardiopulmonary disease. Stable hyperinflation and chronic mild interstitial prominence. Electronically Signed   By: Lahoma Crocker M.D.   On: 09/27/2015 09:26    Assessment & Plan:   There are no diagnoses linked to this encounter. I am having [name] maintain her aspirin, Diclofenac Sodium, Vitamin D3, ondansetron, multivitamin, benzonatate, donepezil, simvastatin, furosemide, SYNTHROID, INCRUSE ELLIPTA, memantine, losartan, and omeprazole.  No orders of the defined types were placed in this encounter.    Follow-up: No Follow-up on file.  [name], MD

## 2017-01-15 ENCOUNTER — Telehealth: Payer: Self-pay | Admitting: Internal Medicine

## 2017-01-15 NOTE — Telephone Encounter (Signed)
Called patient to schedule awv. Patient did not answer. Will try to call patient again at a later time.

## 2017-02-27 ENCOUNTER — Other Ambulatory Visit: Payer: Self-pay | Admitting: Internal Medicine

## 2017-03-10 ENCOUNTER — Other Ambulatory Visit: Payer: Self-pay | Admitting: Neurology

## 2017-03-15 ENCOUNTER — Ambulatory Visit (INDEPENDENT_AMBULATORY_CARE_PROVIDER_SITE_OTHER): Payer: Medicare Other | Admitting: Neurology

## 2017-03-15 ENCOUNTER — Other Ambulatory Visit: Payer: Self-pay | Admitting: Internal Medicine

## 2017-03-15 ENCOUNTER — Encounter: Payer: Self-pay | Admitting: Neurology

## 2017-03-15 VITALS — BP 100/60 | HR 80 | Ht 62.0 in | Wt 130.0 lb

## 2017-03-15 DIAGNOSIS — F17200 Nicotine dependence, unspecified, uncomplicated: Secondary | ICD-10-CM | POA: Diagnosis not present

## 2017-03-15 DIAGNOSIS — G301 Alzheimer's disease with late onset: Secondary | ICD-10-CM | POA: Diagnosis not present

## 2017-03-15 DIAGNOSIS — F0280 Dementia in other diseases classified elsewhere without behavioral disturbance: Secondary | ICD-10-CM | POA: Diagnosis not present

## 2017-03-15 NOTE — Patient Instructions (Signed)
1.  Continue donepezil 10mg  at bedtime and memantine 10mg  twice daily.  Ask your insurance about the combination pill, Namzaric. 2.  Try to work on quitting smoking 3.  Follow up in one year or as needed.

## 2017-03-15 NOTE — Progress Notes (Addendum)
NEUROLOGY FOLLOW UP OFFICE NOTE  KARINNE SCHMADER [id number]  HISTORY OF PRESENT ILLNESS: [name] is a 81 year old right-handed woman with history of GERD, peripheral vascular disease, hyperlipidemia, COPD, hypertension, hypothyroidism, cholecystectomy and anxiety who follows up for Alzheimer's disease.  She is accompanied by her husband who supplements history.   UPDATE: She is taking Aricept 10mg  and Namenda 10mg  twice daily.  She has been doing well.  There has been no change in memory.  She still smokes.  She is not driving.  She still participates in choir and the senior group.  She sleeps well at night but naps often during the day.  She denies depression.   HISTORY: Her husband began to notice changes in memory around 2012 or 2013.  She would have problems with short-term memory.  She would forget about phone calls.  She would repeat questions. Her husband usually goes to the grocery store.  She has no difficulty remembering names or faces.  She has no hallucinations or delusions.  She sleeps well.  She is not depressed.  There has been no change in personality or behavior.  She is able to perform all her ADLs.  She has gotten lost while driving.  During the day, she tends to the house but spends time watching TV.  She reads the Bible but not everyday.  She and her husband are active in the church.  They go to choir practice every Wednesday night.  They participate in a senior group once a month.  Her mother and maternal aunt had dementia.   She has history of nausea and mild anorexia.  No vomiting.  She had been on both Aricept and rivastigmine at one time and were discontinued with concern that it was causing the nausea.  However, the nausea never resolved.  She has been evaluated by GI with no clear etiology.  She was subsequently restarted on Aricept and then Namenda.   11/05/13 CT HEAD:  no bleed, mass lesions or acute infarcts seen.  Mild atrophy and atherosclerotic  changes noted.  PAST MEDICAL HISTORY: Past Medical History:  Diagnosis Date  . Anxiety   . COPD (chronic obstructive pulmonary disease) ([place])   . Dementia   . Diverticulosis of colon (without mention of hemorrhage)   . Gallstones   . GERD (gastroesophageal reflux disease)   . Hiatal hernia   . Hyperlipidemia   . Hypertension   . Hypothyroidism   . Low back pain    OA R radiculopathy  . Lower extremity edema    chronic  . Osteopenia   . Peptic ulcer   . Peripheral vascular disease ([place])   . Personal history of colonic polyps 2001   TUBULAR ADENOMA - [name]  . Pneumonia     MEDICATIONS: Current Outpatient Prescriptions on File Prior to Visit  Medication Sig Dispense Refill  . aspirin 81 MG EC tablet Take 81 mg by mouth daily.     . benzonatate (TESSALON) 200 MG capsule Take 1 capsule (200 mg total) by mouth 2 (two) times daily as needed for cough. 60 capsule 0  . Cholecalciferol (VITAMIN D3) 1000 UNITS CAPS Take 1 capsule by mouth daily.      . Diclofenac Sodium (PENNSAID) 1.5 % SOLN Place 3-5 drops onto the skin 3 (three) times daily as needed. For pain applies on joints    . donepezil (ARICEPT) 10 MG tablet TAKE 1 TABLET AT BEDTIME 90 tablet 3  . INCRUSE ELLIPTA 62.5 MCG/INH AEPB  USE 1 INHALATION DAILY 90 each 3  . losartan (COZAAR) 50 MG tablet TAKE 1 TABLET TWICE A DAY 180 tablet 2  . memantine (NAMENDA) 10 MG tablet TAKE 1 TABLET TWICE A DAY 180 tablet 2  . Multiple Vitamin (MULTIVITAMIN) tablet Take 1 tablet by mouth daily.    Marland Kitchen omeprazole (PRILOSEC) 40 MG capsule TAKE 1 CAPSULE DAILY 90 capsule 2  . ondansetron (ZOFRAN) 4 MG tablet Take 1 tablet (4 mg total) by mouth every 8 (eight) hours as needed. Take  1 tab every 6 hours as needed for nausea and vomiting. 60 tablet 1  . SYNTHROID 50 MCG tablet TAKE 1 TABLET DAILY 90 tablet 1   No current facility-administered medications on file prior to visit.     ALLERGIES: Allergies  Allergen Reactions  . Codeine Sulfate  Other (See Comments)    Extreme headaches  . Exelon [Rivastigmine Tartrate]     nausea  . Oxycodone-Acetaminophen Other (See Comments)    Extreme headaches    FAMILY HISTORY: Family History  Problem Relation Age of Onset  . Dementia Mother   . Hypertension Father   . Heart disease Father   . Colon cancer Sister   . Heart disease Sister   . Esophageal cancer Neg Hx   . Rectal cancer Neg Hx   . Stomach cancer Neg Hx     SOCIAL HISTORY: Social History   Social History  . Marital status: Married    Spouse name: N/A  . Number of children: 4  . Years of education: N/A   Occupational History  . retired    Social History Main Topics  . Smoking status: Current Every Day Smoker    Packs/day: 1.50    Years: 61.00    Types: Cigarettes  . Smokeless tobacco: Never Used     Comment: Sheet given on smoking  . Alcohol use No  . Drug use: No  . Sexual activity: No   Other Topics Concern  . Not on file   Social History Narrative  . No narrative on file    REVIEW OF SYSTEMS: Constitutional: No fevers, chills, or sweats, no generalized fatigue, change in appetite Eyes: No visual changes, double vision, eye pain Ear, nose and throat: No hearing loss, ear pain, nasal congestion, sore throat Cardiovascular: No chest pain, palpitations Respiratory:  No shortness of breath at rest or with exertion, wheezes GastrointestinaI: No nausea, vomiting, diarrhea, abdominal pain, fecal incontinence Genitourinary:  No dysuria, urinary retention or frequency Musculoskeletal:  No neck pain, back pain Integumentary: No rash, pruritus, skin lesions Neurological: as above Psychiatric: No depression, insomnia, anxiety Endocrine: No palpitations, fatigue, diaphoresis, mood swings, change in appetite, change in weight, increased thirst Hematologic/Lymphatic:  No purpura, petechiae. Allergic/Immunologic: no itchy/runny eyes, nasal congestion, recent allergic reactions, rashes  PHYSICAL  EXAM: Vitals:   03/15/17 1132  BP: 100/60  Pulse: 80   General: No acute distress.  Patient appears well-groomed.  normal body habitus. Head:  Normocephalic/atraumatic Eyes:  Fundi examined but not visualized Neck: supple, no paraspinal tenderness, full range of motion Heart:  Regular rate and rhythm Lungs:  Clear to auscultation bilaterally Back: No paraspinal tenderness Neurological Exam: alert and oriented to person, place, and time. Attention span and concentration intact, recent memory poor, remote memory intact, fund of knowledge intact.  Speech fluent and not dysarthric, language intact.   MMSE - Mini Mental State Exam 03/15/2017 03/15/2016 06/22/2015 06/23/2014  Orientation to time 4 4 2 2   Orientation to Place  3 4 3 5   Registration 3 3 3 3   Attention/ Calculation 5 5 3 5   Recall 0 0 0 0  Language- name 2 objects 2 2 2 2   Language- repeat 1 1 1 1   Language- follow 3 step command 3 3 3 3   Language- read & follow direction 1 1 1 1   Write a sentence 1 1 1 1   Copy design 1 1 1 1   Total score 24 25 20 24    CN II-XII intact. Bulk and tone normal, muscle strength 5/5 throughout.  Sensation to light touch, temperature and vibration intact.  Deep tendon reflexes 2+ throughout, toes downgoing.  Finger to nose and heel to shin testing intact.  Gait normal, Romberg negative.  IMPRESSION: Mild Alzheimer's dementia, stable Tobacco use  PLAN: 1.  Continue Aricept and Namenda 2.  Try to limit naps to one hour and increase physical activity (walks in the mall) 3.  Discussed smoking cessation 4.  Follow up in one year or as needed.  25 minutes spent face to face with patient, over 50% spent discussing management.  Metta Clines, DO  CC:  Lew Dawes, MD

## 2017-04-02 ENCOUNTER — Ambulatory Visit: Payer: Medicare Other

## 2017-04-05 ENCOUNTER — Telehealth: Payer: Self-pay | Admitting: *Deleted

## 2017-04-05 ENCOUNTER — Ambulatory Visit (INDEPENDENT_AMBULATORY_CARE_PROVIDER_SITE_OTHER): Payer: Medicare Other | Admitting: *Deleted

## 2017-04-05 VITALS — BP 116/72 | HR 81 | Resp 20 | Ht 62.0 in | Wt 135.0 lb

## 2017-04-05 DIAGNOSIS — E2839 Other primary ovarian failure: Secondary | ICD-10-CM

## 2017-04-05 DIAGNOSIS — Z1231 Encounter for screening mammogram for malignant neoplasm of breast: Secondary | ICD-10-CM | POA: Diagnosis not present

## 2017-04-05 DIAGNOSIS — Z0000 Encounter for general adult medical examination without abnormal findings: Secondary | ICD-10-CM

## 2017-04-05 NOTE — Telephone Encounter (Signed)
This was an error, no call was made

## 2017-04-05 NOTE — Progress Notes (Signed)
Pre visit review using our clinic review tool, if applicable. No additional management support is needed unless otherwise documented below in the visit note. 

## 2017-04-05 NOTE — Patient Instructions (Signed)
Continue doing brain stimulating activities (puzzles, reading, adult coloring books, staying active) to keep memory sharp.   Continue to eat heart healthy diet (full of fruits, vegetables, whole grains, lean protein, water--limit salt, fat, and sugar intake) and increase physical activity as tolerated.   [name] , Thank you for taking time to come for your Medicare Wellness Visit. I appreciate your ongoing commitment to your health goals. Please review the following plan we discussed and let me know if I can assist you in the future.   These are the goals we discussed: Goals    . Maintain current status          Be active, travel, enjoy God, life, family       This is a list of the screening recommended for you and due dates:  Health Maintenance  Topic Date Due  . Flu Shot  05/09/2017  . Colon Cancer Screening  11/23/2018  . Tetanus Vaccine  09/18/2026  . DEXA scan (bone density measurement)  Completed  . Pneumonia vaccines  Completed

## 2017-04-05 NOTE — Progress Notes (Addendum)
Subjective:   [name] is a 81 y.o. female who presents for Medicare Annual (Subsequent) preventive examination.  Review of Systems:  No ROS.  Medicare Wellness Visit. Additional risk factors are reflected in the social history.  Cardiac Risk Factors include: advanced age (>61men, >68 women);smoking/ tobacco exposure Sleep patterns: feels rested on waking, gets up 1 times nightly to void and sleeps 7-8 hours nightly.   Home Safety/Smoke Alarms: Feels safe in home. Smoke alarms in place.  Living environment; residence and Firearm Safety: [place], firearms stored safely. Lives with husband, no needs for DME, good support system  Seat Belt Safety/Bike Helmet: Wears seat belt.   Counseling:   Eye Exam- appointment yearly Dental- appointment every 6 months   Female:   Pap- N/A      Mammo- Last 01/02/07, referral placed today       Dexa scan- Last 11/20/02, referral placed today     CCS- Last 11/24/15, polyp, per Dr. Edison Nasuti, I will leave it to you and your primary care physician to contact my office at that time if it is felt that colon cancer screening is still an important issue for you.      Objective:     Vitals: BP 116/72   Pulse 81   Resp 20   Ht 5\' 2"  (1.575 m)   Wt 135 lb (61.2 kg)   SpO2 98%   BMI 24.69 kg/m   Body mass index is 24.69 kg/m.   Tobacco History  Smoking Status  . Current Every Day Smoker  . Packs/day: 1.50  . Years: 61.00  . Types: Cigarettes  Smokeless Tobacco  . Never Used    Comment: Sheet given on smoking     Ready to quit: Not Answered Counseling given: Not Answered   Past Medical History:  Diagnosis Date  . Anxiety   . COPD (chronic obstructive pulmonary disease) ([place])   . Dementia   . Diverticulosis of colon (without mention of hemorrhage)   . Gallstones   . GERD (gastroesophageal reflux disease)   . Hiatal hernia   . Hyperlipidemia   . Hypertension   . Hypothyroidism   . Low back pain    OA R  radiculopathy  . Lower extremity edema    chronic  . Osteopenia   . Peptic ulcer   . Peripheral vascular disease ([place])   . Personal history of colonic polyps 2001   TUBULAR ADENOMA - [name]  . Pneumonia    Past Surgical History:  Procedure Laterality Date  . ABDOMINAL ADHESION SURGERY    . ABDOMINAL HYSTERECTOMY    . APPENDECTOMY    . CARPAL TUNNEL RELEASE Bilateral   . CATARACT EXTRACTION W/ INTRAOCULAR LENS  IMPLANT, BILATERAL    . CHOLECYSTECTOMY    . NECK SURGERY     bone and plate   . ROTATOR CUFF REPAIR Bilateral   . TUBAL LIGATION     Family History  Problem Relation Age of Onset  . Dementia Mother   . Hypertension Father   . Heart disease Father   . Colon cancer Sister   . Heart disease Sister   . Esophageal cancer Neg Hx   . Rectal cancer Neg Hx   . Stomach cancer Neg Hx    History  Sexual Activity  . Sexual activity: No    Outpatient Encounter Prescriptions as of 04/05/2017  Medication Sig  . acetaminophen (TYLENOL) 325 MG tablet Take 650 mg by mouth every 6 (six) hours as  needed.  Marland Kitchen aspirin 81 MG EC tablet Take 81 mg by mouth daily.   . Cholecalciferol (VITAMIN D3) 1000 UNITS CAPS Take 1 capsule by mouth daily.    . Diclofenac Sodium (PENNSAID) 1.5 % SOLN Place 3-5 drops onto the skin 3 (three) times daily as needed. For pain applies on joints  . donepezil (ARICEPT) 10 MG tablet TAKE 1 TABLET AT BEDTIME  . furosemide (LASIX) 20 MG tablet TAKE 2 TABLETS DAILY (Patient taking differently: as needed)  . INCRUSE ELLIPTA 62.5 MCG/INH AEPB USE 1 INHALATION DAILY  . losartan (COZAAR) 50 MG tablet TAKE 1 TABLET TWICE A DAY  . memantine (NAMENDA) 10 MG tablet TAKE 1 TABLET TWICE A DAY  . Multiple Vitamin (MULTIVITAMIN) tablet Take 1 tablet by mouth daily.  Marland Kitchen omeprazole (PRILOSEC) 40 MG capsule TAKE 1 CAPSULE DAILY  . ondansetron (ZOFRAN) 4 MG tablet Take 1 tablet (4 mg total) by mouth every 8 (eight) hours as needed. Take  1 tab every 6 hours as needed for nausea  and vomiting.  . simvastatin (ZOCOR) 40 MG tablet TAKE 1 TABLET AT BEDTIME  . SYNTHROID 50 MCG tablet TAKE 1 TABLET DAILY  . [DISCONTINUED] benzonatate (TESSALON) 200 MG capsule Take 1 capsule (200 mg total) by mouth 2 (two) times daily as needed for cough. (Patient not taking: Reported on 04/05/2017)   No facility-administered encounter medications on file as of 04/05/2017.     Activities of Daily Living In your present state of health, do you have any difficulty performing the following activities: 04/05/2017  Hearing? N  Vision? N  Difficulty concentrating or making decisions? Y  Walking or climbing stairs? N  Dressing or bathing? N  Doing errands, shopping? Y  Preparing Food and eating ? N  Using the Toilet? N  In the past six months, have you accidently leaked urine? N  Do you have problems with loss of bowel control? N  Managing your Medications? Y  Managing your Finances? Y  Housekeeping or managing your Housekeeping? N  Some recent data might be hidden    Patient Care Team: Plotnikov, Evie Lacks, MD as PCP - General    Assessment:    Physical assessment deferred to PCP.  Exercise Activities and Dietary recommendations Current Exercise Habits: The patient does not participate in regular exercise at present, Exercise limited by: None identified  Diet (meal preparation, eat out, water intake, caffeinated beverages, dairy products, fruits and vegetables): in general, a "healthy" diet  , well balanced Diet education was attached to patient's AVS.   Goals    . Maintain current status          Be active, travel, enjoy God, life, family      Fall Risk Fall Risk  04/05/2017 03/15/2017 03/15/2016 03/13/2016 01/18/2015  Falls in the past year? No No No No No   Depression Screen PHQ 2/9 Scores 04/05/2017 03/13/2016 01/18/2015  PHQ - 2 Score 0 0 0     Cognitive Function MMSE - Mini Mental State Exam 04/05/2017 03/15/2017 03/15/2016 06/22/2015 06/23/2014  Not completed: Refused - - - -    Orientation to time - 4 4 2 2   Orientation to Place - 3 4 3 5   Registration - 3 3 3 3   Attention/ Calculation - 5 5 3 5   Recall - 0 0 0 0  Language- name 2 objects - 2 2 2 2   Language- repeat - 1 1 1 1   Language- follow 3 step command - 3 3 3  3  Language- read & follow direction - 1 1 1 1   Write a sentence - 1 1 1 1   Copy design - 1 1 1 1   Total score - 24 25 20 24    Montreal Cognitive Assessment  12/22/2014  Visuospatial/ Executive (0/5) 3  Naming (0/3) 2  Attention: Read list of digits (0/2) 2  Attention: Read list of letters (0/1) 1  Attention: Serial 7 subtraction starting at 100 (0/3) 3  Language: Repeat phrase (0/2) 2  Language : Fluency (0/1) 1  Abstraction (0/2) 0  Delayed Recall (0/5) 0  Orientation (0/6) 1  Total 15  Adjusted Score (based on education) 15      Immunization History  Administered Date(s) Administered  . H1N1 09/15/2008  . Influenza Split 08/21/2012  . Influenza Whole 07/29/2007, 07/08/2008, 08/03/2009, 07/27/2010  . Influenza, High Dose Seasonal PF 06/19/2016  . Influenza,inj,Quad PF,36+ Mos 06/25/2013, 07/13/2014, 08/10/2015  . Pneumococcal Conjugate-13 10/14/2014  . Pneumococcal Polysaccharide-23 08/10/2005, 10/27/2009  . Tdap 09/18/2016   Screening Tests Health Maintenance  Topic Date Due  . INFLUENZA VACCINE  05/09/2017  . COLONOSCOPY  11/23/2018  . TETANUS/TDAP  09/18/2026  . DEXA SCAN  Completed  . PNA vac Low Risk Adult  Completed      Plan:      Continue doing brain stimulating activities (puzzles, reading, adult coloring books, staying active) to keep memory sharp.   Continue to eat heart healthy diet (full of fruits, vegetables, whole grains, lean protein, water--limit salt, fat, and sugar intake) and increase physical activity as tolerated.  I have personally reviewed and noted the following in the patient's chart:   . Medical and social history . Use of alcohol, tobacco or illicit drugs  . Current medications and  supplements . Functional ability and status . Nutritional status . Physical activity . Advanced directives . List of other physicians . Vitals . Screenings to include cognitive, depression, and falls . Referrals and appointments  In addition, I have reviewed and discussed with patient certain preventive protocols, quality metrics, and best practice recommendations. A written personalized care plan for preventive services as well as general preventive health recommendations were provided to patient.     Michiel Cowboy, RN  04/05/2017   Medical screening examination/treatment/procedure(s) were performed by non-physician practitioner and as supervising physician I was immediately available for consultation/collaboration. I agree with above. [name], MD

## 2017-05-03 ENCOUNTER — Ambulatory Visit
Admission: RE | Admit: 2017-05-03 | Discharge: 2017-05-03 | Disposition: A | Discharge status: Home / Self Care | Payer: Medicare Other | Source: Ambulatory Visit | Attending: Internal Medicine | Admitting: Internal Medicine

## 2017-05-03 DIAGNOSIS — Z1231 Encounter for screening mammogram for malignant neoplasm of breast: Secondary | ICD-10-CM

## 2017-05-08 ENCOUNTER — Ambulatory Visit: Payer: Medicare Other | Admitting: Internal Medicine

## 2017-05-09 ENCOUNTER — Encounter: Payer: Self-pay | Admitting: Internal Medicine

## 2017-05-09 ENCOUNTER — Ambulatory Visit (INDEPENDENT_AMBULATORY_CARE_PROVIDER_SITE_OTHER): Payer: Medicare Other | Admitting: Internal Medicine

## 2017-05-09 ENCOUNTER — Ambulatory Visit (INDEPENDENT_AMBULATORY_CARE_PROVIDER_SITE_OTHER)
Admission: RE | Admit: 2017-05-09 | Discharge: 2017-05-09 | Disposition: A | Discharge status: Home / Self Care | Payer: Medicare Other | Source: Ambulatory Visit | Attending: Internal Medicine | Admitting: Internal Medicine

## 2017-05-09 DIAGNOSIS — J449 Chronic obstructive pulmonary disease, unspecified: Secondary | ICD-10-CM | POA: Diagnosis not present

## 2017-05-09 DIAGNOSIS — R413 Other amnesia: Secondary | ICD-10-CM

## 2017-05-09 DIAGNOSIS — R05 Cough: Secondary | ICD-10-CM | POA: Diagnosis not present

## 2017-05-09 MED ORDER — FLUTICASONE FUROATE-VILANTEROL 100-25 MCG/INH IN AEPB: 1.0000 | INHALATION_SPRAY | Freq: Every day | RESPIRATORY_TRACT | 5 refills | Status: DC

## 2017-05-09 NOTE — Assessment & Plan Note (Signed)
CXR We can try Trilegy

## 2017-05-09 NOTE — Progress Notes (Signed)
Subjective:  Patient ID: [name], female    DOB: 24-Aug-1936  Age: 81 y.o. MRN: [id number]  CC: No chief complaint on file.   HPI [name] presents for dementia, chronic leg edema, COPD f/u C/o cough - worse at night  Outpatient Medications Prior to Visit  Medication Sig Dispense Refill  . acetaminophen (TYLENOL) 325 MG tablet Take 650 mg by mouth every 6 (six) hours as needed.    Marland Kitchen aspirin 81 MG EC tablet Take 81 mg by mouth daily.     . Cholecalciferol (VITAMIN D3) 1000 UNITS CAPS Take 1 capsule by mouth daily.      . Diclofenac Sodium (PENNSAID) 1.5 % SOLN Place 3-5 drops onto the skin 3 (three) times daily as needed. For pain applies on joints    . donepezil (ARICEPT) 10 MG tablet TAKE 1 TABLET AT BEDTIME 90 tablet 3  . furosemide (LASIX) 20 MG tablet TAKE 2 TABLETS DAILY (Patient taking differently: as needed) 180 tablet 2  . INCRUSE ELLIPTA 62.5 MCG/INH AEPB USE 1 INHALATION DAILY 90 each 3  . losartan (COZAAR) 50 MG tablet TAKE 1 TABLET TWICE A DAY 180 tablet 2  . memantine (NAMENDA) 10 MG tablet TAKE 1 TABLET TWICE A DAY 180 tablet 2  . Multiple Vitamin (MULTIVITAMIN) tablet Take 1 tablet by mouth daily.    Marland Kitchen omeprazole (PRILOSEC) 40 MG capsule TAKE 1 CAPSULE DAILY 90 capsule 2  . ondansetron (ZOFRAN) 4 MG tablet Take 1 tablet (4 mg total) by mouth every 8 (eight) hours as needed. Take  1 tab every 6 hours as needed for nausea and vomiting. 60 tablet 1  . simvastatin (ZOCOR) 40 MG tablet TAKE 1 TABLET AT BEDTIME 90 tablet 2  . SYNTHROID 50 MCG tablet TAKE 1 TABLET DAILY 90 tablet 1   No facility-administered medications prior to visit.     ROS Review of Systems  Constitutional: Negative for activity change, appetite change, chills, fatigue and unexpected weight change.  HENT: Negative for congestion, mouth sores and sinus pressure.   Eyes: Negative for visual disturbance.  Respiratory: Positive for cough. Negative for chest tightness.     Gastrointestinal: Negative for abdominal pain and nausea.  Genitourinary: Negative for difficulty urinating, frequency and vaginal pain.  Musculoskeletal: Positive for arthralgias. Negative for back pain and gait problem.  Skin: Negative for pallor and rash.  Neurological: Negative for dizziness, tremors, weakness, numbness and headaches.  Psychiatric/Behavioral: Positive for confusion and decreased concentration. Negative for sleep disturbance.    Objective:  BP 128/82 (BP Location: Left Arm, Patient Position: Sitting, Cuff Size: Normal)   Pulse 68   Temp (!) 97.4 F (36.3 C) (Oral)   Ht 5\' 2"  (1.575 m)   Wt 136 lb (61.7 kg)   SpO2 96%   BMI 24.87 kg/m   BP Readings from Last 3 Encounters:  05/09/17 128/82  04/05/17 116/72  03/15/17 100/60    Wt Readings from Last 3 Encounters:  05/09/17 136 lb (61.7 kg)  04/05/17 135 lb (61.2 kg)  03/15/17 130 lb (59 kg)    Physical Exam  Constitutional: She appears well-developed. No distress.  HENT:  Head: Normocephalic.  Right Ear: External ear normal.  Left Ear: External ear normal.  Nose: Nose normal.  Mouth/Throat: Oropharynx is clear and moist.  Eyes: Pupils are equal, round, and reactive to light. Conjunctivae are normal. Right eye exhibits no discharge. Left eye exhibits no discharge.  Neck: Normal range of motion. Neck supple. No JVD  present. No tracheal deviation present. No thyromegaly present.  Cardiovascular: Normal rate, regular rhythm and normal heart sounds.   Pulmonary/Chest: No stridor. No respiratory distress. She has no wheezes.  Abdominal: Soft. Bowel sounds are normal. She exhibits no distension and no mass. There is no tenderness. There is no rebound and no guarding.  Musculoskeletal: She exhibits no edema or tenderness.  Lymphadenopathy:    She has no cervical adenopathy.  Neurological: She displays normal reflexes. No cranial nerve deficit. She exhibits normal muscle tone. Coordination normal.  Skin: No  rash noted. No erythema.  Psychiatric: She has a normal mood and affect. Her behavior is normal.  alert cooperative  Lab Results  Component Value Date   WBC 11.2 (H) 12/08/2015   HGB 15.8 (H) 12/08/2015   HCT 47.0 (H) 12/08/2015   PLT 174.0 12/08/2015   GLUCOSE 129 (H) 01/03/2017   CHOL 131 12/16/2013   TRIG 69.0 12/16/2013   HDL 46.90 12/16/2013   LDLCALC 70 12/16/2013   ALT 12 01/03/2017   AST 16 01/03/2017   NA 139 01/03/2017   K 3.8 01/03/2017   CL 103 01/03/2017   CREATININE 0.83 01/03/2017   BUN 23 01/03/2017   CO2 31 01/03/2017   TSH 1.39 06/19/2016   INR 1.15 08/10/2011   HGBA1C 5.8 10/12/2011    Mammogram Digital Screening  Result Date: 05/07/2017 CLINICAL DATA:  Screening. EXAM: DIGITAL SCREENING BILATERAL MAMMOGRAM WITH CAD COMPARISON:  Previous exam(s). ACR Breast Density Category b: There are scattered areas of fibroglandular density. FINDINGS: There are no findings suspicious for malignancy. Images were processed with CAD. IMPRESSION: No mammographic evidence of malignancy. A result letter of this screening mammogram will be mailed directly to the patient. RECOMMENDATION: Screening mammogram in one year. (Code:SM-B-01Y) BI-RADS CATEGORY  1: Negative. Electronically Signed   By: Lillia Mountain M.D.   On: 05/07/2017 16:03    Assessment & Plan:   There are no diagnoses linked to this encounter. I am having [name] maintain her aspirin, Diclofenac Sodium, Vitamin D3, ondansetron, multivitamin, INCRUSE ELLIPTA, memantine, losartan, omeprazole, SYNTHROID, donepezil, furosemide, simvastatin, and acetaminophen.  No orders of the defined types were placed in this encounter.    Follow-up: No Follow-up on file.  [name], MD

## 2017-05-09 NOTE — Assessment & Plan Note (Signed)
Losartan 

## 2017-05-09 NOTE — Assessment & Plan Note (Signed)
Alzheimers

## 2017-06-27 ENCOUNTER — Ambulatory Visit
Admission: RE | Admit: 2017-06-27 | Discharge: 2017-06-27 | Disposition: A | Discharge status: Home / Self Care | Payer: Medicare Other | Source: Ambulatory Visit | Attending: Internal Medicine | Admitting: Internal Medicine

## 2017-06-27 DIAGNOSIS — E2839 Other primary ovarian failure: Secondary | ICD-10-CM

## 2017-06-27 DIAGNOSIS — M810 Age-related osteoporosis without current pathological fracture: Secondary | ICD-10-CM | POA: Diagnosis not present

## 2017-06-27 DIAGNOSIS — Z780 Asymptomatic menopausal state: Secondary | ICD-10-CM | POA: Diagnosis not present

## 2017-07-12 ENCOUNTER — Ambulatory Visit (INDEPENDENT_AMBULATORY_CARE_PROVIDER_SITE_OTHER): Payer: Medicare Other | Admitting: *Deleted

## 2017-07-12 DIAGNOSIS — Z23 Encounter for immunization: Secondary | ICD-10-CM | POA: Diagnosis not present

## 2017-07-31 ENCOUNTER — Other Ambulatory Visit: Payer: Self-pay | Admitting: Internal Medicine

## 2017-08-15 ENCOUNTER — Ambulatory Visit (INDEPENDENT_AMBULATORY_CARE_PROVIDER_SITE_OTHER): Payer: Medicare Other | Admitting: Internal Medicine

## 2017-08-15 ENCOUNTER — Encounter: Payer: Self-pay | Admitting: Internal Medicine

## 2017-08-15 DIAGNOSIS — G301 Alzheimer's disease with late onset: Secondary | ICD-10-CM

## 2017-08-15 DIAGNOSIS — F0280 Dementia in other diseases classified elsewhere without behavioral disturbance: Secondary | ICD-10-CM | POA: Diagnosis not present

## 2017-08-15 DIAGNOSIS — M810 Age-related osteoporosis without current pathological fracture: Secondary | ICD-10-CM

## 2017-08-15 DIAGNOSIS — I739 Peripheral vascular disease, unspecified: Secondary | ICD-10-CM

## 2017-08-15 DIAGNOSIS — R634 Abnormal weight loss: Secondary | ICD-10-CM

## 2017-08-15 DIAGNOSIS — E785 Hyperlipidemia, unspecified: Secondary | ICD-10-CM

## 2017-08-15 DIAGNOSIS — I10 Essential (primary) hypertension: Secondary | ICD-10-CM | POA: Diagnosis not present

## 2017-08-15 MED ORDER — VITAMIN D3 50 MCG (2000 UT) PO CAPS: 2000.0000 [IU] | ORAL_CAPSULE | Freq: Every day | ORAL | 3 refills | Status: DC

## 2017-08-15 NOTE — Progress Notes (Signed)
Subjective:  Patient ID: [name], female    DOB: April 26, 1936  Age: 81 y.o. MRN: [id number]  CC: No chief complaint on file.   HPI [name] presents for osteoporosis, COPD, memory loss f/u  Outpatient Medications Prior to Visit  Medication Sig Dispense Refill  . acetaminophen (TYLENOL) 325 MG tablet Take 650 mg by mouth every 6 (six) hours as needed.    Marland Kitchen aspirin 81 MG EC tablet Take 81 mg by mouth daily.     . Cholecalciferol (VITAMIN D3) 2000 units capsule Take 1 capsule by mouth daily.      . Diclofenac Sodium (PENNSAID) 1.5 % SOLN Place 3-5 drops onto the skin 3 (three) times daily as needed. For pain applies on joints    . donepezil (ARICEPT) 10 MG tablet TAKE 1 TABLET AT BEDTIME 90 tablet 3  . fluticasone furoate-vilanterol (BREO ELLIPTA) 100-25 MCG/INH AEPB Inhale 1 puff into the lungs daily. 1 each 5  . furosemide (LASIX) 20 MG tablet TAKE 2 TABLETS DAILY (Patient taking differently: as needed) 180 tablet 2  . INCRUSE ELLIPTA 62.5 MCG/INH AEPB USE 1 INHALATION DAILY 90 each 3  . losartan (COZAAR) 50 MG tablet TAKE 1 TABLET TWICE A DAY 180 tablet 2  . memantine (NAMENDA) 10 MG tablet TAKE 1 TABLET TWICE A DAY 180 tablet 0  . Multiple Vitamin (MULTIVITAMIN) tablet Take 1 tablet by mouth daily.    Marland Kitchen omeprazole (PRILOSEC) 40 MG capsule TAKE 1 CAPSULE DAILY 90 capsule 2  . ondansetron (ZOFRAN) 4 MG tablet Take 1 tablet (4 mg total) by mouth every 8 (eight) hours as needed. Take  1 tab every 6 hours as needed for nausea and vomiting. 60 tablet 1  . simvastatin (ZOCOR) 40 MG tablet TAKE 1 TABLET AT BEDTIME 90 tablet 2  . SYNTHROID 50 MCG tablet TAKE 1 TABLET DAILY 90 tablet 1   No facility-administered medications prior to visit.     ROS Review of Systems  Constitutional: Negative for activity change, appetite change, chills, fatigue and unexpected weight change.  HENT: Negative for congestion, mouth sores and sinus pressure.   Eyes: Negative for visual  disturbance.  Respiratory: Negative for cough and chest tightness.   Gastrointestinal: Negative for abdominal pain and nausea.  Genitourinary: Negative for difficulty urinating, frequency and vaginal pain.  Musculoskeletal: Positive for arthralgias. Negative for back pain and gait problem.  Skin: Negative for pallor and rash.  Neurological: Negative for dizziness, tremors, weakness, numbness and headaches.  Psychiatric/Behavioral: Positive for decreased concentration. Negative for confusion and sleep disturbance. The patient is not nervous/anxious.     Objective:  BP 130/76 (BP Location: Left Arm, Patient Position: Sitting, Cuff Size: Normal)   Pulse 68   Temp 97.8 F (36.6 C) (Oral)   Ht 5\' 2"  (1.575 m)   Wt 134 lb (60.8 kg)   SpO2 98%   BMI 24.51 kg/m   BP Readings from Last 3 Encounters:  08/15/17 130/76  05/09/17 128/82  04/05/17 116/72    Wt Readings from Last 3 Encounters:  08/15/17 134 lb (60.8 kg)  05/09/17 136 lb (61.7 kg)  04/05/17 135 lb (61.2 kg)    Physical Exam  Constitutional: She appears well-developed. No distress.  HENT:  Head: Normocephalic.  Right Ear: External ear normal.  Left Ear: External ear normal.  Nose: Nose normal.  Mouth/Throat: Oropharynx is clear and moist.  Eyes: Conjunctivae are normal. Pupils are equal, round, and reactive to light. Right eye exhibits no discharge.  Left eye exhibits no discharge.  Neck: Normal range of motion. Neck supple. No JVD present. No tracheal deviation present. No thyromegaly present.  Cardiovascular: Normal rate, regular rhythm and normal heart sounds.  Pulmonary/Chest: No stridor. No respiratory distress. She has no wheezes.  Abdominal: Soft. Bowel sounds are normal. She exhibits no distension and no mass. There is no tenderness. There is no rebound and no guarding.  Musculoskeletal: She exhibits no edema or tenderness.  Lymphadenopathy:    She has no cervical adenopathy.  Neurological: She displays normal  reflexes. No cranial nerve deficit. She exhibits normal muscle tone. Coordination normal.  Skin: No rash noted. No erythema.  Psychiatric: She has a normal mood and affect. Her behavior is normal.  alert, cooperative  Lab Results  Component Value Date   WBC 11.2 (H) 12/08/2015   HGB 15.8 (H) 12/08/2015   HCT 47.0 (H) 12/08/2015   PLT 174.0 12/08/2015   GLUCOSE 129 (H) 01/03/2017   CHOL 131 12/16/2013   TRIG 69.0 12/16/2013   HDL 46.90 12/16/2013   LDLCALC 70 12/16/2013   ALT 12 01/03/2017   AST 16 01/03/2017   NA 139 01/03/2017   K 3.8 01/03/2017   CL 103 01/03/2017   CREATININE 0.83 01/03/2017   BUN 23 01/03/2017   CO2 31 01/03/2017   TSH 1.39 06/19/2016   INR 1.15 08/10/2011   HGBA1C 5.8 10/12/2011    Dexascan  Result Date: 06/27/2017 EXAM: DUAL X-RAY ABSORPTIOMETRY (DXA) FOR BONE MINERAL DENSITY IMPRESSION: Referring Physician:  Cassandria Anger PATIENT: Name: [name], [name] Patient ID: [id number] Birth Date: 25-Mar-1936 Height: 61.8 in. Sex: Female Measured: 06/27/2017 Weight: 136.3 lbs. Indications: Advanced Age, Estrogen Deficient, Hypothyroid, Hysterectomy, Postmenopausal, Synthroid, Tobacco User (Current Smoker) Fractures: None Treatments: Multivitamin ASSESSMENT: The BMD measured at Femur Total Left is 0.691 g/cm2 with a T-score of -2.5. This patient is considered osteoporotic according to [facility]) criteria. There has been a statistically significant decrease in BMD of Left hip, and no statistically significant change in BMD of Lumbar Spine since prior exam dated 11/20/2002. Site Region Measured Date Measured Age YA BMD Significant CHANGE T-score DualFemur Total Left 06/27/2017 79.5 -2.7 0.691 g/cm2 * AP Spine  L1-L4      06/27/2017    79.5         -2.3    0.912 g/cm2 World Health Organization [facility]) criteria for post-menopausal, Caucasian Women: Normal       T-score at or above -1 SD Osteopenia   T-score between -1 and -2.5 SD Osteoporosis  T-score at or below -2.5 SD RECOMMENDATION: [place] recommends that FDA-approved medical therapies be considered in postmenopausal women and men age 15 or older with a: 1. Hip or vertebral (clinical or morphometric) fracture. 2. T-score of <-2.5 at the spine or hip. 3. Ten-year fracture probability by FRAX of 3% or greater for hip fracture or 20% or greater for major osteoporotic fracture. All treatment decisions require clinical judgment and consideration of individual patient factors, including patient preferences, co-morbidities, previous drug use, risk factors not captured in the FRAX model (e.g. falls, vitamin D deficiency, increased bone turnover, interval significant decline in bone density) and possible under - or over-estimation of fracture risk by FRAX. All patients should ensure an adequate intake of dietary calcium (1200 mg/d) and vitamin D (800 IU daily) unless contraindicated. FOLLOW-UP: People with diagnosed cases of osteoporosis or at high risk for fracture should have regular bone mineral density tests. For patients eligible for Medicare, routine  testing is allowed once every 2 years. The testing frequency can be increased to one year for patients who have rapidly progressing disease, those who are receiving or discontinuing medical therapy to restore bone mass, or have additional risk factors. I have reviewed this report, and agree with the above findings. [facility] Electronically Signed   By: Rolm Baptise M.D.   On: 06/27/2017 13:58    Assessment & Plan:   There are no diagnoses linked to this encounter. I am having [name] maintain her aspirin, Diclofenac Sodium, Vitamin D3, ondansetron, multivitamin, INCRUSE ELLIPTA, losartan, omeprazole, SYNTHROID, donepezil, furosemide, simvastatin, acetaminophen, fluticasone furoate-vilanterol, and memantine.  No orders of the defined types were placed in this encounter.    Follow-up: No  Follow-up on file.  [name], MD

## 2017-08-15 NOTE — Assessment & Plan Note (Signed)
Losartan Labs 

## 2017-08-15 NOTE — Assessment & Plan Note (Addendum)
Vit D 2000 iu/d Declined other Rx,  Prolia - info given; the pt agreed to take it.Marland KitchenMarland Kitchen

## 2017-08-15 NOTE — Assessment & Plan Note (Signed)
ASA Simvastatin 

## 2017-08-15 NOTE — Assessment & Plan Note (Signed)
Labs

## 2017-08-15 NOTE — Assessment & Plan Note (Signed)
Aricept and Namenda po 

## 2017-08-15 NOTE — Assessment & Plan Note (Signed)
Wt Readings from Last 3 Encounters:  08/15/17 134 lb (60.8 kg)  05/09/17 136 lb (61.7 kg)  04/05/17 135 lb (61.2 kg)

## 2017-08-21 ENCOUNTER — Other Ambulatory Visit: Payer: Self-pay | Admitting: Internal Medicine

## 2017-08-23 ENCOUNTER — Other Ambulatory Visit: Payer: Self-pay | Admitting: Internal Medicine

## 2017-08-26 ENCOUNTER — Other Ambulatory Visit: Payer: Self-pay | Admitting: Internal Medicine

## 2017-09-28 ENCOUNTER — Other Ambulatory Visit: Payer: Self-pay | Admitting: Internal Medicine

## 2017-10-29 ENCOUNTER — Other Ambulatory Visit: Payer: Self-pay | Admitting: Internal Medicine

## 2017-11-15 ENCOUNTER — Encounter: Payer: Self-pay | Admitting: Internal Medicine

## 2017-11-15 ENCOUNTER — Ambulatory Visit (INDEPENDENT_AMBULATORY_CARE_PROVIDER_SITE_OTHER): Payer: Medicare Other | Admitting: Internal Medicine

## 2017-11-15 ENCOUNTER — Other Ambulatory Visit (INDEPENDENT_AMBULATORY_CARE_PROVIDER_SITE_OTHER): Payer: Medicare Other

## 2017-11-15 DIAGNOSIS — I10 Essential (primary) hypertension: Secondary | ICD-10-CM

## 2017-11-15 DIAGNOSIS — E785 Hyperlipidemia, unspecified: Secondary | ICD-10-CM

## 2017-11-15 DIAGNOSIS — G301 Alzheimer's disease with late onset: Secondary | ICD-10-CM

## 2017-11-15 DIAGNOSIS — R634 Abnormal weight loss: Secondary | ICD-10-CM

## 2017-11-15 DIAGNOSIS — J449 Chronic obstructive pulmonary disease, unspecified: Secondary | ICD-10-CM

## 2017-11-15 DIAGNOSIS — F0280 Dementia in other diseases classified elsewhere without behavioral disturbance: Secondary | ICD-10-CM | POA: Diagnosis not present

## 2017-11-15 DIAGNOSIS — I739 Peripheral vascular disease, unspecified: Secondary | ICD-10-CM

## 2017-11-15 LAB — CBC WITH DIFFERENTIAL/PLATELET
BASOS PCT: 1.3 % (ref 0.0–3.0)
Basophils Absolute: 0.1 10*3/uL (ref 0.0–0.1)
EOS ABS: 0.1 10*3/uL (ref 0.0–0.7)
Eosinophils Relative: 1.4 % (ref 0.0–5.0)
HEMATOCRIT: 45.9 % (ref 36.0–46.0)
HEMOGLOBIN: 15.6 g/dL — AB (ref 12.0–15.0)
LYMPHS PCT: 38.8 % (ref 12.0–46.0)
Lymphs Abs: 3.5 10*3/uL (ref 0.7–4.0)
MCHC: 34 g/dL (ref 30.0–36.0)
MCV: 87.4 fl (ref 78.0–100.0)
Monocytes Absolute: 0.6 10*3/uL (ref 0.1–1.0)
Monocytes Relative: 6.7 % (ref 3.0–12.0)
Neutro Abs: 4.7 10*3/uL (ref 1.4–7.7)
Neutrophils Relative %: 51.8 % (ref 43.0–77.0)
Platelets: 164 10*3/uL (ref 150.0–400.0)
RBC: 5.25 Mil/uL — AB (ref 3.87–5.11)
RDW: 12.9 % (ref 11.5–15.5)
WBC: 9 10*3/uL (ref 4.0–10.5)

## 2017-11-15 LAB — LIPID PANEL
Cholesterol: 155 mg/dL (ref 0–200)
HDL: 40.3 mg/dL (ref >39.00)
NONHDL: 114.27
Total CHOL/HDL Ratio: 4
Triglycerides: 206 mg/dL — ABNORMAL HIGH (ref 0.0–149.0)
VLDL: 41.2 mg/dL — ABNORMAL HIGH (ref 0.0–40.0)

## 2017-11-15 LAB — TSH: TSH: 2.63 u[IU]/mL (ref 0.35–4.50)

## 2017-11-15 LAB — BASIC METABOLIC PANEL
BUN: 15 mg/dL (ref 6–23)
CO2: 33 meq/L — AB (ref 19–32)
Calcium: 9.2 mg/dL (ref 8.4–10.5)
Chloride: 101 mEq/L (ref 96–112)
Creatinine, Ser: 1 mg/dL (ref 0.40–1.20)
GFR: 56.7 mL/min — AB (ref >60.00)
GLUCOSE: 132 mg/dL — AB (ref 70–99)
POTASSIUM: 3.7 meq/L (ref 3.5–5.1)
SODIUM: 142 meq/L (ref 135–145)

## 2017-11-15 LAB — LDL CHOLESTEROL, DIRECT: Direct LDL: 90 mg/dL

## 2017-11-15 MED ORDER — LOSARTAN POTASSIUM 50 MG PO TABS: 50.0000 mg | ORAL_TABLET | Freq: Two times a day (BID) | ORAL | 3 refills | Status: DC

## 2017-11-15 MED ORDER — OMEPRAZOLE 40 MG PO CPDR: 40.0000 mg | DELAYED_RELEASE_CAPSULE | Freq: Every day | ORAL | 3 refills | Status: DC

## 2017-11-15 MED ORDER — FLUTICASONE FUROATE-VILANTEROL 100-25 MCG/INH IN AEPB: 1.0000 | INHALATION_SPRAY | Freq: Every day | RESPIRATORY_TRACT | 3 refills | Status: DC

## 2017-11-15 MED ORDER — SYNTHROID 50 MCG PO TABS: 50.0000 ug | ORAL_TABLET | Freq: Every day | ORAL | 3 refills | Status: DC

## 2017-11-15 MED ORDER — MEMANTINE HCL 10 MG PO TABS: 10.0000 mg | ORAL_TABLET | Freq: Two times a day (BID) | ORAL | 3 refills | Status: DC

## 2017-11-15 MED ORDER — SIMVASTATIN 40 MG PO TABS: 40.0000 mg | ORAL_TABLET | Freq: Every day | ORAL | 3 refills | Status: DC

## 2017-11-15 MED ORDER — FUROSEMIDE 20 MG PO TABS: 40.0000 mg | ORAL_TABLET | Freq: Every day | ORAL | 3 refills | Status: DC

## 2017-11-15 MED ORDER — DONEPEZIL HCL 10 MG PO TABS: 10.0000 mg | ORAL_TABLET | Freq: Every day | ORAL | 3 refills | Status: DC

## 2017-11-15 NOTE — Assessment & Plan Note (Signed)
Simvastatin, ASA 

## 2017-11-15 NOTE — Assessment & Plan Note (Signed)
Wt Readings from Last 3 Encounters:  11/15/17 133 lb (60.3 kg)  08/15/17 134 lb (60.8 kg)  05/09/17 136 lb (61.7 kg)

## 2017-11-15 NOTE — Progress Notes (Signed)
Subjective:  Patient ID: [name], female    DOB: [birth year]  Age: 82 y.o. MRN: [id number]  CC: No chief complaint on file.   HPI [name] presents for dementia, COPD, GERD f/u  Outpatient Medications Prior to Visit  Medication Sig Dispense Refill  . acetaminophen (TYLENOL) 325 MG tablet Take 650 mg by mouth every 6 (six) hours as needed.    Marland Kitchen aspirin 81 MG EC tablet Take 81 mg by mouth daily.     . Cholecalciferol (VITAMIN D3) 2000 units capsule Take 1 capsule (2,000 Units total) daily by mouth. 100 capsule 3  . Diclofenac Sodium (PENNSAID) 1.5 % SOLN Place 3-5 drops onto the skin 3 (three) times daily as needed. For pain applies on joints    . donepezil (ARICEPT) 10 MG tablet TAKE 1 TABLET AT BEDTIME 90 tablet 3  . fluticasone furoate-vilanterol (BREO ELLIPTA) 100-25 MCG/INH AEPB Inhale 1 puff into the lungs daily. 1 each 5  . furosemide (LASIX) 20 MG tablet TAKE 2 TABLETS DAILY (Patient taking differently: as needed) 180 tablet 2  . INCRUSE ELLIPTA 62.5 MCG/INH AEPB USE 1 INHALATION DAILY 90 each 3  . losartan (COZAAR) 50 MG tablet TAKE 1 TABLET TWICE A DAY 180 tablet 2  . memantine (NAMENDA) 10 MG tablet TAKE 1 TABLET TWICE A DAY 180 tablet 1  . Multiple Vitamin (MULTIVITAMIN) tablet Take 1 tablet by mouth daily.    Marland Kitchen omeprazole (PRILOSEC) 40 MG capsule Take 1 capsule (40 mg total) by mouth daily. 90 capsule 1  . ondansetron (ZOFRAN) 4 MG tablet Take 1 tablet (4 mg total) by mouth every 8 (eight) hours as needed. Take  1 tab every 6 hours as needed for nausea and vomiting. 60 tablet 1  . simvastatin (ZOCOR) 40 MG tablet TAKE 1 TABLET AT BEDTIME 90 tablet 2  . SYNTHROID 50 MCG tablet TAKE 1 TABLET DAILY 90 tablet 3   No facility-administered medications prior to visit.     ROS Review of Systems  Constitutional: Negative for activity change, appetite change, chills, fatigue and unexpected weight change.  HENT: Negative for congestion, mouth sores and  sinus pressure.   Eyes: Negative for visual disturbance.  Respiratory: Negative for cough and chest tightness.   Gastrointestinal: Negative for abdominal pain and nausea.  Genitourinary: Negative for difficulty urinating, frequency and vaginal pain.  Musculoskeletal: Positive for gait problem. Negative for back pain.  Skin: Negative for pallor and rash.  Neurological: Negative for dizziness, tremors, weakness, numbness and headaches.  Psychiatric/Behavioral: Positive for confusion and decreased concentration. Negative for sleep disturbance and suicidal ideas.    Objective:  BP 122/64 (BP Location: Left Arm, Patient Position: Sitting, Cuff Size: Normal)   Pulse 85   Temp 98 F (36.7 C) (Oral)   Ht 5\' 2"  (1.575 m)   Wt 133 lb (60.3 kg)   SpO2 98%   BMI 24.33 kg/m   BP Readings from Last 3 Encounters:  11/15/17 122/64  08/15/17 130/76  05/09/17 128/82    Wt Readings from Last 3 Encounters:  11/15/17 133 lb (60.3 kg)  08/15/17 134 lb (60.8 kg)  05/09/17 136 lb (61.7 kg)    Physical Exam  Constitutional: She appears well-developed. No distress.  HENT:  Head: Normocephalic.  Right Ear: External ear normal.  Left Ear: External ear normal.  Nose: Nose normal.  Mouth/Throat: Oropharynx is clear and moist.  Eyes: Conjunctivae are normal. Pupils are equal, round, and reactive to light. Right eye exhibits no  discharge. Left eye exhibits no discharge.  Neck: Normal range of motion. Neck supple. No JVD present. No tracheal deviation present. No thyromegaly present.  Cardiovascular: Normal rate, regular rhythm and normal heart sounds.  Pulmonary/Chest: No stridor. No respiratory distress. She has no wheezes.  Abdominal: Soft. Bowel sounds are normal. She exhibits no distension and no mass. There is no tenderness. There is no rebound and no guarding.  Musculoskeletal: She exhibits no edema or tenderness.  Lymphadenopathy:    She has no cervical adenopathy.  Neurological: She  displays normal reflexes. No cranial nerve deficit. She exhibits normal muscle tone. Coordination normal.  Skin: No rash noted. No erythema.  Psychiatric: She has a normal mood and affect. Her behavior is normal. Thought content normal.  alert, cooperative  Lab Results  Component Value Date   WBC 11.2 (H) 12/08/2015   HGB 15.8 (H) 12/08/2015   HCT 47.0 (H) 12/08/2015   PLT 174.0 12/08/2015   GLUCOSE 129 (H) 01/03/2017   CHOL 131 12/16/2013   TRIG 69.0 12/16/2013   HDL 46.90 12/16/2013   LDLCALC 70 12/16/2013   ALT 12 01/03/2017   AST 16 01/03/2017   NA 139 01/03/2017   K 3.8 01/03/2017   CL 103 01/03/2017   CREATININE 0.83 01/03/2017   BUN 23 01/03/2017   CO2 31 01/03/2017   TSH 1.39 06/19/2016   INR 1.15 08/10/2011   HGBA1C 5.8 10/12/2011    Dexascan  Result Date: 06/27/2017 EXAM: DUAL X-RAY ABSORPTIOMETRY (DXA) FOR BONE MINERAL DENSITY IMPRESSION: Referring Physician:  Cassandria Anger PATIENT: Name: [name], [name] Patient ID: [id number] Birth Date: 06/08/1936 Height: 61.8 in. Sex: Female Measured: 06/27/2017 Weight: 136.3 lbs. Indications: Advanced Age, Estrogen Deficient, Hypothyroid, Hysterectomy, Postmenopausal, Synthroid, Tobacco User (Current Smoker) Fractures: None Treatments: Multivitamin ASSESSMENT: The BMD measured at Femur Total Left is 0.691 g/cm2 with a T-score of -2.5. This patient is considered osteoporotic according to [facility]) criteria. There has been a statistically significant decrease in BMD of Left hip, and no statistically significant change in BMD of Lumbar Spine since prior exam dated 11/20/2002. Site Region Measured Date Measured Age YA BMD Significant CHANGE T-score DualFemur Total Left 06[age]27/2017 79.5 -2.7 0.691 g/cm2 * AP Spine  L1-L4      8/27/2017    79.5         -2.3    0.912 g/cm2 World Health Organization [facility]) criteria for post-menopausal, Caucasian Women: Normal       T-score at or above -1 SD Osteopenia   T-score  between -1 and -2.5 SD Osteoporosis T-score at or below -2.5 SD RECOMMENDATION: [place] recommends that FDA-approved medical therapies be considered in postmenopausal women and men age 16 or older with a: 1. Hip or vertebral (clinical or morphometric) fracture. 2. T-score of <-2.5 at the spine or hip. 3. Ten-year fracture probability by FRAX of 3% or greater for hip fracture or 20% or greater for major osteoporotic fracture. All treatment decisions require clinical judgment and consideration of individual patient factors, including patient preferences, co-morbidities, previous drug use, risk factors not captured in the FRAX model (e.g. falls, vitamin D deficiency, increased bone turnover, interval significant decline in bone density) and possible under - or over-estimation of fracture risk by FRAX. All patients should ensure an adequate intake of dietary calcium (1200 mg/d) and vitamin D (800 IU daily) unless contraindicated. FOLLOW-UP: People with diagnosed cases of osteoporosis or at high risk for fracture should have regular bone mineral density tests. For patients  eligible for Medicare, routine testing is allowed once every 2 years. The testing frequency can be increased to one year for patients who have rapidly progressing disease, those who are receiving or discontinuing medical therapy to restore bone mass, or have additional risk factors. I have reviewed this report, and agree with the above findings. [facility] Electronically Signed   By: Rolm Baptise M.D.   On: 06/27/2017 13:58    Assessment & Plan:   There are no diagnoses linked to this encounter. I am having [name] maintain her aspirin, Diclofenac Sodium, ondansetron, multivitamin, donepezil, furosemide, simvastatin, acetaminophen, fluticasone furoate-vilanterol, Vitamin D3, losartan, INCRUSE ELLIPTA, SYNTHROID, omeprazole, and memantine.  No orders of the defined types were placed in this  encounter.    Follow-up: No Follow-up on file.  [name], MD

## 2017-11-15 NOTE — Assessment & Plan Note (Signed)
Aricept and Namenda po F/u Dr Tomi Likens

## 2017-11-15 NOTE — Assessment & Plan Note (Signed)
She continues to smoke. 

## 2017-11-20 ENCOUNTER — Telehealth: Payer: Self-pay | Admitting: Internal Medicine

## 2017-11-20 NOTE — Telephone Encounter (Signed)
Sound like PA will forward to assistant to proceed w/PA.Marland KitchenJohny Knapp

## 2017-11-20 NOTE — Telephone Encounter (Unsigned)
Copied from [place]. Topic: General - Other >> Nov 20, 2017  2:28 PM Neva Seat wrote: Pt's Rx is on hold.  Pt is not sure which one is on hold.  [facility] have sent a questionnaire for pt Rx to be filled.  [facility] [place], [place] [address] [place] [address] Phone: [phone] Fax: [phone]

## 2017-12-12 DIAGNOSIS — H04123 Dry eye syndrome of bilateral lacrimal glands: Secondary | ICD-10-CM | POA: Diagnosis not present

## 2017-12-12 DIAGNOSIS — H43813 Vitreous degeneration, bilateral: Secondary | ICD-10-CM | POA: Diagnosis not present

## 2017-12-13 ENCOUNTER — Telehealth: Payer: Self-pay | Admitting: Internal Medicine

## 2017-12-13 NOTE — Telephone Encounter (Signed)
Copied from [place] [id number]. Topic: Inquiry >> Dec 13, 2017  1:17 PM Cecelia Byars, NT wrote: Reason for CRM: Patients husband called and would like to know if she should still take fluticasone furoate-vilanterol (BREO ELLIPTA) 100-25 MCG/INH AEPB and wants to know if it was replaced with  INCRUSE ELLIPTA 62.5 MCG/INH AEPB  Or should she take both medications ,she has not taken the  [facility]  since being prescribed the Incruse ellipta ,and also [facility] sent them a letter recommending a different medication for the [facility] , please call him at [phone] to discuss please speak to him

## 2017-12-13 NOTE — Telephone Encounter (Signed)
Please advise 

## 2017-12-14 ENCOUNTER — Other Ambulatory Visit: Payer: Self-pay

## 2017-12-14 NOTE — Telephone Encounter (Signed)
pt's husband notified.

## 2017-12-14 NOTE — Telephone Encounter (Signed)
[name] was supposed to take Incruze Ellipta only Thx

## 2018-01-31 ENCOUNTER — Encounter: Payer: Self-pay | Admitting: Neurology

## 2018-02-21 ENCOUNTER — Ambulatory Visit (INDEPENDENT_AMBULATORY_CARE_PROVIDER_SITE_OTHER): Payer: Medicare Other | Admitting: Internal Medicine

## 2018-02-21 ENCOUNTER — Encounter: Payer: Self-pay | Admitting: Internal Medicine

## 2018-02-21 DIAGNOSIS — F0280 Dementia in other diseases classified elsewhere without behavioral disturbance: Secondary | ICD-10-CM

## 2018-02-21 DIAGNOSIS — J209 Acute bronchitis, unspecified: Secondary | ICD-10-CM | POA: Diagnosis not present

## 2018-02-21 DIAGNOSIS — J449 Chronic obstructive pulmonary disease, unspecified: Secondary | ICD-10-CM | POA: Diagnosis not present

## 2018-02-21 DIAGNOSIS — G301 Alzheimer's disease with late onset: Secondary | ICD-10-CM

## 2018-02-21 MED ORDER — METHYLPREDNISOLONE ACETATE 80 MG/ML IJ SUSP
80.0000 mg | Freq: Once | INTRAMUSCULAR | Status: AC
  Administered 2018-02-21: 80 mg via INTRAMUSCULAR

## 2018-02-21 MED ORDER — CEFUROXIME AXETIL 250 MG PO TABS: 250.0000 mg | ORAL_TABLET | Freq: Two times a day (BID) | ORAL | 0 refills | Status: AC

## 2018-02-21 NOTE — Addendum Note (Signed)
Addended by: Karren Cobble on: 02/21/2018 11:50 AM   Modules accepted: Orders

## 2018-02-21 NOTE — Assessment & Plan Note (Signed)
Aricept and Namenda po 

## 2018-02-21 NOTE — Assessment & Plan Note (Signed)
Ceftin x 7 d 

## 2018-02-21 NOTE — Progress Notes (Signed)
Subjective:  Patient ID: KALISSA GRAYS, female    DOB: 1936-02-06  Age: 82 y.o. MRN: [id number]  CC: No chief complaint on file.   HPI Ouida Abeyta Lawley presents for COPD, HTN, memory loss C/o more cough x 2 weeks   Outpatient Medications Prior to Visit  Medication Sig Dispense Refill  . acetaminophen (TYLENOL) 325 MG tablet Take 650 mg by mouth every 6 (six) hours as needed.    Marland Kitchen aspirin 81 MG EC tablet Take 81 mg by mouth daily.     . Cholecalciferol (VITAMIN D3) 2000 units capsule Take 1 capsule (2,000 Units total) daily by mouth. 100 capsule 3  . Diclofenac Sodium (PENNSAID) 1.5 % SOLN Place 3-5 drops onto the skin 3 (three) times daily as needed. For pain applies on joints    . donepezil (ARICEPT) 10 MG tablet Take 1 tablet (10 mg total) by mouth at bedtime. 90 tablet 3  . furosemide (LASIX) 20 MG tablet Take 2 tablets (40 mg total) by mouth daily. 180 tablet 3  . INCRUSE ELLIPTA 62.5 MCG/INH AEPB USE 1 INHALATION DAILY 90 each 3  . losartan (COZAAR) 50 MG tablet Take 1 tablet (50 mg total) by mouth 2 (two) times daily. 180 tablet 3  . memantine (NAMENDA) 10 MG tablet Take 1 tablet (10 mg total) by mouth 2 (two) times daily. 180 tablet 3  . Multiple Vitamin (MULTIVITAMIN) tablet Take 1 tablet by mouth daily.    Marland Kitchen omeprazole (PRILOSEC) 40 MG capsule Take 1 capsule (40 mg total) by mouth daily. 90 capsule 3  . ondansetron (ZOFRAN) 4 MG tablet Take 1 tablet (4 mg total) by mouth every 8 (eight) hours as needed. Take  1 tab every 6 hours as needed for nausea and vomiting. 60 tablet 1  . simvastatin (ZOCOR) 40 MG tablet Take 1 tablet (40 mg total) by mouth at bedtime. 90 tablet 3  . SYNTHROID 50 MCG tablet Take 1 tablet (50 mcg total) by mouth daily. 90 tablet 3   No facility-administered medications prior to visit.     ROS Review of Systems  Constitutional: Positive for fatigue. Negative for activity change, appetite change, chills and unexpected weight change.    HENT: Negative for congestion, mouth sores and sinus pressure.   Eyes: Negative for visual disturbance.  Respiratory: Positive for cough. Negative for chest tightness, shortness of breath and wheezing.   Gastrointestinal: Negative for abdominal pain and nausea.  Genitourinary: Negative for difficulty urinating, frequency and vaginal pain.  Musculoskeletal: Negative for back pain and gait problem.  Skin: Negative for pallor and rash.  Neurological: Negative for dizziness, tremors, weakness, numbness and headaches.  Psychiatric/Behavioral: Positive for decreased concentration. Negative for confusion, sleep disturbance and suicidal ideas.    Objective:  BP 120/72 (BP Location: Left Arm, Patient Position: Sitting, Cuff Size: Normal)   Pulse 90   Temp (!) 97.4 F (36.3 C) (Oral)   Ht 5\' 2"  (1.575 m)   Wt 132 lb (59.9 kg)   SpO2 96%   BMI 24.14 kg/m   BP Readings from Last 3 Encounters:  02/21/18 120/72  11/15/17 122/64  08/15/17 130/76    Wt Readings from Last 3 Encounters:  02/21/18 132 lb (59.9 kg)  11/15/17 133 lb (60.3 kg)  08/15/17 134 lb (60.8 kg)    Physical Exam  Constitutional: She appears well-developed. No distress.  HENT:  Head: Normocephalic.  Right Ear: External ear normal.  Left Ear: External ear normal.  Nose: Nose normal.  Mouth/Throat: Oropharynx is clear and moist.  Eyes: Pupils are equal, round, and reactive to light. Conjunctivae are normal. Right eye exhibits no discharge. Left eye exhibits no discharge.  Neck: Normal range of motion. Neck supple. No JVD present. No tracheal deviation present. No thyromegaly present.  Cardiovascular: Normal rate, regular rhythm and normal heart sounds.  Pulmonary/Chest: No stridor. No respiratory distress. She has no wheezes.  Abdominal: Soft. Bowel sounds are normal. She exhibits no distension and no mass. There is no tenderness. There is no rebound and no guarding.  Musculoskeletal: She exhibits no edema or  tenderness.  Lymphadenopathy:    She has no cervical adenopathy.  Neurological: She displays normal reflexes. No cranial nerve deficit. She exhibits normal muscle tone. Coordination normal.  Skin: No rash noted. No erythema.  Psychiatric: She has a normal mood and affect. Her behavior is normal. Judgment and thought content normal.   Trace edema B forgetful   Lab Results  Component Value Date   WBC 9.0 11/15/2017   HGB 15.6 (H) 11/15/2017   HCT 45.9 11/15/2017   PLT 164.0 11/15/2017   GLUCOSE 132 (H) 11/15/2017   CHOL 155 11/15/2017   TRIG 206.0 (H) 11/15/2017   HDL 40.30 11/15/2017   LDLDIRECT 90.0 11/15/2017   LDLCALC 70 12/16/2013   ALT 12 01/03/2017   AST 16 01/03/2017   NA 142 11/15/2017   K 3.7 11/15/2017   CL 101 11/15/2017   CREATININE 1.00 11/15/2017   BUN 15 11/15/2017   CO2 33 (H) 11/15/2017   TSH 2.63 11/15/2017   INR 1.15 08/10/2011   HGBA1C 5.8 10/12/2011    Dexascan  Result Date: 06/27/2017 EXAM: DUAL X-RAY ABSORPTIOMETRY (DXA) FOR BONE MINERAL DENSITY IMPRESSION: Referring Physician:  Cassandria Anger PATIENT: Name: ROY, SNUFFER Patient ID: [id number] Birth Date: Feb 26, 1936 Height: 61.8 in. Sex: Female Measured: 06/27/2017 Weight: 136.3 lbs. Indications: Advanced Age, Estrogen Deficient, Hypothyroid, Hysterectomy, Postmenopausal, Synthroid, Tobacco User (Current Smoker) Fractures: None Treatments: Multivitamin ASSESSMENT: The BMD measured at Femur Total Left is 0.691 g/cm2 with a T-score of -2.5. This patient is considered osteoporotic according to [facility]) criteria. There has been a statistically significant decrease in BMD of Left hip, and no statistically significant change in BMD of Lumbar Spine since prior exam dated 11/20/2002. Site Region Measured Date Measured Age YA BMD Significant CHANGE T-score DualFemur Total Left 8/27/2017 81.5 -2.5 0.691 g/cm2 * AP Spine  L1-L4      8/27/2017    79.5         -2.3    0.912 g/cm2  World Health Organization [facility]) criteria for post-menopausal, Caucasian Women: Normal       T-score at or above -1 SD Osteopenia   T-score between -1 and -2.5 SD Osteoporosis T-score at or below -2.5 SD RECOMMENDATION: [place] recommends that FDA-approved medical therapies be considered in postmenopausal women and men age 52 or older with a: 1. Hip or vertebral (clinical or morphometric) fracture. 2. T-score of <-2.5 at the spine or hip. 3. Ten-year fracture probability by FRAX of 3% or greater for hip fracture or 20% or greater for major osteoporotic fracture. All treatment decisions require clinical judgment and consideration of individual patient factors, including patient preferences, co-morbidities, previous drug use, risk factors not captured in the FRAX model (e.g. falls, vitamin D deficiency, increased bone turnover, interval significant decline in bone density) and possible under - or over-estimation of fracture risk by FRAX. All patients should ensure an adequate intake of  dietary calcium (1200 mg/d) and vitamin D (800 IU daily) unless contraindicated. FOLLOW-UP: People with diagnosed cases of osteoporosis or at high risk for fracture should have regular bone mineral density tests. For patients eligible for Medicare, routine testing is allowed once every 2 years. The testing frequency can be increased to one year for patients who have rapidly progressing disease, those who are receiving or discontinuing medical therapy to restore bone mass, or have additional risk factors. I have reviewed this report, and agree with the above findings. [facility] Radiology Electronically Signed   By: Rolm Baptise M.D.   On: 06/27/2017 13:58    Assessment & Plan:   There are no diagnoses linked to this encounter. I am having Anson Fret. [place] maintain her aspirin, Diclofenac Sodium, ondansetron, multivitamin, acetaminophen, Vitamin D3, INCRUSE ELLIPTA, donepezil, furosemide, losartan,  memantine, omeprazole, simvastatin, and SYNTHROID.  No orders of the defined types were placed in this encounter.    Follow-up: No follow-ups on file.  [name], MD

## 2018-02-21 NOTE — Assessment & Plan Note (Signed)
Worse Depo-medrol CXR if not better in 2 wks

## 2018-02-27 DIAGNOSIS — H04123 Dry eye syndrome of bilateral lacrimal glands: Secondary | ICD-10-CM | POA: Diagnosis not present

## 2018-03-13 ENCOUNTER — Ambulatory Visit (INDEPENDENT_AMBULATORY_CARE_PROVIDER_SITE_OTHER): Payer: Medicare Other

## 2018-03-13 DIAGNOSIS — M810 Age-related osteoporosis without current pathological fracture: Secondary | ICD-10-CM | POA: Diagnosis not present

## 2018-03-13 MED ORDER — DENOSUMAB 60 MG/ML ~~LOC~~ SOSY
60.0000 mg | PREFILLED_SYRINGE | Freq: Once | SUBCUTANEOUS | Status: AC
  Administered 2018-03-13: 60 mg via SUBCUTANEOUS

## 2018-03-18 ENCOUNTER — Ambulatory Visit: Payer: Medicare Other | Admitting: Neurology

## 2018-04-01 ENCOUNTER — Ambulatory Visit: Payer: Medicare Other | Admitting: Neurology

## 2018-04-09 ENCOUNTER — Ambulatory Visit: Payer: Medicare Other

## 2018-04-16 ENCOUNTER — Ambulatory Visit (INDEPENDENT_AMBULATORY_CARE_PROVIDER_SITE_OTHER): Payer: Medicare Other | Admitting: Neurology

## 2018-04-16 ENCOUNTER — Encounter: Payer: Self-pay | Admitting: Neurology

## 2018-04-16 VITALS — BP 124/76 | HR 77 | Ht 61.5 in | Wt 131.0 lb

## 2018-04-16 DIAGNOSIS — F17210 Nicotine dependence, cigarettes, uncomplicated: Secondary | ICD-10-CM | POA: Diagnosis not present

## 2018-04-16 DIAGNOSIS — F17200 Nicotine dependence, unspecified, uncomplicated: Secondary | ICD-10-CM

## 2018-04-16 DIAGNOSIS — F0280 Dementia in other diseases classified elsewhere without behavioral disturbance: Secondary | ICD-10-CM | POA: Diagnosis not present

## 2018-04-16 DIAGNOSIS — G301 Alzheimer's disease with late onset: Secondary | ICD-10-CM | POA: Diagnosis not present

## 2018-04-16 NOTE — Patient Instructions (Signed)
1.  Continue donepezil 10mg  at bedtime and memantine 10mg  twice daily 2.  Continue remaining active:  Stay busy at home (such as housework).  Continue going out to restaurants and church 3.  Try to quit smoking if possible 4.  Follow up in one year

## 2018-04-16 NOTE — Progress Notes (Addendum)
NEUROLOGY FOLLOW UP OFFICE NOTE  [name] [id number]  HISTORY OF PRESENT ILLNESS: [name] is an 82 year old right-handed female with GERD, peripheral vascular disease, hyperlipidemia, COPD, hypertension, hypothyroidism, and anxiety who follows up for Alzheimer's disease.  She is accompanied by her husband who supplements history.  UPDATE: She is taking Aricept 10mg  daily and Namenda 10mg  twice daily. Memory loss has slightly progressed.  She has trouble with some names.  She is able to dress, bathe and use the toilet herself.  She keeps busy performing chores around the house, such as cleaning.  She does not cook or drive.  She goes out with her husband often to eat.  She does not get agitated or combative.  She is not hallucinating or delusional.  She sleeps well.  She naps during the day. She denies depression.   HISTORY: Her husband began to notice changes in memory around 2012 or 2013. She would have problems with short-term memory. She would forget about phone calls. She would repeat questions. Her husband usually goes to the grocery store. She has no difficulty remembering names or faces. She has no hallucinations or delusions. She sleeps well. She is not depressed. There has been no change in personality or behavior. She is able to perform all her ADLs. She has gotten lost while driving. During the day, she tends to the house but spends time watching TV. She reads the Bible but not everyday. She and her husband are active in the church. They go to choir practice every Wednesday night. They participate in a senior group once a month. Her mother and maternal aunt had dementia.  She has history of nausea and mild anorexia. No vomiting. She had been on both Aricept and rivastigmine at one time and were discontinued with concern that it was causing the nausea. However, the nausea never resolved. She has been evaluated by GI with no clear  etiology. She was subsequently restarted on Aricept and then Namenda.  11/05/13 CT HEAD: no bleed, mass lesions or acute infarcts seen. Mild atrophy and atherosclerotic changes noted.  PAST MEDICAL HISTORY: Past Medical History:  Diagnosis Date  . Anxiety   . COPD (chronic obstructive pulmonary disease) ([place])   . Dementia   . Diverticulosis of colon (without mention of hemorrhage)   . Gallstones   . GERD (gastroesophageal reflux disease)   . Hiatal hernia   . Hyperlipidemia   . Hypertension   . Hypothyroidism   . Low back pain    OA R radiculopathy  . Lower extremity edema    chronic  . Osteopenia   . Peptic ulcer   . Peripheral vascular disease ([place])   . Personal history of colonic polyps 2001   TUBULAR ADENOMA - [name]  . Pneumonia     MEDICATIONS: Current Outpatient Medications on File Prior to Visit  Medication Sig Dispense Refill  . acetaminophen (TYLENOL) 325 MG tablet Take 650 mg by mouth every 6 (six) hours as needed.    Marland Kitchen aspirin 81 MG EC tablet Take 81 mg by mouth daily.     . Cholecalciferol (VITAMIN D3) 2000 units capsule Take 1 capsule (2,000 Units total) daily by mouth. 100 capsule 3  . Diclofenac Sodium (PENNSAID) 1.5 % SOLN Place 3-5 drops onto the skin 3 (three) times daily as needed. For pain applies on joints    . donepezil (ARICEPT) 10 MG tablet Take 1 tablet (10 mg total) by mouth at bedtime. 90 tablet 3  .  furosemide (LASIX) 20 MG tablet Take 2 tablets (40 mg total) by mouth daily. 180 tablet 3  . INCRUSE ELLIPTA 62.5 MCG/INH AEPB USE 1 INHALATION DAILY 90 each 3  . losartan (COZAAR) 50 MG tablet Take 1 tablet (50 mg total) by mouth 2 (two) times daily. 180 tablet 3  . memantine (NAMENDA) 10 MG tablet Take 1 tablet (10 mg total) by mouth 2 (two) times daily. 180 tablet 3  . Multiple Vitamin (MULTIVITAMIN) tablet Take 1 tablet by mouth daily.    Marland Kitchen omeprazole (PRILOSEC) 40 MG capsule Take 1 capsule (40 mg total) by mouth daily. 90 capsule 3  .  ondansetron (ZOFRAN) 4 MG tablet Take 1 tablet (4 mg total) by mouth every 8 (eight) hours as needed. Take  1 tab every 6 hours as needed for nausea and vomiting. 60 tablet 1  . simvastatin (ZOCOR) 40 MG tablet Take 1 tablet (40 mg total) by mouth at bedtime. 90 tablet 3  . SYNTHROID 50 MCG tablet Take 1 tablet (50 mcg total) by mouth daily. 90 tablet 3   No current facility-administered medications on file prior to visit.     ALLERGIES: Allergies  Allergen Reactions  . Codeine Sulfate Other (See Comments)    Extreme headaches  . Exelon [Rivastigmine Tartrate]     nausea  . Oxycodone-Acetaminophen Other (See Comments)    Extreme headaches    FAMILY HISTORY: Family History  Problem Relation Age of Onset  . Dementia Mother   . Hypertension Father   . Heart disease Father   . Colon cancer Sister   . Heart disease Sister   . Esophageal cancer Neg Hx   . Rectal cancer Neg Hx   . Stomach cancer Neg Hx     SOCIAL HISTORY: Social History   Socioeconomic History  . Marital status: Married    Spouse name: Not on file  . Number of children: 4  . Years of education: Not on file  . Highest education level: Not on file  Occupational History  . Occupation: retired  Scientific laboratory technician  . Financial resource strain: Not on file  . Food insecurity:    Worry: Not on file    Inability: Not on file  . Transportation needs:    Medical: Not on file    Non-medical: Not on file  Tobacco Use  . Smoking status: Current Every Day Smoker    Packs/day: 1.50    Years: 61.00    Pack years: 91.50    Types: Cigarettes  . Smokeless tobacco: Never Used  . Tobacco comment: Sheet given on smoking  Substance and Sexual Activity  . Alcohol use: No    Alcohol/week: 0.0 oz  . Drug use: No  . Sexual activity: Never    Partners: Male  Lifestyle  . Physical activity:    Days per week: Not on file    Minutes per session: Not on file  . Stress: Not on file  Relationships  . Social connections:     Talks on phone: Not on file    Gets together: Not on file    Attends religious service: Not on file    Active member of club or organization: Not on file    Attends meetings of clubs or organizations: Not on file    Relationship status: Not on file  . Intimate partner violence:    Fear of current or ex partner: Not on file    Emotionally abused: Not on file    Physically  abused: Not on file    Forced sexual activity: Not on file  Other Topics Concern  . Not on file  Social History Narrative  . Not on file    REVIEW OF SYSTEMS: Constitutional: No fevers, chills, or sweats, no generalized fatigue, change in appetite Eyes: No visual changes, double vision, eye pain Ear, nose and throat: No hearing loss, ear pain, nasal congestion, sore throat Cardiovascular: No chest pain, palpitations Respiratory:  No shortness of breath at rest or with exertion, wheezes GastrointestinaI: No nausea, vomiting, diarrhea, abdominal pain, fecal incontinence Genitourinary:  No dysuria, urinary retention or frequency Musculoskeletal:  No neck pain, back pain Integumentary: No rash, pruritus, skin lesions Neurological: as above Psychiatric: No depression, insomnia, anxiety Endocrine: No palpitations, fatigue, diaphoresis, mood swings, change in appetite, change in weight, increased thirst Hematologic/Lymphatic:  No purpura, petechiae. Allergic/Immunologic: no itchy/runny eyes, nasal congestion, recent allergic reactions, rashes  PHYSICAL EXAM: BP 124/76, Pulse 77, SpO2 93% General: No acute distress.  Patient appears well-groomed. Head:  Normocephalic/atraumatic Eyes:  Fundi examined but not visualized Neck: supple, no paraspinal tenderness, full range of motion Heart:  Regular rate and rhythm Lungs:  Clear to auscultation bilaterally Back: No paraspinal tenderness Neurological Exam: alert and oriented to person and place but not time. Attention span and concentration intact, recent memory poor,  remote memory intact, fund of knowledge intact.  Speech fluent and not dysarthric, language intact.   MMSE - Mini Mental State Exam 04/16/2018 04/05/2017 03/15/2017 03/15/2016 06/22/2015 06/23/2014  Not completed: - Refused - - - -  Orientation to time 0 - 4 4 2 2   Orientation to Place 4 - 3 4 3 5   Registration 3 - 3 3 3 3   Attention/ Calculation 3 - 5 5 3 5   Recall 0 - 0 0 0 0  Language- name 2 objects 2 - 2 2 2 2   Language- repeat 1 - 1 1 1 1   Language- follow 3 step command 3 - 3 3 3 3   Language- read & follow direction 1 - 1 1 1 1   Write a sentence 1 - 1 1 1 1   Copy design 1 - 1 1 1 1   Total score 19 - 24 25 20 24    CN II-XII intact. Bulk and tone normal, muscle strength 5/5 throughout.  Sensation to light touch  intact.  Deep tendon reflexes 2+ throughout.  Finger to nose testing intact.  Gait normal, Romberg negative  IMPRESSION: 1.  Alzheimer's disease.  Clinically she is stable.  MMSE score dropped this year, however she has had fluctuations in score over the years. 2.  Cigarette smoker  PLAN: 1.  Continue donepezil 10mg  at bedtime and memantine 10mg  twice daily 2.  Continue remaining active:  Stay busy at home (such as housework).  Continue going out to restaurants and church 3.  Tobacco cessation counseling (CPT 99406):  Tobacco with no history of CAD, stroke, or cancer  - Currently smoking 1.5 packs/day   - Patient was informed of the dangers of tobacco abuse including stroke, cancer, and MI, as well as benefits of tobacco cessation. - Patient is not willing to quit at this time. - Approximately 5 mins were spent counseling patient cessation techniques. We discussed various methods to help quit smoking, including deciding on a date to quit, joining a support group, pharmacological agents- nicotine gum/patch/lozenges, chantix.  - I will reassess her progress at the next follow-up visit 4.  Follow up in one year  25 minutes spent  face to face with patient, over 50% spent  discussing management.  Metta Clines, DO  CC:  Dr. Alain Marion

## 2018-04-17 ENCOUNTER — Ambulatory Visit (INDEPENDENT_AMBULATORY_CARE_PROVIDER_SITE_OTHER): Payer: Medicare Other | Admitting: *Deleted

## 2018-04-17 VITALS — BP 106/58 | HR 80 | Resp 18 | Ht 62.0 in | Wt 132.0 lb

## 2018-04-17 DIAGNOSIS — Z0000 Encounter for general adult medical examination without abnormal findings: Secondary | ICD-10-CM

## 2018-04-17 NOTE — Progress Notes (Addendum)
Subjective:   [name] is a 82 y.o. female who presents for Medicare Annual (Subsequent) preventive examination.  Review of Systems:  No ROS.  Medicare Wellness Visit. Additional risk factors are reflected in the social history.  Cardiac Risk Factors include: advanced age (>14men, >68 women);dyslipidemia;hypertension Sleep patterns: feels rested on waking, gets up 1 times nightly to void and sleeps 8 hours nightly.    Home Safety/Smoke Alarms: Feels safe in home. Smoke alarms in place.  Living environment; residence and Firearm Safety: 2-story house, no firearms. Lives with husband, no needs for DME, good support system Seat Belt Safety/Bike Helmet: Wears seat belt.      Objective:     Vitals: BP (!) 106/58   Pulse 80   Resp 18   Ht 5\' 2"  (1.575 m)   Wt 132 lb (59.9 kg)   SpO2 96%   BMI 24.14 kg/m   Body mass index is 24.14 kg/m.  Advanced Directives 04/17/2018 04/05/2017 09/15/2015 07/29/2015 06/22/2015 02/21/2014  Does Patient Have a Medical Advance Directive? Yes Yes Yes Yes Yes Patient has advance directive, copy not in chart  Type of Advance Directive [name];Living will [name];Living will [name];Living will [name];Living will Living will;[name];Living will  Does patient want to make changes to medical advance directive? - - No - Patient declined - - -  Copy of [place] in Chart? No - copy requested No - copy requested No - copy requested No - copy requested - Copy requested from family  Pre-existing out of facility DNR order (yellow form or pink MOST form) - - - - - No    Tobacco Social History   Tobacco Use  Smoking Status Current Every Day Smoker  . Packs/day: 1.50  . Years: 61.00  . Pack years: 91.50  . Types: Cigarettes  Smokeless Tobacco Never Used  Tobacco Comment   Sheet given on smoking       Ready to quit: Not Answered Counseling given: Not Answered Comment: Sheet given on smoking  Past Medical History:  Diagnosis Date  . Anxiety   . COPD (chronic obstructive pulmonary disease) ([place])   . Dementia   . Diverticulosis of colon (without mention of hemorrhage)   . Gallstones   . GERD (gastroesophageal reflux disease)   . Hiatal hernia   . Hyperlipidemia   . Hypertension   . Hypothyroidism   . Low back pain    OA R radiculopathy  . Lower extremity edema    chronic  . Osteopenia   . Peptic ulcer   . Peripheral vascular disease ([place])   . Personal history of colonic polyps 2001   TUBULAR ADENOMA - [name]  . Pneumonia    Past Surgical History:  Procedure Laterality Date  . ABDOMINAL ADHESION SURGERY    . ABDOMINAL HYSTERECTOMY    . APPENDECTOMY    . CARPAL TUNNEL RELEASE Bilateral   . CATARACT EXTRACTION W/ INTRAOCULAR LENS  IMPLANT, BILATERAL    . CHOLECYSTECTOMY    . NECK SURGERY     bone and plate   . ROTATOR CUFF REPAIR Bilateral   . TUBAL LIGATION     Family History  Problem Relation Age of Onset  . Dementia Mother   . Hypertension Father   . Heart disease Father   . Colon cancer Sister   . Heart disease Sister   . Esophageal cancer Neg  Hx   . Rectal cancer Neg Hx   . Stomach cancer Neg Hx    Social History   Socioeconomic History  . Marital status: Married    Spouse name: Not on file  . Number of children: 4  . Years of education: Not on file  . Highest education level: Not on file  Occupational History  . Occupation: retired  Scientific laboratory technician  . Financial resource strain: Not hard at all  . Food insecurity:    Worry: Never true    Inability: Never true  . Transportation needs:    Medical: No    Non-medical: No  Tobacco Use  . Smoking status: Current Every Day Smoker    Packs/day: 1.50    Years: 61.00    Pack years: 91.50    Types: Cigarettes  . Smokeless tobacco: Never Used  . Tobacco comment: Sheet given on smoking  Substance  and Sexual Activity  . Alcohol use: No    Alcohol/week: 0.0 oz  . Drug use: No  . Sexual activity: Never    Partners: Male  Lifestyle  . Physical activity:    Days per week: 0 days    Minutes per session: 0 min  . Stress: Not at all  Relationships  . Social connections:    Talks on phone: More than three times a week    Gets together: More than three times a week    Attends religious service: More than 4 times per year    Active member of club or organization: Yes    Attends meetings of clubs or organizations: More than 4 times per year    Relationship status: Married  Other Topics Concern  . Not on file  Social History Narrative  . Not on file    Outpatient Encounter Medications as of 04/17/2018  Medication Sig  . acetaminophen (TYLENOL) 325 MG tablet Take 650 mg by mouth every 6 (six) hours as needed.  Marland Kitchen aspirin 81 MG EC tablet Take 81 mg by mouth daily.   . Cholecalciferol (VITAMIN D3) 2000 units capsule Take 1 capsule (2,000 Units total) daily by mouth.  . Diclofenac Sodium (PENNSAID) 1.5 % SOLN Place 3-5 drops onto the skin 3 (three) times daily as needed. For pain applies on joints  . donepezil (ARICEPT) 10 MG tablet Take 1 tablet (10 mg total) by mouth at bedtime.  . furosemide (LASIX) 20 MG tablet Take 2 tablets (40 mg total) by mouth daily.  . INCRUSE ELLIPTA 62.5 MCG/INH AEPB USE 1 INHALATION DAILY  . losartan (COZAAR) 50 MG tablet Take 1 tablet (50 mg total) by mouth 2 (two) times daily.  . memantine (NAMENDA) 10 MG tablet Take 1 tablet (10 mg total) by mouth 2 (two) times daily.  . Multiple Vitamin (MULTIVITAMIN) tablet Take 1 tablet by mouth daily.  Marland Kitchen omeprazole (PRILOSEC) 40 MG capsule Take 1 capsule (40 mg total) by mouth daily.  . ondansetron (ZOFRAN) 4 MG tablet Take 1 tablet (4 mg total) by mouth every 8 (eight) hours as needed. Take  1 tab every 6 hours as needed for nausea and vomiting.  . simvastatin (ZOCOR) 40 MG tablet Take 1 tablet (40 mg total) by mouth  at bedtime.  Marland Kitchen SYNTHROID 50 MCG tablet Take 1 tablet (50 mcg total) by mouth daily.   No facility-administered encounter medications on file as of 04/17/2018.     Activities of Daily Living In your present state of health, do you have any difficulty performing the following  activities: 04/17/2018  Hearing? N  Vision? N  Difficulty concentrating or making decisions? Y  Walking or climbing stairs? N  Dressing or bathing? N  Doing errands, shopping? Y  Preparing Food and eating ? N  Using the Toilet? N  In the past six months, have you accidently leaked urine? N  Do you have problems with loss of bowel control? N  Managing your Medications? Y  Managing your Finances? Y  Housekeeping or managing your Housekeeping? N  Some recent data might be hidden    Patient Care Team: Plotnikov, Evie Lacks, MD as PCP - Almond Lint, DO as Consulting Physician (Neurology) Milus Banister, MD as Attending Physician (Gastroenterology)    Assessment:   This is a routine wellness examination for Aneta. Physical assessment deferred to PCP.   Exercise Activities and Dietary recommendations Current Exercise Habits: The patient does not participate in regular exercise at present(Stays active by doing house and yard work)  Diet (meal preparation, eat out, water intake, caffeinated beverages, dairy products, fruits and vegetables): in general, a "healthy" diet  , well balanced, eats a variety of fruits and vegetables daily, limits salt, fat/cholesterol, sugar,carbohydrates,caffeine, drinks 4 glasses of water daily.  Encouraged patient to increase daily water and fluid intake.   Goals    . Patient Stated     Continue to travel, enjoy life, family and friends.       Fall Risk Fall Risk  04/17/2018 04/16/2018 04/05/2017 03/15/2017 03/15/2016  Falls in the past year? No No No No No  Depression Screen PHQ 2/9 Scores 04/17/2018 04/05/2017 03/13/2016 01/18/2015  PHQ - 2 Score 0 0 0 0     Cognitive  Function MMSE - Mini Mental State Exam 04/17/2018 04/16/2018 04/05/2017 03/15/2017 03/15/2016  Not completed: (No Data) - Refused - -  Orientation to time - 0 - 4 4  Orientation to Place - 4 - 3 4  Registration - 3 - 3 3  Attention/ Calculation - 3 - 5 5  Recall - 0 - 0 0  Language- name 2 objects - 2 - 2 2  Language- repeat - 1 - 1 1  Language- follow 3 step command - 3 - 3 3  Language- read & follow direction - 1 - 1 1  Write a sentence - 1 - 1 1  Copy design - 1 - 1 1  Total score - 19 - 24 25   Montreal Cognitive Assessment  12/22/2014  Visuospatial/ Executive (0/5) 3  Naming (0/3) 2  Attention: Read list of digits (0/2) 2  Attention: Read list of letters (0/1) 1  Attention: Serial 7 subtraction starting at 100 (0/3) 3  Language: Repeat phrase (0/2) 2  Language : Fluency (0/1) 1  Abstraction (0/2) 0  Delayed Recall (0/5) 0  Orientation (0/6) 1  Total 15  Adjusted Score (based on education) 15      Immunization History  Administered Date(s) Administered  . H1N1 09/15/2008  . Influenza Split 08/21/2012  . Influenza Whole 07/29/2007, 07/08/2008, 08/03/2009, 07/27/2010  . Influenza, High Dose Seasonal PF 06/19/2016, 07/12/2017  . Influenza,inj,Quad PF,6+ Mos 06/25/2013, 07/13/2014, 08/10/2015  . Pneumococcal Conjugate-13 10/14/2014  . Pneumococcal Polysaccharide-23 08/10/2005, 10/27/2009  . Tdap 09/18/2016   Screening Tests Health Maintenance  Topic Date Due  . INFLUENZA VACCINE  05/09/2018  . COLONOSCOPY  11/23/2018  . TETANUS/TDAP  09/18/2026  . DEXA SCAN  Completed  . PNA vac Low Risk Adult  Completed  Plan:     Continue doing brain stimulating activities (puzzles, reading, adult coloring books, staying active) to keep memory sharp.   Continue to eat heart healthy diet (full of fruits, vegetables, whole grains, lean protein, water--limit salt, fat, and sugar intake) and increase physical activity as tolerated.  I have personally reviewed and noted the  following in the patient's chart:   . Medical and social history . Use of alcohol, tobacco or illicit drugs  . Current medications and supplements . Functional ability and status . Nutritional status . Physical activity . Advanced directives . List of other physicians . Vitals . Screenings to include cognitive, depression, and falls . Referrals and appointments  In addition, I have reviewed and discussed with patient certain preventive protocols, quality metrics, and best practice recommendations. A written personalized care plan for preventive services as well as general preventive health recommendations were provided to patient.     Michiel Cowboy, RN  04/17/2018  Medical screening examination/treatment/procedure(s) were performed by non-physician practitioner and as supervising physician I was immediately available for consultation/collaboration. I agree with above. Lew Dawes, MD

## 2018-04-17 NOTE — Patient Instructions (Signed)
Continue doing brain stimulating activities (puzzles, reading, adult coloring books, staying active) to keep memory sharp.   Continue to eat heart healthy diet (full of fruits, vegetables, whole grains, lean protein, water--limit salt, fat, and sugar intake) and increase physical activity as tolerated.   Ms. [name] , Thank you for taking time to come for your Medicare Wellness Visit. I appreciate your ongoing commitment to your health goals. Please review the following plan we discussed and let me know if I can assist you in the future.   These are the goals we discussed: Goals    . Patient Stated     Continue to travel, enjoy life, family and friends.       This is a list of the screening recommended for you and due dates:  Health Maintenance  Topic Date Due  . Flu Shot  05/09/2018  . Colon Cancer Screening  11/23/2018  . Tetanus Vaccine  09/18/2026  . DEXA scan (bone density measurement)  Completed  . Pneumonia vaccines  Completed   Health Maintenance, Female Adopting a healthy lifestyle and getting preventive care can go a long way to promote health and wellness. Talk with your health care provider about what schedule of regular examinations is right for you. This is a good chance for you to check in with your provider about disease prevention and staying healthy. In between checkups, there are plenty of things you can do on your own. Experts have done a lot of research about which lifestyle changes and preventive measures are most likely to keep you healthy. Ask your health care provider for more information. Weight and diet Eat a healthy diet  Be sure to include plenty of vegetables, fruits, low-fat dairy products, and lean protein.  Do not eat a lot of foods high in solid fats, added sugars, or salt.  Get regular exercise. This is one of the most important things you can do for your health. ? Most adults should exercise for at least 150 minutes each week. The exercise  should increase your heart rate and make you sweat (moderate-intensity exercise). ? Most adults should also do strengthening exercises at least twice a week. This is in addition to the moderate-intensity exercise.  Maintain a healthy weight  Body mass index (BMI) is a measurement that can be used to identify possible weight problems. It estimates body fat based on height and weight. Your health care provider can help determine your BMI and help you achieve or maintain a healthy weight.  For females 22 years of age and older: ? A BMI below 18.5 is considered underweight. ? A BMI of 18.5 to 24.9 is normal. ? A BMI of 25 to 29.9 is considered overweight. ? A BMI of 30 and above is considered obese.  Watch levels of cholesterol and blood lipids  You should start having your blood tested for lipids and cholesterol at 80 years of age, then have this test every 5 years.  You may need to have your cholesterol levels checked more often if: ? Your lipid or cholesterol levels are high. ? You are older than 80 years of age. ? You are at high risk for heart disease.  Cancer screening Lung Cancer  Lung cancer screening is recommended for adults 8-91 years old who are at high risk for lung cancer because of a history of smoking.  A yearly low-dose CT scan of the lungs is recommended for people who: ? Currently smoke. ? Have quit within the past 15  years. ? Have at least a 30-pack-year history of smoking. A pack year is smoking an average of one pack of cigarettes a day for 1 year.  Yearly screening should continue until it has been 15 years since you quit.  Yearly screening should stop if you develop a health problem that would prevent you from having lung cancer treatment.  Breast Cancer  Practice breast self-awareness. This means understanding how your breasts normally appear and feel.  It also means doing regular breast self-exams. Let your health care provider know about any changes, no  matter how small.  If you are in your 20s or 30s, you should have a clinical breast exam (CBE) by a health care provider every 1-3 years as part of a regular health exam.  If you are 52 or older, have a CBE every year. Also consider having a breast X-ray (mammogram) every year.  If you have a family history of breast cancer, talk to your health care provider about genetic screening.  If you are at high risk for breast cancer, talk to your health care provider about having an MRI and a mammogram every year.  Breast cancer gene (BRCA) assessment is recommended for women who have family members with BRCA-related cancers. BRCA-related cancers include: ? Breast. ? Ovarian. ? Tubal. ? Peritoneal cancers.  Results of the assessment will determine the need for genetic counseling and BRCA1 and BRCA2 testing.  Cervical Cancer Your health care provider may recommend that you be screened regularly for cancer of the pelvic organs (ovaries, uterus, and vagina). This screening involves a pelvic examination, including checking for microscopic changes to the surface of your cervix (Pap test). You may be encouraged to have this screening done every 3 years, beginning at age 38.  For women ages 96-65, health care providers may recommend pelvic exams and Pap testing every 3 years, or they may recommend the Pap and pelvic exam, combined with testing for human papilloma virus (HPV), every 5 years. Some types of HPV increase your risk of cervical cancer. Testing for HPV may also be done on women of any age with unclear Pap test results.  Other health care providers may not recommend any screening for nonpregnant women who are considered low risk for pelvic cancer and who do not have symptoms. Ask your health care provider if a screening pelvic exam is right for you.  If you have had past treatment for cervical cancer or a condition that could lead to cancer, you need Pap tests and screening for cancer for at least  20 years after your treatment. If Pap tests have been discontinued, your risk factors (such as having a new sexual partner) need to be reassessed to determine if screening should resume. Some women have medical problems that increase the chance of getting cervical cancer. In these cases, your health care provider may recommend more frequent screening and Pap tests.  Colorectal Cancer  This type of cancer can be detected and often prevented.  Routine colorectal cancer screening usually begins at 80 years of age and continues through 80 years of age.  Your health care provider may recommend screening at an earlier age if you have risk factors for colon cancer.  Your health care provider may also recommend using home test kits to check for hidden blood in the stool.  A small camera at the end of a tube can be used to examine your colon directly (sigmoidoscopy or colonoscopy). This is done to check for the earliest forms  of colorectal cancer.  Routine screening usually begins at age 81.  Direct examination of the colon should be repeated every 5-10 years through 80 years of age. However, you may need to be screened more often if early forms of precancerous polyps or small growths are found.  Skin Cancer  Check your skin from head to toe regularly.  Tell your health care provider about any new moles or changes in moles, especially if there is a change in a mole's shape or color.  Also tell your health care provider if you have a mole that is larger than the size of a pencil eraser.  Always use sunscreen. Apply sunscreen liberally and repeatedly throughout the day.  Protect yourself by wearing long sleeves, pants, a wide-brimmed hat, and sunglasses whenever you are outside.  Heart disease, diabetes, and high blood pressure  High blood pressure causes heart disease and increases the risk of stroke. High blood pressure is more likely to develop in: ? People who have blood pressure in the  high end of the normal range (130-139/85-89 mm Hg). ? People who are overweight or obese. ? People who are African American.  If you are 68-38 years of age, have your blood pressure checked every 3-5 years. If you are 14 years of age or older, have your blood pressure checked every year. You should have your blood pressure measured twice-once when you are at a hospital or clinic, and once when you are not at a hospital or clinic. Record the average of the two measurements. To check your blood pressure when you are not at a hospital or clinic, you can use: ? An automated blood pressure machine at a pharmacy. ? A home blood pressure monitor.  If you are between 76 years and 64 years old, ask your health care provider if you should take aspirin to prevent strokes.  Have regular diabetes screenings. This involves taking a blood sample to check your fasting blood sugar level. ? If you are at a normal weight and have a low risk for diabetes, have this test once every three years after 80 years of age. ? If you are overweight and have a high risk for diabetes, consider being tested at a younger age or more often. Preventing infection Hepatitis B  If you have a higher risk for hepatitis B, you should be screened for this virus. You are considered at high risk for hepatitis B if: ? You were born in a country where hepatitis B is common. Ask your health care provider which countries are considered high risk. ? Your parents were born in a high-risk country, and you have not been immunized against hepatitis B (hepatitis B vaccine). ? You have HIV or AIDS. ? You use needles to inject street drugs. ? You live with someone who has hepatitis B. ? You have had sex with someone who has hepatitis B. ? You get hemodialysis treatment. ? You take certain medicines for conditions, including cancer, organ transplantation, and autoimmune conditions.  Hepatitis C  Blood testing is recommended for: ? Everyone born  from [birth year] through 1963. ? Anyone with known risk factors for hepatitis C.  Sexually transmitted infections (STIs)  You should be screened for sexually transmitted infections (STIs) including gonorrhea and chlamydia if: ? You are sexually active and are younger than 80 years of age. ? You are older than 80 years of age and your health care provider tells you that you are at risk for this type of infection. ?  Your sexual activity has changed since you were last screened and you are at an increased risk for chlamydia or gonorrhea. Ask your health care provider if you are at risk.  If you do not have HIV, but are at risk, it may be recommended that you take a prescription medicine daily to prevent HIV infection. This is called pre-exposure prophylaxis (PrEP). You are considered at risk if: ? You are sexually active and do not regularly use condoms or know the HIV status of your partner(s). ? You take drugs by injection. ? You are sexually active with a partner who has HIV.  Talk with your health care provider about whether you are at high risk of being infected with HIV. If you choose to begin PrEP, you should first be tested for HIV. You should then be tested every 3 months for as long as you are taking PrEP. Pregnancy  If you are premenopausal and you may become pregnant, ask your health care provider about preconception counseling.  If you may become pregnant, take 400 to 800 micrograms (mcg) of folic acid every day.  If you want to prevent pregnancy, talk to your health care provider about birth control (contraception). Osteoporosis and menopause  Osteoporosis is a disease in which the bones lose minerals and strength with aging. This can result in serious bone fractures. Your risk for osteoporosis can be identified using a bone density scan.  If you are 21 years of age or older, or if you are at risk for osteoporosis and fractures, ask your health care provider if you should be  screened.  Ask your health care provider whether you should take a calcium or vitamin D supplement to lower your risk for osteoporosis.  Menopause may have certain physical symptoms and risks.  Hormone replacement therapy may reduce some of these symptoms and risks. Talk to your health care provider about whether hormone replacement therapy is right for you. Follow these instructions at home:  Schedule regular health, dental, and eye exams.  Stay current with your immunizations.  Do not use any tobacco products including cigarettes, chewing tobacco, or electronic cigarettes.  If you are pregnant, do not drink alcohol.  If you are breastfeeding, limit how much and how often you drink alcohol.  Limit alcohol intake to no more than 1 drink per day for nonpregnant women. One drink equals 12 ounces of beer, 5 ounces of Lyall Faciane, or 1 ounces of hard liquor.  Do not use street drugs.  Do not share needles.  Ask your health care provider for help if you need support or information about quitting drugs.  Tell your health care provider if you often feel depressed.  Tell your health care provider if you have ever been abused or do not feel safe at home. This information is not intended to replace advice given to you by your health care provider. Make sure you discuss any questions you have with your health care provider. Document Released: 04/10/2011 Document Revised: 03/02/2016 Document Reviewed: 06/29/2015 Elsevier Interactive Patient Education  [employer].

## 2018-05-22 ENCOUNTER — Ambulatory Visit (INDEPENDENT_AMBULATORY_CARE_PROVIDER_SITE_OTHER): Payer: Medicare Other | Admitting: Internal Medicine

## 2018-05-22 ENCOUNTER — Encounter: Payer: Self-pay | Admitting: Internal Medicine

## 2018-05-22 ENCOUNTER — Other Ambulatory Visit (INDEPENDENT_AMBULATORY_CARE_PROVIDER_SITE_OTHER): Payer: Medicare Other

## 2018-05-22 VITALS — BP 104/60 | HR 76 | Temp 98.0°F | Ht 62.0 in | Wt 132.0 lb

## 2018-05-22 DIAGNOSIS — E785 Hyperlipidemia, unspecified: Secondary | ICD-10-CM

## 2018-05-22 DIAGNOSIS — J449 Chronic obstructive pulmonary disease, unspecified: Secondary | ICD-10-CM | POA: Diagnosis not present

## 2018-05-22 DIAGNOSIS — I6523 Occlusion and stenosis of bilateral carotid arteries: Secondary | ICD-10-CM

## 2018-05-22 DIAGNOSIS — F0280 Dementia in other diseases classified elsewhere without behavioral disturbance: Secondary | ICD-10-CM | POA: Diagnosis not present

## 2018-05-22 DIAGNOSIS — I10 Essential (primary) hypertension: Secondary | ICD-10-CM | POA: Diagnosis not present

## 2018-05-22 DIAGNOSIS — G301 Alzheimer's disease with late onset: Secondary | ICD-10-CM

## 2018-05-22 LAB — CBC WITH DIFFERENTIAL/PLATELET
Basophils Absolute: 0.1 10*3/uL (ref 0.0–0.1)
Basophils Relative: 1 % (ref 0.0–3.0)
Eosinophils Absolute: 0.1 10*3/uL (ref 0.0–0.7)
Eosinophils Relative: 1 % (ref 0.0–5.0)
HEMATOCRIT: 45.1 % (ref 36.0–46.0)
HEMOGLOBIN: 15.2 g/dL — AB (ref 12.0–15.0)
LYMPHS PCT: 34.7 % (ref 12.0–46.0)
Lymphs Abs: 2.6 10*3/uL (ref 0.7–4.0)
MCHC: 33.6 g/dL (ref 30.0–36.0)
MCV: 87.2 fl (ref 78.0–100.0)
MONOS PCT: 7 % (ref 3.0–12.0)
Monocytes Absolute: 0.5 10*3/uL (ref 0.1–1.0)
NEUTROS ABS: 4.3 10*3/uL (ref 1.4–7.7)
Neutrophils Relative %: 56.3 % (ref 43.0–77.0)
Platelets: 145 10*3/uL — ABNORMAL LOW (ref 150.0–400.0)
RBC: 5.17 Mil/uL — ABNORMAL HIGH (ref 3.87–5.11)
RDW: 13.8 % (ref 11.5–15.5)
WBC: 7.6 10*3/uL (ref 4.0–10.5)

## 2018-05-22 LAB — BASIC METABOLIC PANEL
BUN: 19 mg/dL (ref 6–23)
CHLORIDE: 104 meq/L (ref 96–112)
CO2: 32 meq/L (ref 19–32)
Calcium: 9.7 mg/dL (ref 8.4–10.5)
Creatinine, Ser: 1.04 mg/dL (ref 0.40–1.20)
GFR: 54.12 mL/min — ABNORMAL LOW (ref >60.00)
GLUCOSE: 143 mg/dL — AB (ref 70–99)
Potassium: 4.4 mEq/L (ref 3.5–5.1)
Sodium: 144 mEq/L (ref 135–145)

## 2018-05-22 MED ORDER — UMECLIDINIUM BROMIDE 62.5 MCG/INH IN AEPB: 1.0000 | INHALATION_SPRAY | Freq: Every day | RESPIRATORY_TRACT | 3 refills | Status: DC

## 2018-05-22 NOTE — Assessment & Plan Note (Signed)
Incruse Ellipta

## 2018-05-22 NOTE — Assessment & Plan Note (Signed)
Aricept and Namenda po 

## 2018-05-22 NOTE — Assessment & Plan Note (Signed)
Simvastatin 

## 2018-05-22 NOTE — Assessment & Plan Note (Signed)
Carot duplex is ordered

## 2018-05-22 NOTE — Assessment & Plan Note (Signed)
Losartan 

## 2018-05-22 NOTE — Progress Notes (Signed)
Subjective:  Patient ID: [name], female    DOB: 1936-09-14  Age: 82 y.o. MRN: [id number]  CC: No chief complaint on file.   HPI [name] presents for dementia, COPD, GERD f/u  Outpatient Medications Prior to Visit  Medication Sig Dispense Refill  . acetaminophen (TYLENOL) 325 MG tablet Take 650 mg by mouth every 6 (six) hours as needed.    Marland Kitchen aspirin 81 MG EC tablet Take 81 mg by mouth daily.     . Cholecalciferol (VITAMIN D3) 2000 units capsule Take 1 capsule (2,000 Units total) daily by mouth. 100 capsule 3  . Diclofenac Sodium (PENNSAID) 1.5 % SOLN Place 3-5 drops onto the skin 3 (three) times daily as needed. For pain applies on joints    . donepezil (ARICEPT) 10 MG tablet Take 1 tablet (10 mg total) by mouth at bedtime. 90 tablet 3  . furosemide (LASIX) 20 MG tablet Take 2 tablets (40 mg total) by mouth daily. 180 tablet 3  . INCRUSE ELLIPTA 62.5 MCG/INH AEPB USE 1 INHALATION DAILY 90 each 3  . losartan (COZAAR) 50 MG tablet Take 1 tablet (50 mg total) by mouth 2 (two) times daily. 180 tablet 3  . memantine (NAMENDA) 10 MG tablet Take 1 tablet (10 mg total) by mouth 2 (two) times daily. 180 tablet 3  . Multiple Vitamin (MULTIVITAMIN) tablet Take 1 tablet by mouth daily.    Marland Kitchen omeprazole (PRILOSEC) 40 MG capsule Take 1 capsule (40 mg total) by mouth daily. 90 capsule 3  . ondansetron (ZOFRAN) 4 MG tablet Take 1 tablet (4 mg total) by mouth every 8 (eight) hours as needed. Take  1 tab every 6 hours as needed for nausea and vomiting. 60 tablet 1  . simvastatin (ZOCOR) 40 MG tablet Take 1 tablet (40 mg total) by mouth at bedtime. 90 tablet 3  . SYNTHROID 50 MCG tablet Take 1 tablet (50 mcg total) by mouth daily. 90 tablet 3   No facility-administered medications prior to visit.     ROS: Review of Systems  Constitutional: Negative for activity change, appetite change, chills, fatigue and unexpected weight change.  HENT: Negative for congestion, mouth  sores and sinus pressure.   Eyes: Negative for visual disturbance.  Respiratory: Positive for cough and shortness of breath. Negative for chest tightness.   Gastrointestinal: Negative for abdominal pain and nausea.  Genitourinary: Negative for difficulty urinating, frequency and vaginal pain.  Musculoskeletal: Positive for gait problem. Negative for back pain.  Skin: Negative for pallor and rash.  Neurological: Negative for dizziness, tremors, weakness, numbness and headaches.  Psychiatric/Behavioral: Positive for decreased concentration. Negative for confusion, sleep disturbance and suicidal ideas.    Objective:  BP 104/60 (BP Location: Left Arm, Patient Position: Sitting, Cuff Size: Normal)   Pulse 76   Temp 98 F (36.7 C) (Oral)   Ht 5\' 2"  (1.575 m)   Wt 132 lb (59.9 kg)   SpO2 94%   BMI 24.14 kg/m   BP Readings from Last 3 Encounters:  05/22/18 104/60  04/17/18 (!) 106/58  04/16/18 124/76    Wt Readings from Last 3 Encounters:  05/22/18 132 lb (59.9 kg)  04/17/18 132 lb (59.9 kg)  04/16/18 131 lb (59.4 kg)    Physical Exam  Constitutional: She appears well-developed. No distress.  HENT:  Head: Normocephalic.  Right Ear: External ear normal.  Left Ear: External ear normal.  Nose: Nose normal.  Mouth/Throat: Oropharynx is clear and moist.  Eyes: Pupils are  equal, round, and reactive to light. Conjunctivae are normal. Right eye exhibits no discharge. Left eye exhibits no discharge.  Neck: Normal range of motion. Neck supple. No JVD present. No tracheal deviation present. No thyromegaly present.  Cardiovascular: Normal rate, regular rhythm and normal heart sounds.  Pulmonary/Chest: No stridor. No respiratory distress. She has no wheezes.  Abdominal: Soft. Bowel sounds are normal. She exhibits no distension and no mass. There is no tenderness. There is no rebound and no guarding.  Musculoskeletal: She exhibits no edema or tenderness.  Lymphadenopathy:    She has no  cervical adenopathy.  Neurological: She displays normal reflexes. No cranial nerve deficit. She exhibits normal muscle tone. Coordination normal.  Skin: No rash noted. No erythema.  Psychiatric: She has a normal mood and affect. Her behavior is normal. Judgment and thought content normal.   Cooperative Ataxic a little  Lab Results  Component Value Date   WBC 9.0 11/15/2017   HGB 15.6 (H) 11/15/2017   HCT 45.9 11/15/2017   PLT 164.0 11/15/2017   GLUCOSE 132 (H) 11/15/2017   CHOL 155 11/15/2017   TRIG 206.0 (H) 11/15/2017   HDL 40.30 11/15/2017   LDLDIRECT 90.0 11/15/2017   LDLCALC 70 12/16/2013   ALT 12 01/03/2017   AST 16 01/03/2017   NA 142 11/15/2017   K 3.7 11/15/2017   CL 101 11/15/2017   CREATININE 1.00 11/15/2017   BUN 15 11/15/2017   CO2 33 (H) 11/15/2017   TSH 2.63 11/15/2017   INR 1.15 08/10/2011   HGBA1C 5.8 10/12/2011    Dexascan  Result Date: 06/27/2017 EXAM: DUAL X-RAY ABSORPTIOMETRY (DXA) FOR BONE MINERAL DENSITY IMPRESSION: Referring Physician:  Cassandria Anger PATIENT: Name: Kelly, Knapp Patient ID: [id number] Birth Date: 1936/01/27 Height: 61.8 in. Sex: Female Measured: 06/27/2017 Weight: 136.3 lbs. Indications: Advanced Age, Estrogen Deficient, Hypothyroid, Hysterectomy, Postmenopausal, Synthroid, Tobacco User (Current Smoker) Fractures: None Treatments: Multivitamin ASSESSMENT: The BMD measured at Femur Total Left is 0.691 g/cm2 with a T-score of -2.5. This patient is considered osteoporotic according to [facility]) criteria. There has been a statistically significant decrease in BMD of Left hip, and no statistically significant change in BMD of Lumbar Spine since prior exam dated 11/20/2002. Site Region Measured Date Measured Age YA BMD Significant CHANGE T-score DualFemur Total Left 06/27/2017 79.5 -2.7 0.691 g/cm2 * AP Spine  L1-L4      8/27/2017    79.5         -2.3    0.912 g/cm2 World Health Organization [facility]) criteria for  post-menopausal, Caucasian Women: Normal       T-score at or above -1 SD Osteopenia   T-score between -1 and -2.5 SD Osteoporosis T-score at or below -2.5 SD RECOMMENDATION: [place] recommends that FDA-approved medical therapies be considered in postmenopausal women and men age 53 or older with a: 1. Hip or vertebral (clinical or morphometric) fracture. 2. T-score of <-2.5 at the spine or hip. 3. Ten-year fracture probability by FRAX of 3% or greater for hip fracture or 20% or greater for major osteoporotic fracture. All treatment decisions require clinical judgment and consideration of individual patient factors, including patient preferences, co-morbidities, previous drug use, risk factors not captured in the FRAX model (e.g. falls, vitamin D deficiency, increased bone turnover, interval significant decline in bone density) and possible under - or over-estimation of fracture risk by FRAX. All patients should ensure an adequate intake of dietary calcium (1200 mg/d) and vitamin D (800 IU daily) unless  contraindicated. FOLLOW-UP: People with diagnosed cases of osteoporosis or at high risk for fracture should have regular bone mineral density tests. For patients eligible for Medicare, routine testing is allowed once every 2 years. The testing frequency can be increased to one year for patients who have rapidly progressing disease, those who are receiving or discontinuing medical therapy to restore bone mass, or have additional risk factors. I have reviewed this report, and agree with the above findings. [facility] Radiology Electronically Signed   By: Rolm Baptise M.D.   On: 06/27/2017 13:58    Assessment & Plan:   There are no diagnoses linked to this encounter.   No orders of the defined types were placed in this encounter.    Follow-up: No follow-ups on file.  [name], MD

## 2018-09-03 ENCOUNTER — Encounter: Payer: Self-pay | Admitting: Internal Medicine

## 2018-09-03 ENCOUNTER — Ambulatory Visit (INDEPENDENT_AMBULATORY_CARE_PROVIDER_SITE_OTHER): Payer: Medicare Other | Admitting: Internal Medicine

## 2018-09-03 DIAGNOSIS — F0280 Dementia in other diseases classified elsewhere without behavioral disturbance: Secondary | ICD-10-CM | POA: Diagnosis not present

## 2018-09-03 DIAGNOSIS — J449 Chronic obstructive pulmonary disease, unspecified: Secondary | ICD-10-CM

## 2018-09-03 DIAGNOSIS — I6523 Occlusion and stenosis of bilateral carotid arteries: Secondary | ICD-10-CM | POA: Diagnosis not present

## 2018-09-03 DIAGNOSIS — R413 Other amnesia: Secondary | ICD-10-CM

## 2018-09-03 DIAGNOSIS — G301 Alzheimer's disease with late onset: Secondary | ICD-10-CM

## 2018-09-03 DIAGNOSIS — E034 Atrophy of thyroid (acquired): Secondary | ICD-10-CM

## 2018-09-03 NOTE — Assessment & Plan Note (Signed)
On Aricept and Namenda F/u w/Dr Tomi Likens

## 2018-09-03 NOTE — Progress Notes (Signed)
Subjective:  Patient ID: [name], female    DOB: 1936-03-27  Age: 82 y.o. MRN: [id number]  CC: No chief complaint on file.   HPI [name] presents for hypothyroidism, HTN, memory loss f/u  Outpatient Medications Prior to Visit  Medication Sig Dispense Refill  . acetaminophen (TYLENOL) 325 MG tablet Take 650 mg by mouth every 6 (six) hours as needed.    Marland Kitchen aspirin 81 MG EC tablet Take 81 mg by mouth daily.     . Cholecalciferol (VITAMIN D3) 2000 units capsule Take 1 capsule (2,000 Units total) daily by mouth. 100 capsule 3  . Diclofenac Sodium (PENNSAID) 1.5 % SOLN Place 3-5 drops onto the skin 3 (three) times daily as needed. For pain applies on joints    . donepezil (ARICEPT) 10 MG tablet Take 1 tablet (10 mg total) by mouth at bedtime. 90 tablet 3  . furosemide (LASIX) 20 MG tablet Take 2 tablets (40 mg total) by mouth daily. 180 tablet 3  . losartan (COZAAR) 50 MG tablet Take 1 tablet (50 mg total) by mouth 2 (two) times daily. 180 tablet 3  . memantine (NAMENDA) 10 MG tablet Take 1 tablet (10 mg total) by mouth 2 (two) times daily. 180 tablet 3  . Multiple Vitamin (MULTIVITAMIN) tablet Take 1 tablet by mouth daily.    Marland Kitchen omeprazole (PRILOSEC) 40 MG capsule Take 1 capsule (40 mg total) by mouth daily. 90 capsule 3  . ondansetron (ZOFRAN) 4 MG tablet Take 1 tablet (4 mg total) by mouth every 8 (eight) hours as needed. Take  1 tab every 6 hours as needed for nausea and vomiting. 60 tablet 1  . simvastatin (ZOCOR) 40 MG tablet Take 1 tablet (40 mg total) by mouth at bedtime. 90 tablet 3  . SYNTHROID 50 MCG tablet Take 1 tablet (50 mcg total) by mouth daily. 90 tablet 3  . umeclidinium bromide (INCRUSE ELLIPTA) 62.5 MCG/INH AEPB Inhale 1 puff into the lungs daily. 90 each 3   No facility-administered medications prior to visit.     ROS: Review of Systems  Constitutional: Negative for activity change, appetite change, chills, fatigue and unexpected weight  change.  HENT: Negative for congestion, mouth sores and sinus pressure.   Eyes: Negative for visual disturbance.  Respiratory: Positive for cough. Negative for chest tightness.   Gastrointestinal: Negative for abdominal pain and nausea.  Genitourinary: Negative for difficulty urinating, frequency and vaginal pain.  Musculoskeletal: Negative for back pain and gait problem.  Skin: Negative for pallor and rash.  Neurological: Negative for dizziness, tremors, weakness, numbness and headaches.  Psychiatric/Behavioral: Positive for confusion and decreased concentration. Negative for sleep disturbance and suicidal ideas.    Objective:  BP 108/66 (BP Location: Left Arm, Patient Position: Sitting, Cuff Size: Normal)   Pulse 74   Temp 98 F (36.7 C) (Oral)   Ht 5\' 2"  (1.575 m)   Wt 135 lb (61.2 kg)   SpO2 96%   BMI 24.69 kg/m   BP Readings from Last 3 Encounters:  09/03/18 108/66  05/22/18 104/60  04/17/18 (!) 106/58    Wt Readings from Last 3 Encounters:  09/03/18 135 lb (61.2 kg)  05/22/18 132 lb (59.9 kg)  04/17/18 132 lb (59.9 kg)    Physical Exam  Constitutional: She appears well-developed. No distress.  HENT:  Head: Normocephalic.  Right Ear: External ear normal.  Left Ear: External ear normal.  Nose: Nose normal.  Mouth/Throat: Oropharynx is clear and moist.  Eyes:  Pupils are equal, round, and reactive to light. Conjunctivae are normal. Right eye exhibits no discharge. Left eye exhibits no discharge.  Neck: Normal range of motion. Neck supple. No JVD present. No tracheal deviation present. No thyromegaly present.  Cardiovascular: Normal rate, regular rhythm and normal heart sounds.  Pulmonary/Chest: No stridor. No respiratory distress. She has no wheezes.  Abdominal: Soft. Bowel sounds are normal. She exhibits no distension and no mass. There is no tenderness. There is no rebound and no guarding.  Musculoskeletal: She exhibits no edema or tenderness.  Lymphadenopathy:     She has no cervical adenopathy.  Neurological: She displays normal reflexes. No cranial nerve deficit. She exhibits normal muscle tone. Coordination normal.  Skin: No rash noted. No erythema.  Psychiatric: She has a normal mood and affect. Her behavior is normal. Thought content normal.    Lab Results  Component Value Date   WBC 7.6 05/22/2018   HGB 15.2 (H) 05/22/2018   HCT 45.1 05/22/2018   PLT 145.0 (L) 05/22/2018   GLUCOSE 143 (H) 05/22/2018   CHOL 155 11/15/2017   TRIG 206.0 (H) 11/15/2017   HDL 40.30 11/15/2017   LDLDIRECT 90.0 11/15/2017   LDLCALC 70 12/16/2013   ALT 12 01/03/2017   AST 16 01/03/2017   NA 144 05/22/2018   K 4.4 05/22/2018   CL 104 05/22/2018   CREATININE 1.04 05/22/2018   BUN 19 05/22/2018   CO2 32 05/22/2018   TSH 2.63 11/15/2017   INR 1.15 08/10/2011   HGBA1C 5.8 10/12/2011    Dexascan  Result Date: 06/27/2017 EXAM: DUAL X-RAY ABSORPTIOMETRY (DXA) FOR BONE MINERAL DENSITY IMPRESSION: Referring Physician:  Cassandria Anger PATIENT: Name: [name], [name] Patient ID: [id number] Birth Date: 1936-04-03 Height: 61.8 in. Sex: Female Measured: 06/27/2017 Weight: 136.3 lbs. Indications: Advanced Age, Estrogen Deficient, Hypothyroid, Hysterectomy, Postmenopausal, Synthroid, Tobacco User (Current Smoker) Fractures: None Treatments: Multivitamin ASSESSMENT: The BMD measured at Femur Total Left is 0.691 g/cm2 with a T-score of -2.5. This patient is considered osteoporotic according to [facility]) criteria. There has been a statistically significant decrease in BMD of Left hip, and no statistically significant change in BMD of Lumbar Spine since prior exam dated 11/20/2002. Site Region Measured Date Measured Age YA BMD Significant CHANGE T-score DualFemur Total Left 06/27/2017 79.5 -2.7 0.691 g/cm2 * AP Spine  L1-L4      8/27/2017    79.5         -2.3    0.912 g/cm2 World Health Organization [facility]) criteria for post-menopausal, Caucasian  Women: Normal       T-score at or above -1 SD Osteopenia   T-score between -1 and -2.5 SD Osteoporosis T-score at or below -2.5 SD RECOMMENDATION: [place] recommends that FDA-approved medical therapies be considered in postmenopausal women and men age 32 or older with a: 1. Hip or vertebral (clinical or morphometric) fracture. 2. T-score of <-2.5 at the spine or hip. 3. Ten-year fracture probability by FRAX of 3% or greater for hip fracture or 20% or greater for major osteoporotic fracture. All treatment decisions require clinical judgment and consideration of individual patient factors, including patient preferences, co-morbidities, previous drug use, risk factors not captured in the FRAX model (e.g. falls, vitamin D deficiency, increased bone turnover, interval significant decline in bone density) and possible under - or over-estimation of fracture risk by FRAX. All patients should ensure an adequate intake of dietary calcium (1200 mg/d) and vitamin D (800 IU daily) unless contraindicated. FOLLOW-UP: People with  diagnosed cases of osteoporosis or at high risk for fracture should have regular bone mineral density tests. For patients eligible for Medicare, routine testing is allowed once every 2 years. The testing frequency can be increased to one year for patients who have rapidly progressing disease, those who are receiving or discontinuing medical therapy to restore bone mass, or have additional risk factors. I have reviewed this report, and agree with the above findings. [facility] Radiology Electronically Signed   By: Rolm Baptise M.D.   On: 06/27/2017 13:58    Assessment & Plan:   There are no diagnoses linked to this encounter.   No orders of the defined types were placed in this encounter.    Follow-up: No follow-ups on file.  [name], MD

## 2018-09-03 NOTE — Assessment & Plan Note (Signed)
Levothroid 

## 2018-09-03 NOTE — Assessment & Plan Note (Signed)
Aricept and Namenda po 

## 2018-09-03 NOTE — Patient Instructions (Signed)

## 2018-09-03 NOTE — Assessment & Plan Note (Signed)
Incruse Ellipta

## 2018-09-16 ENCOUNTER — Ambulatory Visit (INDEPENDENT_AMBULATORY_CARE_PROVIDER_SITE_OTHER): Payer: Medicare Other

## 2018-09-16 DIAGNOSIS — M810 Age-related osteoporosis without current pathological fracture: Secondary | ICD-10-CM

## 2018-09-16 MED ORDER — DENOSUMAB 60 MG/ML ~~LOC~~ SOSY
60.0000 mg | PREFILLED_SYRINGE | Freq: Once | SUBCUTANEOUS | Status: AC
  Administered 2018-09-16: 60 mg via SUBCUTANEOUS

## 2018-10-27 ENCOUNTER — Other Ambulatory Visit: Payer: Self-pay | Admitting: Internal Medicine

## 2018-11-10 ENCOUNTER — Other Ambulatory Visit: Payer: Self-pay | Admitting: Internal Medicine

## 2018-11-29 ENCOUNTER — Other Ambulatory Visit: Payer: Self-pay | Admitting: Internal Medicine

## 2018-12-30 ENCOUNTER — Other Ambulatory Visit: Payer: Self-pay | Admitting: Internal Medicine

## 2019-01-07 ENCOUNTER — Other Ambulatory Visit (INDEPENDENT_AMBULATORY_CARE_PROVIDER_SITE_OTHER): Payer: Medicare Other

## 2019-01-07 ENCOUNTER — Encounter: Payer: Self-pay | Admitting: Internal Medicine

## 2019-01-07 ENCOUNTER — Other Ambulatory Visit: Payer: Self-pay

## 2019-01-07 ENCOUNTER — Ambulatory Visit (INDEPENDENT_AMBULATORY_CARE_PROVIDER_SITE_OTHER): Payer: Medicare Other | Admitting: Internal Medicine

## 2019-01-07 DIAGNOSIS — E034 Atrophy of thyroid (acquired): Secondary | ICD-10-CM

## 2019-01-07 DIAGNOSIS — I10 Essential (primary) hypertension: Secondary | ICD-10-CM | POA: Diagnosis not present

## 2019-01-07 DIAGNOSIS — J449 Chronic obstructive pulmonary disease, unspecified: Secondary | ICD-10-CM

## 2019-01-07 DIAGNOSIS — F0280 Dementia in other diseases classified elsewhere without behavioral disturbance: Secondary | ICD-10-CM

## 2019-01-07 DIAGNOSIS — G301 Alzheimer's disease with late onset: Secondary | ICD-10-CM | POA: Diagnosis not present

## 2019-01-07 LAB — BASIC METABOLIC PANEL
BUN: 16 mg/dL (ref 6–23)
CO2: 29 mEq/L (ref 19–32)
Calcium: 9.2 mg/dL (ref 8.4–10.5)
Chloride: 101 mEq/L (ref 96–112)
Creatinine, Ser: 1.06 mg/dL (ref 0.40–1.20)
GFR: 49.74 mL/min — ABNORMAL LOW (ref >60.00)
Glucose, Bld: 189 mg/dL — ABNORMAL HIGH (ref 70–99)
Potassium: 3.4 mEq/L — ABNORMAL LOW (ref 3.5–5.1)
Sodium: 140 mEq/L (ref 135–145)

## 2019-01-07 LAB — TSH: TSH: 1.5 u[IU]/mL (ref 0.35–4.50)

## 2019-01-07 NOTE — Assessment & Plan Note (Signed)
Losartan 

## 2019-01-07 NOTE — Assessment & Plan Note (Addendum)
Incruse ellipta

## 2019-01-07 NOTE — Progress Notes (Signed)
Subjective:  Patient ID: [name], female    DOB: [birth year]  Age: 83 y.o. MRN: [id number]  CC: No chief complaint on file.   HPI [name] presents for COPD, dementia  Outpatient Medications Prior to Visit  Medication Sig Dispense Refill  . acetaminophen (TYLENOL) 325 MG tablet Take 650 mg by mouth every 6 (six) hours as needed.    Marland Kitchen aspirin 81 MG EC tablet Take 81 mg by mouth daily.     . Cholecalciferol (VITAMIN D3) 2000 units capsule Take 1 capsule (2,000 Units total) daily by mouth. 100 capsule 3  . Diclofenac Sodium (PENNSAID) 1.5 % SOLN Place 3-5 drops onto the skin 3 (three) times daily as needed. For pain applies on joints    . donepezil (ARICEPT) 10 MG tablet TAKE 1 TABLET AT BEDTIME 90 tablet 4  . furosemide (LASIX) 20 MG tablet TAKE 2 TABLETS DAILY 180 tablet 4  . losartan (COZAAR) 50 MG tablet TAKE 1 TABLET TWICE A DAY 180 tablet 3  . memantine (NAMENDA) 10 MG tablet TAKE 1 TABLET TWICE A DAY 180 tablet 3  . Multiple Vitamin (MULTIVITAMIN) tablet Take 1 tablet by mouth daily.    Marland Kitchen omeprazole (PRILOSEC) 40 MG capsule TAKE 1 CAPSULE DAILY 90 capsule 3  . ondansetron (ZOFRAN) 4 MG tablet Take 1 tablet (4 mg total) by mouth every 8 (eight) hours as needed. Take  1 tab every 6 hours as needed for nausea and vomiting. 60 tablet 1  . simvastatin (ZOCOR) 40 MG tablet TAKE 1 TABLET AT BEDTIME 90 tablet 3  . SYNTHROID 50 MCG tablet Take 1 tablet (50 mcg total) by mouth daily. 90 tablet 3  . umeclidinium bromide (INCRUSE ELLIPTA) 62.5 MCG/INH AEPB Inhale 1 puff into the lungs daily. 90 each 3   No facility-administered medications prior to visit.     ROS: Review of Systems  Constitutional: Negative for activity change, appetite change, chills, fatigue and unexpected weight change.  HENT: Negative for congestion, mouth sores and sinus pressure.   Eyes: Negative for visual disturbance.  Respiratory: Negative for cough and chest tightness.    Gastrointestinal: Negative for abdominal pain and nausea.  Genitourinary: Negative for difficulty urinating, frequency and vaginal pain.  Musculoskeletal: Negative for back pain and gait problem.  Skin: Negative for pallor and rash.  Neurological: Negative for dizziness, tremors, weakness, numbness and headaches.  Psychiatric/Behavioral: Positive for confusion and decreased concentration. Negative for sleep disturbance and suicidal ideas.    Objective:  BP 108/64 (BP Location: Left Arm, Patient Position: Sitting, Cuff Size: Normal)   Pulse 83   Temp (!) 97.4 F (36.3 C) (Oral)   Ht 5\' 2"  (1.575 m)   Wt 136 lb 1.3 oz (61.7 kg)   SpO2 94%   BMI 24.89 kg/m   BP Readings from Last 3 Encounters:  01/07/19 108/64  09/03/18 108/66  05/22/18 104/60    Wt Readings from Last 3 Encounters:  01/07/19 136 lb 1.3 oz (61.7 kg)  09/03/18 135 lb (61.2 kg)  05/22/18 132 lb (59.9 kg)    Physical Exam Constitutional:      General: She is not in acute distress.    Appearance: She is well-developed.  HENT:     Head: Normocephalic.     Right Ear: External ear normal.     Left Ear: External ear normal.     Nose: Nose normal.  Eyes:     General:        Right eye:  No discharge.        Left eye: No discharge.     Conjunctiva/sclera: Conjunctivae normal.     Pupils: Pupils are equal, round, and reactive to light.  Neck:     Musculoskeletal: Normal range of motion and neck supple.     Thyroid: No thyromegaly.     Vascular: No JVD.     Trachea: No tracheal deviation.  Cardiovascular:     Rate and Rhythm: Normal rate and regular rhythm.     Heart sounds: Normal heart sounds.  Pulmonary:     Effort: No respiratory distress.     Breath sounds: No stridor. No wheezing.  Abdominal:     General: Bowel sounds are normal. There is no distension.     Palpations: Abdomen is soft. There is no mass.     Tenderness: There is no abdominal tenderness. There is no guarding or rebound.   Musculoskeletal:        General: No tenderness.  Lymphadenopathy:     Cervical: No cervical adenopathy.  Skin:    Findings: No erythema or rash.  Neurological:     Mental Status: She is disoriented.     Cranial Nerves: No cranial nerve deficit.     Motor: No abnormal muscle tone.     Coordination: Coordination normal.     Deep Tendon Reflexes: Reflexes normal.  Psychiatric:        Behavior: Behavior normal.        Thought Content: Thought content normal.        Judgment: Judgment normal.     Lab Results  Component Value Date   WBC 7.6 05/22/2018   HGB 15.2 (H) 05/22/2018   HCT 45.1 05/22/2018   PLT 145.0 (L) 05/22/2018   GLUCOSE 143 (H) 05/22/2018   CHOL 155 11/15/2017   TRIG 206.0 (H) 11/15/2017   HDL 40.30 11/15/2017   LDLDIRECT 90.0 11/15/2017   LDLCALC 70 12/16/2013   ALT 12 01/03/2017   AST 16 01/03/2017   NA 144 05/22/2018   K 4.4 05/22/2018   CL 104 05/22/2018   CREATININE 1.04 05/22/2018   BUN 19 05/22/2018   CO2 32 05/22/2018   TSH 2.63 11/15/2017   INR 1.15 08/10/2011   HGBA1C 5.8 10/12/2011    Dexascan  Result Date: 06/27/2017 EXAM: DUAL X-RAY ABSORPTIOMETRY (DXA) FOR BONE MINERAL DENSITY IMPRESSION: Referring Physician:  Cassandria Anger PATIENT: Name: [name] Patient ID: [id number] Birth Date: 01-29-1936 Height: 61.8 in. Sex: Female Measured: 06/27/2017 Weight: 136.3 lbs. Indications: Advanced Age, Estrogen Deficient, Hypothyroid, Hysterectomy, Postmenopausal, Synthroid, Tobacco User (Current Smoker) Fractures: None Treatments: Multivitamin ASSESSMENT: The BMD measured at Femur Total Left is 0.691 g/cm2 with a T-score of -2.5. This patient is considered osteoporotic according to [facility]) criteria. There has been a statistically significant decrease in BMD of Left hip, and no statistically significant change in BMD of Lumbar Spine since prior exam dated 11/20/2002. Site Region Measured Date Measured Age YA BMD  Significant CHANGE T-score DualFemur Total Left 06/27/2017 79.5 -2.5 0.691 g/cm2 * AP Spine  L1-L4      06/27/2017    79.5         -2.3    0.912 g/cm2 World Health Organization [facility]) criteria for post-menopausal, Caucasian Women: Normal       T-score at or above -1 SD Osteopenia   T-score between -1 and -2.5 SD Osteoporosis T-score at or below -2.5 SD RECOMMENDATION: [place] recommends that FDA-approved medical therapies be  considered in postmenopausal women and men age 81 or older with a: 1. Hip or vertebral (clinical or morphometric) fracture. 2. T-score of <-2.5 at the spine or hip. 3. Ten-year fracture probability by FRAX of 3% or greater for hip fracture or 20% or greater for major osteoporotic fracture. All treatment decisions require clinical judgment and consideration of individual patient factors, including patient preferences, co-morbidities, previous drug use, risk factors not captured in the FRAX model (e.g. falls, vitamin D deficiency, increased bone turnover, interval significant decline in bone density) and possible under - or over-estimation of fracture risk by FRAX. All patients should ensure an adequate intake of dietary calcium (1200 mg/d) and vitamin D (800 IU daily) unless contraindicated. FOLLOW-UP: People with diagnosed cases of osteoporosis or at high risk for fracture should have regular bone mineral density tests. For patients eligible for Medicare, routine testing is allowed once every 2 years. The testing frequency can be increased to one year for patients who have rapidly progressing disease, those who are receiving or discontinuing medical therapy to restore bone mass, or have additional risk factors. I have reviewed this report, and agree with the above findings. [facility] Radiology Electronically Signed   By: Rolm Baptise M.D.   On: 06/27/2017 13:58    Assessment & Plan:   There are no diagnoses linked to this encounter.   No orders of the defined  types were placed in this encounter.    Follow-up: No follow-ups on file.  [name], MD

## 2019-01-07 NOTE — Assessment & Plan Note (Signed)
Labs

## 2019-01-07 NOTE — Assessment & Plan Note (Signed)
Aricept and Namenda po

## 2019-02-08 ENCOUNTER — Other Ambulatory Visit: Payer: Self-pay | Admitting: Internal Medicine

## 2019-02-25 IMAGING — DX DG CHEST 2V
2 series · 2 of 2 positions shown · non-contrast
Comparison: September 27, 2015

CLINICAL DATA: Cough and congestion for 3 days.  Hypertension.

EXAM:
CHEST  2 VIEW

[chest pa]
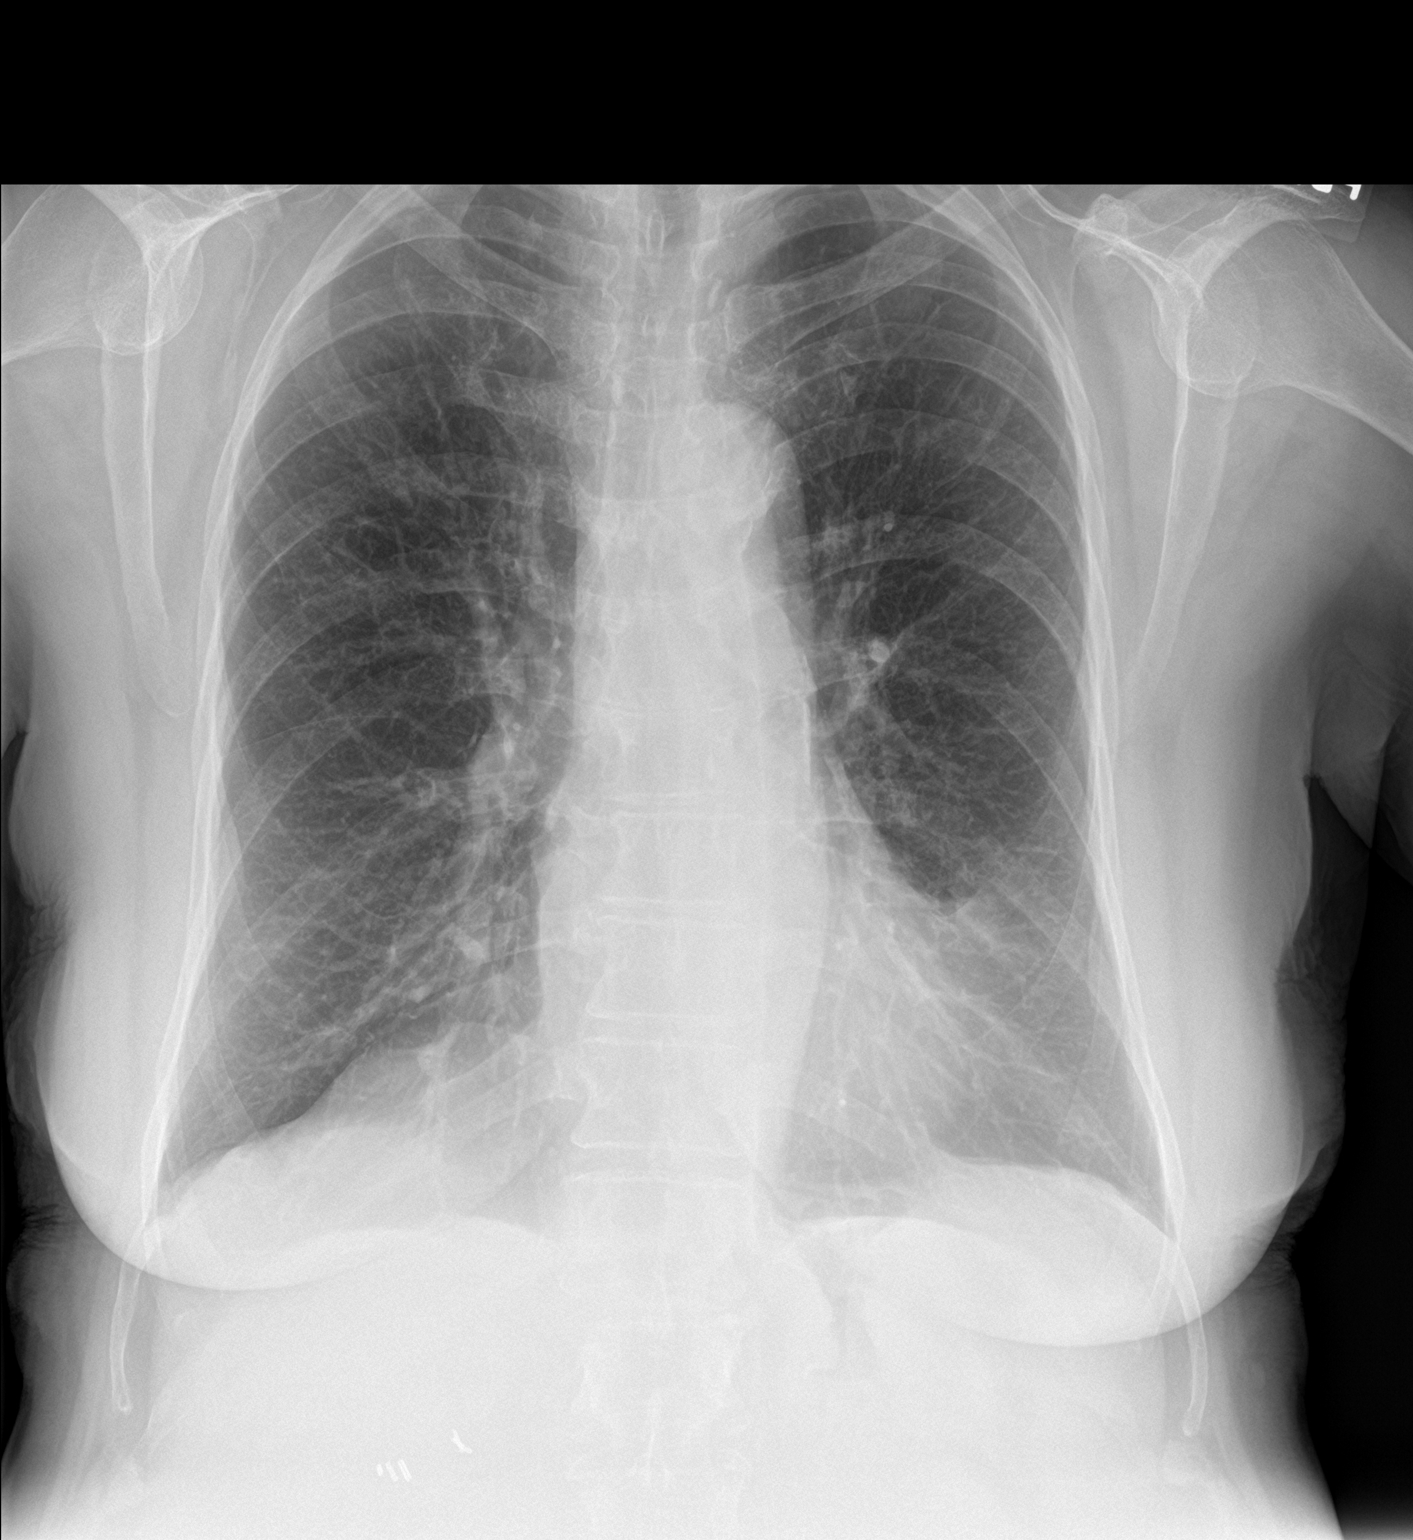

[chest lat]
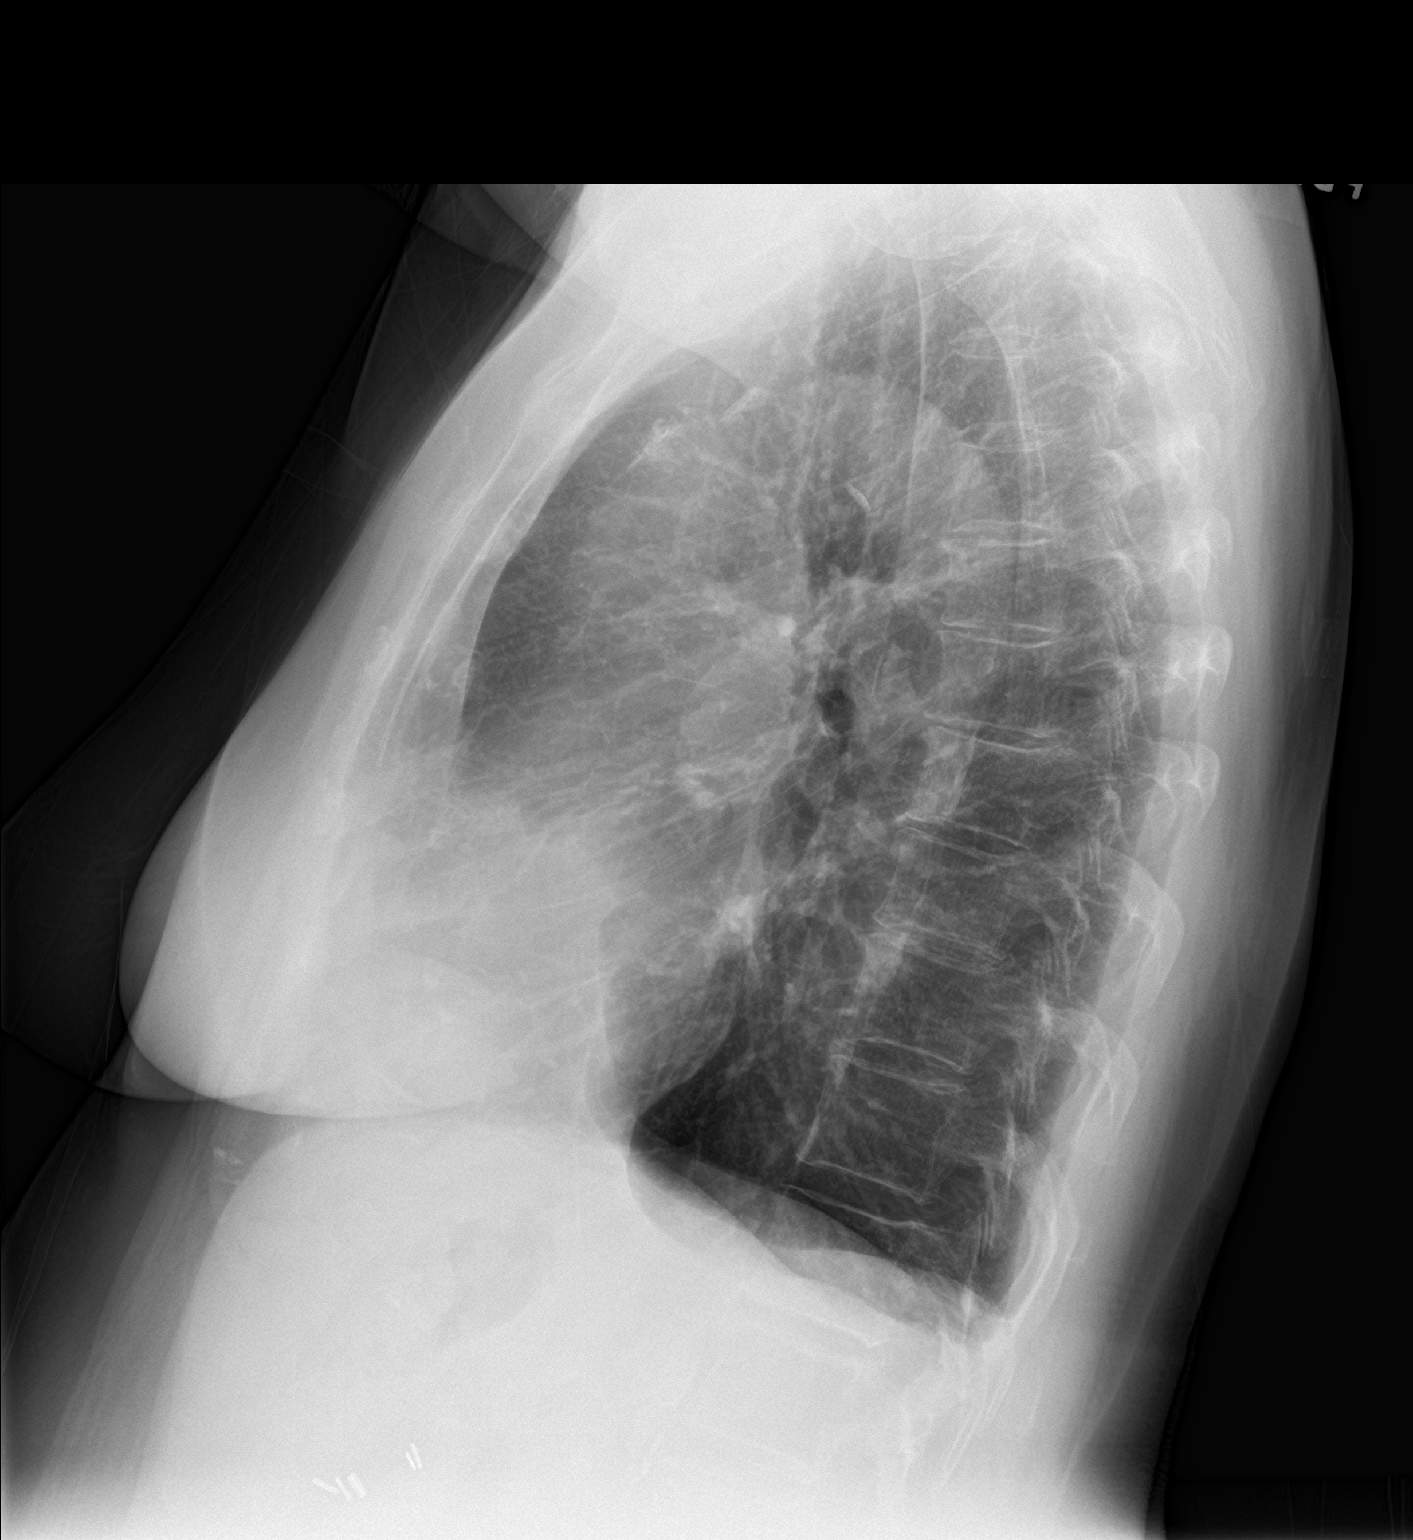

[2 of 2 positions shown; findings below may reference images not displayed]

FINDINGS: There are stable scattered areas of scarring. There is no edema or
consolidation. The heart size and pulmonary vascularity are normal.
There is aortic atherosclerosis. No adenopathy. There is
postoperative change in the lower cervical region. There are
surgical clips in the right upper quadrant of the abdomen.
IMPRESSION: No edema or consolidation. Scattered areas of mild scarring. Stable
cardiac silhouette. There is aortic atherosclerosis.

Aortic Atherosclerosis ([id number].G).

## 2019-03-11 ENCOUNTER — Ambulatory Visit: Payer: Medicare Other

## 2019-03-18 DIAGNOSIS — H04123 Dry eye syndrome of bilateral lacrimal glands: Secondary | ICD-10-CM | POA: Diagnosis not present

## 2019-03-18 DIAGNOSIS — Z961 Presence of intraocular lens: Secondary | ICD-10-CM | POA: Diagnosis not present

## 2019-04-08 ENCOUNTER — Ambulatory Visit (INDEPENDENT_AMBULATORY_CARE_PROVIDER_SITE_OTHER): Payer: Medicare Other | Admitting: Internal Medicine

## 2019-04-08 ENCOUNTER — Other Ambulatory Visit: Payer: Self-pay

## 2019-04-08 ENCOUNTER — Encounter: Payer: Self-pay | Admitting: Internal Medicine

## 2019-04-08 DIAGNOSIS — F17200 Nicotine dependence, unspecified, uncomplicated: Secondary | ICD-10-CM | POA: Diagnosis not present

## 2019-04-08 DIAGNOSIS — F0280 Dementia in other diseases classified elsewhere without behavioral disturbance: Secondary | ICD-10-CM | POA: Diagnosis not present

## 2019-04-08 DIAGNOSIS — E034 Atrophy of thyroid (acquired): Secondary | ICD-10-CM

## 2019-04-08 DIAGNOSIS — J449 Chronic obstructive pulmonary disease, unspecified: Secondary | ICD-10-CM

## 2019-04-08 DIAGNOSIS — D485 Neoplasm of uncertain behavior of skin: Secondary | ICD-10-CM | POA: Diagnosis not present

## 2019-04-08 DIAGNOSIS — G301 Alzheimer's disease with late onset: Secondary | ICD-10-CM

## 2019-04-08 NOTE — Progress Notes (Signed)
Subjective:  Patient ID: MAGHEN GROUP, female    DOB: 1936-01-13  Age: 83 y.o. MRN: [id number]  CC: No chief complaint on file.   HPI [name] presents for HTN, COPD, GERD C/o growing lesion L ear  Outpatient Medications Prior to Visit  Medication Sig Dispense Refill  . acetaminophen (TYLENOL) 325 MG tablet Take 650 mg by mouth every 6 (six) hours as needed.    Marland Kitchen aspirin 81 MG EC tablet Take 81 mg by mouth daily.     . Cholecalciferol (VITAMIN D3) 2000 units capsule Take 1 capsule (2,000 Units total) daily by mouth. 100 capsule 3  . Diclofenac Sodium (PENNSAID) 1.5 % SOLN Place 3-5 drops onto the skin 3 (three) times daily as needed. For pain applies on joints    . donepezil (ARICEPT) 10 MG tablet TAKE 1 TABLET AT BEDTIME 90 tablet 4  . furosemide (LASIX) 20 MG tablet TAKE 2 TABLETS DAILY 180 tablet 4  . losartan (COZAAR) 50 MG tablet TAKE 1 TABLET TWICE A DAY 180 tablet 3  . memantine (NAMENDA) 10 MG tablet TAKE 1 TABLET TWICE A DAY 180 tablet 3  . Multiple Vitamin (MULTIVITAMIN) tablet Take 1 tablet by mouth daily.    Marland Kitchen omeprazole (PRILOSEC) 40 MG capsule TAKE 1 CAPSULE DAILY 90 capsule 3  . ondansetron (ZOFRAN) 4 MG tablet Take 1 tablet (4 mg total) by mouth every 8 (eight) hours as needed. Take  1 tab every 6 hours as needed for nausea and vomiting. 60 tablet 1  . simvastatin (ZOCOR) 40 MG tablet TAKE 1 TABLET AT BEDTIME 90 tablet 3  . SYNTHROID 50 MCG tablet TAKE 1 TABLET DAILY 90 tablet 3  . umeclidinium bromide (INCRUSE ELLIPTA) 62.5 MCG/INH AEPB Inhale 1 puff into the lungs daily. 90 each 3   No facility-administered medications prior to visit.     ROS: Review of Systems  Constitutional: Positive for fatigue. Negative for activity change, appetite change, chills and unexpected weight change.  HENT: Negative for congestion, mouth sores and sinus pressure.   Eyes: Negative for visual disturbance.  Respiratory: Negative for cough and chest tightness.    Gastrointestinal: Negative for abdominal pain and nausea.  Genitourinary: Negative for difficulty urinating, frequency and vaginal pain.  Musculoskeletal: Negative for back pain and gait problem.  Skin: Positive for color change and wound. Negative for pallor and rash.  Neurological: Negative for dizziness, tremors, weakness, numbness and headaches.  Psychiatric/Behavioral: Positive for confusion and decreased concentration. Negative for sleep disturbance and suicidal ideas. The patient is not nervous/anxious.     Objective:  BP 110/66 (BP Location: Left Arm, Patient Position: Sitting, Cuff Size: Normal)   Pulse 93   Temp 97.9 F (36.6 C) (Oral)   Ht 5\' 2"  (1.575 m)   Wt 137 lb (62.1 kg)   SpO2 91%   BMI 25.06 kg/m   BP Readings from Last 3 Encounters:  04/08/19 110/66  01/07/19 108/64  09/03/18 108/66    Wt Readings from Last 3 Encounters:  04/08/19 137 lb (62.1 kg)  01/07/19 136 lb 1.3 oz (61.7 kg)  09/03/18 135 lb (61.2 kg)    Physical Exam Constitutional:      General: She is not in acute distress.    Appearance: She is well-developed.  HENT:     Head: Normocephalic.     Right Ear: External ear normal.     Left Ear: External ear normal.     Nose: Nose normal.  Eyes:  General:        Right eye: No discharge.        Left eye: No discharge.     Conjunctiva/sclera: Conjunctivae normal.     Pupils: Pupils are equal, round, and reactive to light.  Neck:     Musculoskeletal: Normal range of motion and neck supple.     Thyroid: No thyromegaly.     Vascular: No JVD.     Trachea: No tracheal deviation.  Cardiovascular:     Rate and Rhythm: Normal rate and regular rhythm.     Heart sounds: Normal heart sounds.  Pulmonary:     Effort: No respiratory distress.     Breath sounds: No stridor. No wheezing.  Abdominal:     General: Bowel sounds are normal. There is no distension.     Palpations: Abdomen is soft. There is no mass.     Tenderness: There is no  abdominal tenderness. There is no guarding or rebound.  Musculoskeletal:        General: No tenderness.  Lymphadenopathy:     Cervical: No cervical adenopathy.  Skin:    Findings: No erythema or rash.  Neurological:     Cranial Nerves: No cranial nerve deficit.     Motor: No abnormal muscle tone.     Coordination: Coordination normal.     Deep Tendon Reflexes: Reflexes normal.  Psychiatric:        Behavior: Behavior normal.        Thought Content: Thought content normal.        Judgment: Judgment normal.     growing brown lesion L ear Alert, cooperative  Lab Results  Component Value Date   WBC 7.6 05/22/2018   HGB 15.2 (H) 05/22/2018   HCT 45.1 05/22/2018   PLT 145.0 (L) 05/22/2018   GLUCOSE 189 (H) 01/07/2019   CHOL 155 11/15/2017   TRIG 206.0 (H) 11/15/2017   HDL 40.30 11/15/2017   LDLDIRECT 90.0 11/15/2017   LDLCALC 70 12/16/2013   ALT 12 01/03/2017   AST 16 01/03/2017   NA 140 01/07/2019   K 3.4 (L) 01/07/2019   CL 101 01/07/2019   CREATININE 1.06 01/07/2019   BUN 16 01/07/2019   CO2 29 01/07/2019   TSH 1.50 01/07/2019   INR 1.15 08/10/2011   HGBA1C 5.8 10/12/2011    Dexascan  Result Date: 06/27/2017 EXAM: DUAL X-RAY ABSORPTIOMETRY (DXA) FOR BONE MINERAL DENSITY IMPRESSION: Referring Physician:  Cassandria Anger PATIENT: Name: [name] Patient ID: [id number] Birth Date: 1936/07/22 Height: 61.8 in. Sex: Female Measured: 06/27/2017 Weight: 136.3 lbs. Indications: Advanced Age, Estrogen Deficient, Hypothyroid, Hysterectomy, Postmenopausal, Synthroid, Tobacco User (Current Smoker) Fractures: None Treatments: Multivitamin ASSESSMENT: The BMD measured at Femur Total Left is 0.691 g/cm2 with a T-score of -2.5. This patient is considered osteoporotic according to [facility]) criteria. There has been a statistically significant decrease in BMD of Left hip, and no statistically significant change in BMD of Lumbar Spine since prior exam  dated 11/20/2002. Site Region Measured Date Measured Age YA BMD Significant CHANGE T-score DualFemur Total Left 06/27/2017 79.5 -2.5 0.691 g/cm2 * AP Spine  L1-L4      8/27/2017    79.5         -2.3    0.912 g/cm2 World Health Organization [facility]) criteria for post-menopausal, Caucasian Women: Normal       T-score at or above -1 SD Osteopenia   T-score between -1 and -2.5 SD Osteoporosis T-score at or below -2.5 SD  RECOMMENDATION: National Osteoporosis Foundation recommends that FDA-approved medical therapies be considered in postmenopausal women and men age 12 or older with a: 1. Hip or vertebral (clinical or morphometric) fracture. 2. T-score of <-2.5 at the spine or hip. 3. Ten-year fracture probability by FRAX of 3% or greater for hip fracture or 20% or greater for major osteoporotic fracture. All treatment decisions require clinical judgment and consideration of individual patient factors, including patient preferences, co-morbidities, previous drug use, risk factors not captured in the FRAX model (e.g. falls, vitamin D deficiency, increased bone turnover, interval significant decline in bone density) and possible under - or over-estimation of fracture risk by FRAX. All patients should ensure an adequate intake of dietary calcium (1200 mg/d) and vitamin D (800 IU daily) unless contraindicated. FOLLOW-UP: People with diagnosed cases of osteoporosis or at high risk for fracture should have regular bone mineral density tests. For patients eligible for Medicare, routine testing is allowed once every 2 years. The testing frequency can be increased to one year for patients who have rapidly progressing disease, those who are receiving or discontinuing medical therapy to restore bone mass, or have additional risk factors. I have reviewed this report, and agree with the above findings. [facility] Electronically Signed   By: Rolm Baptise M.D.   On: 06/27/2017 13:58    Assessment & Plan:   There are no  diagnoses linked to this encounter.   No orders of the defined types were placed in this encounter.    Follow-up: No follow-ups on file.  [name], MD

## 2019-04-08 NOTE — Assessment & Plan Note (Signed)
Aricept and Namenda

## 2019-04-08 NOTE — Assessment & Plan Note (Signed)
growing lesion L ear ENT ref

## 2019-04-08 NOTE — Assessment & Plan Note (Signed)
Levothroid 

## 2019-04-08 NOTE — Assessment & Plan Note (Signed)
Refractory  

## 2019-04-08 NOTE — Assessment & Plan Note (Signed)
Incruse ellipta

## 2019-04-15 NOTE — Progress Notes (Signed)
NEUROLOGY FOLLOW UP OFFICE NOTE  [name] [id number]  HISTORY OF PRESENT ILLNESS: [name] is an 83 year old right-handed female with GERD, peripheral vascular disease, hyperlipidemia, COPD, hypertension, hypothyroidism, and anxiety who follows up for Alzheimer's disease.  She is accompanied by her husband who supplements history.  UPDATE: She is taking Aricept 10mg  daily and Namenda 10mg  twice daily. Memory loss has slightly progressed.  She has trouble with some names.  She is able to dress, bathe and use the toilet herself.  She doesn't keep the house tidy as she used to.  She does not cook or drive.  She sleeps often. She does not get agitated or combative.  She is not hallucinating or delusional.  She sleeps well.  She denies depression.   HISTORY: Her husband began to notice changes in memory around 2012 or 2013.She would have problems with short-term memory.She would forget about phone calls.She would repeat questions. Her husband usually goes to the grocery store.She has no difficulty remembering names or faces.She has no hallucinations or delusions.She sleeps well.She is not depressed.There has been no change in personality or behavior.She is able to perform all her ADLs.She has gotten lost while driving.During the day, she tends to the house but spends time watching TV.She reads the Bible but not everyday.She and her husband are active in the church.They go to choir practice every Wednesday night.They participate in a senior group once a month.Her mother and maternal aunt had dementia.  She has history of nausea and mild anorexia.No vomiting.She had been on both Aricept and rivastigmine at one time and were discontinued with concern that it was causing the nausea.However, the nausea never resolved.She has been evaluated by GI with no clear etiology.She was subsequently restarted on Aricept and then Namenda.   11/05/13 CT HEAD:no bleed, mass lesions or acute infarcts seen.Mild atrophy and atherosclerotic changes noted.  PAST MEDICAL HISTORY: Past Medical History:  Diagnosis Date  . Anxiety   . COPD (chronic obstructive pulmonary disease) ([place])   . Dementia ([place])   . Diverticulosis of colon (without mention of hemorrhage)   . Gallstones   . GERD (gastroesophageal reflux disease)   . Hiatal hernia   . Hyperlipidemia   . Hypertension   . Hypothyroidism   . Low back pain    OA R radiculopathy  . Lower extremity edema    chronic  . Osteopenia   . Peptic ulcer   . Peripheral vascular disease ([place])   . Personal history of colonic polyps 2001   TUBULAR ADENOMA - [name]  . Pneumonia     MEDICATIONS: Current Outpatient Medications on File Prior to Visit  Medication Sig Dispense Refill  . acetaminophen (TYLENOL) 325 MG tablet Take 650 mg by mouth every 6 (six) hours as needed.    Marland Kitchen aspirin 81 MG EC tablet Take 81 mg by mouth daily.     . Cholecalciferol (VITAMIN D3) 2000 units capsule Take 1 capsule (2,000 Units total) daily by mouth. 100 capsule 3  . Diclofenac Sodium (PENNSAID) 1.5 % SOLN Place 3-5 drops onto the skin 3 (three) times daily as needed. For pain applies on joints    . donepezil (ARICEPT) 10 MG tablet TAKE 1 TABLET AT BEDTIME 90 tablet 4  . furosemide (LASIX) 20 MG tablet TAKE 2 TABLETS DAILY 180 tablet 4  . losartan (COZAAR) 50 MG tablet TAKE 1 TABLET TWICE A DAY 180 tablet 3  . memantine (NAMENDA) 10 MG tablet TAKE 1 TABLET TWICE  A DAY 180 tablet 3  . Multiple Vitamin (MULTIVITAMIN) tablet Take 1 tablet by mouth daily.    Marland Kitchen omeprazole (PRILOSEC) 40 MG capsule TAKE 1 CAPSULE DAILY 90 capsule 3  . ondansetron (ZOFRAN) 4 MG tablet Take 1 tablet (4 mg total) by mouth every 8 (eight) hours as needed. Take  1 tab every 6 hours as needed for nausea and vomiting. 60 tablet 1  . simvastatin (ZOCOR) 40 MG tablet TAKE 1 TABLET AT BEDTIME 90 tablet 3  . SYNTHROID 50 MCG tablet TAKE 1  TABLET DAILY 90 tablet 3  . umeclidinium bromide (INCRUSE ELLIPTA) 62.5 MCG/INH AEPB Inhale 1 puff into the lungs daily. 90 each 3   No current facility-administered medications on file prior to visit.     ALLERGIES: Allergies  Allergen Reactions  . Codeine Sulfate Other (See Comments)    Extreme headaches  . Exelon [Rivastigmine Tartrate]     nausea  . Oxycodone-Acetaminophen Other (See Comments)    Extreme headaches    FAMILY HISTORY: Family History  Problem Relation Age of Onset  . Dementia Mother   . Hypertension Father   . Heart disease Father   . Colon cancer Sister   . Heart disease Sister   . Esophageal cancer Neg Hx   . Rectal cancer Neg Hx   . Stomach cancer Neg Hx    SOCIAL HISTORY: Social History   Socioeconomic History  . Marital status: Married    Spouse name: Not on file  . Number of children: 4  . Years of education: Not on file  . Highest education level: Not on file  Occupational History  . Occupation: retired  Scientific laboratory technician  . Financial resource strain: Not hard at all  . Food insecurity    Worry: Never true    Inability: Never true  . Transportation needs    Medical: No    Non-medical: No  Tobacco Use  . Smoking status: Current Every Day Smoker    Packs/day: 1.50    Years: 61.00    Pack years: 91.50    Types: Cigarettes  . Smokeless tobacco: Never Used  . Tobacco comment: Sheet given on smoking  Substance and Sexual Activity  . Alcohol use: No    Alcohol/week: 0.0 standard drinks  . Drug use: No  . Sexual activity: Never    Partners: Male  Lifestyle  . Physical activity    Days per week: 0 days    Minutes per session: 0 min  . Stress: Not at all  Relationships  . Social connections    Talks on phone: More than three times a week    Gets together: More than three times a week    Attends religious service: More than 4 times per year    Active member of club or organization: Yes    Attends meetings of clubs or organizations:  More than 4 times per year    Relationship status: Married  . Intimate partner violence    Fear of current or ex partner: No    Emotionally abused: No    Physically abused: No    Forced sexual activity: No  Other Topics Concern  . Not on file  Social History Narrative  . Not on file    REVIEW OF SYSTEMS: Constitutional: No fevers, chills, or sweats, no generalized fatigue, change in appetite Eyes: No visual changes, double vision, eye pain Ear, nose and throat: No hearing loss, ear pain, nasal congestion, sore throat Cardiovascular: No chest  pain, palpitations Respiratory:  No shortness of breath at rest or with exertion, wheezes GastrointestinaI: No nausea, vomiting, diarrhea, abdominal pain, fecal incontinence Genitourinary:  No dysuria, urinary retention or frequency Musculoskeletal:  No neck pain, back pain Integumentary: No rash, pruritus, skin lesions Neurological: as above Psychiatric: No depression, insomnia, anxiety Endocrine: No palpitations, fatigue, diaphoresis, mood swings, change in appetite, change in weight, increased thirst Hematologic/Lymphatic:  No purpura, petechiae. Allergic/Immunologic: no itchy/runny eyes, nasal congestion, recent allergic reactions, rashes  PHYSICAL EXAM:  General: No acute distress.  Patient appears well-groomed.   Head:  Normocephalic/atraumatic Eyes:  Fundi examined but not visualized Neck: supple, no paraspinal tenderness, full range of motion Heart:  Regular rate and rhythm Lungs:  Clear to auscultation bilaterally Back: No paraspinal tenderness Neurological Exam: alert and oriented to person and place. Attention span and concentration impaired, recent memory impaired, remote memory intact, fund of knowledge impaired.  Speech fluent and not dysarthric, language intact.   MMSE - Mini Mental State Exam 04/16/2019 04/17/2018 04/16/2018 04/05/2017 03/15/2017 03/15/2016 06/22/2015  Not completed: - (No Data) - Refused - - -  Orientation to time  1 - 0 - 4 4 2   Orientation to Place 5 - 4 - 3 4 3   Registration 3 - 3 - 3 3 3   Attention/ Calculation 0 - 3 - 5 5 3   Recall 0 - 0 - 0 0 0  Language- name 2 objects 1 - 2 - 2 2 2   Language- repeat 0 - 1 - 1 1 1   Language- follow 3 step command 3 - 3 - 3 3 3   Language- read & follow direction 1 - 1 - 1 1 1   Write a sentence 1 - 1 - 1 1 1   Copy design 1 - 1 - 1 1 1   Total score 16 - 19 - 24 25 20    CN II-XII intact. Bulk and tone normal, muscle strength 5/5 throughout.  Sensation to light touch  intact.  Deep tendon reflexes 2+ throughout.  Finger to nose testing intact.  Gait normal  IMPRESSION: 1.  Alzheimer's disease 2.  Tobacco use disorder  PLAN: 1.  Donepezil 10mg  at bedtime and memantine 10mg  twice daily 2.  Continue remaining active:  Stay busy at home (such as housework).  Continue going out to restaurants and church 3.  Tobacco cessation counseling (CPT 99406):  Tobacco use with no history of CAD, stroke, or cancer  - Currently smoking 1.5 packs/day   - Patient was informed of the dangers of tobacco abuse including stroke, cancer, and MI, as well as benefits of tobacco cessation. - Patient is not willing to quit at this time. - Approximately 5 mins were spent counseling patient cessation techniques. We discussed various methods to help quit smoking, including deciding on a date to quit, joining a support group, pharmacological agents- nicotine gum/patch/lozenges, chantix.  - I will reassess her progress at the next follow-up visit  4.  Follow up in one year  25 minutes spent face to face with patient, 50% spent discussing diagnosis.  Metta Clines, DO  CC: Lew Dawes, MD

## 2019-04-16 ENCOUNTER — Encounter: Payer: Self-pay | Admitting: Neurology

## 2019-04-16 ENCOUNTER — Ambulatory Visit (INDEPENDENT_AMBULATORY_CARE_PROVIDER_SITE_OTHER): Payer: Medicare Other | Admitting: Neurology

## 2019-04-16 ENCOUNTER — Other Ambulatory Visit: Payer: Self-pay

## 2019-04-16 VITALS — BP 129/83 | HR 86 | Temp 98.8°F | Ht 62.0 in | Wt 138.0 lb

## 2019-04-16 DIAGNOSIS — F0280 Dementia in other diseases classified elsewhere without behavioral disturbance: Secondary | ICD-10-CM

## 2019-04-16 DIAGNOSIS — F17200 Nicotine dependence, unspecified, uncomplicated: Secondary | ICD-10-CM | POA: Diagnosis not present

## 2019-04-16 DIAGNOSIS — G301 Alzheimer's disease with late onset: Secondary | ICD-10-CM

## 2019-04-16 DIAGNOSIS — F17210 Nicotine dependence, cigarettes, uncomplicated: Secondary | ICD-10-CM | POA: Diagnosis not present

## 2019-04-16 NOTE — Patient Instructions (Signed)
Continue donepezil and memantine Follow up in one year

## 2019-04-21 ENCOUNTER — Ambulatory Visit: Payer: Medicare Other | Admitting: Neurology

## 2019-05-06 ENCOUNTER — Telehealth: Payer: Self-pay | Admitting: Internal Medicine

## 2019-05-06 NOTE — Telephone Encounter (Signed)
Patient will call office to schedule AWV. SF

## 2019-05-07 ENCOUNTER — Other Ambulatory Visit: Payer: Self-pay | Admitting: Otolaryngology

## 2019-05-07 DIAGNOSIS — L820 Inflamed seborrheic keratosis: Secondary | ICD-10-CM | POA: Diagnosis not present

## 2019-05-07 DIAGNOSIS — D485 Neoplasm of uncertain behavior of skin: Secondary | ICD-10-CM | POA: Diagnosis not present

## 2019-06-10 ENCOUNTER — Other Ambulatory Visit: Payer: Self-pay | Admitting: Internal Medicine

## 2019-06-25 ENCOUNTER — Other Ambulatory Visit: Payer: Self-pay

## 2019-06-25 ENCOUNTER — Ambulatory Visit (INDEPENDENT_AMBULATORY_CARE_PROVIDER_SITE_OTHER): Payer: Medicare Other

## 2019-06-25 DIAGNOSIS — M810 Age-related osteoporosis without current pathological fracture: Secondary | ICD-10-CM

## 2019-06-25 MED ORDER — DENOSUMAB 60 MG/ML ~~LOC~~ SOSY
60.0000 mg | PREFILLED_SYRINGE | Freq: Once | SUBCUTANEOUS | Status: AC
  Administered 2019-06-25: 60 mg via SUBCUTANEOUS

## 2019-07-09 ENCOUNTER — Other Ambulatory Visit: Payer: Self-pay

## 2019-07-09 ENCOUNTER — Encounter: Payer: Self-pay | Admitting: Internal Medicine

## 2019-07-09 ENCOUNTER — Ambulatory Visit (INDEPENDENT_AMBULATORY_CARE_PROVIDER_SITE_OTHER): Payer: Medicare Other | Admitting: Internal Medicine

## 2019-07-09 VITALS — BP 124/82 | HR 71 | Temp 97.9°F | Ht 62.0 in | Wt 138.0 lb

## 2019-07-09 DIAGNOSIS — G301 Alzheimer's disease with late onset: Secondary | ICD-10-CM | POA: Diagnosis not present

## 2019-07-09 DIAGNOSIS — I10 Essential (primary) hypertension: Secondary | ICD-10-CM

## 2019-07-09 DIAGNOSIS — Z23 Encounter for immunization: Secondary | ICD-10-CM

## 2019-07-09 DIAGNOSIS — J449 Chronic obstructive pulmonary disease, unspecified: Secondary | ICD-10-CM

## 2019-07-09 DIAGNOSIS — F0280 Dementia in other diseases classified elsewhere without behavioral disturbance: Secondary | ICD-10-CM

## 2019-07-09 NOTE — Addendum Note (Signed)
Addended by: Karren Cobble on: 07/09/2019 11:41 AM   Modules accepted: Orders

## 2019-07-09 NOTE — Patient Instructions (Signed)
Rice sock heating pad 

## 2019-07-09 NOTE — Assessment & Plan Note (Signed)
Doing fair 

## 2019-07-09 NOTE — Assessment & Plan Note (Signed)
Cont w/Aricept and Namenda po F/u w/Dr Tomi Likens

## 2019-07-09 NOTE — Assessment & Plan Note (Signed)
Losartan 

## 2019-07-09 NOTE — Progress Notes (Signed)
Subjective:  Patient ID: [name], female    DOB: 02-20-1936  Age: 83 y.o. MRN: [id number]  CC: No chief complaint on file.   HPI [name] presents for COPD, HTN, dementia f/u  Outpatient Medications Prior to Visit  Medication Sig Dispense Refill   acetaminophen (TYLENOL) 325 MG tablet Take 650 mg by mouth every 6 (six) hours as needed.     aspirin 81 MG EC tablet Take 81 mg by mouth daily.      Cholecalciferol (VITAMIN D3) 2000 units capsule Take 1 capsule (2,000 Units total) daily by mouth. 100 capsule 3   Diclofenac Sodium (PENNSAID) 1.5 % SOLN Place 3-5 drops onto the skin 3 (three) times daily as needed. For pain applies on joints     donepezil (ARICEPT) 10 MG tablet TAKE 1 TABLET AT BEDTIME 90 tablet 4   furosemide (LASIX) 20 MG tablet TAKE 2 TABLETS DAILY 180 tablet 4   INCRUSE ELLIPTA 62.5 MCG/INH AEPB USE 1 INHALATION DAILY 90 each 3   losartan (COZAAR) 50 MG tablet TAKE 1 TABLET TWICE A DAY 180 tablet 3   memantine (NAMENDA) 10 MG tablet TAKE 1 TABLET TWICE A DAY 180 tablet 3   Multiple Vitamin (MULTIVITAMIN) tablet Take 1 tablet by mouth daily.     omeprazole (PRILOSEC) 40 MG capsule TAKE 1 CAPSULE DAILY 90 capsule 3   ondansetron (ZOFRAN) 4 MG tablet Take 1 tablet (4 mg total) by mouth every 8 (eight) hours as needed. Take  1 tab every 6 hours as needed for nausea and vomiting. 60 tablet 1   simvastatin (ZOCOR) 40 MG tablet TAKE 1 TABLET AT BEDTIME 90 tablet 3   SYNTHROID 50 MCG tablet TAKE 1 TABLET DAILY 90 tablet 3   No facility-administered medications prior to visit.     ROS: Review of Systems  Constitutional: Negative for activity change, appetite change, chills, fatigue and unexpected weight change.  HENT: Negative for congestion, mouth sores and sinus pressure.   Eyes: Negative for visual disturbance.  Respiratory: Negative for cough and chest tightness.   Gastrointestinal: Negative for abdominal pain and nausea.    Genitourinary: Negative for difficulty urinating, frequency and vaginal pain.  Musculoskeletal: Positive for arthralgias. Negative for back pain and gait problem.  Skin: Negative for pallor and rash.  Neurological: Negative for dizziness, tremors, weakness, numbness and headaches.  Psychiatric/Behavioral: Positive for confusion. Negative for sleep disturbance and suicidal ideas.    Objective:  BP 124/82 (BP Location: Left Arm, Patient Position: Sitting, Cuff Size: Normal)    Pulse 71    Temp 97.9 F (36.6 C) (Oral)    Ht 5\' 2"  (1.575 m)    Wt 138 lb (62.6 kg)    SpO2 93%    BMI 25.24 kg/m   BP Readings from Last 3 Encounters:  07/09/19 124/82  04/16/19 129/83  04/08/19 110/66    Wt Readings from Last 3 Encounters:  07/09/19 138 lb (62.6 kg)  04/16/19 138 lb (62.6 kg)  04/08/19 137 lb (62.1 kg)    Physical Exam Constitutional:      General: She is not in acute distress.    Appearance: She is well-developed.  HENT:     Head: Normocephalic.     Right Ear: External ear normal.     Left Ear: External ear normal.     Nose: Nose normal.  Eyes:     General:        Right eye: No discharge.  Left eye: No discharge.     Conjunctiva/sclera: Conjunctivae normal.     Pupils: Pupils are equal, round, and reactive to light.  Neck:     Musculoskeletal: Normal range of motion and neck supple.     Thyroid: No thyromegaly.     Vascular: No JVD.     Trachea: No tracheal deviation.  Cardiovascular:     Rate and Rhythm: Normal rate and regular rhythm.     Heart sounds: Normal heart sounds.  Pulmonary:     Effort: No respiratory distress.     Breath sounds: No stridor. No wheezing.  Abdominal:     General: Bowel sounds are normal. There is no distension.     Palpations: Abdomen is soft. There is no mass.     Tenderness: There is no abdominal tenderness. There is no guarding or rebound.  Musculoskeletal:        General: No tenderness.  Lymphadenopathy:     Cervical: No  cervical adenopathy.  Skin:    Findings: No erythema or rash.  Neurological:     Mental Status: She is disoriented.     Cranial Nerves: No cranial nerve deficit.     Motor: No abnormal muscle tone.     Coordination: Coordination normal.     Deep Tendon Reflexes: Reflexes normal.  Psychiatric:        Behavior: Behavior normal.        Thought Content: Thought content normal.   OA B hands  Lab Results  Component Value Date   WBC 7.6 05/22/2018   HGB 15.2 (H) 05/22/2018   HCT 45.1 05/22/2018   PLT 145.0 (L) 05/22/2018   GLUCOSE 189 (H) 01/07/2019   CHOL 155 11/15/2017   TRIG 206.0 (H) 11/15/2017   HDL 40.30 11/15/2017   LDLDIRECT 90.0 11/15/2017   LDLCALC 70 12/16/2013   ALT 12 01/03/2017   AST 16 01/03/2017   NA 140 01/07/2019   K 3.4 (L) 01/07/2019   CL 101 01/07/2019   CREATININE 1.06 01/07/2019   BUN 16 01/07/2019   CO2 29 01/07/2019   TSH 1.50 01/07/2019   INR 1.15 08/10/2011   HGBA1C 5.8 10/12/2011    Dexascan  Result Date: 06/27/2017 EXAM: DUAL X-RAY ABSORPTIOMETRY (DXA) FOR BONE MINERAL DENSITY IMPRESSION: Referring Physician:  Cassandria Anger PATIENT: Name: [name], [name] Patient ID: [id number] Birth Date: [birth year] Height: 61.8 in. Sex: Female Measured: 06/27/2017 Weight: 136.3 lbs. Indications: Advanced Age, Estrogen Deficient, Hypothyroid, Hysterectomy, Postmenopausal, Synthroid, Tobacco User (Current Smoker) Fractures: None Treatments: Multivitamin ASSESSMENT: The BMD measured at Femur Total Left is 0.691 g/cm2 with a T-score of -2.5. This patient is considered osteoporotic according to [facility]) criteria. There has been a statistically significant decrease in BMD of Left hip, and no statistically significant change in BMD of Lumbar Spine since prior exam dated 11/20/2002. Site Region Measured Date Measured Age YA BMD Significant CHANGE T-score DualFemur Total Left 06/27/2017 79.5 -2.5 0.691 g/cm2 * AP Spine  L1-L4      8/27/2017     79.5         -2.3    0.912 g/cm2 World Health Organization [facility]) criteria for post-menopausal, Caucasian Women: Normal       T-score at or above -1 SD Osteopenia   T-score between -1 and -2.5 SD Osteoporosis T-score at or below -2.5 SD RECOMMENDATION: [place] recommends that FDA-approved medical therapies be considered in postmenopausal women and men age 65 or older with a: 1. Hip or vertebral (  clinical or morphometric) fracture. 2. T-score of <-2.5 at the spine or hip. 3. Ten-year fracture probability by FRAX of 3% or greater for hip fracture or 20% or greater for major osteoporotic fracture. All treatment decisions require clinical judgment and consideration of individual patient factors, including patient preferences, co-morbidities, previous drug use, risk factors not captured in the FRAX model (e.g. falls, vitamin D deficiency, increased bone turnover, interval significant decline in bone density) and possible under - or over-estimation of fracture risk by FRAX. All patients should ensure an adequate intake of dietary calcium (1200 mg/d) and vitamin D (800 IU daily) unless contraindicated. FOLLOW-UP: People with diagnosed cases of osteoporosis or at high risk for fracture should have regular bone mineral density tests. For patients eligible for Medicare, routine testing is allowed once every 2 years. The testing frequency can be increased to one year for patients who have rapidly progressing disease, those who are receiving or discontinuing medical therapy to restore bone mass, or have additional risk factors. I have reviewed this report, and agree with the above findings. [facility] Electronically Signed   By: Rolm Baptise M.D.   On: 06/27/2017 13:58    Assessment & Plan:   There are no diagnoses linked to this encounter.   No orders of the defined types were placed in this encounter.    Follow-up: No follow-ups on file.  [name], MD

## 2019-07-12 NOTE — Progress Notes (Signed)
Medical screening examination/treatment/procedure(s) were performed by non-physician practitioner and as supervising physician I was immediately available for consultation/collaboration. I agree with above. Aleksei Plotnikov, MD  

## 2019-09-30 ENCOUNTER — Other Ambulatory Visit: Payer: Self-pay | Admitting: Internal Medicine

## 2019-10-09 ENCOUNTER — Ambulatory Visit (INDEPENDENT_AMBULATORY_CARE_PROVIDER_SITE_OTHER): Payer: Medicare Other | Admitting: Internal Medicine

## 2019-10-09 ENCOUNTER — Other Ambulatory Visit: Payer: Self-pay

## 2019-10-09 ENCOUNTER — Encounter: Payer: Self-pay | Admitting: Internal Medicine

## 2019-10-09 VITALS — BP 126/68 | HR 83 | Temp 98.0°F | Ht 62.0 in | Wt 138.0 lb

## 2019-10-09 DIAGNOSIS — E785 Hyperlipidemia, unspecified: Secondary | ICD-10-CM

## 2019-10-09 DIAGNOSIS — I10 Essential (primary) hypertension: Secondary | ICD-10-CM | POA: Diagnosis not present

## 2019-10-09 DIAGNOSIS — I6523 Occlusion and stenosis of bilateral carotid arteries: Secondary | ICD-10-CM | POA: Diagnosis not present

## 2019-10-09 DIAGNOSIS — F17200 Nicotine dependence, unspecified, uncomplicated: Secondary | ICD-10-CM

## 2019-10-09 DIAGNOSIS — J449 Chronic obstructive pulmonary disease, unspecified: Secondary | ICD-10-CM | POA: Diagnosis not present

## 2019-10-09 DIAGNOSIS — R42 Dizziness and giddiness: Secondary | ICD-10-CM | POA: Insufficient documentation

## 2019-10-09 DIAGNOSIS — R7303 Prediabetes: Secondary | ICD-10-CM

## 2019-10-09 LAB — BASIC METABOLIC PANEL WITH GFR
BUN: 16 mg/dL (ref 6–23)
CO2: 33 meq/L — ABNORMAL HIGH (ref 19–32)
Calcium: 9.6 mg/dL (ref 8.4–10.5)
Chloride: 100 meq/L (ref 96–112)
Creatinine, Ser: 1.03 mg/dL (ref 0.40–1.20)
GFR: 51.32 mL/min — ABNORMAL LOW
Glucose, Bld: 183 mg/dL — ABNORMAL HIGH (ref 70–99)
Potassium: 3.7 meq/L (ref 3.5–5.1)
Sodium: 140 meq/L (ref 135–145)

## 2019-10-09 NOTE — Assessment & Plan Note (Signed)
Incruse ellipta Lungs are CTA today

## 2019-10-09 NOTE — Assessment & Plan Note (Signed)
Sporadic, orthostatic - reduce Losartan to 1/2 tab bid if worse

## 2019-10-09 NOTE — Assessment & Plan Note (Signed)
refractory

## 2019-10-09 NOTE — Progress Notes (Signed)
Subjective:  Patient ID: [name], female    DOB: Dec 06, 1935  Age: 83 y.o. MRN: [id number]  CC: No chief complaint on file.   HPI [name] presents for COPD, dementia, edema f/u  Outpatient Medications Prior to Visit  Medication Sig Dispense Refill  . acetaminophen (TYLENOL) 325 MG tablet Take 650 mg by mouth every 6 (six) hours as needed.    Marland Kitchen aspirin 81 MG EC tablet Take 81 mg by mouth daily.     . Cholecalciferol (VITAMIN D3) 2000 units capsule Take 1 capsule (2,000 Units total) daily by mouth. 100 capsule 3  . Diclofenac Sodium (PENNSAID) 1.5 % SOLN Place 3-5 drops onto the skin 3 (three) times daily as needed. For pain applies on joints    . donepezil (ARICEPT) 10 MG tablet TAKE 1 TABLET AT BEDTIME 90 tablet 4  . furosemide (LASIX) 20 MG tablet TAKE 2 TABLETS DAILY 180 tablet 4  . INCRUSE ELLIPTA 62.5 MCG/INH AEPB USE 1 INHALATION DAILY 90 each 3  . losartan (COZAAR) 50 MG tablet TAKE 1 TABLET TWICE A DAY 180 tablet 3  . memantine (NAMENDA) 10 MG tablet TAKE 1 TABLET TWICE A DAY 180 tablet 3  . Multiple Vitamin (MULTIVITAMIN) tablet Take 1 tablet by mouth daily.    Marland Kitchen omeprazole (PRILOSEC) 40 MG capsule TAKE 1 CAPSULE DAILY 90 capsule 3  . ondansetron (ZOFRAN) 4 MG tablet Take 1 tablet (4 mg total) by mouth every 8 (eight) hours as needed. Take  1 tab every 6 hours as needed for nausea and vomiting. 60 tablet 1  . simvastatin (ZOCOR) 40 MG tablet TAKE 1 TABLET AT BEDTIME 90 tablet 3  . SYNTHROID 50 MCG tablet TAKE 1 TABLET DAILY 90 tablet 3   No facility-administered medications prior to visit.    ROS: Review of Systems  Constitutional: Negative for activity change, appetite change, chills, fatigue and unexpected weight change.  HENT: Negative for congestion, mouth sores and sinus pressure.   Eyes: Negative for visual disturbance.  Respiratory: Negative for cough and chest tightness.   Gastrointestinal: Negative for abdominal pain and nausea.    Genitourinary: Negative for difficulty urinating, frequency and vaginal pain.  Musculoskeletal: Negative for back pain and gait problem.  Skin: Negative for pallor and rash.  Neurological: Positive for dizziness. Negative for tremors, weakness, numbness and headaches.  Psychiatric/Behavioral: Positive for decreased concentration. Negative for confusion, dysphoric mood, sleep disturbance and suicidal ideas. The patient is nervous/anxious.     Objective:  BP 126/68 (BP Location: Left Arm, Patient Position: Sitting, Cuff Size: Normal)   Pulse 83   Temp 98 F (36.7 C) (Oral)   Ht 5\' 2"  (1.575 m)   Wt 138 lb (62.6 kg)   SpO2 94%   BMI 25.24 kg/m   BP Readings from Last 3 Encounters:  10/09/19 126/68  07/09/19 124/82  04/16/19 129/83    Wt Readings from Last 3 Encounters:  10/09/19 138 lb (62.6 kg)  07/09/19 138 lb (62.6 kg)  04/16/19 138 lb (62.6 kg)    Physical Exam Constitutional:      General: She is not in acute distress.    Appearance: She is well-developed.  HENT:     Head: Normocephalic.     Right Ear: External ear normal.     Left Ear: External ear normal.     Nose: Nose normal.  Eyes:     General:        Right eye: No discharge.  Left eye: No discharge.     Conjunctiva/sclera: Conjunctivae normal.     Pupils: Pupils are equal, round, and reactive to light.  Neck:     Thyroid: No thyromegaly.     Vascular: No JVD.     Trachea: No tracheal deviation.  Cardiovascular:     Rate and Rhythm: Normal rate and regular rhythm.     Heart sounds: Normal heart sounds.  Pulmonary:     Effort: No respiratory distress.     Breath sounds: No stridor. No wheezing.  Abdominal:     General: Bowel sounds are normal. There is no distension.     Palpations: Abdomen is soft. There is no mass.     Tenderness: There is no abdominal tenderness. There is no guarding or rebound.  Musculoskeletal:        General: Tenderness present.     Cervical back: Normal range of motion  and neck supple.  Lymphadenopathy:     Cervical: No cervical adenopathy.  Skin:    Findings: No erythema or rash.  Neurological:     Cranial Nerves: No cranial nerve deficit.     Motor: No abnormal muscle tone.     Coordination: Coordination normal.     Gait: Gait abnormal.     Deep Tendon Reflexes: Reflexes normal.  Psychiatric:        Behavior: Behavior normal.        Thought Content: Thought content normal.        Judgment: Judgment normal.   lightheaded standing up  Lab Results  Component Value Date   WBC 7.6 05/22/2018   HGB 15.2 (H) 05/22/2018   HCT 45.1 05/22/2018   PLT 145.0 (L) 05/22/2018   GLUCOSE 189 (H) 01/07/2019   CHOL 155 11/15/2017   TRIG 206.0 (H) 11/15/2017   HDL 40.30 11/15/2017   LDLDIRECT 90.0 11/15/2017   LDLCALC 70 12/16/2013   ALT 12 01/03/2017   AST 16 01/03/2017   NA 140 01/07/2019   K 3.4 (L) 01/07/2019   CL 101 01/07/2019   CREATININE 1.06 01/07/2019   BUN 16 01/07/2019   CO2 29 01/07/2019   TSH 1.50 01/07/2019   INR 1.15 08/10/2011   HGBA1C 5.8 10/12/2011    DEXAScan  Result Date: 06/27/2017 EXAM: DUAL X-RAY ABSORPTIOMETRY (DXA) FOR BONE MINERAL DENSITY IMPRESSION: Referring Physician:  Cassandria Anger PATIENT: Name: [name], [name] Patient ID: [id number] Birth Date: 07-Oct-1936 Height: 61.8 in. Sex: Female Measured: 06/27/2017 Weight: 136.3 lbs. Indications: Advanced Age, Estrogen Deficient, Hypothyroid, Hysterectomy, Postmenopausal, Synthroid, Tobacco User (Current Smoker) Fractures: None Treatments: Multivitamin ASSESSMENT: The BMD measured at Femur Total Left is 0.691 g/cm2 with a T-score of -2.5. This patient is considered osteoporotic according to [facility]) criteria. There has been a statistically significant decrease in BMD of Left hip, and no statistically significant change in BMD of Lumbar Spine since prior exam dated 11/20/2002. Site Region Measured Date Measured Age YA BMD Significant CHANGE T-score  DualFemur Total Left 06[age]27/2017 79.5 -2.5 0.691 g/cm2 * AP Spine  L1-L4      8/27/2017    79.5         -2.3    0.912 g/cm2 World Health Organization [facility]) criteria for post-menopausal, Caucasian Women: Normal       T-score at or above -1 SD Osteopenia   T-score between -1 and -2.5 SD Osteoporosis T-score at or below -2.5 SD RECOMMENDATION: [place] recommends that FDA-approved medical therapies be considered in postmenopausal women and men age  3 or older with a: 1. Hip or vertebral (clinical or morphometric) fracture. 2. T-score of <-2.5 at the spine or hip. 3. Ten-year fracture probability by FRAX of 3% or greater for hip fracture or 20% or greater for major osteoporotic fracture. All treatment decisions require clinical judgment and consideration of individual patient factors, including patient preferences, co-morbidities, previous drug use, risk factors not captured in the FRAX model (e.g. falls, vitamin D deficiency, increased bone turnover, interval significant decline in bone density) and possible under - or over-estimation of fracture risk by FRAX. All patients should ensure an adequate intake of dietary calcium (1200 mg/d) and vitamin D (800 IU daily) unless contraindicated. FOLLOW-UP: People with diagnosed cases of osteoporosis or at high risk for fracture should have regular bone mineral density tests. For patients eligible for Medicare, routine testing is allowed once every 2 years. The testing frequency can be increased to one year for patients who have rapidly progressing disease, those who are receiving or discontinuing medical therapy to restore bone mass, or have additional risk factors. I have reviewed this report, and agree with the above findings. [facility] Electronically Signed   By: Rolm Baptise M.D.   On: 06/27/2017 13:58    Assessment & Plan:   There are no diagnoses linked to this encounter.   No orders of the defined types were placed in this  encounter.    Follow-up: No follow-ups on file.  [name], MD

## 2019-10-09 NOTE — Assessment & Plan Note (Signed)
Simvastatin, ASA 

## 2019-10-09 NOTE — Assessment & Plan Note (Signed)
On Simvastatin, ASA

## 2019-10-11 NOTE — Assessment & Plan Note (Signed)
Labs

## 2019-10-23 ENCOUNTER — Other Ambulatory Visit: Payer: Self-pay | Admitting: Internal Medicine

## 2019-11-03 ENCOUNTER — Ambulatory Visit: Payer: Medicare Other | Attending: Internal Medicine

## 2019-11-03 DIAGNOSIS — Z23 Encounter for immunization: Secondary | ICD-10-CM

## 2019-11-03 NOTE — Progress Notes (Signed)
   Covid-19 Vaccination Clinic  Name:  [name]    MRN: [id number] DOB: April 03, 1936  11/03/2019  [name] was observed post Covid-19 immunization for 15 minutes without incidence. She was provided with Vaccine Information Sheet and instruction to access the V-Safe system.   [name] was instructed to call 911 with any severe reactions post vaccine: Marland Kitchen Difficulty breathing  . Swelling of your face and throat  . A fast heartbeat  . A bad rash all over your body  . Dizziness and weakness    Immunizations Administered    Name Date Dose VIS Date Route   Pfizer COVID-19 Vaccine 11/03/2019  9:28 AM 0.3 mL 09/19/2019 Intramuscular   Manufacturer: [place]   Lot: [id number]   [place]: [id number]

## 2019-11-24 ENCOUNTER — Ambulatory Visit: Payer: Medicare Other | Attending: Internal Medicine

## 2019-11-24 DIAGNOSIS — Z23 Encounter for immunization: Secondary | ICD-10-CM | POA: Insufficient documentation

## 2019-11-24 NOTE — Progress Notes (Signed)
   Covid-19 Vaccination Clinic  Name:  [name]    MRN: [id number] DOB: 1935-11-21  11/24/2019  [name] was observed post Covid-19 immunization for 15 minutes without incidence. She was provided with Vaccine Information Sheet and instruction to access the V-Safe system.   [name] was instructed to call 911 with any severe reactions post vaccine: Marland Kitchen Difficulty breathing  . Swelling of your face and throat  . A fast heartbeat  . A bad rash all over your body  . Dizziness and weakness    Immunizations Administered    Name Date Dose VIS Date Route   Pfizer COVID-19 Vaccine 11/24/2019  9:31 AM 0.3 mL 09/19/2019 Intramuscular   Manufacturer: [place]   Lot: [id number]   [place]: [id number]

## 2019-12-25 ENCOUNTER — Other Ambulatory Visit: Payer: Self-pay | Admitting: Internal Medicine

## 2020-01-21 ENCOUNTER — Other Ambulatory Visit: Payer: Self-pay | Admitting: Internal Medicine

## 2020-01-29 ENCOUNTER — Ambulatory Visit (INDEPENDENT_AMBULATORY_CARE_PROVIDER_SITE_OTHER): Payer: Medicare Other | Admitting: Internal Medicine

## 2020-01-29 ENCOUNTER — Encounter: Payer: Self-pay | Admitting: Internal Medicine

## 2020-01-29 ENCOUNTER — Other Ambulatory Visit: Payer: Self-pay

## 2020-01-29 VITALS — BP 126/72 | HR 73 | Temp 98.1°F | Ht 62.0 in | Wt 136.0 lb

## 2020-01-29 DIAGNOSIS — R5383 Other fatigue: Secondary | ICD-10-CM

## 2020-01-29 DIAGNOSIS — I10 Essential (primary) hypertension: Secondary | ICD-10-CM | POA: Diagnosis not present

## 2020-01-29 DIAGNOSIS — R609 Edema, unspecified: Secondary | ICD-10-CM | POA: Diagnosis not present

## 2020-01-29 DIAGNOSIS — R634 Abnormal weight loss: Secondary | ICD-10-CM | POA: Diagnosis not present

## 2020-01-29 DIAGNOSIS — E034 Atrophy of thyroid (acquired): Secondary | ICD-10-CM

## 2020-01-29 DIAGNOSIS — I6523 Occlusion and stenosis of bilateral carotid arteries: Secondary | ICD-10-CM

## 2020-01-29 MED ORDER — LOSARTAN POTASSIUM 50 MG PO TABS: 50.0000 mg | ORAL_TABLET | Freq: Two times a day (BID) | ORAL | 3 refills | Status: DC

## 2020-01-29 MED ORDER — SIMVASTATIN 40 MG PO TABS: 40.0000 mg | ORAL_TABLET | Freq: Every day | ORAL | 3 refills | Status: DC

## 2020-01-29 NOTE — Assessment & Plan Note (Signed)
Levothroid 

## 2020-01-29 NOTE — Assessment & Plan Note (Signed)
Wt Readings from Last 3 Encounters:  01/29/20 136 lb (61.7 kg)  10/09/19 138 lb (62.6 kg)  07/09/19 138 lb (62.6 kg)

## 2020-01-29 NOTE — Assessment & Plan Note (Signed)
Simvastatin, ASA 

## 2020-01-29 NOTE — Assessment & Plan Note (Signed)
Stable  Labs in 6 mo

## 2020-01-29 NOTE — Assessment & Plan Note (Signed)
Stable Labs in 6 mo

## 2020-01-29 NOTE — Assessment & Plan Note (Signed)
Losartan bid

## 2020-01-29 NOTE — Progress Notes (Signed)
Subjective:  Patient ID: [name], female    DOB: 07-14-1936  Age: 84 y.o. MRN: [id number]  CC: No chief complaint on file.   HPI Analeese Roubideaux Canty presents for HTN, dyslipidemia, dementia f/u  Outpatient Medications Prior to Visit  Medication Sig Dispense Refill  . acetaminophen (TYLENOL) 325 MG tablet Take 650 mg by mouth every 6 (six) hours as needed.    Marland Kitchen aspirin 81 MG EC tablet Take 81 mg by mouth daily.     . Cholecalciferol (VITAMIN D3) 2000 units capsule Take 1 capsule (2,000 Units total) daily by mouth. 100 capsule 3  . Diclofenac Sodium (PENNSAID) 1.5 % SOLN Place 3-5 drops onto the skin 3 (three) times daily as needed. For pain applies on joints    . donepezil (ARICEPT) 10 MG tablet TAKE 1 TABLET AT BEDTIME 90 tablet 4  . furosemide (LASIX) 20 MG tablet TAKE 2 TABLETS DAILY 180 tablet 4  . INCRUSE ELLIPTA 62.5 MCG/INH AEPB USE 1 INHALATION DAILY 90 each 3  . losartan (COZAAR) 50 MG tablet TAKE 1 TABLET TWICE A DAY (ANNUAL APPOINTMENT IS DUE WITH LABS, MUST SEE PROVIDER FOR FUTURE REFILLS) 180 tablet 3  . memantine (NAMENDA) 10 MG tablet TAKE 1 TABLET TWICE A DAY 180 tablet 3  . Multiple Vitamin (MULTIVITAMIN) tablet Take 1 tablet by mouth daily.    Marland Kitchen omeprazole (PRILOSEC) 40 MG capsule TAKE 1 CAPSULE DAILY 90 capsule 3  . ondansetron (ZOFRAN) 4 MG tablet Take 1 tablet (4 mg total) by mouth every 8 (eight) hours as needed. Take  1 tab every 6 hours as needed for nausea and vomiting. 60 tablet 1  . simvastatin (ZOCOR) 40 MG tablet TAKE 1 TABLET AT BEDTIME (ANNUAL APPOINTMENT IS DUE WITH LABS, MUST SEE PROVIDER FOR FUTURE REFILLS) 90 tablet 3  . SYNTHROID 50 MCG tablet TAKE 1 TABLET DAILY 90 tablet 3   No facility-administered medications prior to visit.    ROS: Review of Systems  Constitutional: Negative for activity change, appetite change, chills, fatigue and unexpected weight change.  HENT: Negative for congestion, mouth sores and sinus pressure.     Eyes: Negative for visual disturbance.  Respiratory: Negative for cough and chest tightness.   Gastrointestinal: Negative for abdominal pain and nausea.  Genitourinary: Negative for difficulty urinating, frequency and vaginal pain.  Musculoskeletal: Negative for back pain and gait problem.  Skin: Negative for pallor and rash.  Neurological: Negative for dizziness, tremors, weakness, numbness and headaches.  Psychiatric/Behavioral: Positive for confusion and decreased concentration. Negative for sleep disturbance.    Objective:  BP 126/72 (BP Location: Right Arm, Patient Position: Sitting, Cuff Size: Normal)   Pulse 73   Temp 98.1 F (36.7 C) (Oral)   Ht 5\' 2"  (1.575 m)   Wt 136 lb (61.7 kg)   SpO2 93%   BMI 24.87 kg/m   BP Readings from Last 3 Encounters:  01/29/20 126/72  10/09/19 126/68  07/09/19 124/82    Wt Readings from Last 3 Encounters:  01/29/20 136 lb (61.7 kg)  10/09/19 138 lb (62.6 kg)  07/09/19 138 lb (62.6 kg)    Physical Exam Constitutional:      General: She is not in acute distress.    Appearance: She is well-developed.  HENT:     Head: Normocephalic.     Right Ear: External ear normal.     Left Ear: External ear normal.     Nose: Nose normal.  Eyes:     General:  Right eye: No discharge.        Left eye: No discharge.     Conjunctiva/sclera: Conjunctivae normal.     Pupils: Pupils are equal, round, and reactive to light.  Neck:     Thyroid: No thyromegaly.     Vascular: No JVD.     Trachea: No tracheal deviation.  Cardiovascular:     Rate and Rhythm: Normal rate and regular rhythm.     Heart sounds: Normal heart sounds.  Pulmonary:     Effort: No respiratory distress.     Breath sounds: No stridor. No wheezing.  Abdominal:     General: Bowel sounds are normal. There is no distension.     Palpations: Abdomen is soft. There is no mass.     Tenderness: There is no abdominal tenderness. There is no guarding or rebound.   Musculoskeletal:        General: No tenderness.     Cervical back: Normal range of motion and neck supple.  Lymphadenopathy:     Cervical: No cervical adenopathy.  Skin:    Findings: No erythema or rash.  Neurological:     Mental Status: She is disoriented.     Cranial Nerves: No cranial nerve deficit.     Motor: No abnormal muscle tone.     Coordination: Coordination normal.     Deep Tendon Reflexes: Reflexes normal.  Psychiatric:        Behavior: Behavior normal.        Thought Content: Thought content normal.        Judgment: Judgment normal.     Lab Results  Component Value Date   WBC 7.6 05/22/2018   HGB 15.2 (H) 05/22/2018   HCT 45.1 05/22/2018   PLT 145.0 (L) 05/22/2018   GLUCOSE 183 (H) 10/09/2019   CHOL 155 11/15/2017   TRIG 206.0 (H) 11/15/2017   HDL 40.30 11/15/2017   LDLDIRECT 90.0 11/15/2017   LDLCALC 70 12/16/2013   ALT 12 01/03/2017   AST 16 01/03/2017   NA 140 10/09/2019   K 3.7 10/09/2019   CL 100 10/09/2019   CREATININE 1.03 10/09/2019   BUN 16 10/09/2019   CO2 33 (H) 10/09/2019   TSH 1.50 01/07/2019   INR 1.15 08/10/2011   HGBA1C 5.8 10/12/2011    DEXAScan  Result Date: 06/27/2017 EXAM: DUAL X-RAY ABSORPTIOMETRY (DXA) FOR BONE MINERAL DENSITY IMPRESSION: Referring Physician:  Cassandria Anger PATIENT: Name: [name] Patient ID: [id number] Birth Date: June 26, 1936 Height: 61.8 in. Sex: Female Measured: 06/27/2017 Weight: 136.3 lbs. Indications: Advanced Age, Estrogen Deficient, Hypothyroid, Hysterectomy, Postmenopausal, Synthroid, Tobacco User (Current Smoker) Fractures: None Treatments: Multivitamin ASSESSMENT: The BMD measured at Femur Total Left is 0.691 g/cm2 with a T-score of -2.5. This patient is considered osteoporotic according to [facility]) criteria. There has been a statistically significant decrease in BMD of Left hip, and no statistically significant change in BMD of Lumbar Spine since prior exam dated  11/20/2002. Site Region Measured Date Measured Age YA BMD Significant CHANGE T-score DualFemur Total Left 06/27/2017 79.5 -2.5 0.691 g/cm2 * AP Spine  L1-L4      8/27/2017    79.5         -2.3    0.912 g/cm2 World Health Organization [facility]) criteria for post-menopausal, Caucasian Women: Normal       T-score at or above -1 SD Osteopenia   T-score between -1 and -2.5 SD Osteoporosis T-score at or below -2.5 SD RECOMMENDATION: [place] recommends that  FDA-approved medical therapies be considered in postmenopausal women and men age 34 or older with a: 1. Hip or vertebral (clinical or morphometric) fracture. 2. T-score of <-2.5 at the spine or hip. 3. Ten-year fracture probability by FRAX of 3% or greater for hip fracture or 20% or greater for major osteoporotic fracture. All treatment decisions require clinical judgment and consideration of individual patient factors, including patient preferences, co-morbidities, previous drug use, risk factors not captured in the FRAX model (e.g. falls, vitamin D deficiency, increased bone turnover, interval significant decline in bone density) and possible under - or over-estimation of fracture risk by FRAX. All patients should ensure an adequate intake of dietary calcium (1200 mg/d) and vitamin D (800 IU daily) unless contraindicated. FOLLOW-UP: People with diagnosed cases of osteoporosis or at high risk for fracture should have regular bone mineral density tests. For patients eligible for Medicare, routine testing is allowed once every 2 years. The testing frequency can be increased to one year for patients who have rapidly progressing disease, those who are receiving or discontinuing medical therapy to restore bone mass, or have additional risk factors. I have reviewed this report, and agree with the above findings. [facility] Radiology Electronically Signed   By: Rolm Baptise M.D.   On: 06/27/2017 13:58    Assessment & Plan:   There are no diagnoses  linked to this encounter.   No orders of the defined types were placed in this encounter.    Follow-up: No follow-ups on file.  [name], MD

## 2020-01-30 ENCOUNTER — Telehealth: Payer: Self-pay

## 2020-01-30 NOTE — Telephone Encounter (Signed)
New message   Seen on 4.22.2021  Husband calling need clarification on simvastatin (ZOCOR) 40 MG tablet directions .

## 2020-01-30 NOTE — Telephone Encounter (Signed)
Spouse notified med is to be taken once a day, doesn't matter when

## 2020-02-03 ENCOUNTER — Other Ambulatory Visit: Payer: Self-pay | Admitting: Internal Medicine

## 2020-02-12 ENCOUNTER — Other Ambulatory Visit: Payer: Self-pay

## 2020-02-12 ENCOUNTER — Ambulatory Visit (INDEPENDENT_AMBULATORY_CARE_PROVIDER_SITE_OTHER): Payer: Medicare Other | Admitting: *Deleted

## 2020-02-12 DIAGNOSIS — M810 Age-related osteoporosis without current pathological fracture: Secondary | ICD-10-CM

## 2020-02-12 MED ORDER — DENOSUMAB 60 MG/ML ~~LOC~~ SOSY
60.0000 mg | PREFILLED_SYRINGE | Freq: Once | SUBCUTANEOUS | Status: AC
Start: 2020-02-12 — End: 2020-02-12
  Administered 2020-02-12: 60 mg via SUBCUTANEOUS

## 2020-02-12 NOTE — Progress Notes (Signed)
Patient ID: [name], female   DOB: [birth year], 84 y.o.   MRN: [id number] Medical treatment/procedure(s) were performed by non-physician practitioner and as supervising physician I was immediately available for consultation/collaboration. I agree with above. Hoyt Koch, MD

## 2020-02-12 NOTE — Progress Notes (Signed)
Pls cosign for prolia inj since PCP is out of the office.Marland KitchenJohny Chess

## 2020-03-03 DIAGNOSIS — H353131 Nonexudative age-related macular degeneration, bilateral, early dry stage: Secondary | ICD-10-CM | POA: Diagnosis not present

## 2020-03-03 DIAGNOSIS — Z961 Presence of intraocular lens: Secondary | ICD-10-CM | POA: Diagnosis not present

## 2020-03-17 ENCOUNTER — Telehealth: Payer: Self-pay | Admitting: Neurology

## 2020-03-17 NOTE — Telephone Encounter (Signed)
Patients husband called to ask about new alzheimer's medication he saw on tv. He would like to know if his wife is eligible for it and if not, then why. Please call.

## 2020-03-18 NOTE — Telephone Encounter (Signed)
Telephone call to pt Husband, Advised of note below.

## 2020-03-18 NOTE — Telephone Encounter (Signed)
Pls let them know that Aducanumab has been approved so that it can go into Phase 4 of trials, so it is not available commercially yet. For now patients who get it will be in that phase of the trial through 60 treatment centers (we don't know which centers those are). Reported side effects of the medication include brain bleeding or brain swelling.  We still need more information at this point in time, but so far it looks like it is potentially helpful for earlier stage patients, so [name] may not be a candidate, but we don't have much information yet.

## 2020-03-18 NOTE — Telephone Encounter (Signed)
Pls let them know that Aducanumab has been approved so that it can go into Phase 4 of trials, so it is not available commercially yet. For now patients who get it will be in that phase of the trial through 60 treatment centers (we don't know which centers those are). We still need more information at this point in time, but so far it looks like it is potentially helpful for earlier stage patients, so Mrs. Prestrige may not be a candidate, but we don't have much information yet.

## 2020-04-09 NOTE — Progress Notes (Signed)
NEUROLOGY FOLLOW UP OFFICE NOTE  [name] [id number]  HISTORY OF PRESENT ILLNESS: [name] is an 84 year old right-handed female with GERD, peripheral vascular disease, hyperlipidemia, COPD, hypertension, hypothyroidism, and anxiety who follows up for Alzheimer's dementia. She is accompanied by her husband who supplements history.  UPDATE: She is taking Aricept 10mg  daily and Namenda 10mg  twice daily.  Memoryloss has slightly progressed. She has trouble with some names. She is able to dress, bathe and use the toilet herself. She doesn't keep the house tidy as she used to. She does not cook or drive. She sleeps well at night but also often during the day.She does not get agitated or combative. She is not hallucinating or delusional. She denies depression.For past 3 months, she has had upset stomach.  She feels queasy.  She vomited in her plate at the dinner table.    HISTORY: Her husband began to notice changes in memory around 2012 or 2013.She would have problems with short-term memory.She would forget about phone calls.She would repeat questions. Her husband usually goes to the grocery store.She has no difficulty remembering names or faces.She has no hallucinations or delusions.She sleeps well.She is not depressed.There has been no change in personality or behavior.She is able to perform all her ADLs.She has gotten lost while driving.During the day, she tends to the house but spends time watching TV.She reads the Bible but not everyday.She and her husband are active in the church.They go to choir practice every Wednesday night.They participate in a senior group once a month.Her mother and maternal aunt had dementia.  She has history of nausea and mild anorexia.No vomiting.She had been on both Aricept and rivastigmine at one time and were discontinued with concern that it was causing the nausea.However, the nausea  never resolved.She has been evaluated by GI with no clear etiology.She was subsequently restarted on Aricept and then Namenda.  11/05/13 CT HEAD:no bleed, mass lesions or acute infarcts seen.Mild atrophy and atherosclerotic changes noted.  PAST MEDICAL HISTORY: Past Medical History:  Diagnosis Date  . Anxiety   . COPD (chronic obstructive pulmonary disease) ([place])   . Dementia ([place])   . Diverticulosis of colon (without mention of hemorrhage)   . Gallstones   . GERD (gastroesophageal reflux disease)   . Hiatal hernia   . Hyperlipidemia   . Hypertension   . Hypothyroidism   . Low back pain    OA R radiculopathy  . Lower extremity edema    chronic  . Osteopenia   . Peptic ulcer   . Peripheral vascular disease ([place])   . Personal history of colonic polyps 2001   TUBULAR ADENOMA - [name]  . Pneumonia     MEDICATIONS: Current Outpatient Medications on File Prior to Visit  Medication Sig Dispense Refill  . acetaminophen (TYLENOL) 325 MG tablet Take 650 mg by mouth every 6 (six) hours as needed.    Marland Kitchen aspirin 81 MG EC tablet Take 81 mg by mouth daily.     . Cholecalciferol (VITAMIN D3) 2000 units capsule Take 1 capsule (2,000 Units total) daily by mouth. 100 capsule 3  . Diclofenac Sodium (PENNSAID) 1.5 % SOLN Place 3-5 drops onto the skin 3 (three) times daily as needed. For pain applies on joints    . donepezil (ARICEPT) 10 MG tablet TAKE 1 TABLET AT BEDTIME 90 tablet 3  . furosemide (LASIX) 20 MG tablet TAKE 2 TABLETS DAILY 180 tablet 3  . INCRUSE ELLIPTA 62.5 MCG/INH AEPB USE 1  INHALATION DAILY 90 each 3  . losartan (COZAAR) 50 MG tablet Take 1 tablet (50 mg total) by mouth in the morning and at bedtime. 180 tablet 3  . memantine (NAMENDA) 10 MG tablet TAKE 1 TABLET TWICE A DAY 180 tablet 3  . Multiple Vitamin (MULTIVITAMIN) tablet Take 1 tablet by mouth daily.    Marland Kitchen omeprazole (PRILOSEC) 40 MG capsule TAKE 1 CAPSULE DAILY 90 capsule 3  . ondansetron (ZOFRAN) 4 MG tablet  Take 1 tablet (4 mg total) by mouth every 8 (eight) hours as needed. Take  1 tab every 6 hours as needed for nausea and vomiting. 60 tablet 1  . simvastatin (ZOCOR) 40 MG tablet Take 1 tablet (40 mg total) by mouth daily. 90 tablet 3  . SYNTHROID 50 MCG tablet TAKE 1 TABLET DAILY 90 tablet 3   No current facility-administered medications on file prior to visit.    ALLERGIES: Allergies  Allergen Reactions  . Codeine Sulfate Other (See Comments)    Extreme headaches  . Exelon [Rivastigmine Tartrate]     nausea  . Oxycodone-Acetaminophen Other (See Comments)    Extreme headaches    FAMILY HISTORY: Family History  Problem Relation Age of Onset  . Dementia Mother   . Hypertension Father   . Heart disease Father   . Colon cancer Sister   . Heart disease Sister   . Esophageal cancer Neg Hx   . Rectal cancer Neg Hx   . Stomach cancer Neg Hx     SOCIAL HISTORY: Social History   Socioeconomic History  . Marital status: Married    Spouse name: Not on file  . Number of children: 4  . Years of education: Not on file  . Highest education level: Not on file  Occupational History  . Occupation: retired  Tobacco Use  . Smoking status: Current Every Day Smoker    Packs/day: 1.50    Years: 61.00    Pack years: 91.50    Types: Cigarettes  . Smokeless tobacco: Never Used  . Tobacco comment: Sheet given on smoking  Vaping Use  . Vaping Use: Never used  Substance and Sexual Activity  . Alcohol use: No    Alcohol/week: 0.0 standard drinks  . Drug use: No  . Sexual activity: Never    Partners: Male  Other Topics Concern  . Not on file  Social History Narrative  . Not on file   Social Determinants of Health   Financial Resource Strain:   . Difficulty of Paying Living Expenses:   Food Insecurity:   . Worried About Charity fundraiser in the Last Year:   . Arboriculturist in the Last Year:   Transportation Needs:   . Film/video editor (Medical):   Marland Kitchen Lack of  Transportation (Non-Medical):   Physical Activity:   . Days of Exercise per Week:   . Minutes of Exercise per Session:   Stress:   . Feeling of Stress :   Social Connections:   . Frequency of Communication with Friends and Family:   . Frequency of Social Gatherings with Friends and Family:   . Attends Religious Services:   . Active Member of Clubs or Organizations:   . Attends Archivist Meetings:   Marland Kitchen Marital Status:   Intimate Partner Violence:   . Fear of Current or Ex-Partner:   . Emotionally Abused:   Marland Kitchen Physically Abused:   . Sexually Abused:     PHYSICAL EXAM: Blood pressure  118/60, pulse 86, height 5\' 2"  (1.575 m), weight 130 lb (59 kg), SpO2 98 %. General: No acute distress.  Patient appears well-groomed.   Head:  Normocephalic/atraumatic Eyes:  Fundi examined but not visualized Neck: supple, no paraspinal tenderness, full range of motion Heart:  Regular rate and rhythm Lungs:  Clear to auscultation bilaterally Back: No paraspinal tenderness Neurological Exam:  alert and oriented to person only. Attention span and concentration reduced, recent and remote memory impaired, fund of knowledge impaired.  Speech fluent and not dysarthric, language intact.   MMSE - Mini Mental State Exam 04/13/2020 04/16/2019 04/17/2018 04/16/2018 04/05/2017 03/15/2017 03/15/2016  Not completed: - - (No Data) - Refused - -  Orientation to time 1 1 - 0 - 4 4  Orientation to Place 0 5 - 4 - 3 4  Registration 3 3 - 3 - 3 3  Attention/ Calculation 1 0 - 3 - 5 5  Recall 0 0 - 0 - 0 0  Language- name 2 objects 2 1 - 2 - 2 2  Language- repeat 1 0 - 1 - 1 1  Language- follow 3 step command 3 3 - 3 - 3 3  Language- read & follow direction 1 1 - 1 - 1 1  Write a sentence 1 1 - 1 - 1 1  Copy design 1 1 - 1 - 1 1  Total score 14 16 - 19 - 24 25   CN II-XII intact. Bulk and tone normal, muscle strength 5/5 throughout.  Sensation to light touch  intact.  Deep tendon reflexes 2+ throughout.  Finger to  nose testing intact.  Gait normal, Romberg negative.  IMPRESSION: Alzheimer's dementia, slight progression over past year  PLAN: 1.  Donepezil 10mg  daily and memantine 10mg  twice daily 2.  Follow up with PCP regarding nausea. 3.  Follow up one year  Metta Clines, DO  CC: Lew Dawes, MD

## 2020-04-13 ENCOUNTER — Other Ambulatory Visit: Payer: Self-pay

## 2020-04-13 ENCOUNTER — Ambulatory Visit (INDEPENDENT_AMBULATORY_CARE_PROVIDER_SITE_OTHER): Payer: Medicare Other | Admitting: Neurology

## 2020-04-13 ENCOUNTER — Encounter: Payer: Self-pay | Admitting: Neurology

## 2020-04-13 VITALS — BP 118/60 | HR 86 | Ht 62.0 in | Wt 130.0 lb

## 2020-04-13 DIAGNOSIS — I6523 Occlusion and stenosis of bilateral carotid arteries: Secondary | ICD-10-CM

## 2020-04-13 DIAGNOSIS — G301 Alzheimer's disease with late onset: Secondary | ICD-10-CM | POA: Diagnosis not present

## 2020-04-13 DIAGNOSIS — F0280 Dementia in other diseases classified elsewhere without behavioral disturbance: Secondary | ICD-10-CM | POA: Diagnosis not present

## 2020-04-13 NOTE — Patient Instructions (Signed)
1.  Continue donepezil and memantine 2.  Follow up in one year   Alzheimer Disease Caregiver Guide  Alzheimer disease causes a person to lose the ability to remember things and make decisions. A person who has Alzheimer disease may not be able to take care of himself or herself. He or she may need help with simple tasks. The tips below can help you care for the person. What kind of changes does this condition cause? This condition makes a person:  Forget things.  Feel confused.  Act differently.  Have different moods. These things get worse with time. Tips to help with symptoms  Be calm and patient.  Respond with a simple, short answer.  Avoid correcting the person in a negative way.  Try not to take things personally, even if the person forgets your name.  Do not argue with the person. This may make the person more upset. Tips to lessen frustration  Make appointments and do daily tasks when the person is at his or her best.  Take your time. Simple tasks may take longer. Allow plenty of time to complete tasks.  Limit choices for the person.  Involve the person in what you are doing.  Keep a daily routine.  Avoid new or crowded places, if possible.  Use simple words, short sentences, and a calm voice. Only give one direction at a time.  Buy clothes and shoes that are easy to put on and take off.  Organize medicines in a pillbox for each day of the week.  Keep a calendar in a central location to remind the person of meetings or other activities.  Let people help if they offer. Take a break when needed. Tips to prevent injury  Keep floors clear. Remove rugs, magazine racks, and floor lamps.  Keep hallways well-lit.  Put a handrail and non-slip mat in the bathtub or shower.  Put childproof locks on cabinets that have dangerous items in them. These items include medicine, alcohol, guns, toxic cleaning items, sharp tools, matches, and lighters.  Put locks on  doors where the person cannot see or reach them. This helps the person to not wander out of the house and get lost.  Be prepared for emergencies. Keep a list of emergency phone numbers and addresses close by.  Bracelets may be worn that track location and identify the person as having memory problems. This should be worn at all times for safety. Tips for the future  Discuss financial and legal planning early. People with this disease have trouble managing their money as the disease gets worse. Get help from a professional.  Talk about advance directives, safety, and daily care. Take these steps: ? Create a living will and choose a power of attorney. This is someone who can make decisions for the person with Alzheimer disease when he or she can no longer do so. ? Discuss driving safety and when to stop driving. The person's doctor can help with this. ? If the person lives alone, make sure he or she is safe. Some people need extra help at home. Other people need more care at a nursing home or care center. Where to find support You can find support by joining a support group near you. Some benefits of joining a support group include:  Learning ways to manage stress.  Sharing experiences with others.  Getting emotional comfort and support.  Learning about caregiving as the disease progresses.  Knowing what community resources are available and making use  of them. Where to find more information  Alzheimer's Association: [employer] Contact a doctor if:  The person has a fever.  The person has a sudden behavior change that does not get better with calming strategies.  The person is not able to take care of himself or herself at home.  The person threatens you or anyone else, including himself or herself.  You are no longer able to care for the person. Summary  Alzheimer disease causes a person to forget things and to be confused.  A person who has this condition may not be able  to take care of himself or herself.  Take steps to keep the person from getting hurt. Plan for future care.  You can find support by joining a support group near you. This information is not intended to replace advice given to you by your health care provider. Make sure you discuss any questions you have with your health care provider. Document Revised: 01/14/2019 Document Reviewed: 09/20/2017 Elsevier Patient Education  2020 [employer].

## 2020-05-19 ENCOUNTER — Telehealth: Payer: Self-pay | Admitting: Neurology

## 2020-05-19 NOTE — Telephone Encounter (Signed)
Pls let him know that Alzheimer's does not typically do this, but the Aricept/Donepezil can cause diarrhea. Have him hold Donepezil for a week and see if diarrhea stops. Thanks

## 2020-05-19 NOTE — Telephone Encounter (Signed)
Tried calling pt husband back he is out right now. Will call back later

## 2020-05-19 NOTE — Telephone Encounter (Signed)
Patient's husband called in and wanted to speak with someone about the patient. He wants to find out if the patient's Alzheimer's can mess with the digestive tract and cause diarrhea.

## 2020-05-20 NOTE — Telephone Encounter (Signed)
Advised husband of note below.

## 2020-06-03 ENCOUNTER — Telehealth: Payer: Self-pay | Admitting: Internal Medicine

## 2020-06-03 ENCOUNTER — Encounter: Payer: Self-pay | Admitting: Family Medicine

## 2020-06-03 ENCOUNTER — Other Ambulatory Visit: Payer: Self-pay

## 2020-06-03 ENCOUNTER — Ambulatory Visit (INDEPENDENT_AMBULATORY_CARE_PROVIDER_SITE_OTHER): Payer: Medicare Other | Admitting: Family Medicine

## 2020-06-03 VITALS — BP 100/60 | HR 79 | Temp 97.1°F | Ht 62.0 in | Wt 132.8 lb

## 2020-06-03 DIAGNOSIS — I872 Venous insufficiency (chronic) (peripheral): Secondary | ICD-10-CM | POA: Insufficient documentation

## 2020-06-03 NOTE — Progress Notes (Signed)
Established Patient Office Visit  Subjective:  Patient ID: [name], female    DOB: December 04, 1935  Age: 84 y.o. MRN: [id number]  CC:  Chief Complaint  Patient presents with  . Edema    swelling in left foot and leg x 1 week    HPI [name] presents for evaluation of a 2 to 3-day history of swelling in the left lower leg that is greater than the right.  She is accompanied by her husband who tells me that she has is rather sedentary.  History of swelling on both sides with recent increased swelling on the left.  Swelling does tend to regress overnight.  There is no calf pain, chest pain or shortness of breath.  History of Alzheimer's dementia, COPD and peripheral vascular disease.  Past Medical History:  Diagnosis Date  . Anxiety   . COPD (chronic obstructive pulmonary disease) ([place])   . Dementia ([place])   . Diverticulosis of colon (without mention of hemorrhage)   . Gallstones   . GERD (gastroesophageal reflux disease)   . Hiatal hernia   . Hyperlipidemia   . Hypertension   . Hypothyroidism   . Low back pain    OA R radiculopathy  . Lower extremity edema    chronic  . Osteopenia   . Peptic ulcer   . Peripheral vascular disease ([place])   . Personal history of colonic polyps 2001   TUBULAR ADENOMA - [name]  . Pneumonia     Past Surgical History:  Procedure Laterality Date  . ABDOMINAL ADHESION SURGERY    . ABDOMINAL HYSTERECTOMY    . APPENDECTOMY    . CARPAL TUNNEL RELEASE Bilateral   . CATARACT EXTRACTION W/ INTRAOCULAR LENS  IMPLANT, BILATERAL    . CHOLECYSTECTOMY    . NECK SURGERY     bone and plate   . ROTATOR CUFF REPAIR Bilateral   . TUBAL LIGATION      Family History  Problem Relation Age of Onset  . Dementia Mother   . Hypertension Father   . Heart disease Father   . Colon cancer Sister   . Heart disease Sister   . Esophageal cancer Neg Hx   . Rectal cancer Neg Hx   . Stomach cancer Neg Hx     Social History   Socioeconomic  History  . Marital status: Married    Spouse name: Not on file  . Number of children: 4  . Years of education: Not on file  . Highest education level: Not on file  Occupational History  . Occupation: retired  Tobacco Use  . Smoking status: Current Every Day Smoker    Packs/day: 1.50    Years: 61.00    Pack years: 91.50    Types: Cigarettes  . Smokeless tobacco: Never Used  . Tobacco comment: Sheet given on smoking  Vaping Use  . Vaping Use: Never used  Substance and Sexual Activity  . Alcohol use: No    Alcohol/week: 0.0 standard drinks  . Drug use: No  . Sexual activity: Never    Partners: Male  Other Topics Concern  . Not on file  Social History Narrative  . Not on file   Social Determinants of Health   Financial Resource Strain:   . Difficulty of Paying Living Expenses: Not on file  Food Insecurity:   . Worried About Charity fundraiser in the Last Year: Not on file  . Ran Out of Food in the Last Year: Not  on file  Transportation Needs:   . Lack of Transportation (Medical): Not on file  . Lack of Transportation (Non-Medical): Not on file  Physical Activity:   . Days of Exercise per Week: Not on file  . Minutes of Exercise per Session: Not on file  Stress:   . Feeling of Stress : Not on file  Social Connections:   . Frequency of Communication with Friends and Family: Not on file  . Frequency of Social Gatherings with Friends and Family: Not on file  . Attends Religious Services: Not on file  . Active Member of Clubs or Organizations: Not on file  . Attends Archivist Meetings: Not on file  . Marital Status: Not on file  Intimate Partner Violence:   . Fear of Current or Ex-Partner: Not on file  . Emotionally Abused: Not on file  . Physically Abused: Not on file  . Sexually Abused: Not on file    Outpatient Medications Prior to Visit  Medication Sig Dispense Refill  . acetaminophen (TYLENOL) 325 MG tablet Take 650 mg by mouth every 6 (six) hours  as needed.    Marland Kitchen aspirin 81 MG EC tablet Take 81 mg by mouth daily.     . Cholecalciferol (VITAMIN D3) 2000 units capsule Take 1 capsule (2,000 Units total) daily by mouth. 100 capsule 3  . Diclofenac Sodium (PENNSAID) 1.5 % SOLN Place 3-5 drops onto the skin 3 (three) times daily as needed. For pain applies on joints    . donepezil (ARICEPT) 10 MG tablet TAKE 1 TABLET AT BEDTIME 90 tablet 3  . furosemide (LASIX) 20 MG tablet TAKE 2 TABLETS DAILY 180 tablet 3  . INCRUSE ELLIPTA 62.5 MCG/INH AEPB USE 1 INHALATION DAILY 90 each 3  . losartan (COZAAR) 50 MG tablet Take 1 tablet (50 mg total) by mouth in the morning and at bedtime. 180 tablet 3  . memantine (NAMENDA) 10 MG tablet TAKE 1 TABLET TWICE A DAY 180 tablet 3  . Multiple Vitamin (MULTIVITAMIN) tablet Take 1 tablet by mouth daily.    Marland Kitchen omeprazole (PRILOSEC) 40 MG capsule TAKE 1 CAPSULE DAILY 90 capsule 3  . ondansetron (ZOFRAN) 4 MG tablet Take 1 tablet (4 mg total) by mouth every 8 (eight) hours as needed. Take  1 tab every 6 hours as needed for nausea and vomiting. 60 tablet 1  . simvastatin (ZOCOR) 40 MG tablet Take 1 tablet (40 mg total) by mouth daily. 90 tablet 3  . SYNTHROID 50 MCG tablet TAKE 1 TABLET DAILY 90 tablet 3   No facility-administered medications prior to visit.    Allergies  Allergen Reactions  . Codeine Sulfate Other (See Comments)    Extreme headaches  . Exelon [Rivastigmine Tartrate]     nausea  . Oxycodone-Acetaminophen Other (See Comments)    Extreme headaches    ROS Review of Systems  Constitutional: Negative.   Respiratory: Negative for chest tightness, shortness of breath and wheezing.   Cardiovascular: Negative for chest pain and palpitations.  Musculoskeletal: Negative for joint swelling.  Psychiatric/Behavioral: Negative.       Objective:    Physical Exam Constitutional:      General: She is not in acute distress.    Appearance: Normal appearance. She is normal weight. She is not  ill-appearing or toxic-appearing.  HENT:     Right Ear: External ear normal.     Left Ear: External ear normal.  Eyes:     General: No scleral icterus.  Right eye: No discharge.        Left eye: No discharge.     Conjunctiva/sclera: Conjunctivae normal.  Cardiovascular:     Rate and Rhythm: Normal rate and regular rhythm.     Pulses:          Dorsalis pedis pulses are 2+ on the left side.       Posterior tibial pulses are 1+ on the left side.  Pulmonary:     Effort: Pulmonary effort is normal.  Musculoskeletal:     Right lower leg: No edema.     Left lower leg: No edema.       Legs:  Neurological:     Mental Status: She is alert.  Psychiatric:        Mood and Affect: Mood normal.        Behavior: Behavior normal.     BP 100/60   Pulse 79   Temp (!) 97.1 F (36.2 C) (Tympanic)   Ht 5\' 2"  (1.575 m)   Wt 132 lb 12.8 oz (60.2 kg)   SpO2 99%   BMI 24.29 kg/m  Wt Readings from Last 3 Encounters:  06/03/20 132 lb 12.8 oz (60.2 kg)  04/13/20 130 lb (59 kg)  01/29/20 136 lb (61.7 kg)     Health Maintenance Due  Topic Date Due  . INFLUENZA VACCINE  05/09/2020    There are no preventive care reminders to display for this patient.  Lab Results  Component Value Date   TSH 1.50 01/07/2019   Lab Results  Component Value Date   WBC 7.6 05/22/2018   HGB 15.2 (H) 05/22/2018   HCT 45.1 05/22/2018   MCV 87.2 05/22/2018   PLT 145.0 (L) 05/22/2018   Lab Results  Component Value Date   NA 140 10/09/2019   K 3.7 10/09/2019   CO2 33 (H) 10/09/2019   GLUCOSE 183 (H) 10/09/2019   BUN 16 10/09/2019   CREATININE 1.03 10/09/2019   BILITOT 0.7 01/03/2017   ALKPHOS 82 01/03/2017   AST 16 01/03/2017   ALT 12 01/03/2017   PROT 6.6 01/03/2017   ALBUMIN 4.1 01/03/2017   CALCIUM 9.6 10/09/2019   GFR 51.32 (L) 10/09/2019   Lab Results  Component Value Date   CHOL 155 11/15/2017   Lab Results  Component Value Date   HDL 40.30 11/15/2017   Lab Results  Component  Value Date   LDLCALC 70 12/16/2013   Lab Results  Component Value Date   TRIG 206.0 (H) 11/15/2017   Lab Results  Component Value Date   CHOLHDL 4 11/15/2017   Lab Results  Component Value Date   HGBA1C 5.8 10/12/2011      Assessment & Plan:   Problem List Items Addressed This Visit      Cardiovascular and Mediastinum   Venous incompetence - Primary      No orders of the defined types were placed in this encounter.   Follow-up: No follow-ups on file.  Recommend elevation and gentle compression.  Increased physical activity with walking would be helpful.  Information was given on venous incompetence.  Discussed symptoms of DVT and PE.  Libby Maw, MD

## 2020-06-03 NOTE — Telephone Encounter (Signed)
Swelling in left leg and foot for the last week, husband concerned it may be a clot  Transferred the call to team health

## 2020-06-03 NOTE — Telephone Encounter (Signed)
Team Health Report/Call: ---Caller states his wife's left foot and leg have been swollen for 1 Week.  Advised see PCP in 4 hours, Appointment made at [facility].

## 2020-06-03 NOTE — Patient Instructions (Signed)

## 2020-06-04 ENCOUNTER — Other Ambulatory Visit: Payer: Self-pay | Admitting: Internal Medicine

## 2020-06-04 NOTE — Telephone Encounter (Signed)
Agree with an appointment.  Thanks

## 2020-06-17 DIAGNOSIS — H47329 Drusen of optic disc, unspecified eye: Secondary | ICD-10-CM | POA: Diagnosis not present

## 2020-06-17 DIAGNOSIS — Z961 Presence of intraocular lens: Secondary | ICD-10-CM | POA: Diagnosis not present

## 2020-07-06 ENCOUNTER — Ambulatory Visit (INDEPENDENT_AMBULATORY_CARE_PROVIDER_SITE_OTHER): Payer: Medicare Other

## 2020-07-06 ENCOUNTER — Other Ambulatory Visit: Payer: Self-pay

## 2020-07-06 ENCOUNTER — Ambulatory Visit (INDEPENDENT_AMBULATORY_CARE_PROVIDER_SITE_OTHER): Payer: Medicare Other | Admitting: Family

## 2020-07-06 VITALS — BP 104/62 | HR 81 | Temp 98.2°F | Ht 62.0 in | Wt 132.0 lb

## 2020-07-06 DIAGNOSIS — R600 Localized edema: Secondary | ICD-10-CM

## 2020-07-06 DIAGNOSIS — I700 Atherosclerosis of aorta: Secondary | ICD-10-CM | POA: Diagnosis not present

## 2020-07-06 NOTE — Progress Notes (Signed)
[name] is a 84 y.o. female with the following history as recorded in EpicCare:  Patient Active Problem List   Diagnosis Date Noted  . Venous incompetence 06/03/2020  . Lightheadedness 10/09/2019  . History of colonic polyps 08/10/2015  . Lumbago 06/22/2015  . Tobacco dependence 06/22/2015  . Alzheimer's dementia ([place]) 06/23/2014  . Enteritis 02/21/2014  . Gastroenteritis 02/21/2014  . Hypokalemia 09/24/2013  . Actinic keratoses 11/25/2012  . COPD (chronic obstructive pulmonary disease) ([place])   . Hypertension   . Loss of weight 08/22/2011  . Fatigue 08/22/2011  . Benign neoplasm of colon 08/09/2011  . Diverticulosis of colon (without mention of hemorrhage) 08/09/2011  . Family history of malignant neoplasm of gastrointestinal tract 08/08/2011  . Nausea 08/08/2011  . HAND PAIN 05/30/2010  . SMOKER 08/03/2009  . NEOPLASM, SKIN, UNCERTAIN BEHAVIOR 03/54/6568  . Memory loss 01/27/2009  . Edema 06/08/2008  . HIP PAIN, RIGHT 06/04/2008  . BACK PAIN 06/04/2008  . OTHER MALAISE AND FATIGUE 11/18/2007  . Prediabetes 11/18/2007  . CHEST PAIN, UNSPECIFIED 10/22/2007  . Acute bronchitis 10/07/2007  . Dyslipidemia 07/24/2007  . Osteoporosis 07/24/2007  . Hypothyroidism 05/08/2007  . Carotid artery stenosis 05/08/2007  . Peripheral vascular disease ([place]) 05/08/2007  . GERD 05/08/2007  . MIGRAINES, HX OF 05/08/2007    Current Outpatient Medications  Medication Sig Dispense Refill  . acetaminophen (TYLENOL) 325 MG tablet Take 650 mg by mouth every 6 (six) hours as needed.    Marland Kitchen aspirin 81 MG EC tablet Take 81 mg by mouth daily.     . Cholecalciferol (VITAMIN D3) 2000 units capsule Take 1 capsule (2,000 Units total) daily by mouth. 100 capsule 3  . Diclofenac Sodium (PENNSAID) 1.5 % SOLN Place 3-5 drops onto the skin 3 (three) times daily as needed. For pain applies on joints    . donepezil (ARICEPT) 10 MG tablet TAKE 1 TABLET AT BEDTIME 90 tablet 3  . furosemide (LASIX)  20 MG tablet TAKE 2 TABLETS DAILY 180 tablet 3  . INCRUSE ELLIPTA 62.5 MCG/INH AEPB USE 1 INHALATION DAILY 90 each 3  . losartan (COZAAR) 50 MG tablet Take 1 tablet (50 mg total) by mouth in the morning and at bedtime. 180 tablet 3  . memantine (NAMENDA) 10 MG tablet TAKE 1 TABLET TWICE A DAY 180 tablet 3  . Multiple Vitamin (MULTIVITAMIN) tablet Take 1 tablet by mouth daily.    Marland Kitchen omeprazole (PRILOSEC) 40 MG capsule TAKE 1 CAPSULE DAILY 90 capsule 3  . ondansetron (ZOFRAN) 4 MG tablet Take 1 tablet (4 mg total) by mouth every 8 (eight) hours as needed. Take  1 tab every 6 hours as needed for nausea and vomiting. 60 tablet 1  . simvastatin (ZOCOR) 40 MG tablet Take 1 tablet (40 mg total) by mouth daily. 90 tablet 3  . SYNTHROID 50 MCG tablet TAKE 1 TABLET DAILY 90 tablet 3   No current facility-administered medications for this visit.    Allergies: Codeine sulfate, Exelon [rivastigmine tartrate], and Oxycodone-acetaminophen  Past Medical History:  Diagnosis Date  . Anxiety   . COPD (chronic obstructive pulmonary disease) ([place])   . Dementia ([place])   . Diverticulosis of colon (without mention of hemorrhage)   . Gallstones   . GERD (gastroesophageal reflux disease)   . Hiatal hernia   . Hyperlipidemia   . Hypertension   . Hypothyroidism   . Low back pain    OA R radiculopathy  . Lower extremity edema    chronic  .  Osteopenia   . Peptic ulcer   . Peripheral vascular disease ([place])   . Personal history of colonic polyps 2001   TUBULAR ADENOMA - [name]  . Pneumonia     Past Surgical History:  Procedure Laterality Date  . ABDOMINAL ADHESION SURGERY    . ABDOMINAL HYSTERECTOMY    . APPENDECTOMY    . CARPAL TUNNEL RELEASE Bilateral   . CATARACT EXTRACTION W/ INTRAOCULAR LENS  IMPLANT, BILATERAL    . CHOLECYSTECTOMY    . NECK SURGERY     bone and plate   . ROTATOR CUFF REPAIR Bilateral   . TUBAL LIGATION      Family History  Problem Relation Age of Onset  . Dementia Mother   .  Hypertension Father   . Heart disease Father   . Colon cancer Sister   . Heart disease Sister   . Esophageal cancer Neg Hx   . Rectal cancer Neg Hx   . Stomach cancer Neg Hx     Social History   Tobacco Use  . Smoking status: Current Every Day Smoker    Packs/day: 1.50    Years: 61.00    Pack years: 91.50    Types: Cigarettes  . Smokeless tobacco: Never Used  . Tobacco comment: Sheet given on smoking  Substance Use Topics  . Alcohol use: No    Alcohol/week: 0.0 standard drinks    Subjective:  Concerned with persisting swelling in her lower extremities; accompanied by husband; saw a provider with similar concerns last month and was encouraged to try and elevate feet as she is sedentary majority of day; readily admits she is not doing this; husband notes swelling has been present for "months" but feels that left left is worsening; of note, she takes her Lasix 20 mg bid as opposed to 2 po qd as written;   Objective:  Vitals:   07/06/20 1224  BP: 104/62  Pulse: 81  Temp: 98.2 F (36.8 C)  TempSrc: Oral  SpO2: 95%  Weight: 132 lb (59.9 kg)  Height: '5\' 2"'  (1.575 m)    General: Well developed, well nourished, in no acute distress  Skin : Warm and dry.  Head: Normocephalic and atraumatic  Eyes: Sclera and conjunctiva clear; pupils round and reactive to light; extraocular movements intact  Ears: External normal; canals clear; tympanic membranes normal  Oropharynx: Pink, supple. No suspicious lesions  Neck: Supple without thyromegaly, adenopathy  Lungs: Respirations unlabored; clear to auscultation bilaterally without wheeze, rales, rhonchi  CVS exam: normal rate and regular rhythm.  Extremities: bilateral edema, no cyanosis, no clubbing  Vessels: Symmetric bilaterally  Neurologic: Alert and oriented; speech intact; face symmetrical; moves all extremities well; CNII-XII intact without focal deficit   Assessment:  1. Pedal edema     Plan:  Suspect venous insufficiency due  to sedentary lifestyle; will update labs and CXR and venous doppler however; encouraged to try and move some during the day and elevate legs as much as possible when sitting; recommended to consider  Lasix to 40 mg in am and 20 in the afternoon as needed; follow-up to be determined;  This visit occurred during the SARS-CoV-2 public health emergency.  Safety protocols were in place, including screening questions prior to the visit, additional usage of staff PPE, and extensive cleaning of exam room while observing appropriate contact time as indicated for disinfecting solutions.     No follow-ups on file.  Orders Placed This Encounter  Procedures  . DG Chest 2 View  Standing Status:   Future    Number of Occurrences:   1    Standing Expiration Date:   07/06/2021    Order Specific Question:   Reason for Exam (SYMPTOM  OR DIAGNOSIS REQUIRED)    Answer:   pedal edema    Order Specific Question:   Preferred imaging location?    Answer:   [facility]    Order Specific Question:   Radiology Contrast Protocol - do NOT remove file path    Answer:   [email or web address]  . CBC with Differential/Platelet    Standing Status:   Future    Number of Occurrences:   1    Standing Expiration Date:   07/06/2021  . Comp Met (CMET)    Standing Status:   Future    Number of Occurrences:   1    Standing Expiration Date:   07/06/2021  . B Nat Peptide    Standing Status:   Future    Number of Occurrences:   1    Standing Expiration Date:   07/06/2021    Requested Prescriptions    No prescriptions requested or ordered in this encounter

## 2020-07-07 LAB — COMPREHENSIVE METABOLIC PANEL
AG Ratio: 2 (calc) (ref 1.0–2.5)
ALT: 17 U/L (ref 6–29)
AST: 19 U/L (ref 10–35)
Albumin: 4.1 g/dL (ref 3.6–5.1)
Alkaline phosphatase (APISO): 59 U/L (ref 37–153)
BUN/Creatinine Ratio: 21 (calc) (ref 6–22)
BUN: 19 mg/dL (ref 7–25)
CO2: 31 mmol/L (ref 20–32)
Calcium: 9.7 mg/dL (ref 8.6–10.4)
Chloride: 102 mmol/L (ref 98–110)
Creat: 0.9 mg/dL — ABNORMAL HIGH (ref 0.60–0.88)
Globulin: 2.1 g/dL (calc) (ref 1.9–3.7)
Glucose, Bld: 111 mg/dL — ABNORMAL HIGH (ref 65–99)
Potassium: 3.9 mmol/L (ref 3.5–5.3)
Sodium: 143 mmol/L (ref 135–146)
Total Bilirubin: 0.6 mg/dL (ref 0.2–1.2)
Total Protein: 6.2 g/dL (ref 6.1–8.1)

## 2020-07-07 LAB — CBC WITH DIFFERENTIAL/PLATELET
Absolute Monocytes: 595 cells/uL (ref 200–950)
Basophils Absolute: 58 cells/uL (ref 0–200)
Basophils Relative: 0.6 %
Eosinophils Absolute: 115 cells/uL (ref 15–500)
Eosinophils Relative: 1.2 %
HCT: 47.4 % — ABNORMAL HIGH (ref 35.0–45.0)
Hemoglobin: 15.9 g/dL — ABNORMAL HIGH (ref 11.7–15.5)
Lymphs Abs: 3696 cells/uL (ref 850–3900)
MCH: 30.6 pg (ref 27.0–33.0)
MCHC: 33.5 g/dL (ref 32.0–36.0)
MCV: 91.2 fL (ref 80.0–100.0)
MPV: 11.9 fL (ref 7.5–12.5)
Monocytes Relative: 6.2 %
Neutro Abs: 5136 cells/uL (ref 1500–7800)
Neutrophils Relative %: 53.5 %
Platelets: 157 10*3/uL (ref 140–400)
RBC: 5.2 10*6/uL — ABNORMAL HIGH (ref 3.80–5.10)
RDW: 11.8 % (ref 11.0–15.0)
Total Lymphocyte: 38.5 %
WBC: 9.6 10*3/uL (ref 3.8–10.8)

## 2020-07-07 LAB — BRAIN NATRIURETIC PEPTIDE: Brain Natriuretic Peptide: 26 pg/mL (ref <100)

## 2020-07-09 ENCOUNTER — Ambulatory Visit (HOSPITAL_COMMUNITY)
Admission: RE | Admit: 2020-07-09 | Discharge: 2020-07-09 | Disposition: A | Discharge status: Home / Self Care | Payer: Medicare Other | Source: Ambulatory Visit | Attending: Cardiovascular Disease | Admitting: Cardiovascular Disease

## 2020-07-09 ENCOUNTER — Other Ambulatory Visit: Payer: Self-pay

## 2020-07-09 DIAGNOSIS — R600 Localized edema: Secondary | ICD-10-CM | POA: Insufficient documentation

## 2020-07-13 ENCOUNTER — Ambulatory Visit: Payer: Medicare Other

## 2020-07-15 ENCOUNTER — Ambulatory Visit: Payer: Medicare Other | Attending: Internal Medicine

## 2020-07-15 DIAGNOSIS — Z23 Encounter for immunization: Secondary | ICD-10-CM

## 2020-07-15 NOTE — Progress Notes (Signed)
   Covid-19 Vaccination Clinic  Name:  [name]    MRN: [id number] DOB: Nov 26, 1935  07/15/2020  [name] was observed post Covid-19 immunization for 30 minutes based on pre-vaccination screening without incident. She was provided with Vaccine Information Sheet and instruction to access the V-Safe system.   [name] was instructed to call [phone] with any severe reactions post vaccine: Marland Kitchen Difficulty breathing  . Swelling of face and throat  . A fast heartbeat  . A bad rash all over body  . Dizziness and weakness

## 2020-08-02 ENCOUNTER — Ambulatory Visit (INDEPENDENT_AMBULATORY_CARE_PROVIDER_SITE_OTHER): Payer: Medicare Other | Admitting: Internal Medicine

## 2020-08-02 ENCOUNTER — Encounter: Payer: Self-pay | Admitting: Internal Medicine

## 2020-08-02 ENCOUNTER — Other Ambulatory Visit: Payer: Self-pay

## 2020-08-02 VITALS — BP 124/72 | HR 91 | Temp 97.8°F | Ht 62.0 in | Wt 134.0 lb

## 2020-08-02 DIAGNOSIS — F0280 Dementia in other diseases classified elsewhere without behavioral disturbance: Secondary | ICD-10-CM | POA: Diagnosis not present

## 2020-08-02 DIAGNOSIS — Z23 Encounter for immunization: Secondary | ICD-10-CM

## 2020-08-02 DIAGNOSIS — G301 Alzheimer's disease with late onset: Secondary | ICD-10-CM | POA: Diagnosis not present

## 2020-08-02 DIAGNOSIS — R42 Dizziness and giddiness: Secondary | ICD-10-CM | POA: Diagnosis not present

## 2020-08-02 DIAGNOSIS — J209 Acute bronchitis, unspecified: Secondary | ICD-10-CM | POA: Diagnosis not present

## 2020-08-02 DIAGNOSIS — J449 Chronic obstructive pulmonary disease, unspecified: Secondary | ICD-10-CM

## 2020-08-02 DIAGNOSIS — I6523 Occlusion and stenosis of bilateral carotid arteries: Secondary | ICD-10-CM | POA: Diagnosis not present

## 2020-08-02 DIAGNOSIS — R110 Nausea: Secondary | ICD-10-CM

## 2020-08-02 MED ORDER — CEFUROXIME AXETIL 250 MG PO TABS: 250.0000 mg | ORAL_TABLET | Freq: Two times a day (BID) | ORAL | 0 refills | Status: DC

## 2020-08-02 MED ORDER — METHYLPREDNISOLONE 4 MG PO TBPK: ORAL_TABLET | ORAL | 0 refills | Status: DC

## 2020-08-02 MED ORDER — LOSARTAN POTASSIUM 50 MG PO TABS
50.0000 mg | ORAL_TABLET | Freq: Every day | ORAL | 3 refills | Status: DC
Start: 2020-08-02 — End: 2021-03-23

## 2020-08-02 NOTE — Assessment & Plan Note (Signed)
Hold Aricept, Exelon for a day  if nauseated

## 2020-08-02 NOTE — Patient Instructions (Addendum)
Hold Aricept, Exelon for a day  if nauseated   You can try Lion's Mane Mushroom extract or capsules for memory

## 2020-08-02 NOTE — Assessment & Plan Note (Signed)
Ceftin Medrol pack

## 2020-08-02 NOTE — Assessment & Plan Note (Signed)
Hold Aricept, Exelon for a day  if nauseated  Try Lion's mane

## 2020-08-02 NOTE — Assessment & Plan Note (Signed)
Reduce Losartan 1 po qhs

## 2020-08-02 NOTE — Assessment & Plan Note (Signed)
Worse Ceftin Medrol pack

## 2020-08-02 NOTE — Progress Notes (Signed)
Subjective:  Patient ID: [name], female    DOB: 05/08/1936  Age: 84 y.o. MRN: [id number]  CC: Follow-up   HPI [name] presents for upset stomach F/u memory loss C/o COPD - worse, colored mucus  Outpatient Medications Prior to Visit  Medication Sig Dispense Refill  . acetaminophen (TYLENOL) 325 MG tablet Take 650 mg by mouth every 6 (six) hours as needed.    Marland Kitchen aspirin 81 MG EC tablet Take 81 mg by mouth daily.     . Cholecalciferol (VITAMIN D3) 2000 units capsule Take 1 capsule (2,000 Units total) daily by mouth. 100 capsule 3  . Diclofenac Sodium (PENNSAID) 1.5 % SOLN Place 3-5 drops onto the skin 3 (three) times daily as needed. For pain applies on joints    . donepezil (ARICEPT) 10 MG tablet TAKE 1 TABLET AT BEDTIME 90 tablet 3  . furosemide (LASIX) 20 MG tablet TAKE 2 TABLETS DAILY 180 tablet 3  . INCRUSE ELLIPTA 62.5 MCG/INH AEPB USE 1 INHALATION DAILY 90 each 3  . losartan (COZAAR) 50 MG tablet Take 1 tablet (50 mg total) by mouth in the morning and at bedtime. 180 tablet 3  . memantine (NAMENDA) 10 MG tablet TAKE 1 TABLET TWICE A DAY 180 tablet 3  . Multiple Vitamin (MULTIVITAMIN) tablet Take 1 tablet by mouth daily.    Marland Kitchen omeprazole (PRILOSEC) 40 MG capsule TAKE 1 CAPSULE DAILY 90 capsule 3  . ondansetron (ZOFRAN) 4 MG tablet Take 1 tablet (4 mg total) by mouth every 8 (eight) hours as needed. Take  1 tab every 6 hours as needed for nausea and vomiting. 60 tablet 1  . simvastatin (ZOCOR) 40 MG tablet Take 1 tablet (40 mg total) by mouth daily. 90 tablet 3  . SYNTHROID 50 MCG tablet TAKE 1 TABLET DAILY 90 tablet 3   No facility-administered medications prior to visit.    ROS: Review of Systems  Constitutional: Positive for fatigue. Negative for activity change, appetite change, chills and unexpected weight change.  HENT: Negative for congestion, mouth sores and sinus pressure.   Eyes: Negative for visual disturbance.  Respiratory: Positive for  cough and wheezing. Negative for chest tightness and shortness of breath.   Gastrointestinal: Negative for abdominal pain and nausea.  Genitourinary: Negative for difficulty urinating, frequency and vaginal pain.  Musculoskeletal: Negative for back pain and gait problem.  Skin: Negative for pallor and rash.  Neurological: Negative for dizziness, tremors, weakness, numbness and headaches.  Psychiatric/Behavioral: Positive for confusion and decreased concentration. Negative for sleep disturbance and suicidal ideas. The patient is nervous/anxious.     Objective:  BP 124/72 (BP Location: Left Arm, Patient Position: Sitting, Cuff Size: Large)   Pulse 91   Temp 97.8 F (36.6 C) (Oral)   Ht 5\' 2"  (1.575 m)   Wt 134 lb (60.8 kg)   SpO2 97%   BMI 24.51 kg/m   BP Readings from Last 3 Encounters:  08/02/20 124/72  07/06/20 104/62  06/03/20 100/60    Wt Readings from Last 3 Encounters:  08/02/20 134 lb (60.8 kg)  07/06/20 132 lb (59.9 kg)  06/03/20 132 lb 12.8 oz (60.2 kg)    Physical Exam Constitutional:      General: She is not in acute distress.    Appearance: She is well-developed.  HENT:     Head: Normocephalic.     Right Ear: External ear normal.     Left Ear: External ear normal.     Nose: Nose  normal.  Eyes:     General:        Right eye: No discharge.        Left eye: No discharge.     Conjunctiva/sclera: Conjunctivae normal.     Pupils: Pupils are equal, round, and reactive to light.  Neck:     Thyroid: No thyromegaly.     Vascular: No JVD.     Trachea: No tracheal deviation.  Cardiovascular:     Rate and Rhythm: Normal rate and regular rhythm.     Heart sounds: Normal heart sounds.  Pulmonary:     Effort: No respiratory distress.     Breath sounds: No stridor. Wheezing present.  Abdominal:     General: Bowel sounds are normal. There is no distension.     Palpations: Abdomen is soft. There is no mass.     Tenderness: There is no abdominal tenderness. There is  no guarding or rebound.  Musculoskeletal:        General: No tenderness.     Cervical back: Normal range of motion and neck supple.  Lymphadenopathy:     Cervical: No cervical adenopathy.  Skin:    Findings: No erythema or rash.  Neurological:     Mental Status: She is disoriented.     Cranial Nerves: No cranial nerve deficit.     Motor: Weakness present. No abnormal muscle tone.     Coordination: Coordination normal.     Gait: Gait abnormal.     Deep Tendon Reflexes: Reflexes normal.  Psychiatric:        Behavior: Behavior normal.        Thought Content: Thought content normal.        Judgment: Judgment normal.   trace edema L ankle  Lab Results  Component Value Date   WBC 9.6 07/06/2020   HGB 15.9 (H) 07/06/2020   HCT 47.4 (H) 07/06/2020   PLT 157 07/06/2020   GLUCOSE 111 (H) 07/06/2020   CHOL 155 11/15/2017   TRIG 206.0 (H) 11/15/2017   HDL 40.30 11/15/2017   LDLDIRECT 90.0 11/15/2017   LDLCALC 70 12/16/2013   ALT 17 07/06/2020   AST 19 07/06/2020   NA 143 07/06/2020   K 3.9 07/06/2020   CL 102 07/06/2020   CREATININE 0.90 (H) 07/06/2020   BUN 19 07/06/2020   CO2 31 07/06/2020   TSH 1.50 01/07/2019   INR 1.15 08/10/2011   HGBA1C 5.8 10/12/2011    VAS [place] LOWER EXTREMITY VENOUS (DVT)  Result Date: 07/09/2020  Lower Venous DVTStudy Indications: Bilateral feet edema left > right. Patient denies SOB or chest pains.  Comparison Study: None Performing Technologist: Alecia Mackin RVT, RDCS (AE), RDMS  Examination Guidelines: A complete evaluation includes B-mode imaging, spectral Doppler, color Doppler, and power Doppler as needed of all accessible portions of each vessel. Bilateral testing is considered an integral part of a complete examination. Limited examinations for reoccurring indications may be performed as noted. The reflux portion of the exam is performed with the patient in reverse Trendelenburg.   +---------+---------------+---------+-----------+----------+--------------+ RIGHT    CompressibilityPhasicitySpontaneityPropertiesThrombus Aging +---------+---------------+---------+-----------+----------+--------------+ CFV      Full           Yes      Yes                                 +---------+---------------+---------+-----------+----------+--------------+ SFJ      Full  Yes      Yes                                 +---------+---------------+---------+-----------+----------+--------------+ FV Prox  Full           Yes      Yes                                 +---------+---------------+---------+-----------+----------+--------------+ FV Mid   Full           Yes      Yes                                 +---------+---------------+---------+-----------+----------+--------------+ FV DistalFull           Yes      Yes                                 +---------+---------------+---------+-----------+----------+--------------+ PFV      Full                                                        +---------+---------------+---------+-----------+----------+--------------+ POP      Full           Yes      Yes                                 +---------+---------------+---------+-----------+----------+--------------+ PTV      Full           Yes      Yes                                 +---------+---------------+---------+-----------+----------+--------------+ PERO     Full           Yes      Yes                                 +---------+---------------+---------+-----------+----------+--------------+ Gastroc  Full                                                        +---------+---------------+---------+-----------+----------+--------------+ GSV      Full           Yes      Yes                                 +---------+---------------+---------+-----------+----------+--------------+    +---------+---------------+---------+-----------+----------+--------------+ LEFT     CompressibilityPhasicitySpontaneityPropertiesThrombus Aging +---------+---------------+---------+-----------+----------+--------------+ CFV      Full           Yes      Yes                                 +---------+---------------+---------+-----------+----------+--------------+  SFJ      Full           Yes      Yes                                 +---------+---------------+---------+-----------+----------+--------------+ FV Prox  Full           Yes      Yes                                 +---------+---------------+---------+-----------+----------+--------------+ FV Mid   Full           Yes      Yes                                 +---------+---------------+---------+-----------+----------+--------------+ FV DistalFull           Yes      Yes                                 +---------+---------------+---------+-----------+----------+--------------+ PFV      Full                                                        +---------+---------------+---------+-----------+----------+--------------+ POP      Full           Yes      Yes                                 +---------+---------------+---------+-----------+----------+--------------+ PTV      Full           Yes      Yes                                 +---------+---------------+---------+-----------+----------+--------------+ PERO     Full           Yes      Yes                                 +---------+---------------+---------+-----------+----------+--------------+ Gastroc  Full                                                        +---------+---------------+---------+-----------+----------+--------------+ GSV      Full           Yes      Yes                                 +---------+---------------+---------+-----------+----------+--------------+    Findings reported to Marvis Repress Murray's  email though [employer] at 3:15 pm.  Summary: BILATERAL: - No evidence of deep vein thrombosis seen in the lower extremities, bilaterally. -  RIGHT: - No cystic structure found in the popliteal fossa.  LEFT: - No cystic structure found in the popliteal fossa.  *See table(s) above for measurements and observations. Electronically signed by Larae Grooms MD on 07/09/2020 at 6:10:48 PM.    Final     Assessment & Plan:      [name], MD

## 2020-08-10 ENCOUNTER — Other Ambulatory Visit: Payer: Self-pay | Admitting: Internal Medicine

## 2020-11-02 ENCOUNTER — Telehealth: Payer: Self-pay | Admitting: Internal Medicine

## 2020-11-02 ENCOUNTER — Ambulatory Visit (INDEPENDENT_AMBULATORY_CARE_PROVIDER_SITE_OTHER): Payer: Medicare Other | Admitting: Internal Medicine

## 2020-11-02 ENCOUNTER — Other Ambulatory Visit: Payer: Self-pay

## 2020-11-02 ENCOUNTER — Encounter: Payer: Self-pay | Admitting: Internal Medicine

## 2020-11-02 VITALS — BP 128/70 | HR 93 | Temp 98.3°F | Wt 135.2 lb

## 2020-11-02 DIAGNOSIS — R197 Diarrhea, unspecified: Secondary | ICD-10-CM | POA: Insufficient documentation

## 2020-11-02 DIAGNOSIS — G301 Alzheimer's disease with late onset: Secondary | ICD-10-CM | POA: Diagnosis not present

## 2020-11-02 DIAGNOSIS — F0280 Dementia in other diseases classified elsewhere without behavioral disturbance: Secondary | ICD-10-CM

## 2020-11-02 DIAGNOSIS — M810 Age-related osteoporosis without current pathological fracture: Secondary | ICD-10-CM | POA: Diagnosis not present

## 2020-11-02 DIAGNOSIS — K5792 Diverticulitis of intestine, part unspecified, without perforation or abscess without bleeding: Secondary | ICD-10-CM | POA: Diagnosis not present

## 2020-11-02 DIAGNOSIS — R112 Nausea with vomiting, unspecified: Secondary | ICD-10-CM

## 2020-11-02 MED ORDER — DENOSUMAB 60 MG/ML ~~LOC~~ SOSY
60.0000 mg | PREFILLED_SYRINGE | Freq: Once | SUBCUTANEOUS | Status: AC
  Administered 2020-11-02: 60 mg via SUBCUTANEOUS

## 2020-11-02 MED ORDER — CIPROFLOXACIN HCL 500 MG PO TABS: 500.0000 mg | ORAL_TABLET | Freq: Two times a day (BID) | ORAL | 0 refills | Status: DC

## 2020-11-02 MED ORDER — ONDANSETRON HCL 4 MG PO TABS
4.0000 mg | ORAL_TABLET | Freq: Three times a day (TID) | ORAL | 1 refills | Status: DC | PRN
Start: 2020-11-02 — End: 2020-11-03

## 2020-11-02 MED ORDER — METRONIDAZOLE 500 MG PO TABS: 500.0000 mg | ORAL_TABLET | Freq: Three times a day (TID) | ORAL | 0 refills | Status: DC

## 2020-11-02 NOTE — Assessment & Plan Note (Addendum)
Start Cipro, Flagyl po Diet discussed Treat nausea, diarrhea Go to ER if worse

## 2020-11-02 NOTE — Assessment & Plan Note (Signed)
Cont w/Aricept and Namenda po [place]'s mane

## 2020-11-02 NOTE — Assessment & Plan Note (Signed)
Zofran prn

## 2020-11-02 NOTE — Telephone Encounter (Signed)
Start Cipro today.  Start Flagyl tomorrow if tolerated.  I called.  [name] knows.

## 2020-11-02 NOTE — Progress Notes (Signed)
Subjective:  Patient ID: [name], female    DOB: 11/23/1935  Age: 85 y.o. MRN: [id number]  CC: Follow-up (3 month f/u- Prolia shot)   HPI [name] presents for Alzheimer's C/o diarrhea x 4 d   Outpatient Medications Prior to Visit  Medication Sig Dispense Refill  . acetaminophen (TYLENOL) 325 MG tablet Take 650 mg by mouth every 6 (six) hours as needed.    Marland Kitchen aspirin 81 MG EC tablet Take 81 mg by mouth daily.    . Cholecalciferol (VITAMIN D3) 2000 units capsule Take 1 capsule (2,000 Units total) daily by mouth. 100 capsule 3  . Diclofenac Sodium 1.5 % SOLN Place 3-5 drops onto the skin 3 (three) times daily as needed. For pain applies on joints    . donepezil (ARICEPT) 10 MG tablet TAKE 1 TABLET AT BEDTIME 90 tablet 3  . furosemide (LASIX) 20 MG tablet TAKE 2 TABLETS DAILY 180 tablet 3  . INCRUSE ELLIPTA 62.5 MCG/INH AEPB USE 1 INHALATION DAILY 90 each 3  . losartan (COZAAR) 50 MG tablet Take 1 tablet (50 mg total) by mouth at bedtime. 90 tablet 3  . memantine (NAMENDA) 10 MG tablet TAKE 1 TABLET TWICE A DAY 180 tablet 3  . methylPREDNISolone (MEDROL DOSEPAK) 4 MG TBPK tablet As directed 21 tablet 0  . Multiple Vitamin (MULTIVITAMIN) tablet Take 1 tablet by mouth daily.    Marland Kitchen omeprazole (PRILOSEC) 40 MG capsule TAKE 1 CAPSULE DAILY 90 capsule 3  . ondansetron (ZOFRAN) 4 MG tablet Take 1 tablet (4 mg total) by mouth every 8 (eight) hours as needed. Take  1 tab every 6 hours as needed for nausea and vomiting. 60 tablet 1  . simvastatin (ZOCOR) 40 MG tablet Take 1 tablet (40 mg total) by mouth daily. 90 tablet 3  . SYNTHROID 50 MCG tablet TAKE 1 TABLET DAILY 90 tablet 3  . cefUROXime (CEFTIN) 250 MG tablet Take 1 tablet (250 mg total) by mouth 2 (two) times daily with a meal. (Patient not taking: Reported on 11/02/2020) 20 tablet 0   No facility-administered medications prior to visit.    ROS: Review of Systems  Constitutional: Negative for activity change,  appetite change, chills, fatigue, fever and unexpected weight change.  HENT: Negative for congestion, mouth sores and sinus pressure.   Eyes: Negative for visual disturbance.  Respiratory: Negative for cough and chest tightness.   Gastrointestinal: Positive for abdominal pain, diarrhea, nausea and vomiting. Negative for anal bleeding.  Genitourinary: Negative for difficulty urinating, frequency and vaginal pain.  Musculoskeletal: Positive for gait problem. Negative for back pain.  Skin: Negative for pallor and rash.  Neurological: Negative for dizziness, tremors, weakness, numbness and headaches.  Psychiatric/Behavioral: Positive for confusion. Negative for sleep disturbance.    Objective:  BP 128/70 (BP Location: Left Arm)   Pulse 93   Temp 98.3 F (36.8 C) (Oral)   Wt 135 lb 3.2 oz (61.3 kg)   SpO2 94%   BMI 24.73 kg/m   BP Readings from Last 3 Encounters:  11/02/20 128/70  08/02/20 124/72  07/06/20 104/62    Wt Readings from Last 3 Encounters:  11/02/20 135 lb 3.2 oz (61.3 kg)  08/02/20 134 lb (60.8 kg)  07/06/20 132 lb (59.9 kg)    Physical Exam Constitutional:      General: She is not in acute distress.    Appearance: Normal appearance. She is well-developed.  HENT:     Head: Normocephalic.     Right  Ear: External ear normal.     Left Ear: External ear normal.     Nose: Nose normal.     Mouth/Throat:     Mouth: Oropharynx is clear and moist.  Eyes:     General:        Right eye: No discharge.        Left eye: No discharge.     Conjunctiva/sclera: Conjunctivae normal.     Pupils: Pupils are equal, round, and reactive to light.  Neck:     Thyroid: No thyromegaly.     Vascular: No JVD.     Trachea: No tracheal deviation.  Cardiovascular:     Rate and Rhythm: Normal rate and regular rhythm.     Heart sounds: Normal heart sounds.  Pulmonary:     Effort: No respiratory distress.     Breath sounds: No stridor. No wheezing.  Abdominal:     General: Bowel  sounds are normal. There is no distension.     Palpations: Abdomen is soft. There is no mass.     Tenderness: There is abdominal tenderness. There is no guarding or rebound.  Musculoskeletal:        General: No tenderness or edema.     Cervical back: Normal range of motion and neck supple.  Lymphadenopathy:     Cervical: No cervical adenopathy.  Skin:    Findings: No erythema or rash.  Neurological:     Cranial Nerves: No cranial nerve deficit.     Motor: No abnormal muscle tone.     Coordination: Coordination normal.     Deep Tendon Reflexes: Reflexes normal.  Psychiatric:        Mood and Affect: Mood and affect normal.        Behavior: Behavior normal.      LLQ is tender, no rebound  Lab Results  Component Value Date   WBC 9.6 07/06/2020   HGB 15.9 (H) 07/06/2020   HCT 47.4 (H) 07/06/2020   PLT 157 07/06/2020   GLUCOSE 111 (H) 07/06/2020   CHOL 155 11/15/2017   TRIG 206.0 (H) 11/15/2017   HDL 40.30 11/15/2017   LDLDIRECT 90.0 11/15/2017   LDLCALC 70 12/16/2013   ALT 17 07/06/2020   AST 19 07/06/2020   NA 143 07/06/2020   K 3.9 07/06/2020   CL 102 07/06/2020   CREATININE 0.90 (H) 07/06/2020   BUN 19 07/06/2020   CO2 31 07/06/2020   TSH 1.50 01/07/2019   INR 1.15 08/10/2011   HGBA1C 5.8 10/12/2011    VAS [place] LOWER EXTREMITY VENOUS (DVT)  Result Date: 07/09/2020  Lower Venous DVTStudy Indications: Bilateral feet edema left > right. Patient denies SOB or chest pains.  Comparison Study: None Performing Technologist: Alecia Mackin RVT, RDCS (AE), RDMS  Examination Guidelines: A complete evaluation includes B-mode imaging, spectral Doppler, color Doppler, and power Doppler as needed of all accessible portions of each vessel. Bilateral testing is considered an integral part of a complete examination. Limited examinations for reoccurring indications may be performed as noted. The reflux portion of the exam is performed with the patient in reverse Trendelenburg.   +---------+---------------+---------+-----------+----------+--------------+ RIGHT    CompressibilityPhasicitySpontaneityPropertiesThrombus Aging +---------+---------------+---------+-----------+----------+--------------+ CFV      Full           Yes      Yes                                 +---------+---------------+---------+-----------+----------+--------------+  SFJ      Full           Yes      Yes                                 +---------+---------------+---------+-----------+----------+--------------+ FV Prox  Full           Yes      Yes                                 +---------+---------------+---------+-----------+----------+--------------+ FV Mid   Full           Yes      Yes                                 +---------+---------------+---------+-----------+----------+--------------+ FV DistalFull           Yes      Yes                                 +---------+---------------+---------+-----------+----------+--------------+ PFV      Full                                                        +---------+---------------+---------+-----------+----------+--------------+ POP      Full           Yes      Yes                                 +---------+---------------+---------+-----------+----------+--------------+ PTV      Full           Yes      Yes                                 +---------+---------------+---------+-----------+----------+--------------+ PERO     Full           Yes      Yes                                 +---------+---------------+---------+-----------+----------+--------------+ Gastroc  Full                                                        +---------+---------------+---------+-----------+----------+--------------+ GSV      Full           Yes      Yes                                 +---------+---------------+---------+-----------+----------+--------------+    +---------+---------------+---------+-----------+----------+--------------+ LEFT     CompressibilityPhasicitySpontaneityPropertiesThrombus Aging +---------+---------------+---------+-----------+----------+--------------+ CFV      Full           Yes  Yes                                 +---------+---------------+---------+-----------+----------+--------------+ SFJ      Full           Yes      Yes                                 +---------+---------------+---------+-----------+----------+--------------+ FV Prox  Full           Yes      Yes                                 +---------+---------------+---------+-----------+----------+--------------+ FV Mid   Full           Yes      Yes                                 +---------+---------------+---------+-----------+----------+--------------+ FV DistalFull           Yes      Yes                                 +---------+---------------+---------+-----------+----------+--------------+ PFV      Full                                                        +---------+---------------+---------+-----------+----------+--------------+ POP      Full           Yes      Yes                                 +---------+---------------+---------+-----------+----------+--------------+ PTV      Full           Yes      Yes                                 +---------+---------------+---------+-----------+----------+--------------+ PERO     Full           Yes      Yes                                 +---------+---------------+---------+-----------+----------+--------------+ Gastroc  Full                                                        +---------+---------------+---------+-----------+----------+--------------+ GSV      Full           Yes      Yes                                 +---------+---------------+---------+-----------+----------+--------------+  Findings reported to Marvis Repress Murray's  email though [employer] at 3:15 pm.  Summary: BILATERAL: - No evidence of deep vein thrombosis seen in the lower extremities, bilaterally. - RIGHT: - No cystic structure found in the popliteal fossa.  LEFT: - No cystic structure found in the popliteal fossa.  *See table(s) above for measurements and observations. Electronically signed by Larae Grooms MD on 07/09/2020 at 6:10:48 PM.    Final     Assessment & Plan:   There are no diagnoses linked to this encounter.   No orders of the defined types were placed in this encounter.    Follow-up: No follow-ups on file.  [name], MD

## 2020-11-02 NOTE — Patient Instructions (Signed)
[facility] in [place], [place] Address: [address], [place], [place] [address]  Phone: [phone]    Diverticulitis  Diverticulitis is infection or inflammation of small pouches (diverticula) in the colon that form due to a condition called diverticulosis. Diverticula can trap stool (feces) and bacteria, causing infection and inflammation. Diverticulitis may cause severe stomach pain and diarrhea. It may lead to tissue damage in the colon that causes bleeding or blockage. The diverticula may also burst (rupture) and cause infected stool to enter other areas of the abdomen. What are the causes? This condition is caused by stool becoming trapped in the diverticula, which allows bacteria to grow in the diverticula. This leads to inflammation and infection. What increases the risk? You are more likely to develop this condition if you have diverticulosis. The risk increases if you:  Are overweight or obese.  Do not get enough exercise.  Drink alcohol.  Use tobacco products.  Eat a diet that has a lot of red meat such as beef, pork, or lamb.  Eat a diet that does not include enough fiber. High-fiber foods include fruits, vegetables, beans, nuts, and whole grains.  Are over 70 years of age. What are the signs or symptoms? Symptoms of this condition may include:  Pain and tenderness in the abdomen. The pain is normally located on the left side of the abdomen, but it may occur in other areas.  Fever and chills.  Nausea.  Vomiting.  Cramping.  Bloating.  Changes in bowel routines.  Blood in your stool. How is this diagnosed? This condition is diagnosed based on:  Your medical history.  A physical exam.  Tests to make sure there is nothing else causing your condition. These tests may include: ? Blood tests. ? Urine tests. ? CT scan of the abdomen. How is this treated? Most cases of this condition are mild and can  be treated at home. Treatment may include:  Taking over-the-counter pain medicines.  Following a clear liquid diet.  Taking antibiotic medicines by mouth.  Resting. More severe cases may need to be treated at a hospital. Treatment may include:  Not eating or drinking.  Taking prescription pain medicine.  Receiving antibiotic medicines through an IV.  Receiving fluids and nutrition through an IV.  Surgery. When your condition is under control, your health care provider may recommend that you have a colonoscopy. This is an exam to look at the entire large intestine. During the exam, a lubricated, bendable tube is inserted into the anus and then passed into the rectum, colon, and other parts of the large intestine. A colonoscopy can show how severe your diverticula are and whether something else may be causing your symptoms. Follow these instructions at home: Medicines  Take over-the-counter and prescription medicines only as told by your health care provider. These include fiber supplements, probiotics, and stool softeners.  If you were prescribed an antibiotic medicine, take it as told by your health care provider. Do not stop taking the antibiotic even if you start to feel better.  Ask your health care provider if the medicine prescribed to you requires you to avoid driving or using machinery. Eating and drinking  Follow a full liquid diet or another diet as directed by your health care provider.  After your symptoms improve, your health care provider may tell you to change your diet. He or she may recommend that you eat a diet that contains at least 25 grams (  25 g) of fiber daily. Fiber makes it easier to pass stool. Healthy sources of fiber include: ? Berries. One cup contains 4-8 grams of fiber. ? Beans or lentils. One-half cup contains 5-8 grams of fiber. ? Green vegetables. One cup contains 4 grams of fiber.  Avoid eating red meat.   General instructions  Do not use any  products that contain nicotine or tobacco, such as cigarettes, e-cigarettes, and chewing tobacco. If you need help quitting, ask your health care provider.  Exercise for at least 30 minutes, 3 times each week. You should exercise hard enough to raise your heart rate and break a sweat.  Keep all follow-up visits as told by your health care provider. This is important. You may need to have a colonoscopy. Contact a health care provider if:  Your pain does not improve.  Your bowel movements do not return to normal. Get help right away if:  Your pain gets worse.  Your symptoms do not get better with treatment.  Your symptoms suddenly get worse.  You have a fever.  You vomit more than one time.  You have stools that are bloody, black, or tarry. Summary  Diverticulitis is infection or inflammation of small pouches (diverticula) in the colon that form due to a condition called diverticulosis. Diverticula can trap stool (feces) and bacteria, causing infection and inflammation.  You are at higher risk for this condition if you have diverticulosis and you eat a diet that does not include enough fiber.  Most cases of this condition are mild and can be treated at home. More severe cases may need to be treated at a hospital.  When your condition is under control, your health care provider may recommend that you have an exam called a colonoscopy. This exam can show how severe your diverticula are and whether something else may be causing your symptoms.  Keep all follow-up visits as told by your health care provider. This is important. This information is not intended to replace advice given to you by your health care provider. Make sure you discuss any questions you have with your health care provider. Document Revised: 07/07/2019 Document Reviewed: 07/07/2019 Elsevier Patient Education  2021 [employer].

## 2020-11-02 NOTE — Assessment & Plan Note (Signed)
Start Cipro, Flagyl po Diet discussed Treat nausea, diarrhea

## 2020-11-02 NOTE — Telephone Encounter (Signed)
Patients husband states that Dr. Alain Marion told him to start only one of the two new medications today and he wasn't sure which it was

## 2020-11-03 ENCOUNTER — Telehealth: Payer: Self-pay | Admitting: Internal Medicine

## 2020-11-03 MED ORDER — ONDANSETRON HCL 4 MG PO TABS
4.0000 mg | ORAL_TABLET | Freq: Four times a day (QID) | ORAL | 1 refills | Status: DC | PRN
Start: 2020-11-03 — End: 2023-12-27

## 2020-11-03 NOTE — Telephone Encounter (Signed)
[facility] pharmacy called and is requesting a call back in regards to ondansetron (ZOFRAN) 4 MG tablet. They are wanting instructions on how the patient should be taking the medication.

## 2020-11-03 NOTE — Telephone Encounter (Signed)
Called pharmacy spoke w/pharmacist she states there was 2 sets of directions. Needing to know if its every 8 or 6 hours. Inform pharmacist should have been every 6 hrs as needed. Updated med list../lmb

## 2020-12-01 ENCOUNTER — Other Ambulatory Visit: Payer: Self-pay | Admitting: Internal Medicine

## 2021-01-20 ENCOUNTER — Other Ambulatory Visit: Payer: Self-pay | Admitting: Internal Medicine

## 2021-01-28 ENCOUNTER — Other Ambulatory Visit: Payer: Self-pay | Admitting: Internal Medicine

## 2021-01-31 ENCOUNTER — Other Ambulatory Visit: Payer: Self-pay

## 2021-02-01 ENCOUNTER — Encounter: Payer: Self-pay | Admitting: Internal Medicine

## 2021-02-01 ENCOUNTER — Ambulatory Visit (INDEPENDENT_AMBULATORY_CARE_PROVIDER_SITE_OTHER): Payer: Medicare Other | Admitting: Internal Medicine

## 2021-02-01 VITALS — BP 132/60 | HR 95 | Temp 98.0°F | Ht 62.0 in | Wt 137.0 lb

## 2021-02-01 DIAGNOSIS — R413 Other amnesia: Secondary | ICD-10-CM

## 2021-02-01 DIAGNOSIS — I700 Atherosclerosis of aorta: Secondary | ICD-10-CM

## 2021-02-01 DIAGNOSIS — F0280 Dementia in other diseases classified elsewhere without behavioral disturbance: Secondary | ICD-10-CM

## 2021-02-01 DIAGNOSIS — K219 Gastro-esophageal reflux disease without esophagitis: Secondary | ICD-10-CM | POA: Diagnosis not present

## 2021-02-01 DIAGNOSIS — R197 Diarrhea, unspecified: Secondary | ICD-10-CM | POA: Diagnosis not present

## 2021-02-01 DIAGNOSIS — G301 Alzheimer's disease with late onset: Secondary | ICD-10-CM | POA: Diagnosis not present

## 2021-02-01 DIAGNOSIS — I10 Essential (primary) hypertension: Secondary | ICD-10-CM

## 2021-02-01 DIAGNOSIS — J449 Chronic obstructive pulmonary disease, unspecified: Secondary | ICD-10-CM | POA: Diagnosis not present

## 2021-02-01 LAB — CBC WITH DIFFERENTIAL/PLATELET
Basophils Absolute: 0 10*3/uL (ref 0.0–0.1)
Basophils Relative: 0.3 % (ref 0.0–3.0)
Eosinophils Absolute: 0.1 10*3/uL (ref 0.0–0.7)
Eosinophils Relative: 1 % (ref 0.0–5.0)
HCT: 46.4 % — ABNORMAL HIGH (ref 36.0–46.0)
Hemoglobin: 15.6 g/dL — ABNORMAL HIGH (ref 12.0–15.0)
Lymphocytes Relative: 38.3 % (ref 12.0–46.0)
Lymphs Abs: 3.7 10*3/uL (ref 0.7–4.0)
MCHC: 33.7 g/dL (ref 30.0–36.0)
MCV: 88.8 fl (ref 78.0–100.0)
Monocytes Absolute: 0.6 10*3/uL (ref 0.1–1.0)
Monocytes Relative: 6.2 % (ref 3.0–12.0)
Neutro Abs: 5.2 10*3/uL (ref 1.4–7.7)
Neutrophils Relative %: 54.2 % (ref 43.0–77.0)
Platelets: 138 10*3/uL — ABNORMAL LOW (ref 150.0–400.0)
RBC: 5.22 Mil/uL — ABNORMAL HIGH (ref 3.87–5.11)
RDW: 13.4 % (ref 11.5–15.5)
WBC: 9.6 10*3/uL (ref 4.0–10.5)

## 2021-02-01 LAB — COMPREHENSIVE METABOLIC PANEL
ALT: 28 U/L (ref 0–35)
AST: 28 U/L (ref 0–37)
Albumin: 4 g/dL (ref 3.5–5.2)
Alkaline Phosphatase: 60 U/L (ref 39–117)
BUN: 19 mg/dL (ref 6–23)
CO2: 31 mEq/L (ref 19–32)
Calcium: 9.5 mg/dL (ref 8.4–10.5)
Chloride: 101 mEq/L (ref 96–112)
Creatinine, Ser: 0.96 mg/dL (ref 0.40–1.20)
GFR: 54.84 mL/min — ABNORMAL LOW (ref >60.00)
Glucose, Bld: 134 mg/dL — ABNORMAL HIGH (ref 70–99)
Potassium: 3.6 mEq/L (ref 3.5–5.1)
Sodium: 143 mEq/L (ref 135–145)
Total Bilirubin: 0.6 mg/dL (ref 0.2–1.2)
Total Protein: 6.6 g/dL (ref 6.0–8.3)

## 2021-02-01 LAB — TSH: TSH: 2.54 u[IU]/mL (ref 0.35–4.50)

## 2021-02-01 NOTE — Assessment & Plan Note (Signed)
On Simvastatin 

## 2021-02-01 NOTE — Assessment & Plan Note (Signed)
On Incruse ellipta

## 2021-02-01 NOTE — Progress Notes (Addendum)
Subjective:  Patient ID: [name], female    DOB: 30-Jul-1936  Age: 85 y.o. MRN: [id number]  CC: Follow-up (3 month f/u)   HPI [name] presents for dementia - worse, COPD, HTN Pt is dizzy standing up, no falls.  Outpatient Medications Prior to Visit  Medication Sig Dispense Refill  . acetaminophen (TYLENOL) 325 MG tablet Take 650 mg by mouth every 6 (six) hours as needed.    Marland Kitchen aspirin 81 MG EC tablet Take 81 mg by mouth daily.    . Cholecalciferol (VITAMIN D3) 2000 units capsule Take 1 capsule (2,000 Units total) daily by mouth. 100 capsule 3  . Diclofenac Sodium 1.5 % SOLN Place 3-5 drops onto the skin 3 (three) times daily as needed. For pain applies on joints    . donepezil (ARICEPT) 10 MG tablet TAKE 1 TABLET AT BEDTIME 90 tablet 3  . furosemide (LASIX) 20 MG tablet TAKE 2 TABLETS DAILY 180 tablet 3  . INCRUSE ELLIPTA 62.5 MCG/INH AEPB USE 1 INHALATION DAILY 90 each 3  . losartan (COZAAR) 50 MG tablet Take 1 tablet (50 mg total) by mouth at bedtime. 90 tablet 3  . memantine (NAMENDA) 10 MG tablet Take 1 tablet (10 mg total) by mouth 2 (two) times daily. Annual appt due in April must see provider for future refills 180 tablet 0  . Multiple Vitamin (MULTIVITAMIN) tablet Take 1 tablet by mouth daily.    Marland Kitchen omeprazole (PRILOSEC) 40 MG capsule TAKE 1 CAPSULE DAILY 90 capsule 3  . ondansetron (ZOFRAN) 4 MG tablet Take 1 tablet (4 mg total) by mouth every 6 (six) hours as needed. Take  1 tab every 6 hours as needed for nausea and vomiting. 20 tablet 1  . simvastatin (ZOCOR) 40 MG tablet Take 1 tablet (40 mg total) by mouth daily. 90 tablet 3  . SYNTHROID 50 MCG tablet Take 1 tablet (50 mcg total) by mouth daily. Annual appt is due w/labs must see provider for future refills 90 tablet 0  . ciprofloxacin (CIPRO) 500 MG tablet Take 1 tablet (500 mg total) by mouth 2 (two) times daily. (Patient not taking: Reported on 02/01/2021) 20 tablet 0  . methylPREDNISolone  (MEDROL DOSEPAK) 4 MG TBPK tablet As directed (Patient not taking: Reported on 02/01/2021) 21 tablet 0  . metroNIDAZOLE (FLAGYL) 500 MG tablet Take 1 tablet (500 mg total) by mouth 3 (three) times daily. (Patient not taking: Reported on 02/01/2021) 21 tablet 0   No facility-administered medications prior to visit.    ROS: Review of Systems  Constitutional: Negative for activity change, appetite change, chills, fatigue and unexpected weight change.  HENT: Negative for congestion, mouth sores and sinus pressure.   Eyes: Negative for visual disturbance.  Respiratory: Positive for wheezing. Negative for cough and chest tightness.   Gastrointestinal: Negative for abdominal pain and nausea.  Genitourinary: Negative for difficulty urinating, frequency and vaginal pain.  Musculoskeletal: Negative for back pain and gait problem.  Skin: Negative for pallor and rash.  Neurological: Negative for dizziness, tremors, weakness, numbness and headaches.  Psychiatric/Behavioral: Positive for confusion and decreased concentration. Negative for sleep disturbance.    Objective:  BP 132/60 (BP Location: Left Arm)   Pulse 95   Temp 98 F (36.7 C) (Oral)   Ht 5\' 2"  (1.575 m)   Wt 137 lb (62.1 kg)   SpO2 94%   BMI 25.06 kg/m   BP Readings from Last 3 Encounters:  02/01/21 132/60  11/02/20 128/70  08/02/20  124/72    Wt Readings from Last 3 Encounters:  02/01/21 137 lb (62.1 kg)  11/02/20 135 lb 3.2 oz (61.3 kg)  08/02/20 134 lb (60.8 kg)    Physical Exam Constitutional:      General: She is not in acute distress.    Appearance: She is well-developed.  HENT:     Head: Normocephalic.     Right Ear: External ear normal.     Left Ear: External ear normal.     Nose: Nose normal.  Eyes:     General:        Right eye: No discharge.        Left eye: No discharge.     Conjunctiva/sclera: Conjunctivae normal.     Pupils: Pupils are equal, round, and reactive to light.  Neck:     Thyroid: No  thyromegaly.     Vascular: No JVD.     Trachea: No tracheal deviation.  Cardiovascular:     Rate and Rhythm: Normal rate and regular rhythm.     Heart sounds: Normal heart sounds.  Pulmonary:     Effort: No respiratory distress.     Breath sounds: No stridor. No wheezing.  Abdominal:     General: Bowel sounds are normal. There is no distension.     Palpations: Abdomen is soft. There is no mass.     Tenderness: There is no abdominal tenderness. There is no guarding or rebound.  Musculoskeletal:        General: No tenderness.     Cervical back: Normal range of motion and neck supple.  Lymphadenopathy:     Cervical: No cervical adenopathy.  Skin:    Findings: No erythema or rash.  Neurological:     Mental Status: She is disoriented.     Cranial Nerves: No cranial nerve deficit.     Motor: No abnormal muscle tone.     Coordination: Coordination normal.     Gait: Gait normal.     Deep Tendon Reflexes: Reflexes normal.  Psychiatric:        Mood and Affect: Mood normal.        Behavior: Behavior normal.   dizzy standing up Using no cane or a  walker  Lab Results  Component Value Date   WBC 9.6 07/06/2020   HGB 15.9 (H) 07/06/2020   HCT 47.4 (H) 07/06/2020   PLT 157 07/06/2020   GLUCOSE 111 (H) 07/06/2020   CHOL 155 11/15/2017   TRIG 206.0 (H) 11/15/2017   HDL 40.30 11/15/2017   LDLDIRECT 90.0 11/15/2017   LDLCALC 70 12/16/2013   ALT 17 07/06/2020   AST 19 07/06/2020   NA 143 07/06/2020   K 3.9 07/06/2020   CL 102 07/06/2020   CREATININE 0.90 (H) 07/06/2020   BUN 19 07/06/2020   CO2 31 07/06/2020   TSH 1.50 01/07/2019   INR 1.15 08/10/2011   HGBA1C 5.8 10/12/2011    VAS [place] LOWER EXTREMITY VENOUS (DVT)  Result Date: 07/09/2020  Lower Venous DVTStudy Indications: Bilateral feet edema left > right. Patient denies SOB or chest pains.  Comparison Study: None Performing Technologist: Alecia Mackin RVT, RDCS (AE), RDMS  Examination Guidelines: A complete evaluation  includes B-mode imaging, spectral Doppler, color Doppler, and power Doppler as needed of all accessible portions of each vessel. Bilateral testing is considered an integral part of a complete examination. Limited examinations for reoccurring indications may be performed as noted. The reflux portion of the exam is performed with the patient in  reverse Trendelenburg.  +---------+---------------+---------+-----------+----------+--------------+ RIGHT    CompressibilityPhasicitySpontaneityPropertiesThrombus Aging +---------+---------------+---------+-----------+----------+--------------+ CFV      Full           Yes      Yes                                 +---------+---------------+---------+-----------+----------+--------------+ SFJ      Full           Yes      Yes                                 +---------+---------------+---------+-----------+----------+--------------+ FV Prox  Full           Yes      Yes                                 +---------+---------------+---------+-----------+----------+--------------+ FV Mid   Full           Yes      Yes                                 +---------+---------------+---------+-----------+----------+--------------+ FV DistalFull           Yes      Yes                                 +---------+---------------+---------+-----------+----------+--------------+ PFV      Full                                                        +---------+---------------+---------+-----------+----------+--------------+ POP      Full           Yes      Yes                                 +---------+---------------+---------+-----------+----------+--------------+ PTV      Full           Yes      Yes                                 +---------+---------------+---------+-----------+----------+--------------+ PERO     Full           Yes      Yes                                  +---------+---------------+---------+-----------+----------+--------------+ Gastroc  Full                                                        +---------+---------------+---------+-----------+----------+--------------+ GSV      Full           Yes  Yes                                 +---------+---------------+---------+-----------+----------+--------------+   +---------+---------------+---------+-----------+----------+--------------+ LEFT     CompressibilityPhasicitySpontaneityPropertiesThrombus Aging +---------+---------------+---------+-----------+----------+--------------+ CFV      Full           Yes      Yes                                 +---------+---------------+---------+-----------+----------+--------------+ SFJ      Full           Yes      Yes                                 +---------+---------------+---------+-----------+----------+--------------+ FV Prox  Full           Yes      Yes                                 +---------+---------------+---------+-----------+----------+--------------+ FV Mid   Full           Yes      Yes                                 +---------+---------------+---------+-----------+----------+--------------+ FV DistalFull           Yes      Yes                                 +---------+---------------+---------+-----------+----------+--------------+ PFV      Full                                                        +---------+---------------+---------+-----------+----------+--------------+ POP      Full           Yes      Yes                                 +---------+---------------+---------+-----------+----------+--------------+ PTV      Full           Yes      Yes                                 +---------+---------------+---------+-----------+----------+--------------+ PERO     Full           Yes      Yes                                  +---------+---------------+---------+-----------+----------+--------------+ Gastroc  Full                                                        +---------+---------------+---------+-----------+----------+--------------+  GSV      Full           Yes      Yes                                 +---------+---------------+---------+-----------+----------+--------------+    Findings reported to Marvis Repress Murray's email though EPIC at 3:15 pm.  Summary: BILATERAL: - No evidence of deep vein thrombosis seen in the lower extremities, bilaterally. - RIGHT: - No cystic structure found in the popliteal fossa.  LEFT: - No cystic structure found in the popliteal fossa.  *See table(s) above for measurements and observations. Electronically signed by Larae Grooms MD on 07/09/2020 at 6:10:48 PM.    Final     Assessment & Plan:    [name], MD

## 2021-02-01 NOTE — Assessment & Plan Note (Signed)
On Aricept and Namenda po F/u w/Dr Tomi Likens

## 2021-02-01 NOTE — Assessment & Plan Note (Signed)
On Omeprazole 

## 2021-02-01 NOTE — Assessment & Plan Note (Addendum)
On Losartan, Furosemide Pt is dizzy standing up, no falls. Reduce Lasix to qd

## 2021-02-01 NOTE — Assessment & Plan Note (Signed)
On Aricept and Namenda 

## 2021-02-01 NOTE — Addendum Note (Signed)
Addended by: Boris Lown B on: 02/01/2021 11:36 AM   Modules accepted: Orders

## 2021-02-02 ENCOUNTER — Ambulatory Visit: Payer: Medicare Other | Attending: Internal Medicine

## 2021-02-02 DIAGNOSIS — Z23 Encounter for immunization: Secondary | ICD-10-CM

## 2021-02-02 NOTE — Progress Notes (Signed)
   Covid-19 Vaccination Clinic  Name:  [name]    MRN: [id number] DOB: 11-01-1935  02/02/2021  [name] was observed post Covid-19 immunization for 15 minutes without incident. She was provided with Vaccine Information Sheet and instruction to access the V-Safe system.   [name] was instructed to call 911 with any severe reactions post vaccine: Marland Kitchen Difficulty breathing  . Swelling of face and throat  . A fast heartbeat  . A bad rash all over body  . Dizziness and weakness   Immunizations Administered    Name Date Dose VIS Date Route   PFIZER Comrnaty(Gray TOP) Covid-19 Vaccine 02/02/2021 10:40 AM 0.3 mL 09/16/2020 Intramuscular   Manufacturer: [employer], [employer]   Lot: [id number]   NDC: [phone]

## 2021-02-07 ENCOUNTER — Other Ambulatory Visit (HOSPITAL_COMMUNITY): Payer: Self-pay

## 2021-02-07 MED ORDER — COVID-19 MRNA VAC-TRIS(PFIZER) 30 MCG/0.3ML IM SUSP
INTRAMUSCULAR | 0 refills | Status: DC
  Filled 2021-02-07: qty 0.3, 17d supply, fill #0

## 2021-03-01 ENCOUNTER — Other Ambulatory Visit: Payer: Self-pay | Admitting: Internal Medicine

## 2021-03-23 ENCOUNTER — Other Ambulatory Visit: Payer: Self-pay | Admitting: Internal Medicine

## 2021-04-01 DIAGNOSIS — Z961 Presence of intraocular lens: Secondary | ICD-10-CM | POA: Diagnosis not present

## 2021-04-01 DIAGNOSIS — H353131 Nonexudative age-related macular degeneration, bilateral, early dry stage: Secondary | ICD-10-CM | POA: Diagnosis not present

## 2021-04-01 DIAGNOSIS — H524 Presbyopia: Secondary | ICD-10-CM | POA: Diagnosis not present

## 2021-04-01 DIAGNOSIS — H02139 Senile ectropion of unspecified eye, unspecified eyelid: Secondary | ICD-10-CM | POA: Diagnosis not present

## 2021-04-12 NOTE — Progress Notes (Deleted)
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  She is accompanied by her husband who supplements history.   UPDATE: She is taking Aricept 10mg  daily and Namenda 10mg  twice daily.   Memory loss has slightly progressed.  She has trouble with some names.  She is able to dress, bathe and use the toilet herself.  She doesn't keep the house tidy as she used to.  She does not cook or drive.  She sleeps well at night but also often during the day. She does not get agitated or combative.  She is not hallucinating or delusional.  She denies depression. For past 3 months, she has had upset stomach.  She feels queasy.  She vomited in her plate at the dinner table.     HISTORY: Her husband began to notice changes in memory around 2012 or 2013.  She would have problems with short-term memory.  She would forget about phone calls.  She would repeat questions. Her husband usually goes to the grocery store.  She has no difficulty remembering names or faces.  She has no hallucinations or delusions.  She sleeps well.  She is not depressed.  There has been no change in personality or behavior.  She is able to perform all her ADLs.  She has gotten lost while driving.  During the day, she tends to the house but spends time watching TV.  She reads the Bible but not everyday.  She and her husband are active in the church.  They go to choir practice every Wednesday night.  They participate in a senior group once a month.  Her mother and maternal aunt had dementia.   She has history of nausea and mild anorexia.  No vomiting.  She had been on both Aricept and  rivastigmine at one time and were discontinued with concern that it was causing the nausea.  However, the nausea never resolved.  She has been evaluated by GI with no clear etiology.  She was subsequently restarted on Aricept and then Namenda.   11/05/13 CT HEAD:  no bleed, mass lesions or acute infarcts seen.  Mild atrophy and atherosclerotic changes noted.  PAST MEDICAL HISTORY: Past Medical History:  Diagnosis Date   Anxiety    COPD (chronic obstructive pulmonary disease) ([place])    Dementia ([place])    Diverticulosis of colon (without mention of hemorrhage)    Gallstones    GERD (gastroesophageal reflux disease)    Hiatal hernia    Hyperlipidemia    Hypertension    Hypothyroidism    Low back pain    OA R radiculopathy   Lower extremity edema    chronic   Osteopenia    Peptic ulcer    Peripheral vascular disease ([place])    Personal history of colonic polyps 2001   TUBULAR ADENOMA - [place]   Pneumonia     MEDICATIONS: Current Outpatient Medications on File Prior to Visit  Medication Sig Dispense Refill   acetaminophen (TYLENOL) 325 MG tablet Take 650 mg by mouth every 6 (six) hours as needed.     aspirin 81 MG EC tablet Take 81 mg by mouth daily.     Cholecalciferol (VITAMIN D3) 2000 units  capsule Take 1 capsule (2,000 Units total) daily by mouth. 100 capsule 3   COVID-19 mRNA Vac-TriS, Pfizer, SUSP injection Inject into the muscle. 0.3 mL 0   Diclofenac Sodium 1.5 % SOLN Place 3-5 drops onto the skin 3 (three) times daily as needed. For pain applies on joints     donepezil (ARICEPT) 10 MG tablet TAKE 1 TABLET AT BEDTIME 90 tablet 3   furosemide (LASIX) 20 MG tablet TAKE 2 TABLETS DAILY 180 tablet 3   INCRUSE ELLIPTA 62.5 MCG/INH AEPB USE 1 INHALATION DAILY 90 each 3   losartan (COZAAR) 50 MG tablet TAKE 1 TABLET IN THE MORNING AND AT BEDTIME 180 tablet 0   memantine (NAMENDA) 10 MG tablet Take 1 tablet (10 mg total) by mouth 2 (two) times daily. 180 tablet 3   Multiple Vitamin  (MULTIVITAMIN) tablet Take 1 tablet by mouth daily.     omeprazole (PRILOSEC) 40 MG capsule TAKE 1 CAPSULE DAILY 90 capsule 3   ondansetron (ZOFRAN) 4 MG tablet Take 1 tablet (4 mg total) by mouth every 6 (six) hours as needed. Take  1 tab every 6 hours as needed for nausea and vomiting. 20 tablet 1   simvastatin (ZOCOR) 40 MG tablet TAKE 1 TABLET DAILY 90 tablet 0   SYNTHROID 50 MCG tablet Take 1 tablet (50 mcg total) by mouth daily. Annual appt is due w/labs must see provider for future refills 90 tablet 0   No current facility-administered medications on file prior to visit.    ALLERGIES: Allergies  Allergen Reactions   Codeine Sulfate Other (See Comments)    Extreme headaches   Exelon [Rivastigmine Tartrate]     nausea   Oxycodone-Acetaminophen Other (See Comments)    Extreme headaches    FAMILY HISTORY: Family History  Problem Relation Age of Onset   Dementia Mother    Hypertension Father    Heart disease Father    Colon cancer Sister    Heart disease Sister    Esophageal cancer Neg Hx    Rectal cancer Neg Hx    Stomach cancer Neg Hx       Objective:  *** General: No acute distress.  Patient appears ***-groomed.   Head:  Normocephalic/atraumatic Eyes:  Fundi examined but not visualized Neck: supple, no paraspinal tenderness, full range of motion Heart:  Regular rate and rhythm Lungs:  Clear to auscultation bilaterally Back: No paraspinal tenderness Neurological Exam: alert and oriented to person, place, and time.  Speech fluent and not dysarthric, language intact.  CN II-XII intact. Bulk and tone normal, muscle strength 5/5 throughout.  Sensation to light touch intact.  Deep tendon reflexes 2+ throughout, toes downgoing.  Finger to nose testing intact.  Gait normal, Romberg negative.   Metta Clines, DO  CC: ***

## 2021-04-13 ENCOUNTER — Ambulatory Visit: Payer: Medicare Other | Admitting: Neurology

## 2021-04-20 ENCOUNTER — Other Ambulatory Visit: Payer: Self-pay | Admitting: Internal Medicine

## 2021-04-28 ENCOUNTER — Ambulatory Visit (INDEPENDENT_AMBULATORY_CARE_PROVIDER_SITE_OTHER): Payer: Medicare Other | Admitting: Neurology

## 2021-04-28 ENCOUNTER — Other Ambulatory Visit: Payer: Self-pay

## 2021-04-28 ENCOUNTER — Encounter: Payer: Self-pay | Admitting: Neurology

## 2021-04-28 VITALS — BP 135/82 | HR 101 | Ht 62.0 in | Wt 141.4 lb

## 2021-04-28 DIAGNOSIS — F0280 Dementia in other diseases classified elsewhere without behavioral disturbance: Secondary | ICD-10-CM

## 2021-04-28 DIAGNOSIS — G301 Alzheimer's disease with late onset: Secondary | ICD-10-CM

## 2021-04-28 NOTE — Patient Instructions (Signed)
- 

## 2021-04-28 NOTE — Progress Notes (Signed)
NEUROLOGY FOLLOW UP OFFICE NOTE  [name] [id number]  Assessment/Plan:   Major neurocognitive disorder due to Alzheimer's  Donepezil 10mg  QHS and memantine 10mg  BID Follow up one year  Subjective:  [name] is an 85 year old right-handed female with GERD, peripheral vascular disease, hyperlipidemia, COPD, hypertension, hypothyroidism, and anxiety who follows up for Alzheimer's dementia.  She is accompanied by her husband who supplements history.   UPDATE: She is taking Aricept 10mg  daily and Namenda 10mg  twice daily.   Memory loss has slightly progressed.  She has trouble with some names.  She is able to dress, bathe and use the toilet herself.  She doesn't keep the house tidy as she used to.  She does not cook or drive.  Naps often but still able to sleep at night.. She does not get agitated or combative.  She is not hallucinating or delusional.  She denies depression.    HISTORY: Her husband began to notice changes in memory around 2012 or 2013.  She would have problems with short-term memory.  She would forget about phone calls.  She would repeat questions. Her husband usually goes to the grocery store.  She has no difficulty remembering names or faces.  She has no hallucinations or delusions.  She sleeps well.  She is not depressed.  There has been no change in personality or behavior.  She is able to perform all her ADLs.  She has gotten lost while driving.  During the day, she tends to the house but spends time watching TV.  She reads the Bible but not everyday.  She and her husband are active in the church.  They go to choir practice every Wednesday night.  They participate in a senior group once a month.  Her mother and maternal aunt had dementia.   She has history of nausea and mild anorexia.  No vomiting.  She had been on both Aricept and rivastigmine at one time and were discontinued with concern that it was causing the nausea.  However, the nausea  never resolved.  She has been evaluated by GI with no clear etiology.  She was subsequently restarted on Aricept and then Namenda.   11/05/13 CT HEAD:  no bleed, mass lesions or acute infarcts seen.  Mild atrophy and atherosclerotic changes noted.  PAST MEDICAL HISTORY: Past Medical History:  Diagnosis Date   Anxiety    COPD (chronic obstructive pulmonary disease) ([place])    Dementia ([place])    Diverticulosis of colon (without mention of hemorrhage)    Gallstones    GERD (gastroesophageal reflux disease)    Hiatal hernia    Hyperlipidemia    Hypertension    Hypothyroidism    Low back pain    OA R radiculopathy   Lower extremity edema    chronic   Osteopenia    Peptic ulcer    Peripheral vascular disease ([place])    Personal history of colonic polyps 2001   TUBULAR ADENOMA - [place]   Pneumonia     MEDICATIONS: Current Outpatient Medications on File Prior to Visit  Medication Sig Dispense Refill   acetaminophen (TYLENOL) 325 MG tablet Take 650 mg by mouth every 6 (six) hours as needed.     aspirin 81 MG EC tablet Take 81 mg by mouth daily.     Cholecalciferol (VITAMIN D3) 2000 units capsule Take 1 capsule (2,000 Units total) daily by mouth. 100 capsule 3   COVID-19 mRNA Vac-TriS, Pfizer, SUSP injection Inject into  the muscle. 0.3 mL 0   Diclofenac Sodium 1.5 % SOLN Place 3-5 drops onto the skin 3 (three) times daily as needed. For pain applies on joints     donepezil (ARICEPT) 10 MG tablet TAKE 1 TABLET AT BEDTIME 90 tablet 3   furosemide (LASIX) 20 MG tablet TAKE 2 TABLETS DAILY 180 tablet 3   INCRUSE ELLIPTA 62.5 MCG/INH AEPB USE 1 INHALATION DAILY 90 each 3   losartan (COZAAR) 50 MG tablet TAKE 1 TABLET IN THE MORNING AND AT BEDTIME 180 tablet 0   memantine (NAMENDA) 10 MG tablet Take 1 tablet (10 mg total) by mouth 2 (two) times daily. 180 tablet 3   Multiple Vitamin (MULTIVITAMIN) tablet Take 1 tablet by mouth daily.     omeprazole (PRILOSEC) 40 MG capsule TAKE 1 CAPSULE DAILY 90  capsule 3   ondansetron (ZOFRAN) 4 MG tablet Take 1 tablet (4 mg total) by mouth every 6 (six) hours as needed. Take  1 tab every 6 hours as needed for nausea and vomiting. 20 tablet 1   simvastatin (ZOCOR) 40 MG tablet TAKE 1 TABLET DAILY 90 tablet 0   SYNTHROID 50 MCG tablet Take 1 tablet (50 mcg total) by mouth daily before breakfast. 90 tablet 3   No current facility-administered medications on file prior to visit.    ALLERGIES: Allergies  Allergen Reactions   Codeine Sulfate Other (See Comments)    Extreme headaches   Exelon [Rivastigmine Tartrate]     nausea   Oxycodone-Acetaminophen Other (See Comments)    Extreme headaches    FAMILY HISTORY: Family History  Problem Relation Age of Onset   Dementia Mother    Hypertension Father    Heart disease Father    Colon cancer Sister    Heart disease Sister    Esophageal cancer Neg Hx    Rectal cancer Neg Hx    Stomach cancer Neg Hx       Objective:  Blood pressure 135/82, pulse (!) 101, height 5\' 2"  (1.575 m), weight 141 lb 6 oz (64.1 kg), SpO2 98 %. General: No acute distress.  Patient appears well-groomed.   Head:  Normocephalic/atraumatic Eyes:  Fundi examined but not visualized Neck: supple, no paraspinal tenderness, full range of motion Heart:  Regular rate and rhythm Lungs:  Clear to auscultation bilaterally Back: No paraspinal tenderness Neurological Exam:  MMSE - Mini Mental State Exam 04/28/2021 04/13/2020 04/16/2019 04/17/2018 04/16/2018 04/05/2017 03/15/2017  Not completed: - - - (No Data) - Refused -  Orientation to time 0 1 1 - 0 - 4  Orientation to Place 1 0 5 - 4 - 3  Registration 3 3 3  - 3 - 3  Attention/ Calculation 0 1 0 - 3 - 5  Recall 0 0 0 - 0 - 0  Language- name 2 objects 2 2 1  - 2 - 2  Language- repeat 1 1 0 - 1 - 1  Language- follow 3 step command 2 3 3  - 3 - 3  Language- read & follow direction 1 1 1  - 1 - 1  Write a sentence 1 1 1  - 1 - 1  Copy design 0 1 1 - 1 - 1  Total score 11 14 16  - 19 - 24     CN II-XII intact. Bulk and tone normal, muscle strength 5/5 throughout.  Sensation to light touch intact.  Deep tendon reflexes 2+ throughout, toes downgoing.  Finger to nose testing intact.  Gait normal, Romberg negative.  Metta Clines, DO  CC: Lew Dawes, MD

## 2021-05-30 ENCOUNTER — Other Ambulatory Visit: Payer: Self-pay | Admitting: Internal Medicine

## 2021-06-01 ENCOUNTER — Ambulatory Visit (INDEPENDENT_AMBULATORY_CARE_PROVIDER_SITE_OTHER): Payer: Medicare Other | Admitting: Internal Medicine

## 2021-06-01 ENCOUNTER — Encounter: Payer: Self-pay | Admitting: Internal Medicine

## 2021-06-01 ENCOUNTER — Other Ambulatory Visit: Payer: Self-pay

## 2021-06-01 VITALS — BP 130/72 | HR 83 | Temp 98.3°F | Ht 62.0 in | Wt 139.0 lb

## 2021-06-01 DIAGNOSIS — R413 Other amnesia: Secondary | ICD-10-CM | POA: Diagnosis not present

## 2021-06-01 DIAGNOSIS — G301 Alzheimer's disease with late onset: Secondary | ICD-10-CM

## 2021-06-01 DIAGNOSIS — I10 Essential (primary) hypertension: Secondary | ICD-10-CM | POA: Diagnosis not present

## 2021-06-01 DIAGNOSIS — J449 Chronic obstructive pulmonary disease, unspecified: Secondary | ICD-10-CM

## 2021-06-01 DIAGNOSIS — Z23 Encounter for immunization: Secondary | ICD-10-CM

## 2021-06-01 DIAGNOSIS — R42 Dizziness and giddiness: Secondary | ICD-10-CM

## 2021-06-01 DIAGNOSIS — E034 Atrophy of thyroid (acquired): Secondary | ICD-10-CM | POA: Diagnosis not present

## 2021-06-01 DIAGNOSIS — I700 Atherosclerosis of aorta: Secondary | ICD-10-CM | POA: Diagnosis not present

## 2021-06-01 DIAGNOSIS — F0280 Dementia in other diseases classified elsewhere without behavioral disturbance: Secondary | ICD-10-CM | POA: Diagnosis not present

## 2021-06-01 DIAGNOSIS — E785 Hyperlipidemia, unspecified: Secondary | ICD-10-CM | POA: Diagnosis not present

## 2021-06-01 LAB — LIPID PANEL
Cholesterol: 147 mg/dL (ref 0–200)
HDL: 38.3 mg/dL — ABNORMAL LOW (ref >39.00)
NonHDL: 108.89
Total CHOL/HDL Ratio: 4
Triglycerides: 216 mg/dL — ABNORMAL HIGH (ref 0.0–149.0)
VLDL: 43.2 mg/dL — ABNORMAL HIGH (ref 0.0–40.0)

## 2021-06-01 LAB — TSH: TSH: 1.99 u[IU]/mL (ref 0.35–5.50)

## 2021-06-01 LAB — COMPREHENSIVE METABOLIC PANEL
ALT: 36 U/L — ABNORMAL HIGH (ref 0–35)
AST: 34 U/L (ref 0–37)
Albumin: 4.1 g/dL (ref 3.5–5.2)
Alkaline Phosphatase: 63 U/L (ref 39–117)
BUN: 15 mg/dL (ref 6–23)
CO2: 33 mEq/L — ABNORMAL HIGH (ref 19–32)
Calcium: 9.8 mg/dL (ref 8.4–10.5)
Chloride: 99 mEq/L (ref 96–112)
Creatinine, Ser: 0.93 mg/dL (ref 0.40–1.20)
GFR: 56.84 mL/min — ABNORMAL LOW (ref >60.00)
Glucose, Bld: 133 mg/dL — ABNORMAL HIGH (ref 70–99)
Potassium: 4 mEq/L (ref 3.5–5.1)
Sodium: 140 mEq/L (ref 135–145)
Total Bilirubin: 0.7 mg/dL (ref 0.2–1.2)
Total Protein: 6.7 g/dL (ref 6.0–8.3)

## 2021-06-01 LAB — CBC WITH DIFFERENTIAL/PLATELET
Basophils Absolute: 0 10*3/uL (ref 0.0–0.1)
Basophils Relative: 0.5 % (ref 0.0–3.0)
Eosinophils Absolute: 0.1 10*3/uL (ref 0.0–0.7)
Eosinophils Relative: 0.7 % (ref 0.0–5.0)
HCT: 47.5 % — ABNORMAL HIGH (ref 36.0–46.0)
Hemoglobin: 15.8 g/dL — ABNORMAL HIGH (ref 12.0–15.0)
Lymphocytes Relative: 36.8 % (ref 12.0–46.0)
Lymphs Abs: 4 10*3/uL (ref 0.7–4.0)
MCHC: 33.3 g/dL (ref 30.0–36.0)
MCV: 90 fl (ref 78.0–100.0)
Monocytes Absolute: 0.6 10*3/uL (ref 0.1–1.0)
Monocytes Relative: 5.1 % (ref 3.0–12.0)
Neutro Abs: 6.2 10*3/uL (ref 1.4–7.7)
Neutrophils Relative %: 56.9 % (ref 43.0–77.0)
Platelets: 141 10*3/uL — ABNORMAL LOW (ref 150.0–400.0)
RBC: 5.28 Mil/uL — ABNORMAL HIGH (ref 3.87–5.11)
RDW: 12.9 % (ref 11.5–15.5)
WBC: 10.9 10*3/uL — ABNORMAL HIGH (ref 4.0–10.5)

## 2021-06-01 LAB — LDL CHOLESTEROL, DIRECT: Direct LDL: 93 mg/dL

## 2021-06-01 MED ORDER — FUROSEMIDE 20 MG PO TABS
20.0000 mg | ORAL_TABLET | Freq: Every day | ORAL | 3 refills | Status: DC
Start: 2021-06-01 — End: 2022-01-23

## 2021-06-01 MED ORDER — LOSARTAN POTASSIUM 50 MG PO TABS
25.0000 mg | ORAL_TABLET | Freq: Every day | ORAL | 0 refills | Status: DC
Start: 2021-06-01 — End: 2021-06-21

## 2021-06-01 NOTE — Assessment & Plan Note (Signed)
Cont on Levothroid 

## 2021-06-01 NOTE — Assessment & Plan Note (Signed)
Cont on Losartan, Furosemide Monitor K, creat

## 2021-06-01 NOTE — Assessment & Plan Note (Signed)
Cont on Incruse Ellipta

## 2021-06-01 NOTE — Addendum Note (Signed)
Addended by: Earnstine Regal on: 06/01/2021 11:01 AM   Modules accepted: Orders

## 2021-06-01 NOTE — Assessment & Plan Note (Signed)
Worse For lightheadedness: Try to reduce Furosemide to 1 tablet a day. Watch for swelling. Take Losartan 1/2 tablet a day

## 2021-06-01 NOTE — Patient Instructions (Signed)
For lightheadedness: Try to reduce Furosemide to 1 tablet a day. Watch for swelling. Take Losartan 1/2 tablet a day

## 2021-06-01 NOTE — Assessment & Plan Note (Signed)
Cont on Aricept and Namenda po F/u w/Dr Tomi Likens

## 2021-06-01 NOTE — Assessment & Plan Note (Signed)
No recent changes On On Aricept and Namenda Dr Tomi Likens

## 2021-06-01 NOTE — Addendum Note (Signed)
Addended by: Jacobo Forest on: 06/01/2021 10:36 AM   Modules accepted: Orders

## 2021-06-01 NOTE — Assessment & Plan Note (Signed)
Cont on Simvastatin 

## 2021-06-01 NOTE — Progress Notes (Signed)
Subjective:  Patient ID: [name], female    DOB: 1936-05-09  Age: 85 y.o. MRN: [id number]  CC: Follow-up (4 month f/u)   HPI [name] presents for dementia, COPD,   Outpatient Medications Prior to Visit  Medication Sig Dispense Refill   acetaminophen (TYLENOL) 325 MG tablet Take 650 mg by mouth every 6 (six) hours as needed.     aspirin 81 MG EC tablet Take 81 mg by mouth daily.     Cholecalciferol (VITAMIN D3) 2000 units capsule Take 1 capsule (2,000 Units total) daily by mouth. 100 capsule 3   Diclofenac Sodium 1.5 % SOLN Place 3-5 drops onto the skin 3 (three) times daily as needed. For pain applies on joints     donepezil (ARICEPT) 10 MG tablet TAKE 1 TABLET AT BEDTIME 90 tablet 3   furosemide (LASIX) 20 MG tablet TAKE 2 TABLETS DAILY 180 tablet 3   INCRUSE ELLIPTA 62.5 MCG/INH AEPB USE 1 INHALATION DAILY 90 each 3   losartan (COZAAR) 50 MG tablet TAKE 1 TABLET IN THE MORNING AND AT BEDTIME 180 tablet 0   memantine (NAMENDA) 10 MG tablet Take 1 tablet (10 mg total) by mouth 2 (two) times daily. 180 tablet 3   Multiple Vitamin (MULTIVITAMIN) tablet Take 1 tablet by mouth daily.     omeprazole (PRILOSEC) 40 MG capsule TAKE 1 CAPSULE DAILY 90 capsule 3   ondansetron (ZOFRAN) 4 MG tablet Take 1 tablet (4 mg total) by mouth every 6 (six) hours as needed. Take  1 tab every 6 hours as needed for nausea and vomiting. 20 tablet 1   simvastatin (ZOCOR) 40 MG tablet TAKE 1 TABLET DAILY 90 tablet 0   SYNTHROID 50 MCG tablet Take 1 tablet (50 mcg total) by mouth daily before breakfast. 90 tablet 3   No facility-administered medications prior to visit.    ROS: Review of Systems  Constitutional:  Positive for fatigue. Negative for activity change, appetite change, chills and unexpected weight change.  HENT:  Negative for congestion, mouth sores and sinus pressure.   Eyes:  Negative for visual disturbance.  Respiratory:  Positive for cough and shortness of breath.  Negative for chest tightness.   Gastrointestinal:  Negative for abdominal pain and nausea.  Genitourinary:  Negative for difficulty urinating, frequency and vaginal pain.  Musculoskeletal:  Negative for back pain and gait problem.  Skin:  Negative for pallor and rash.  Neurological:  Negative for dizziness, tremors, weakness, numbness and headaches.  Psychiatric/Behavioral:  Positive for confusion. Negative for sleep disturbance and suicidal ideas. The patient is not nervous/anxious.    Objective:  BP 130/72 (BP Location: Left Arm)   Pulse 83   Temp 98.3 F (36.8 C) (Oral)   Ht '5\' 2"'$  (1.575 m)   Wt 139 lb (63 kg)   SpO2 94%   BMI 25.42 kg/m   BP Readings from Last 3 Encounters:  06/01/21 130/72  04/28/21 135/82  02/01/21 132/60    Wt Readings from Last 3 Encounters:  06/01/21 139 lb (63 kg)  04/28/21 141 lb 6 oz (64.1 kg)  02/01/21 137 lb (62.1 kg)    Physical Exam Constitutional:      General: She is not in acute distress.    Appearance: She is well-developed.  HENT:     Head: Normocephalic.     Right Ear: External ear normal.     Left Ear: External ear normal.     Nose: Nose normal.  Eyes:  General:        Right eye: No discharge.        Left eye: No discharge.     Conjunctiva/sclera: Conjunctivae normal.     Pupils: Pupils are equal, round, and reactive to light.  Neck:     Thyroid: No thyromegaly.     Vascular: No JVD.     Trachea: No tracheal deviation.  Cardiovascular:     Rate and Rhythm: Normal rate and regular rhythm.     Heart sounds: Normal heart sounds.  Pulmonary:     Effort: No respiratory distress.     Breath sounds: No stridor. No wheezing.  Abdominal:     General: Bowel sounds are normal. There is no distension.     Palpations: Abdomen is soft. There is no mass.     Tenderness: There is no abdominal tenderness. There is no guarding or rebound.  Musculoskeletal:        General: No swelling or tenderness.     Cervical back: Normal range  of motion and neck supple. No rigidity.  Lymphadenopathy:     Cervical: No cervical adenopathy.  Skin:    Findings: No erythema or rash.  Neurological:     Cranial Nerves: No cranial nerve deficit.     Motor: No abnormal muscle tone.     Coordination: Coordination normal.     Gait: Gait abnormal.     Deep Tendon Reflexes: Reflexes normal.  Psychiatric:        Mood and Affect: Mood normal.        Behavior: Behavior normal.    Lab Results  Component Value Date   WBC 9.6 02/01/2021   HGB 15.6 (H) 02/01/2021   HCT 46.4 (H) 02/01/2021   PLT 138.0 (L) 02/01/2021   GLUCOSE 134 (H) 02/01/2021   CHOL 155 11/15/2017   TRIG 206.0 (H) 11/15/2017   HDL 40.30 11/15/2017   LDLDIRECT 90.0 11/15/2017   LDLCALC 70 12/16/2013   ALT 28 02/01/2021   AST 28 02/01/2021   NA 143 02/01/2021   K 3.6 02/01/2021   CL 101 02/01/2021   CREATININE 0.96 02/01/2021   BUN 19 02/01/2021   CO2 31 02/01/2021   TSH 2.54 02/01/2021   INR 1.15 08/10/2011   HGBA1C 5.8 10/12/2011    VAS [place] LOWER EXTREMITY VENOUS (DVT)  Result Date: 07/09/2020  Lower Venous DVTStudy Indications: Bilateral feet edema left > right. Patient denies SOB or chest pains.  Comparison Study: None Performing Technologist: Alecia Mackin RVT, RDCS (AE), RDMS  Examination Guidelines: A complete evaluation includes B-mode imaging, spectral Doppler, color Doppler, and power Doppler as needed of all accessible portions of each vessel. Bilateral testing is considered an integral part of a complete examination. Limited examinations for reoccurring indications may be performed as noted. The reflux portion of the exam is performed with the patient in reverse Trendelenburg.  +---------+---------------+---------+-----------+----------+--------------+ RIGHT    CompressibilityPhasicitySpontaneityPropertiesThrombus Aging +---------+---------------+---------+-----------+----------+--------------+ CFV      Full           Yes      Yes                                  +---------+---------------+---------+-----------+----------+--------------+ SFJ      Full           Yes      Yes                                 +---------+---------------+---------+-----------+----------+--------------+  FV Prox  Full           Yes      Yes                                 +---------+---------------+---------+-----------+----------+--------------+ FV Mid   Full           Yes      Yes                                 +---------+---------------+---------+-----------+----------+--------------+ FV DistalFull           Yes      Yes                                 +---------+---------------+---------+-----------+----------+--------------+ PFV      Full                                                        +---------+---------------+---------+-----------+----------+--------------+ POP      Full           Yes      Yes                                 +---------+---------------+---------+-----------+----------+--------------+ PTV      Full           Yes      Yes                                 +---------+---------------+---------+-----------+----------+--------------+ PERO     Full           Yes      Yes                                 +---------+---------------+---------+-----------+----------+--------------+ Gastroc  Full                                                        +---------+---------------+---------+-----------+----------+--------------+ GSV      Full           Yes      Yes                                 +---------+---------------+---------+-----------+----------+--------------+   +---------+---------------+---------+-----------+----------+--------------+ LEFT     CompressibilityPhasicitySpontaneityPropertiesThrombus Aging +---------+---------------+---------+-----------+----------+--------------+ CFV      Full           Yes      Yes                                  +---------+---------------+---------+-----------+----------+--------------+ SFJ      Full           Yes  Yes                                 +---------+---------------+---------+-----------+----------+--------------+ FV Prox  Full           Yes      Yes                                 +---------+---------------+---------+-----------+----------+--------------+ FV Mid   Full           Yes      Yes                                 +---------+---------------+---------+-----------+----------+--------------+ FV DistalFull           Yes      Yes                                 +---------+---------------+---------+-----------+----------+--------------+ PFV      Full                                                        +---------+---------------+---------+-----------+----------+--------------+ POP      Full           Yes      Yes                                 +---------+---------------+---------+-----------+----------+--------------+ PTV      Full           Yes      Yes                                 +---------+---------------+---------+-----------+----------+--------------+ PERO     Full           Yes      Yes                                 +---------+---------------+---------+-----------+----------+--------------+ Gastroc  Full                                                        +---------+---------------+---------+-----------+----------+--------------+ GSV      Full           Yes      Yes                                 +---------+---------------+---------+-----------+----------+--------------+    Findings reported to Marvis Repress Murray's email though [employer] at 3:15 pm.  Summary: BILATERAL: - No evidence of deep vein thrombosis seen in the lower extremities, bilaterally. - RIGHT: - No cystic structure found in the popliteal fossa.  LEFT: - No cystic structure found in the popliteal fossa.  *See table(s)  above for measurements and  observations. Electronically signed by Larae Grooms MD on 07/09/2020 at 6:10:48 PM.    Final     Assessment & Plan:   There are no diagnoses linked to this encounter.   No orders of the defined types were placed in this encounter.    Follow-up: No follow-ups on file.  [name], MD

## 2021-06-01 NOTE — Assessment & Plan Note (Signed)
Cont on Simvastatin, ASA

## 2021-06-06 ENCOUNTER — Ambulatory Visit: Payer: Medicare Other | Admitting: Internal Medicine

## 2021-06-21 ENCOUNTER — Other Ambulatory Visit: Payer: Self-pay | Admitting: Internal Medicine

## 2021-07-06 ENCOUNTER — Telehealth: Payer: Self-pay

## 2021-07-06 ENCOUNTER — Ambulatory Visit (INDEPENDENT_AMBULATORY_CARE_PROVIDER_SITE_OTHER): Payer: Medicare Other

## 2021-07-06 ENCOUNTER — Other Ambulatory Visit: Payer: Self-pay

## 2021-07-06 DIAGNOSIS — Z23 Encounter for immunization: Secondary | ICD-10-CM

## 2021-07-06 NOTE — Telephone Encounter (Signed)
Prolia VOB initiated via [email or web address]  Last OV:  Next OV:  Last Prolia inj: 11/02/20 Next Prolia inj DUE:  05/03/21

## 2021-07-06 NOTE — Telephone Encounter (Signed)
Pt ready for scheduling on or after 05/03/21  Out-of-pocket cost due at time of visit: $0.00  Primary: Medicare Prolia co-insurance: 20% (approximately $255) Admin fee co-insurance: 20% (approximately $25)  Secondary: [employer] Prolia co-insurance: covers the Medicare Part B coinsurance and deductible. Admin fee co-insurance: covers the Medicare Part B coinsurance and deductible.  Deductible: $233 of $233 met  Prior Auth: not required PA# Valid:   ** This summary of benefits is an estimation of the patient's out-of-pocket cost. Exact cost may vary based on individual plan coverage.

## 2021-08-17 ENCOUNTER — Ambulatory Visit (INDEPENDENT_AMBULATORY_CARE_PROVIDER_SITE_OTHER): Payer: Medicare Other

## 2021-08-17 ENCOUNTER — Other Ambulatory Visit: Payer: Self-pay

## 2021-08-17 DIAGNOSIS — M810 Age-related osteoporosis without current pathological fracture: Secondary | ICD-10-CM

## 2021-08-17 MED ORDER — DENOSUMAB 60 MG/ML ~~LOC~~ SOSY
60.0000 mg | PREFILLED_SYRINGE | Freq: Once | SUBCUTANEOUS | Status: AC
  Administered 2021-08-17: 11:00:00 60 mg via SUBCUTANEOUS

## 2021-08-17 NOTE — Progress Notes (Addendum)
Prolia given sub L arm and tolerated well  Medical screening examination/treatment/procedure(s) were performed by non-physician practitioner and as supervising physician I was immediately available for consultation/collaboration.  I agree with above. Lew Dawes, MD

## 2021-09-10 NOTE — Telephone Encounter (Signed)
Last Prolia inj 08/17/21 Next Prolia inj due 02/15/22

## 2021-09-17 ENCOUNTER — Other Ambulatory Visit: Payer: Self-pay | Admitting: Internal Medicine

## 2021-10-06 ENCOUNTER — Ambulatory Visit: Payer: TRICARE For Life (TFL) | Admitting: Internal Medicine

## 2021-10-24 ENCOUNTER — Ambulatory Visit: Payer: Medicare Other | Admitting: Neurology

## 2022-01-09 NOTE — Telephone Encounter (Addendum)
Prolia VOB initiated via [email or web address] ? ?Last OV: 06/01/21 ?Next OV:  ?Last Prolia inj: 08/17/21 ?Next Prolia inj DUE: 02/15/22 ? ?

## 2022-01-23 ENCOUNTER — Other Ambulatory Visit: Payer: Self-pay | Admitting: Internal Medicine

## 2022-01-26 NOTE — Telephone Encounter (Signed)
Pt ready for scheduling on or after 02/15/22 ? ?Out-of-pocket cost due at time of visit: $0 ? ?Primary: Medicare ?Prolia co-insurance: 20% (approximately $276) ?Admin fee co-insurance: 20% (approximately $25) ? ?Secondary: [employer] Medicare Supp ?Prolia co-insurance: will consider Medicare co-insurance ?Admin fee co-insurance: will consider Medicare co-insurance ? ?Deductible: will consider Medicare deductible ? ?Prior Auth: not required ?PA# ?Valid:  ? ?** This summary of benefits is an estimation of the patient's out-of-pocket cost. Exact cost may vary based on individual plan coverage.  ? ?

## 2022-02-24 ENCOUNTER — Other Ambulatory Visit: Payer: Self-pay | Admitting: Internal Medicine

## 2022-03-17 ENCOUNTER — Encounter: Payer: Self-pay | Admitting: Internal Medicine

## 2022-03-27 ENCOUNTER — Ambulatory Visit: Payer: Medicare Other

## 2022-04-03 ENCOUNTER — Ambulatory Visit (INDEPENDENT_AMBULATORY_CARE_PROVIDER_SITE_OTHER): Payer: Medicare Other

## 2022-04-03 DIAGNOSIS — M810 Age-related osteoporosis without current pathological fracture: Secondary | ICD-10-CM | POA: Diagnosis not present

## 2022-04-03 MED ORDER — DENOSUMAB 60 MG/ML ~~LOC~~ SOSY
60.0000 mg | PREFILLED_SYRINGE | Freq: Once | SUBCUTANEOUS | Status: AC
  Administered 2022-04-03: 60 mg via SUBCUTANEOUS

## 2022-04-03 NOTE — Progress Notes (Addendum)
After obtaining consent, and per orders of Dr. Alain Marion, injection of Prolia given by Max Sane. Patient tolerated injection well in left arm and instructed to report any adverse reaction to me immediately.   Medical screening examination/treatment/procedure(s) were performed by non-physician practitioner and as supervising physician I was immediately available for consultation/collaboration.  I agree with above. Lew Dawes, MD

## 2022-04-13 NOTE — Telephone Encounter (Signed)
Last Prolia inj 04/03/22 Next Prolia inj due 10/04/22

## 2022-04-17 ENCOUNTER — Other Ambulatory Visit: Payer: Self-pay | Admitting: Internal Medicine

## 2022-04-27 NOTE — Progress Notes (Signed)
NEUROLOGY FOLLOW UP OFFICE NOTE  [name] [id number]  Assessment/Plan:   1  Major neurocognitive disorder secondary to Alzheimer's disease 2  Hypoxia - initial O2 sat was 86%, then 90%.  Seems out of breath.  Possible COPD exacerbation. Without inhaler   Recommend to patient and her husband to go to ED regarding hypoxia Donepezil '10mg'$  QHS and memantine '10mg'$  BID Follow up one year   Subjective:  [name] is an 86 year old right-handed female with GERD, peripheral vascular disease, hyperlipidemia, COPD, hypertension, hypothyroidism, and anxiety who follows up for Alzheimer's disease.  She is accompanied by her husband who supplements history.   UPDATE: She is taking Aricept '10mg'$  daily and Namenda '10mg'$  twice daily.   Memory loss progressed.  She has trouble with some names.  She is able to dress and bathe herself but requires assistance using the toilet.  She doesn't keep the house tidy as she used to.  She does not cook or drive.  Naps often but still able to sleep at night.. She does not get agitated or combative.  She is not hallucinating or delusional.  She denies depression.    HISTORY: Her husband began to notice changes in memory around 2012 or 2013.  She would have problems with short-term memory.  She would forget about phone calls.  She would repeat questions. Her husband usually goes to the grocery store.  She has no difficulty remembering names or faces.  She has no hallucinations or delusions.  She sleeps well.  She is not depressed.  There has been no change in personality or behavior.  She is able to perform all her ADLs.  She has gotten lost while driving.  During the day, she tends to the house but spends time watching TV.  She reads the Bible but not everyday.  She and her husband are active in the church.  They go to choir practice every Wednesday night.  They participate in a senior group once a month.  Her mother and maternal aunt had  dementia.   She has history of nausea and mild anorexia.  No vomiting.  She had been on both Aricept and rivastigmine at one time and were discontinued with concern that it was causing the nausea.  However, the nausea never resolved.  She has been evaluated by GI with no clear etiology.  She was subsequently restarted on Aricept and then Namenda.   11/05/13 CT HEAD:  no bleed, mass lesions or acute infarcts seen.  Mild atrophy and atherosclerotic changes noted.    PAST MEDICAL HISTORY: Past Medical History:  Diagnosis Date   Anxiety    COPD (chronic obstructive pulmonary disease) ([place])    Dementia ([place])    Diverticulosis of colon (without mention of hemorrhage)    Gallstones    GERD (gastroesophageal reflux disease)    Hiatal hernia    Hyperlipidemia    Hypertension    Hypothyroidism    Low back pain    OA R radiculopathy   Lower extremity edema    chronic   Osteopenia    Peptic ulcer    Peripheral vascular disease ([place])    Personal history of colonic polyps 2001   TUBULAR ADENOMA - [place]   Pneumonia     MEDICATIONS: Current Outpatient Medications on File Prior to Visit  Medication Sig Dispense Refill   donepezil (ARICEPT) 10 MG tablet Take 1 tablet (10 mg total) by mouth at bedtime. Annual appt due in Oct must  see provider for future refills 90 tablet 1   furosemide (LASIX) 20 MG tablet TAKE 2 TABLETS DAILY 180 tablet 1   acetaminophen (TYLENOL) 325 MG tablet Take 650 mg by mouth every 6 (six) hours as needed.     aspirin 81 MG EC tablet Take 81 mg by mouth daily.     Cholecalciferol (VITAMIN D3) 2000 units capsule Take 1 capsule (2,000 Units total) daily by mouth. 100 capsule 3   Diclofenac Sodium 1.5 % SOLN Place 3-5 drops onto the skin 3 (three) times daily as needed. For pain applies on joints     INCRUSE ELLIPTA 62.5 MCG/INH AEPB USE 1 INHALATION DAILY 90 each 3   losartan (COZAAR) 50 MG tablet TAKE 1 TABLET IN THE MORNING AND AT BEDTIME (FOLLOW UP APPOINTMENT IS DUE IN  AUGUST WITH LABS, MUST SEE PROVIDER FOR FUTURE REFILLS) 180 tablet 3   memantine (NAMENDA) 10 MG tablet Take 1 tablet (10 mg total) by mouth 2 (two) times daily. Follow-up appt due in August must see provider for future refills 180 tablet 0   Multiple Vitamin (MULTIVITAMIN) tablet Take 1 tablet by mouth daily.     omeprazole (PRILOSEC) 40 MG capsule TAKE 1 CAPSULE DAILY 90 capsule 3   ondansetron (ZOFRAN) 4 MG tablet Take 1 tablet (4 mg total) by mouth every 6 (six) hours as needed. Take  1 tab every 6 hours as needed for nausea and vomiting. 20 tablet 1   simvastatin (ZOCOR) 40 MG tablet TAKE 1 TABLET DAILY (FOLLOW UP APPOINTMENT DUE IN AUGUST WITH LABS, MUST SEE PROVIDER FOR FUTURE REFILLS) 90 tablet 3   SYNTHROID 50 MCG tablet Take 1 tablet (50 mcg total) by mouth daily before breakfast. Annual appt due in Aug must see provider for future refills 90 tablet 0   No current facility-administered medications on file prior to visit.    ALLERGIES: Allergies  Allergen Reactions   Codeine Sulfate Other (See Comments)    Extreme headaches   Exelon [Rivastigmine Tartrate]     nausea   Oxycodone-Acetaminophen Other (See Comments)    Extreme headaches    FAMILY HISTORY: Family History  Problem Relation Age of Onset   Dementia Mother    Hypertension Father    Heart disease Father    Colon cancer Sister    Heart disease Sister    Esophageal cancer Neg Hx    Rectal cancer Neg Hx    Stomach cancer Neg Hx       Objective:  Blood pressure 110/60, pulse 88, height '5\' 2"'$  (1.575 m), weight 130 lb (59 kg), SpO2 90 %. General: No acute distress.  Patient appears well-groomed.   Head:  Normocephalic/atraumatic Eyes:  Fundi examined but not visualized Neck: supple, no paraspinal tenderness, full range of motion Heart:  Regular rate and rhythm Neurological Exam:   CN II-XII intact. Bulk and tone normal, muscle strength 5/5 throughout.  Sensation to light touch intact.  Deep tendon reflexes 2+  throughout, toes downgoing.  Finger to nose testing intact.  Gait normal, Romberg negative.   Metta Clines, DO  CC: Lew Dawes, MD

## 2022-05-01 ENCOUNTER — Encounter (HOSPITAL_COMMUNITY): Payer: Self-pay | Admitting: Emergency Medicine

## 2022-05-01 ENCOUNTER — Other Ambulatory Visit: Payer: Self-pay

## 2022-05-01 ENCOUNTER — Emergency Department (HOSPITAL_COMMUNITY)
Admission: EM | Admit: 2022-05-01 | Discharge: 2022-05-01 | Disposition: A | Discharge status: Home / Self Care | Payer: Medicare Other | Attending: Emergency Medicine | Admitting: Emergency Medicine

## 2022-05-01 ENCOUNTER — Ambulatory Visit (INDEPENDENT_AMBULATORY_CARE_PROVIDER_SITE_OTHER): Payer: Medicare Other | Admitting: Neurology

## 2022-05-01 ENCOUNTER — Encounter: Payer: Self-pay | Admitting: Neurology

## 2022-05-01 ENCOUNTER — Emergency Department (HOSPITAL_COMMUNITY): Payer: Medicare Other

## 2022-05-01 VITALS — BP 110/60 | HR 88 | Ht 62.0 in | Wt 130.0 lb

## 2022-05-01 DIAGNOSIS — F0280 Dementia in other diseases classified elsewhere without behavioral disturbance: Secondary | ICD-10-CM

## 2022-05-01 DIAGNOSIS — Z5321 Procedure and treatment not carried out due to patient leaving prior to being seen by health care provider: Secondary | ICD-10-CM | POA: Diagnosis not present

## 2022-05-01 DIAGNOSIS — R0902 Hypoxemia: Secondary | ICD-10-CM

## 2022-05-01 DIAGNOSIS — G309 Alzheimer's disease, unspecified: Secondary | ICD-10-CM

## 2022-05-01 DIAGNOSIS — R0602 Shortness of breath: Secondary | ICD-10-CM | POA: Insufficient documentation

## 2022-05-01 LAB — BRAIN NATRIURETIC PEPTIDE: B Natriuretic Peptide: 24.5 pg/mL (ref 0.0–100.0)

## 2022-05-01 LAB — BASIC METABOLIC PANEL
Anion gap: 10 (ref 5–15)
BUN: 18 mg/dL (ref 8–23)
CO2: 28 mmol/L (ref 22–32)
Calcium: 9.4 mg/dL (ref 8.9–10.3)
Chloride: 103 mmol/L (ref 98–111)
Creatinine, Ser: 1.09 mg/dL — ABNORMAL HIGH (ref 0.44–1.00)
GFR, Estimated: 50 mL/min — ABNORMAL LOW (ref >60)
Glucose, Bld: 150 mg/dL — ABNORMAL HIGH (ref 70–99)
Potassium: 3.9 mmol/L (ref 3.5–5.1)
Sodium: 141 mmol/L (ref 135–145)

## 2022-05-01 LAB — CBC WITH DIFFERENTIAL/PLATELET
Abs Immature Granulocytes: 0.03 10*3/uL (ref 0.00–0.07)
Basophils Absolute: 0.1 10*3/uL (ref 0.0–0.1)
Basophils Relative: 1 %
Eosinophils Absolute: 0.2 10*3/uL (ref 0.0–0.5)
Eosinophils Relative: 2 %
HCT: 48.5 % — ABNORMAL HIGH (ref 36.0–46.0)
Hemoglobin: 16.2 g/dL — ABNORMAL HIGH (ref 12.0–15.0)
Immature Granulocytes: 0 %
Lymphocytes Relative: 34 %
Lymphs Abs: 3.5 10*3/uL (ref 0.7–4.0)
MCH: 29.7 pg (ref 26.0–34.0)
MCHC: 33.4 g/dL (ref 30.0–36.0)
MCV: 89 fL (ref 80.0–100.0)
Monocytes Absolute: 0.7 10*3/uL (ref 0.1–1.0)
Monocytes Relative: 7 %
Neutro Abs: 5.7 10*3/uL (ref 1.7–7.7)
Neutrophils Relative %: 56 %
Platelets: 184 10*3/uL (ref 150–400)
RBC: 5.45 MIL/uL — ABNORMAL HIGH (ref 3.87–5.11)
RDW: 12.2 % (ref 11.5–15.5)
WBC: 10.3 10*3/uL (ref 4.0–10.5)
nRBC: 0 % (ref 0.0–0.2)

## 2022-05-01 MED ORDER — MEMANTINE HCL 10 MG PO TABS
10.0000 mg | ORAL_TABLET | Freq: Two times a day (BID) | ORAL | 3 refills | Status: DC
Start: 2022-05-01 — End: 2023-12-27

## 2022-05-01 MED ORDER — DONEPEZIL HCL 10 MG PO TABS
10.0000 mg | ORAL_TABLET | Freq: Every day | ORAL | 3 refills | Status: DC
Start: 2022-05-01 — End: 2023-12-27

## 2022-05-01 NOTE — ED Notes (Signed)
Pt husband walked up to this nurse tech and advised me to let a doctor know he will be leaving in 25 min. Advised pt husband I don't have a specific doctor to tell. Pt stated he is giving [place] until 4:30 because the will be home by 5pm.

## 2022-05-01 NOTE — ED Notes (Signed)
Pt left. NT was able to get MSE signed before pt left.

## 2022-05-01 NOTE — Patient Instructions (Signed)
Refilled donepezil and memantine to [facility]  Follow up with me in one year.

## 2022-05-01 NOTE — ED Triage Notes (Signed)
Patient comes from neurologists office and referred here for low O2 saturations w/ exertion and at rest. Patient reports [facility] for about a month now. Pt with smoking hx. Denies chest pain.

## 2022-05-01 NOTE — ED Provider Triage Note (Signed)
Emergency Medicine Provider Triage Evaluation Note  [name] , a 86 y.o. female  was evaluated in triage.  Sent in from her neurologist office for low oxygen saturations.  Apparently was 86 to 90% on room air.  Patient and her husband are both daily smokers.  She is on Ellipta and a nebulizer at home.  Her husband states that he gives her medications only as they are directed.  Over the past month she has had increasing shortness of breath both at rest and with ambulation.  No coughing, no wheezing, no fevers, no peripheral edema..  Review of Systems  Positive: Shortness of breath Negative: Fever  Physical Exam  BP 104/76   Pulse 91   Temp 97.7 F (36.5 C) (Oral)   Resp 18   Ht '5\' 2"'$  (1.575 m)   Wt 59 kg   SpO2 93%   BMI 23.79 kg/m  Gen:   Awake, no distress   Resp:  Normal effort  MSK:   Moves extremities without difficulty  Other:  Excellent air movement, minimal wheezing with deep expiration  Medical Decision Making  Medically screening exam initiated at 3:06 PM.  Appropriate orders placed.  [name] was informed that the remainder of the evaluation will be completed by another provider, this initial triage assessment does not replace that evaluation, and the importance of remaining in the ED until their evaluation is complete.  Work-up initiated   Margarita Mail, PA-C 05/01/22 1031

## 2022-05-25 ENCOUNTER — Other Ambulatory Visit: Payer: Self-pay | Admitting: Internal Medicine

## 2022-06-06 ENCOUNTER — Telehealth: Payer: Self-pay | Admitting: Internal Medicine

## 2022-06-06 ENCOUNTER — Ambulatory Visit (INDEPENDENT_AMBULATORY_CARE_PROVIDER_SITE_OTHER): Payer: Medicare Other

## 2022-06-06 DIAGNOSIS — Z0000 Encounter for general adult medical examination without abnormal findings: Secondary | ICD-10-CM

## 2022-06-06 NOTE — Telephone Encounter (Signed)
Patient needs her Incruse Elipta sent in to [facility] - when writing rx please put [email or web address] - patient will be in Thursday, for appointment.

## 2022-06-06 NOTE — Progress Notes (Cosign Needed Addendum)
Subjective:   [name] is a 86 y.o. female who presents for Medicare Annual (Subsequent) preventive examination.  Review of Systems    Virtual Visit via Telephone Note  I connected with  [name] on 06/06/22 at  3:00 PM EDT by telephone and verified that I am speaking with the correct person using two identifiers.  Location: Patient: Home Provider: [place] Persons participating in the virtual visit: [place]   I discussed the limitations, risks, security and privacy concerns of performing an evaluation and management service by telephone and the availability of in person appointments. The patient expressed understanding and agreed to proceed.  Interactive audio and video telecommunications were attempted between this nurse and patient, however failed, due to patient having technical difficulties OR patient did not have access to video capability.  We continued and completed visit with audio only.  Some vital signs may be absent or patient reported.   Sheral Flow, LPN  Cardiac Risk Factors include: advanced age (>49mn, >>77women);dyslipidemia;hypertension;family history of premature cardiovascular disease;sedentary lifestyle     Objective:    There were no vitals filed for this visit. There is no height or weight on file to calculate BMI.     06/06/2022    4:15 PM 05/01/2022    1:30 PM 04/28/2021    2:19 PM 04/13/2020   10:48 AM 04/16/2019    4:15 PM 04/17/2018   10:57 AM 04/05/2017    1:45 PM  Advanced Directives  Does Patient Have a Medical Advance Directive? Yes Yes Yes No No Yes Yes  Type of AParamed[place]of AFour Bears [place] will Living will    [place]  Copy of [place] Chart? No - copy requested     No - copy requested No - copy requested  Would patient like information on creating a medical  advance directive?     No - Patient declined      Current Medications (verified) Outpatient Encounter Medications as of 06/06/2022  Medication Sig   acetaminophen (TYLENOL) 325 MG tablet Take 650 mg by mouth every 6 (six) hours as needed.   aspirin 81 MG EC tablet Take 81 mg by mouth daily.   Cholecalciferol (VITAMIN D3) 2000 units capsule Take 1 capsule (2,000 Units total) daily by mouth.   Diclofenac Sodium 1.5 % SOLN Place 3-5 drops onto the skin 3 (three) times daily as needed. For pain applies on joints   donepezil (ARICEPT) 10 MG tablet Take 1 tablet (10 mg total) by mouth at bedtime. Annual appt due in Oct must see provider for future refills   furosemide (LASIX) 20 MG tablet TAKE 2 TABLETS DAILY   losartan (COZAAR) 50 MG tablet TAKE 1 TABLET IN THE MORNING AND AT BEDTIME (FOLLOW UP APPOINTMENT IS DUE IN AUGUST WITH LABS, MUST SEE PROVIDER FOR FUTURE REFILLS)   memantine (NAMENDA) 10 MG tablet Take 1 tablet (10 mg total) by mouth 2 (two) times daily. Follow-up appt due in August must see provider for future refills   Multiple Vitamin (MULTIVITAMIN) tablet Take 1 tablet by mouth daily.   omeprazole (PRILOSEC) 40 MG capsule TAKE 1 CAPSULE DAILY   ondansetron (ZOFRAN) 4 MG tablet Take 1 tablet (4 mg total) by mouth every 6 (six) hours as needed. Take  1 tab every 6 hours as needed for nausea and vomiting.   simvastatin (ZOCOR) 40 MG tablet TAKE 1 TABLET DAILY (  FOLLOW UP APPOINTMENT DUE IN AUGUST WITH LABS, MUST SEE PROVIDER FOR FUTURE REFILLS)   SYNTHROID 50 MCG tablet Take 1 tablet (50 mcg total) by mouth daily before breakfast. Annual appt due in Aug must see provider for future refills   umeclidinium bromide (INCRUSE ELLIPTA) 62.5 MCG/ACT AEPB Inhale 1 puff into the lungs daily. Overdue for Annual appt w/labs must see provider for future refills   No facility-administered encounter medications on file as of 06/06/2022.    Allergies (verified) Codeine sulfate, Exelon [rivastigmine  tartrate], and Oxycodone-acetaminophen   History: Past Medical History:  Diagnosis Date   Anxiety    COPD (chronic obstructive pulmonary disease) (HCC)    Dementia (HCC)    Diverticulosis of colon (without mention of hemorrhage)    Gallstones    GERD (gastroesophageal reflux disease)    Hiatal hernia    Hyperlipidemia    Hypertension    Hypothyroidism    Low back pain    OA R radiculopathy   Lower extremity edema    chronic   Osteopenia    Peptic ulcer    Peripheral vascular disease ([place])    Personal history of colonic polyps 2001   TUBULAR ADENOMA - [place]   Pneumonia    Past Surgical History:  Procedure Laterality Date   ABDOMINAL ADHESION SURGERY     ABDOMINAL HYSTERECTOMY     APPENDECTOMY     CARPAL TUNNEL RELEASE Bilateral    CATARACT EXTRACTION W/ INTRAOCULAR LENS  IMPLANT, BILATERAL     CHOLECYSTECTOMY     NECK SURGERY     bone and plate    ROTATOR CUFF REPAIR Bilateral    TUBAL LIGATION     Family History  Problem Relation Age of Onset   Dementia Mother    Hypertension Father    Heart disease Father    Colon cancer Sister    Heart disease Sister    Esophageal cancer Neg Hx    Rectal cancer Neg Hx    Stomach cancer Neg Hx    Social History   Socioeconomic History   Marital status: Married    Spouse name: Not on file   Number of children: 4   Years of education: Not on file   Highest education level: Not on file  Occupational History   Occupation: retired  Tobacco Use   Smoking status: Every Day    Packs/day: 1.50    Years: 61.00    Total pack years: 91.50    Types: Cigarettes   Smokeless tobacco: Never   Tobacco comments:    Sheet given on smoking  Vaping Use   Vaping Use: Never used  Substance and Sexual Activity   Alcohol use: No    Alcohol/week: 0.0 standard drinks of alcohol   Drug use: No   Sexual activity: Never    Partners: Male  Other Topics Concern   Not on file  Social History Narrative   Not on file   Social  Determinants of Health   Financial Resource Strain: Low Risk  (06/06/2022)   Overall Financial Resource Strain (CARDIA)    Difficulty of Paying Living Expenses: Not hard at all  Food Insecurity: No Food Insecurity (06/06/2022)   Hunger Vital Sign    Worried About Running Out of Food in the Last Year: Never true    Ran Out of Food in the Last Year: Never true  Transportation Needs: No Transportation Needs (06/06/2022)   PRAPARE - Hydrologist (Medical): No  Lack of Transportation (Non-Medical): No  Physical Activity: Inactive (06/06/2022)   Exercise Vital Sign    Days of Exercise per Week: 0 days    Minutes of Exercise per Session: 0 min  Stress: No Stress Concern Present (06/06/2022)   [place]    Feeling of Stress : Not at all  Social Connections: [place] (06/06/2022)   Social Connection and Isolation Panel [NHANES]    Frequency of Communication with Friends and Family: More than three times a week    Frequency of Social Gatherings with Friends and Family: More than three times a week    Attends Religious Services: More than 4 times per year    Active Member of [employer] or Organizations: Yes    Attends Music therapist: More than 4 times per year    Marital Status: Married    Tobacco Counseling Ready to quit: Not Answered Counseling given: Not Answered Tobacco comments: Sheet given on smoking   Clinical Intake:  Pre-visit preparation completed: Yes  Pain : No/denies pain     BMI - recorded: 23.78 (05/01/2022) Nutritional Status: BMI of 19-24  Normal Nutritional Risks: None Diabetes: No  How often do you need to have someone help you when you read instructions, pamphlets, or other written materials from your doctor or pharmacy?: 1 - Never What is the last grade level you completed in school?: HSG  Diabetic? no  Interpreter Needed?: No  Information  entered by :: Lisette Abu, LPN.   Activities of Daily Living    06/06/2022    4:17 PM 08/17/2021   10:45 AM  In your present state of health, do you have any difficulty performing the following activities:  Hearing? 0 0  Vision? 0 0  Difficulty concentrating or making decisions? 1 0  Walking or climbing stairs? 0 1  Dressing or bathing? 1 1  Doing errands, shopping? 1 0  Preparing Food and eating ? Y   Using the Toilet? Y   In the past six months, have you accidently leaked urine? Y   Do you have problems with loss of bowel control? Y   Managing your Medications? Y   Managing your Finances? Y   Housekeeping or managing your Housekeeping? Y     Patient Care Team: Cassandria Anger, MD as PCP - Almond Lint, DO as Consulting Physician (Neurology) Milus Banister, MD as Attending Physician (Gastroenterology) Concha Pyo, OD as Consulting Physician (Ophthalmology)  Indicate any recent Medical Services you may have received from other than [facility] providers in the past year (date may be approximate).     Assessment:   This is a routine wellness examination for [name].  Hearing/Vision screen Hearing Screening - Comments:: Denies hearing difficulties   Vision Screening - Comments:: Wears rx glasses - up to date with routine eye exams with [facility]   Dietary issues and exercise activities discussed: Current Exercise Habits: The patient does not participate in regular exercise at present, Exercise limited by: psychological condition(s);respiratory conditions(s);orthopedic condition(s);neurologic condition(s);cardiac condition(s)   Goals Addressed             This Visit's Progress    Patient declined health goal at this time.        Depression Screen    06/06/2022    4:14 PM 08/02/2020   11:04 AM 07/09/2019   10:51 AM 04/17/2018   10:58 AM 04/05/2017    1:39 PM 03/13/2016  1:17 PM 01/18/2015    1:20 PM  PHQ 2/9 Scores  PHQ - 2  Score 0 0 0 0 0 0 0    Fall Risk    06/06/2022    4:17 PM 05/01/2022    1:30 PM 04/28/2021    2:19 PM 04/13/2020   10:47 AM 07/09/2019   10:51 AM  Fall Risk   Falls in the past year? 0 0 0 0 0  Number falls in past yr: 0 0  0   Injury with Fall? 0 0  0   Risk for fall due to : No Fall Risks      Follow up Falls prevention discussed    Falls evaluation completed    FALL RISK PREVENTION PERTAINING TO THE HOME:  Any stairs in or around the home? Yes  If so, are there any without handrails? No  Home free of loose throw rugs in walkways, pet beds, electrical cords, etc? Yes  Adequate lighting in your home to reduce risk of falls? Yes   ASSISTIVE DEVICES UTILIZED TO PREVENT FALLS:  Life alert? No  Use of a cane, walker or w/c? No  Grab bars in the bathroom? Yes  Shower chair or bench in shower? Yes  Elevated toilet seat or a handicapped toilet? No   TIMED UP AND GO:  Was the test performed? No . Telephone visit.  Cognitive Function:    06/06/2022    4:18 PM 05/01/2022    2:00 PM 04/28/2021    2:00 PM 04/13/2020   11:00 AM 04/16/2019    4:00 PM  MMSE - Mini Mental State Exam  Not completed: Unable to complete Unable to complete     Orientation to time  1 0 1 1  Orientation to Place  0 1 0 5  Registration  '1 3 3 3  '$ Attention/ Calculation  0 0 1 0  Recall  0 0 0 0  Language- name 2 objects  '2 2 2 1  '$ Language- repeat   1 1 0  Language- follow 3 step command   '2 3 3  '$ Language- read & follow direction   '1 1 1  '$ Write a sentence   '1 1 1  '$ Copy design   0 1 1  Total score   '11 14 16      '$ 12/22/2014   12:01 PM  Montreal Cognitive Assessment   Visuospatial/ Executive (0/5) 3  Naming (0/3) 2  Attention: Read list of digits (0/2) 2  Attention: Read list of letters (0/1) 1  Attention: Serial 7 subtraction starting at 100 (0/3) 3  Language: Repeat phrase (0/2) 2  Language : Fluency (0/1) 1  Abstraction (0/2) 0  Delayed Recall (0/5) 0  Orientation (0/6) 1  Total 15  Adjusted  Score (based on education) 15      Immunizations Immunization History  Administered Date(s) Administered   Fluad Quad(high Dose 65+) 07/09/2019, 08/02/2020   H1N1 09/15/2008   Influenza Split 08/21/2012   Influenza Whole 07/29/2007, 07/08/2008, 08/03/2009, 07/27/2010   Influenza, High Dose Seasonal PF 06/19/2016, 07/12/2017, 07/30/2018   Influenza,inj,Quad PF,6+ Mos 06/25/2013, 07/13/2014, 08/10/2015, 07/06/2021   PFIZER Comirnaty(Gray Top)Covid-19 Tri-Sucrose Vaccine 02/02/2021   PFIZER(Purple Top)SARS-COV-2 Vaccination 11/03/2019, 11/24/2019, 07/15/2020   PNEUMOCOCCAL CONJUGATE-20 06/01/2021   Pneumococcal Conjugate-13 10/14/2014   Pneumococcal Polysaccharide-23 08/10/2005, 10/27/2009   Tdap 09/18/2016    TDAP status: Up to date  Flu Vaccine status: Due, Education has been provided regarding the importance of this vaccine. Advised may receive  this vaccine at local pharmacy or Health Dept. Aware to provide a copy of the vaccination record if obtained from local pharmacy or Health Dept. Verbalized acceptance and understanding.  Pneumococcal vaccine status: Up to date  Covid-19 vaccine status: Completed vaccines  Qualifies for Shingles Vaccine? Yes   Zostavax completed No   Shingrix Completed?: No.    Education has been provided regarding the importance of this vaccine. Patient has been advised to call insurance company to determine out of pocket expense if they have not yet received this vaccine. Advised may also receive vaccine at local pharmacy or Health Dept. Verbalized acceptance and understanding.  Screening Tests Health Maintenance  Topic Date Due   Zoster Vaccines- Shingrix (1 of 2) Never done   COVID-19 Vaccine (5 - Pfizer risk series) 03/30/2021   INFLUENZA VACCINE  05/09/2022   TETANUS/TDAP  09/18/2026   Pneumonia Vaccine 71+ Years old  Completed   DEXA SCAN  Completed   HPV VACCINES  Aged Out   COLONOSCOPY (Pts 45-43yr Insurance coverage will need to be  confirmed)  Discontinued    Health Maintenance  Health Maintenance Due  Topic Date Due   Zoster Vaccines- Shingrix (1 of 2) Never done   COVID-19 Vaccine (5 - Pfizer risk series) 03/30/2021   INFLUENZA VACCINE  05/09/2022    Colorectal cancer screening: No longer required.   Mammogram status: No longer required due to age.  Bone Density status: Completed 06/27/2017. Results reflect: Bone density results: OSTEOPOROSIS. Repeat every 2 years.  Lung Cancer Screening: (Low Dose CT Chest recommended if Age 922-80years, 30 pack-year currently smoking OR have quit w/in 15years.) does not qualify.   Lung Cancer Screening Referral: no  Additional Screening:  Hepatitis C Screening: does not qualify; Completed no  Vision Screening: Recommended annual ophthalmology exams for early detection of glaucoma and other disorders of the eye. Is the patient up to date with their annual eye exam?  Yes  Who is the provider or what is the name of the office in which the patient attends annual eye exams? LEdyth Gunnels OD. If pt is not established with a provider, would they like to be referred to a provider to establish care? No .   Dental Screening: Recommended annual dental exams for proper oral hygiene  Community Resource Referral / Chronic Care Management: CRR required this visit?  No   CCM required this visit?  No      Plan:     I have personally reviewed and noted the following in the patient's chart:   Medical and social history Use of alcohol, tobacco or illicit drugs  Current medications and supplements including opioid prescriptions. Patient is not currently taking opioid prescriptions. Functional ability and status Nutritional status Physical activity Advanced directives List of other physicians Hospitalizations, surgeries, and ER visits in previous 12 months Vitals Screenings to include cognitive, depression, and falls Referrals and appointments  In addition, I have  reviewed and discussed with patient certain preventive protocols, quality metrics, and best practice recommendations. A written personalized care plan for preventive services as well as general preventive health recommendations were provided to patient.     SSheral Flow LPN   [id number]/[id number]  Nurse Notes:  Patient has a diagnosis of Dementia and Alzheimer's Disease. Patient is followed by neurology for ongoing assessment. Patient is unable to complete screening 6CIT or MMSE.  There were no vitals filed for this visit. There is no height or weight on file to calculate BMI.  Medical screening examination/treatment/procedure(s) were performed by non-physician practitioner and as supervising physician I was immediately available for consultation/collaboration.  I agree with above. Lew Dawes, MD

## 2022-06-06 NOTE — Patient Instructions (Signed)
[name] , Thank you for taking time to come for your Medicare Wellness Visit. I appreciate your ongoing commitment to your health goals. Please review the following plan we discussed and let me know if I can assist you in the future.   Screening recommendations/referrals: Colonoscopy: Discontinued; No longer recommended due to age. Mammogram: Discontinued; No longer recommended due to age. Bone Density: Discontinued; No longer recommended due to age. Recommended yearly ophthalmology/optometry visit for glaucoma screening and checkup Recommended yearly dental visit for hygiene and checkup  Vaccinations: Influenza vaccine: 07/06/2021; due every Fall Season Pneumococcal vaccine: 10/27/2009 (Pneumococcal 23), 06/01/2021 (Prevnar 13, Prevnar 20) Tdap vaccine: 09/18/2016; due every 10 years Shingles vaccine: never done   Covid-19:11/03/2019, 11/14/2019, 07/15/2020, 02/02/2021  Advanced directives: Yes; Please bring a copy of your health care power of attorney and living will to the office at your convenience.  Conditions/risks identified: Yes  Next appointment: Please schedule your next Medicare Wellness Visit with your Nurse Health Advisor in 1 year by calling [phone].   Preventive Care 84 Years and Older, Female Preventive care refers to lifestyle choices and visits with your health care provider that can promote health and wellness. What does preventive care include? A yearly physical exam. This is also called an annual well check. Dental exams once or twice a year. Routine eye exams. Ask your health care provider how often you should have your eyes checked. Personal lifestyle choices, including: Daily care of your teeth and gums. Regular physical activity. Eating a healthy diet. Avoiding tobacco and drug use. Limiting alcohol use. Practicing safe sex. Taking low-dose aspirin every day. Taking vitamin and mineral supplements as recommended by your health care provider. What  happens during an annual well check? The services and screenings done by your health care provider during your annual well check will depend on your age, overall health, lifestyle risk factors, and family history of disease. Counseling  Your health care provider may ask you questions about your: Alcohol use. Tobacco use. Drug use. Emotional well-being. Home and relationship well-being. Sexual activity. Eating habits. History of falls. Memory and ability to understand (cognition). Work and work Statistician. Reproductive health. Screening  You may have the following tests or measurements: Height, weight, and BMI. Blood pressure. Lipid and cholesterol levels. These may be checked every 5 years, or more frequently if you are over 25 years old. Skin check. Lung cancer screening. You may have this screening every year starting at age 34 if you have a 30-pack-year history of smoking and currently smoke or have quit within the past 15 years. Fecal occult blood test (FOBT) of the stool. You may have this test every year starting at age 42. Flexible sigmoidoscopy or colonoscopy. You may have a sigmoidoscopy every 5 years or a colonoscopy every 10 years starting at age 28. Hepatitis C blood test. Hepatitis B blood test. Sexually transmitted disease (STD) testing. Diabetes screening. This is done by checking your blood sugar (glucose) after you have not eaten for a while (fasting). You may have this done every 1-3 years. Bone density scan. This is done to screen for osteoporosis. You may have this done starting at age 24. Mammogram. This may be done every 1-2 years. Talk to your health care provider about how often you should have regular mammograms. Talk with your health care provider about your test results, treatment options, and if necessary, the need for more tests. Vaccines  Your health care provider may recommend certain vaccines, such as: Influenza vaccine. This is  recommended every  year. Tetanus, diphtheria, and acellular pertussis (Tdap, Td) vaccine. You may need a Td booster every 10 years. Zoster vaccine. You may need this after age 31. Pneumococcal 13-valent conjugate (PCV13) vaccine. One dose is recommended after age 38. Pneumococcal polysaccharide (PPSV23) vaccine. One dose is recommended after age 56. Talk to your health care provider about which screenings and vaccines you need and how often you need them. This information is not intended to replace advice given to you by your health care provider. Make sure you discuss any questions you have with your health care provider. Document Released: 10/22/2015 Document Revised: 06/14/2016 Document Reviewed: 07/27/2015 Elsevier Interactive Patient Education  2017 [place] Prevention in the Home Falls can cause injuries. They can happen to people of all ages. There are many things you can do to make your home safe and to help prevent falls. What can I do on the outside of my home? Regularly fix the edges of walkways and driveways and fix any cracks. Remove anything that might make you trip as you walk through a door, such as a raised step or threshold. Trim any bushes or trees on the path to your home. Use bright outdoor lighting. Clear any walking paths of anything that might make someone trip, such as rocks or tools. Regularly check to see if handrails are loose or broken. Make sure that both sides of any steps have handrails. Any raised decks and porches should have guardrails on the edges. Have any leaves, snow, or ice cleared regularly. Use sand or salt on walking paths during winter. Clean up any spills in your garage right away. This includes oil or grease spills. What can I do in the bathroom? Use night lights. Install grab bars by the toilet and in the tub and shower. Do not use towel bars as grab bars. Use non-skid mats or decals in the tub or shower. If you need to sit down in the shower, use a  plastic, non-slip stool. Keep the floor dry. Clean up any water that spills on the floor as soon as it happens. Remove soap buildup in the tub or shower regularly. Attach bath mats securely with double-sided non-slip rug tape. Do not have throw rugs and other things on the floor that can make you trip. What can I do in the bedroom? Use night lights. Make sure that you have a light by your bed that is easy to reach. Do not use any sheets or blankets that are too big for your bed. They should not hang down onto the floor. Have a firm chair that has side arms. You can use this for support while you get dressed. Do not have throw rugs and other things on the floor that can make you trip. What can I do in the kitchen? Clean up any spills right away. Avoid walking on wet floors. Keep items that you use a lot in easy-to-reach places. If you need to reach something above you, use a strong step stool that has a grab bar. Keep electrical cords out of the way. Do not use floor polish or wax that makes floors slippery. If you must use wax, use non-skid floor wax. Do not have throw rugs and other things on the floor that can make you trip. What can I do with my stairs? Do not leave any items on the stairs. Make sure that there are handrails on both sides of the stairs and use them. Fix handrails that are  broken or loose. Make sure that handrails are as long as the stairways. Check any carpeting to make sure that it is firmly attached to the stairs. Fix any carpet that is loose or worn. Avoid having throw rugs at the top or bottom of the stairs. If you do have throw rugs, attach them to the floor with carpet tape. Make sure that you have a light switch at the top of the stairs and the bottom of the stairs. If you do not have them, ask someone to add them for you. What else can I do to help prevent falls? Wear shoes that: Do not have high heels. Have rubber bottoms. Are comfortable and fit you  well. Are closed at the toe. Do not wear sandals. If you use a stepladder: Make sure that it is fully opened. Do not climb a closed stepladder. Make sure that both sides of the stepladder are locked into place. Ask someone to hold it for you, if possible. Clearly mark and make sure that you can see: Any grab bars or handrails. First and last steps. Where the edge of each step is. Use tools that help you move around (mobility aids) if they are needed. These include: Canes. Walkers. Scooters. Crutches. Turn on the lights when you go into a dark area. Replace any light bulbs as soon as they burn out. Set up your furniture so you have a clear path. Avoid moving your furniture around. If any of your floors are uneven, fix them. If there are any pets around you, be aware of where they are. Review your medicines with your doctor. Some medicines can make you feel dizzy. This can increase your chance of falling. Ask your doctor what other things that you can do to help prevent falls. This information is not intended to replace advice given to you by your health care provider. Make sure you discuss any questions you have with your health care provider. Document Released: 07/22/2009 Document Revised: 03/02/2016 Document Reviewed: 10/30/2014 Elsevier Interactive Patient Education  2017 [employer].

## 2022-06-07 ENCOUNTER — Other Ambulatory Visit: Payer: Self-pay | Admitting: Internal Medicine

## 2022-06-07 MED ORDER — INCRUSE ELLIPTA 62.5 MCG/ACT IN AEPB: 1.0000 | INHALATION_SPRAY | Freq: Every day | RESPIRATORY_TRACT | 0 refills | Status: DC

## 2022-06-07 NOTE — Telephone Encounter (Signed)
Sent inhaler to mail order appt 06/08/22.Marland KitchenJohny Knapp

## 2022-06-08 ENCOUNTER — Ambulatory Visit (INDEPENDENT_AMBULATORY_CARE_PROVIDER_SITE_OTHER): Payer: Medicare Other | Admitting: Internal Medicine

## 2022-06-08 ENCOUNTER — Encounter: Payer: Self-pay | Admitting: Internal Medicine

## 2022-06-08 VITALS — BP 134/68 | HR 100 | Temp 98.0°F | Ht 62.0 in | Wt 142.8 lb

## 2022-06-08 DIAGNOSIS — E034 Atrophy of thyroid (acquired): Secondary | ICD-10-CM

## 2022-06-08 DIAGNOSIS — J449 Chronic obstructive pulmonary disease, unspecified: Secondary | ICD-10-CM

## 2022-06-08 DIAGNOSIS — E785 Hyperlipidemia, unspecified: Secondary | ICD-10-CM | POA: Diagnosis not present

## 2022-06-08 DIAGNOSIS — I10 Essential (primary) hypertension: Secondary | ICD-10-CM

## 2022-06-08 LAB — LIPID PANEL
Cholesterol: 150 mg/dL (ref 0–200)
HDL: 40.6 mg/dL (ref >39.00)
LDL Cholesterol: 73 mg/dL (ref 0–99)
NonHDL: 109.01
Total CHOL/HDL Ratio: 4
Triglycerides: 180 mg/dL — ABNORMAL HIGH (ref 0.0–149.0)
VLDL: 36 mg/dL (ref 0.0–40.0)

## 2022-06-08 LAB — COMPREHENSIVE METABOLIC PANEL
ALT: 29 U/L (ref 0–35)
AST: 23 U/L (ref 0–37)
Albumin: 4.1 g/dL (ref 3.5–5.2)
Alkaline Phosphatase: 72 U/L (ref 39–117)
BUN: 21 mg/dL (ref 6–23)
CO2: 34 mEq/L — ABNORMAL HIGH (ref 19–32)
Calcium: 9.7 mg/dL (ref 8.4–10.5)
Chloride: 100 mEq/L (ref 96–112)
Creatinine, Ser: 0.94 mg/dL (ref 0.40–1.20)
GFR: 55.71 mL/min — ABNORMAL LOW (ref >60.00)
Glucose, Bld: 213 mg/dL — ABNORMAL HIGH (ref 70–99)
Potassium: 4 mEq/L (ref 3.5–5.1)
Sodium: 142 mEq/L (ref 135–145)
Total Bilirubin: 0.6 mg/dL (ref 0.2–1.2)
Total Protein: 6.8 g/dL (ref 6.0–8.3)

## 2022-06-08 LAB — CBC WITH DIFFERENTIAL/PLATELET
Basophils Absolute: 0.1 10*3/uL (ref 0.0–0.1)
Basophils Relative: 0.5 % (ref 0.0–3.0)
Eosinophils Absolute: 0.1 10*3/uL (ref 0.0–0.7)
Eosinophils Relative: 1 % (ref 0.0–5.0)
HCT: 47.6 % — ABNORMAL HIGH (ref 36.0–46.0)
Hemoglobin: 15.6 g/dL — ABNORMAL HIGH (ref 12.0–15.0)
Lymphocytes Relative: 38.9 % (ref 12.0–46.0)
Lymphs Abs: 4.6 10*3/uL — ABNORMAL HIGH (ref 0.7–4.0)
MCHC: 32.8 g/dL (ref 30.0–36.0)
MCV: 89.8 fl (ref 78.0–100.0)
Monocytes Absolute: 0.6 10*3/uL (ref 0.1–1.0)
Monocytes Relative: 4.8 % (ref 3.0–12.0)
Neutro Abs: 6.4 10*3/uL (ref 1.4–7.7)
Neutrophils Relative %: 54.8 % (ref 43.0–77.0)
Platelets: 149 10*3/uL — ABNORMAL LOW (ref 150.0–400.0)
RBC: 5.3 Mil/uL — ABNORMAL HIGH (ref 3.87–5.11)
RDW: 13.3 % (ref 11.5–15.5)
WBC: 11.7 10*3/uL — ABNORMAL HIGH (ref 4.0–10.5)

## 2022-06-08 LAB — TSH: TSH: 2.42 u[IU]/mL (ref 0.35–5.50)

## 2022-06-08 NOTE — Assessment & Plan Note (Signed)
Smoking less Recent CXR was reviewed

## 2022-06-08 NOTE — Assessment & Plan Note (Signed)
Cont on Losartan, Furosemide Check CMET, UA

## 2022-06-08 NOTE — Assessment & Plan Note (Signed)
Chronic Cont on Levothroid Labs

## 2022-06-08 NOTE — Assessment & Plan Note (Signed)
Chronic  Cont on Simvastatin, ASA Check labs

## 2022-06-08 NOTE — Progress Notes (Signed)
Subjective:  Patient ID: [name], female    DOB: Mar 30, 1936  Age: 86 y.o. MRN: [id number]  CC: Medication Management   HPI [name] presents for memory loss, COPD, wt loss   Outpatient Medications Prior to Visit  Medication Sig Dispense Refill   acetaminophen (TYLENOL) 325 MG tablet Take 650 mg by mouth every 6 (six) hours as needed.     aspirin 81 MG EC tablet Take 81 mg by mouth daily.     Cholecalciferol (VITAMIN D3) 2000 units capsule Take 1 capsule (2,000 Units total) daily by mouth. 100 capsule 3   Diclofenac Sodium 1.5 % SOLN Place 3-5 drops onto the skin 3 (three) times daily as needed. For pain applies on joints     donepezil (ARICEPT) 10 MG tablet Take 1 tablet (10 mg total) by mouth at bedtime. Annual appt due in Oct must see provider for future refills 90 tablet 3   furosemide (LASIX) 20 MG tablet TAKE 2 TABLETS DAILY 180 tablet 1   losartan (COZAAR) 50 MG tablet TAKE 1 TABLET IN THE MORNING AND AT BEDTIME (FOLLOW UP APPOINTMENT IS DUE IN AUGUST WITH LABS, MUST SEE PROVIDER FOR FUTURE REFILLS) 180 tablet 3   memantine (NAMENDA) 10 MG tablet Take 1 tablet (10 mg total) by mouth 2 (two) times daily. Follow-up appt due in August must see provider for future refills 180 tablet 3   Multiple Vitamin (MULTIVITAMIN) tablet Take 1 tablet by mouth daily.     omeprazole (PRILOSEC) 40 MG capsule TAKE 1 CAPSULE DAILY 90 capsule 3   ondansetron (ZOFRAN) 4 MG tablet Take 1 tablet (4 mg total) by mouth every 6 (six) hours as needed. Take  1 tab every 6 hours as needed for nausea and vomiting. 20 tablet 1   simvastatin (ZOCOR) 40 MG tablet TAKE 1 TABLET DAILY (FOLLOW UP APPOINTMENT DUE IN AUGUST WITH LABS, MUST SEE PROVIDER FOR FUTURE REFILLS) 90 tablet 3   SYNTHROID 50 MCG tablet Take 1 tablet (50 mcg total) by mouth daily before breakfast. Annual appt due in Aug must see provider for future refills 90 tablet 0   umeclidinium bromide (INCRUSE ELLIPTA) 62.5 MCG/ACT  AEPB Inhale 1 puff into the lungs daily. 30 each 11   No facility-administered medications prior to visit.    ROS: Review of Systems  Constitutional:  Positive for fatigue and unexpected weight change. Negative for activity change, appetite change and chills.  HENT:  Negative for congestion, mouth sores and sinus pressure.   Eyes:  Negative for visual disturbance.  Respiratory:  Negative for cough and chest tightness.   Gastrointestinal:  Negative for abdominal pain and nausea.  Genitourinary:  Negative for difficulty urinating, frequency and vaginal pain.  Musculoskeletal:  Positive for arthralgias. Negative for back pain and gait problem.  Skin:  Negative for pallor and rash.  Neurological:  Negative for dizziness, tremors, weakness, numbness and headaches.  Psychiatric/Behavioral:  Positive for confusion and decreased concentration. Negative for sleep disturbance.     Objective:  BP 134/68 (BP Location: Left Arm)   Pulse 100   Temp 98 F (36.7 C) (Oral)   Ht '5\' 2"'$  (1.575 m)   Wt 142 lb 12.8 oz (64.8 kg)   SpO2 90%   BMI 26.12 kg/m   BP Readings from Last 3 Encounters:  06/08/22 134/68  05/01/22 104/76  05/01/22 110/60    Wt Readings from Last 3 Encounters:  06/08/22 142 lb 12.8 oz (64.8 kg)  05/01/22 130 lb  1.1 oz (59 kg)  05/01/22 130 lb (59 kg)    Physical Exam Dry skin - B feet Lab Results  Component Value Date   WBC 10.3 05/01/2022   HGB 16.2 (H) 05/01/2022   HCT 48.5 (H) 05/01/2022   PLT 184 05/01/2022   GLUCOSE 150 (H) 05/01/2022   CHOL 147 06/01/2021   TRIG 216.0 (H) 06/01/2021   HDL 38.30 (L) 06/01/2021   LDLDIRECT 93.0 06/01/2021   LDLCALC 70 12/16/2013   ALT 36 (H) 06/01/2021   AST 34 06/01/2021   NA 141 05/01/2022   K 3.9 05/01/2022   CL 103 05/01/2022   CREATININE 1.09 (H) 05/01/2022   BUN 18 05/01/2022   CO2 28 05/01/2022   TSH 1.99 06/01/2021   INR 1.15 08/10/2011   HGBA1C 5.8 10/12/2011    DG Chest 2 View  Result Date:  05/01/2022 CLINICAL DATA:  Shortness of breath EXAM: CHEST - 2 VIEW COMPARISON:  07/06/2020 FINDINGS: There is hyperinflation of the lungs compatible with COPD. Heart and mediastinal contours are within normal limits. No focal opacities or effusions. No acute bony abnormality. Aortic atherosclerosis. IMPRESSION: COPD/chronic changes.  No active disease Electronically Signed   By: Rolm Baptise M.D.   On: 05/01/2022 15:33    Assessment & Plan:   Problem List Items Addressed This Visit     COPD (chronic obstructive pulmonary disease) ([place])    Smoking less Recent CXR was reviewed      Dyslipidemia - Primary    Chronic  Cont on Simvastatin, ASA Check labs      Relevant Orders   TSH   Urinalysis   CBC with Differential/Platelet   Lipid panel   Comprehensive metabolic panel   Hypertension    Cont on Losartan, Furosemide Check CMET, UA      Relevant Orders   TSH   Urinalysis   CBC with Differential/Platelet   Lipid panel   Comprehensive metabolic panel   Hypothyroidism    Chronic Cont on Levothroid Labs      Relevant Orders   TSH   Urinalysis   CBC with Differential/Platelet   Lipid panel   Comprehensive metabolic panel      No orders of the defined types were placed in this encounter.     Follow-up: Return in about 4 months (around 10/08/2022) for a follow-up visit.  [name], MD

## 2022-06-13 ENCOUNTER — Other Ambulatory Visit (INDEPENDENT_AMBULATORY_CARE_PROVIDER_SITE_OTHER): Payer: Medicare Other

## 2022-06-13 DIAGNOSIS — E785 Hyperlipidemia, unspecified: Secondary | ICD-10-CM

## 2022-06-13 DIAGNOSIS — E034 Atrophy of thyroid (acquired): Secondary | ICD-10-CM

## 2022-06-13 DIAGNOSIS — I10 Essential (primary) hypertension: Secondary | ICD-10-CM | POA: Diagnosis not present

## 2022-06-13 LAB — URINALYSIS
Bilirubin Urine: NEGATIVE
Hgb urine dipstick: NEGATIVE
Ketones, ur: NEGATIVE
Leukocytes,Ua: NEGATIVE
Nitrite: NEGATIVE
Specific Gravity, Urine: 1.015 (ref 1.000–1.030)
Total Protein, Urine: NEGATIVE
Urine Glucose: >=1000 — AB
Urobilinogen, UA: 0.2 (ref 0.0–1.0)
pH: 5.5 (ref 5.0–8.0)

## 2022-06-16 ENCOUNTER — Other Ambulatory Visit: Payer: Self-pay | Admitting: Internal Medicine

## 2022-06-29 ENCOUNTER — Other Ambulatory Visit: Payer: Self-pay | Admitting: *Deleted

## 2022-06-29 MED ORDER — INCRUSE ELLIPTA 62.5 MCG/ACT IN AEPB: 1.0000 | INHALATION_SPRAY | Freq: Every day | RESPIRATORY_TRACT | 3 refills | Status: DC

## 2022-07-14 ENCOUNTER — Other Ambulatory Visit: Payer: Self-pay | Admitting: Internal Medicine

## 2022-07-24 ENCOUNTER — Other Ambulatory Visit: Payer: Self-pay | Admitting: Internal Medicine

## 2022-07-26 ENCOUNTER — Other Ambulatory Visit: Payer: Self-pay | Admitting: Internal Medicine

## 2022-08-28 NOTE — Telephone Encounter (Signed)
Prolia VOB initiated via [email or web address] 

## 2022-08-28 NOTE — Telephone Encounter (Signed)
Forwarding to Rx prior auth team

## 2022-09-04 ENCOUNTER — Other Ambulatory Visit (HOSPITAL_COMMUNITY): Payer: Self-pay

## 2022-09-05 NOTE — Telephone Encounter (Signed)
Pt ready for scheduling on or after 10/04/22   Out-of-pocket cost due at time of visit: $0   Primary: Medicare Prolia co-insurance: 20% (approximately $276) Admin fee co-insurance: 20% (approximately $25)   Secondary: T[employer]are Medicare Supp Prolia co-insurance: will consider Medicare co-insurance Admin fee co-insurance: will consider Medicare co-insurance   Deductible: will consider Medicare deductible   Prior Auth: not required PA# Valid:    ** This summary of benefits is an estimation of the patient's out-of-pocket cost. Exact cost may vary based on individual plan coverage.

## 2022-10-10 ENCOUNTER — Ambulatory Visit: Payer: Medicare Other | Admitting: Internal Medicine

## 2022-11-16 ENCOUNTER — Ambulatory Visit (INDEPENDENT_AMBULATORY_CARE_PROVIDER_SITE_OTHER): Payer: Medicare Other | Admitting: Internal Medicine

## 2022-11-16 ENCOUNTER — Encounter: Payer: Self-pay | Admitting: Internal Medicine

## 2022-11-16 VITALS — BP 132/80 | HR 95 | Temp 97.5°F | Ht 62.0 in | Wt 140.0 lb

## 2022-11-16 DIAGNOSIS — J449 Chronic obstructive pulmonary disease, unspecified: Secondary | ICD-10-CM

## 2022-11-16 DIAGNOSIS — G301 Alzheimer's disease with late onset: Secondary | ICD-10-CM | POA: Diagnosis not present

## 2022-11-16 DIAGNOSIS — R5383 Other fatigue: Secondary | ICD-10-CM | POA: Diagnosis not present

## 2022-11-16 DIAGNOSIS — R609 Edema, unspecified: Secondary | ICD-10-CM

## 2022-11-16 DIAGNOSIS — M810 Age-related osteoporosis without current pathological fracture: Secondary | ICD-10-CM | POA: Diagnosis not present

## 2022-11-16 DIAGNOSIS — F02B0 Dementia in other diseases classified elsewhere, moderate, without behavioral disturbance, psychotic disturbance, mood disturbance, and anxiety: Secondary | ICD-10-CM | POA: Diagnosis not present

## 2022-11-16 DIAGNOSIS — I10 Essential (primary) hypertension: Secondary | ICD-10-CM

## 2022-11-16 LAB — CBC WITH DIFFERENTIAL/PLATELET
Basophils Absolute: 0 10*3/uL (ref 0.0–0.1)
Basophils Relative: 0.4 % (ref 0.0–3.0)
Eosinophils Absolute: 0.1 10*3/uL (ref 0.0–0.7)
Eosinophils Relative: 0.9 % (ref 0.0–5.0)
HCT: 47.1 % — ABNORMAL HIGH (ref 36.0–46.0)
Hemoglobin: 15.6 g/dL — ABNORMAL HIGH (ref 12.0–15.0)
Lymphocytes Relative: 42.4 % (ref 12.0–46.0)
Lymphs Abs: 4.9 10*3/uL — ABNORMAL HIGH (ref 0.7–4.0)
MCHC: 33.2 g/dL (ref 30.0–36.0)
MCV: 89.9 fl (ref 78.0–100.0)
Monocytes Absolute: 0.6 10*3/uL (ref 0.1–1.0)
Monocytes Relative: 5.5 % (ref 3.0–12.0)
Neutro Abs: 5.8 10*3/uL (ref 1.4–7.7)
Neutrophils Relative %: 50.8 % (ref 43.0–77.0)
Platelets: 159 10*3/uL (ref 150.0–400.0)
RBC: 5.24 Mil/uL — ABNORMAL HIGH (ref 3.87–5.11)
RDW: 13.1 % (ref 11.5–15.5)
WBC: 11.5 10*3/uL — ABNORMAL HIGH (ref 4.0–10.5)

## 2022-11-16 LAB — COMPREHENSIVE METABOLIC PANEL
ALT: 33 U/L (ref 0–35)
AST: 22 U/L (ref 0–37)
Albumin: 4 g/dL (ref 3.5–5.2)
Alkaline Phosphatase: 71 U/L (ref 39–117)
BUN: 19 mg/dL (ref 6–23)
CO2: 35 mEq/L — ABNORMAL HIGH (ref 19–32)
Calcium: 9.5 mg/dL (ref 8.4–10.5)
Chloride: 98 mEq/L (ref 96–112)
Creatinine, Ser: 0.92 mg/dL (ref 0.40–1.20)
GFR: 56.99 mL/min — ABNORMAL LOW (ref >60.00)
Glucose, Bld: 205 mg/dL — ABNORMAL HIGH (ref 70–99)
Potassium: 3.7 mEq/L (ref 3.5–5.1)
Sodium: 142 mEq/L (ref 135–145)
Total Bilirubin: 0.6 mg/dL (ref 0.2–1.2)
Total Protein: 6.3 g/dL (ref 6.0–8.3)

## 2022-11-16 LAB — TSH: TSH: 2.68 u[IU]/mL (ref 0.35–5.50)

## 2022-11-16 MED ORDER — DENOSUMAB 60 MG/ML ~~LOC~~ SOSY
60.0000 mg | PREFILLED_SYRINGE | Freq: Once | SUBCUTANEOUS | Status: AC
  Administered 2022-11-16: 60 mg via SUBCUTANEOUS

## 2022-11-16 NOTE — Progress Notes (Signed)
Subjective:  Patient ID: [name], female    DOB: 05-23-1936  Age: 87 y.o. MRN: [id number]  CC: No chief complaint on file.   HPI [name] presents for dementia, osteoporosis, hypothyroidism f/u. Sleeping a lot, sedentary.  Outpatient Medications Prior to Visit  Medication Sig Dispense Refill   acetaminophen (TYLENOL) 325 MG tablet Take 650 mg by mouth every 6 (six) hours as needed.     aspirin 81 MG EC tablet Take 81 mg by mouth daily.     Cholecalciferol (VITAMIN D3) 2000 units capsule Take 1 capsule (2,000 Units total) daily by mouth. 100 capsule 3   Diclofenac Sodium 1.5 % SOLN Place 3-5 drops onto the skin 3 (three) times daily as needed. For pain applies on joints     donepezil (ARICEPT) 10 MG tablet Take 1 tablet (10 mg total) by mouth at bedtime. Annual appt due in Oct must see provider for future refills 90 tablet 3   furosemide (LASIX) 20 MG tablet Take 2 tablets (40 mg total) by mouth daily. 180 tablet 2   losartan (COZAAR) 50 MG tablet Take 1 tablet (50 mg total) by mouth daily. 180 tablet 3   memantine (NAMENDA) 10 MG tablet Take 1 tablet (10 mg total) by mouth 2 (two) times daily. Follow-up appt due in August must see provider for future refills 180 tablet 3   Multiple Vitamin (MULTIVITAMIN) tablet Take 1 tablet by mouth daily.     omeprazole (PRILOSEC) 40 MG capsule TAKE 1 CAPSULE DAILY 90 capsule 3   ondansetron (ZOFRAN) 4 MG tablet Take 1 tablet (4 mg total) by mouth every 6 (six) hours as needed. Take  1 tab every 6 hours as needed for nausea and vomiting. 20 tablet 1   simvastatin (ZOCOR) 40 MG tablet Take 1 tablet (40 mg total) by mouth daily at 6 PM. 90 tablet 3   SYNTHROID 50 MCG tablet Take 1 tablet (50 mcg total) by mouth daily before breakfast. 90 tablet 3   umeclidinium bromide (INCRUSE ELLIPTA) 62.5 MCG/ACT AEPB Inhale 1 puff into the lungs daily. 90 each 3   No facility-administered medications prior to visit.    ROS: Review of  Systems  Constitutional:  Negative for activity change, appetite change, chills, fatigue and unexpected weight change.  HENT:  Negative for congestion, mouth sores and sinus pressure.   Eyes:  Negative for visual disturbance.  Respiratory:  Negative for cough and chest tightness.   Gastrointestinal:  Negative for abdominal pain and nausea.  Genitourinary:  Negative for difficulty urinating, frequency and vaginal pain.  Musculoskeletal:  Positive for gait problem. Negative for back pain.  Skin:  Negative for pallor and rash.  Neurological:  Negative for dizziness, tremors, weakness, numbness and headaches.  Psychiatric/Behavioral:  Positive for behavioral problems, confusion and decreased concentration. Negative for sleep disturbance and suicidal ideas.     Objective:  BP 132/80 (BP Location: Left Arm, Patient Position: Sitting, Cuff Size: Normal)   Pulse 95   Temp (!) 97.5 F (36.4 C) (Oral)   Ht '5\' 2"'$  (1.575 m)   Wt 140 lb (63.5 kg)   SpO2 96%   BMI 25.61 kg/m   BP Readings from Last 3 Encounters:  11/16/22 132/80  06/08/22 134/68  05/01/22 104/76    Wt Readings from Last 3 Encounters:  11/16/22 140 lb (63.5 kg)  06/08/22 142 lb 12.8 oz (64.8 kg)  05/01/22 130 lb 1.1 oz (59 kg)    Physical Exam Constitutional:  General: She is not in acute distress.    Appearance: Normal appearance. She is well-developed.  HENT:     Head: Normocephalic.     Right Ear: External ear normal.     Left Ear: External ear normal.     Nose: Nose normal.  Eyes:     General:        Right eye: No discharge.        Left eye: No discharge.     Conjunctiva/sclera: Conjunctivae normal.     Pupils: Pupils are equal, round, and reactive to light.  Neck:     Thyroid: No thyromegaly.     Vascular: No JVD.     Trachea: No tracheal deviation.  Cardiovascular:     Rate and Rhythm: Normal rate and regular rhythm.     Heart sounds: Normal heart sounds.  Pulmonary:     Effort: No respiratory  distress.     Breath sounds: No stridor. No wheezing.  Abdominal:     General: Bowel sounds are normal. There is no distension.     Palpations: Abdomen is soft. There is no mass.     Tenderness: There is no abdominal tenderness. There is no guarding or rebound.  Musculoskeletal:        General: No tenderness.     Cervical back: Normal range of motion and neck supple. No rigidity.  Lymphadenopathy:     Cervical: No cervical adenopathy.  Skin:    Findings: No erythema or rash.  Neurological:     Mental Status: She is disoriented.     Cranial Nerves: No cranial nerve deficit.     Motor: No abnormal muscle tone.     Coordination: Coordination normal.     Gait: Gait abnormal.     Deep Tendon Reflexes: Reflexes normal.  Psychiatric:        Behavior: Behavior normal.        Thought Content: Thought content normal.        Judgment: Judgment normal.  Coughed x1  Lab Results  Component Value Date   WBC 11.7 (H) 06/08/2022   HGB 15.6 (H) 06/08/2022   HCT 47.6 (H) 06/08/2022   PLT 149.0 (L) 06/08/2022   GLUCOSE 213 (H) 06/08/2022   CHOL 150 06/08/2022   TRIG 180.0 (H) 06/08/2022   HDL 40.60 06/08/2022   LDLDIRECT 93.0 06/01/2021   LDLCALC 73 06/08/2022   ALT 29 06/08/2022   AST 23 06/08/2022   NA 142 06/08/2022   K 4.0 06/08/2022   CL 100 06/08/2022   CREATININE 0.94 06/08/2022   BUN 21 06/08/2022   CO2 34 (H) 06/08/2022   TSH 2.42 06/08/2022   INR 1.15 08/10/2011   HGBA1C 5.8 10/12/2011    DG Chest 2 View  Result Date: 05/01/2022 CLINICAL DATA:  Shortness of breath EXAM: CHEST - 2 VIEW COMPARISON:  07/06/2020 FINDINGS: There is hyperinflation of the lungs compatible with COPD. Heart and mediastinal contours are within normal limits. No focal opacities or effusions. No acute bony abnormality. Aortic atherosclerosis. IMPRESSION: COPD/chronic changes.  No active disease Electronically Signed   By: Rolm Baptise M.D.   On: 05/01/2022 15:33    Assessment & Plan:   Problem  List Items Addressed This Visit       Cardiovascular and Mediastinum   Hypertension    Pt is not dizzy standing up now, no falls.      Relevant Orders   CBC with Differential/Platelet   TSH   Comprehensive metabolic panel  Respiratory   COPD (chronic obstructive pulmonary disease) ([facility])    In a smoker Cont on Incruse Ellipta        Nervous and Auditory   Alzheimer's dementia ([place])    Progressive sx's Cont on Aricept and Namenda po        Other   Fatigue    Sleeping a lot, sedentary.      Relevant Orders   CBC with Differential/Platelet   TSH   Comprehensive metabolic panel   Edema - Primary    Better      Relevant Orders   CBC with Differential/Platelet   TSH   Comprehensive metabolic panel      No orders of the defined types were placed in this encounter.     Follow-up: Return in about 3 months (around 02/14/2023) for a follow-up visit.  [name], MD

## 2022-11-16 NOTE — Assessment & Plan Note (Signed)
Progressive sx's Cont on Aricept and Namenda po

## 2022-11-16 NOTE — Assessment & Plan Note (Signed)
Better  

## 2022-11-16 NOTE — Assessment & Plan Note (Signed)
Pt is not dizzy standing up now, no falls.

## 2022-11-16 NOTE — Assessment & Plan Note (Signed)
In a smoker Cont on Incruse Ellipta

## 2022-11-16 NOTE — Assessment & Plan Note (Signed)
Sleeping a lot, sedentary.

## 2022-11-19 ENCOUNTER — Encounter: Payer: Self-pay | Admitting: Internal Medicine

## 2022-11-19 DIAGNOSIS — R739 Hyperglycemia, unspecified: Secondary | ICD-10-CM | POA: Insufficient documentation

## 2023-02-14 ENCOUNTER — Ambulatory Visit: Payer: Medicare Other | Admitting: Internal Medicine

## 2023-03-02 ENCOUNTER — Emergency Department (HOSPITAL_COMMUNITY): Payer: Medicare Other

## 2023-03-02 ENCOUNTER — Inpatient Hospital Stay (HOSPITAL_COMMUNITY)
Admission: EM | Admit: 2023-03-02 | Discharge: 2023-03-07 | DRG: 689 | Disposition: A | Discharge status: Skilled Nursing Facility | Payer: Medicare Other | Attending: Internal Medicine | Admitting: Internal Medicine

## 2023-03-02 ENCOUNTER — Other Ambulatory Visit: Payer: Self-pay

## 2023-03-02 DIAGNOSIS — N390 Urinary tract infection, site not specified: Principal | ICD-10-CM | POA: Diagnosis present

## 2023-03-02 DIAGNOSIS — D696 Thrombocytopenia, unspecified: Secondary | ICD-10-CM | POA: Diagnosis not present

## 2023-03-02 DIAGNOSIS — D751 Secondary polycythemia: Secondary | ICD-10-CM | POA: Diagnosis not present

## 2023-03-02 DIAGNOSIS — K219 Gastro-esophageal reflux disease without esophagitis: Secondary | ICD-10-CM | POA: Diagnosis present

## 2023-03-02 DIAGNOSIS — E039 Hypothyroidism, unspecified: Secondary | ICD-10-CM | POA: Diagnosis present

## 2023-03-02 DIAGNOSIS — Y92009 Unspecified place in unspecified non-institutional (private) residence as the place of occurrence of the external cause: Secondary | ICD-10-CM | POA: Diagnosis not present

## 2023-03-02 DIAGNOSIS — Z8249 Family history of ischemic heart disease and other diseases of the circulatory system: Secondary | ICD-10-CM

## 2023-03-02 DIAGNOSIS — G309 Alzheimer's disease, unspecified: Secondary | ICD-10-CM | POA: Diagnosis present

## 2023-03-02 DIAGNOSIS — Z79890 Hormone replacement therapy (postmenopausal): Secondary | ICD-10-CM | POA: Diagnosis not present

## 2023-03-02 DIAGNOSIS — Z9842 Cataract extraction status, left eye: Secondary | ICD-10-CM

## 2023-03-02 DIAGNOSIS — Z90710 Acquired absence of both cervix and uterus: Secondary | ICD-10-CM

## 2023-03-02 DIAGNOSIS — R000 Tachycardia, unspecified: Secondary | ICD-10-CM | POA: Diagnosis present

## 2023-03-02 DIAGNOSIS — F419 Anxiety disorder, unspecified: Secondary | ICD-10-CM | POA: Diagnosis present

## 2023-03-02 DIAGNOSIS — Z885 Allergy status to narcotic agent status: Secondary | ICD-10-CM

## 2023-03-02 DIAGNOSIS — J441 Chronic obstructive pulmonary disease with (acute) exacerbation: Secondary | ICD-10-CM | POA: Diagnosis not present

## 2023-03-02 DIAGNOSIS — E785 Hyperlipidemia, unspecified: Secondary | ICD-10-CM | POA: Diagnosis present

## 2023-03-02 DIAGNOSIS — Z9841 Cataract extraction status, right eye: Secondary | ICD-10-CM

## 2023-03-02 DIAGNOSIS — Z79899 Other long term (current) drug therapy: Secondary | ICD-10-CM | POA: Diagnosis not present

## 2023-03-02 DIAGNOSIS — J9601 Acute respiratory failure with hypoxia: Secondary | ICD-10-CM | POA: Diagnosis present

## 2023-03-02 DIAGNOSIS — E034 Atrophy of thyroid (acquired): Secondary | ICD-10-CM | POA: Diagnosis not present

## 2023-03-02 DIAGNOSIS — E876 Hypokalemia: Secondary | ICD-10-CM | POA: Diagnosis present

## 2023-03-02 DIAGNOSIS — D72829 Elevated white blood cell count, unspecified: Secondary | ICD-10-CM | POA: Diagnosis present

## 2023-03-02 DIAGNOSIS — R2689 Other abnormalities of gait and mobility: Secondary | ICD-10-CM | POA: Diagnosis not present

## 2023-03-02 DIAGNOSIS — F02C0 Dementia in other diseases classified elsewhere, severe, without behavioral disturbance, psychotic disturbance, mood disturbance, and anxiety: Secondary | ICD-10-CM | POA: Diagnosis present

## 2023-03-02 DIAGNOSIS — M8589 Other specified disorders of bone density and structure, multiple sites: Secondary | ICD-10-CM | POA: Diagnosis not present

## 2023-03-02 DIAGNOSIS — S0083XA Contusion of other part of head, initial encounter: Secondary | ICD-10-CM | POA: Diagnosis present

## 2023-03-02 DIAGNOSIS — F17210 Nicotine dependence, cigarettes, uncomplicated: Secondary | ICD-10-CM | POA: Diagnosis present

## 2023-03-02 DIAGNOSIS — A419 Sepsis, unspecified organism: Secondary | ICD-10-CM | POA: Diagnosis not present

## 2023-03-02 DIAGNOSIS — B961 Klebsiella pneumoniae [K. pneumoniae] as the cause of diseases classified elsewhere: Secondary | ICD-10-CM | POA: Diagnosis present

## 2023-03-02 DIAGNOSIS — W19XXXA Unspecified fall, initial encounter: Principal | ICD-10-CM | POA: Diagnosis present

## 2023-03-02 DIAGNOSIS — R4182 Altered mental status, unspecified: Secondary | ICD-10-CM | POA: Diagnosis not present

## 2023-03-02 DIAGNOSIS — J439 Emphysema, unspecified: Secondary | ICD-10-CM | POA: Diagnosis present

## 2023-03-02 DIAGNOSIS — R319 Hematuria, unspecified: Secondary | ICD-10-CM | POA: Diagnosis present

## 2023-03-02 DIAGNOSIS — I10 Essential (primary) hypertension: Secondary | ICD-10-CM | POA: Diagnosis present

## 2023-03-02 DIAGNOSIS — Z9181 History of falling: Secondary | ICD-10-CM | POA: Diagnosis not present

## 2023-03-02 DIAGNOSIS — Z9049 Acquired absence of other specified parts of digestive tract: Secondary | ICD-10-CM

## 2023-03-02 DIAGNOSIS — S199XXA Unspecified injury of neck, initial encounter: Secondary | ICD-10-CM | POA: Diagnosis not present

## 2023-03-02 DIAGNOSIS — Z1611 Resistance to penicillins: Secondary | ICD-10-CM | POA: Diagnosis present

## 2023-03-02 DIAGNOSIS — E8809 Other disorders of plasma-protein metabolism, not elsewhere classified: Secondary | ICD-10-CM | POA: Diagnosis present

## 2023-03-02 DIAGNOSIS — I6782 Cerebral ischemia: Secondary | ICD-10-CM | POA: Diagnosis not present

## 2023-03-02 DIAGNOSIS — Z043 Encounter for examination and observation following other accident: Secondary | ICD-10-CM | POA: Diagnosis not present

## 2023-03-02 DIAGNOSIS — I739 Peripheral vascular disease, unspecified: Secondary | ICD-10-CM | POA: Diagnosis present

## 2023-03-02 DIAGNOSIS — M8580 Other specified disorders of bone density and structure, unspecified site: Secondary | ICD-10-CM | POA: Diagnosis present

## 2023-03-02 DIAGNOSIS — R0902 Hypoxemia: Secondary | ICD-10-CM | POA: Diagnosis not present

## 2023-03-02 DIAGNOSIS — F0280 Dementia in other diseases classified elsewhere without behavioral disturbance: Secondary | ICD-10-CM | POA: Diagnosis present

## 2023-03-02 DIAGNOSIS — Z8719 Personal history of other diseases of the digestive system: Secondary | ICD-10-CM

## 2023-03-02 DIAGNOSIS — F17200 Nicotine dependence, unspecified, uncomplicated: Secondary | ICD-10-CM | POA: Diagnosis present

## 2023-03-02 DIAGNOSIS — G301 Alzheimer's disease with late onset: Secondary | ICD-10-CM | POA: Diagnosis not present

## 2023-03-02 DIAGNOSIS — N3000 Acute cystitis without hematuria: Secondary | ICD-10-CM

## 2023-03-02 DIAGNOSIS — R2681 Unsteadiness on feet: Secondary | ICD-10-CM | POA: Diagnosis not present

## 2023-03-02 DIAGNOSIS — Z961 Presence of intraocular lens: Secondary | ICD-10-CM | POA: Diagnosis present

## 2023-03-02 DIAGNOSIS — Z86010 Personal history of colonic polyps: Secondary | ICD-10-CM

## 2023-03-02 DIAGNOSIS — M6281 Muscle weakness (generalized): Secondary | ICD-10-CM | POA: Diagnosis not present

## 2023-03-02 DIAGNOSIS — N289 Disorder of kidney and ureter, unspecified: Secondary | ICD-10-CM | POA: Diagnosis present

## 2023-03-02 DIAGNOSIS — Z8711 Personal history of peptic ulcer disease: Secondary | ICD-10-CM

## 2023-03-02 DIAGNOSIS — Z9851 Tubal ligation status: Secondary | ICD-10-CM

## 2023-03-02 DIAGNOSIS — R1312 Dysphagia, oropharyngeal phase: Secondary | ICD-10-CM | POA: Diagnosis not present

## 2023-03-02 DIAGNOSIS — Z888 Allergy status to other drugs, medicaments and biological substances status: Secondary | ICD-10-CM

## 2023-03-02 DIAGNOSIS — N3001 Acute cystitis with hematuria: Secondary | ICD-10-CM | POA: Diagnosis not present

## 2023-03-02 DIAGNOSIS — Z7982 Long term (current) use of aspirin: Secondary | ICD-10-CM

## 2023-03-02 DIAGNOSIS — R531 Weakness: Secondary | ICD-10-CM | POA: Diagnosis present

## 2023-03-02 DIAGNOSIS — E1165 Type 2 diabetes mellitus with hyperglycemia: Secondary | ICD-10-CM | POA: Diagnosis not present

## 2023-03-02 DIAGNOSIS — Z8701 Personal history of pneumonia (recurrent): Secondary | ICD-10-CM

## 2023-03-02 LAB — CBC WITH DIFFERENTIAL/PLATELET
Abs Immature Granulocytes: 0.08 10*3/uL — ABNORMAL HIGH (ref 0.00–0.07)
Basophils Absolute: 0 10*3/uL (ref 0.0–0.1)
Basophils Relative: 0 %
Eosinophils Absolute: 0 10*3/uL (ref 0.0–0.5)
Eosinophils Relative: 0 %
HCT: 48.5 % — ABNORMAL HIGH (ref 36.0–46.0)
Hemoglobin: 16.2 g/dL — ABNORMAL HIGH (ref 12.0–15.0)
Immature Granulocytes: 1 %
Lymphocytes Relative: 14 %
Lymphs Abs: 1.6 10*3/uL (ref 0.7–4.0)
MCH: 29.9 pg (ref 26.0–34.0)
MCHC: 33.4 g/dL (ref 30.0–36.0)
MCV: 89.5 fL (ref 80.0–100.0)
Monocytes Absolute: 0.7 10*3/uL (ref 0.1–1.0)
Monocytes Relative: 6 %
Neutro Abs: 8.7 10*3/uL — ABNORMAL HIGH (ref 1.7–7.7)
Neutrophils Relative %: 79 %
Platelets: 131 10*3/uL — ABNORMAL LOW (ref 150–400)
RBC: 5.42 MIL/uL — ABNORMAL HIGH (ref 3.87–5.11)
RDW: 12.5 % (ref 11.5–15.5)
WBC: 11.1 10*3/uL — ABNORMAL HIGH (ref 4.0–10.5)
nRBC: 0 % (ref 0.0–0.2)

## 2023-03-02 LAB — COMPREHENSIVE METABOLIC PANEL
ALT: 36 U/L (ref 0–44)
AST: 53 U/L — ABNORMAL HIGH (ref 15–41)
Albumin: 3.4 g/dL — ABNORMAL LOW (ref 3.5–5.0)
Alkaline Phosphatase: 57 U/L (ref 38–126)
Anion gap: 13 (ref 5–15)
BUN: 20 mg/dL (ref 8–23)
CO2: 25 mmol/L (ref 22–32)
Calcium: 8.1 mg/dL — ABNORMAL LOW (ref 8.9–10.3)
Chloride: 104 mmol/L (ref 98–111)
Creatinine, Ser: 1.09 mg/dL — ABNORMAL HIGH (ref 0.44–1.00)
GFR, Estimated: 50 mL/min — ABNORMAL LOW (ref >60)
Glucose, Bld: 168 mg/dL — ABNORMAL HIGH (ref 70–99)
Potassium: 3 mmol/L — ABNORMAL LOW (ref 3.5–5.1)
Sodium: 142 mmol/L (ref 135–145)
Total Bilirubin: 1.9 mg/dL — ABNORMAL HIGH (ref 0.3–1.2)
Total Protein: 6.1 g/dL — ABNORMAL LOW (ref 6.5–8.1)

## 2023-03-02 LAB — CK: Total CK: 725 U/L — ABNORMAL HIGH (ref 38–234)

## 2023-03-02 LAB — LACTIC ACID, PLASMA: Lactic Acid, Venous: 1.8 mmol/L (ref 0.5–1.9)

## 2023-03-02 MED ORDER — SIMVASTATIN 20 MG PO TABS
40.0000 mg | ORAL_TABLET | Freq: Every day | ORAL | Status: DC
  Administered 2023-03-03 – 2023-03-06 (×4): 40 mg via ORAL
  Filled 2023-03-02 (×4): qty 2

## 2023-03-02 MED ORDER — LEVOTHYROXINE SODIUM 50 MCG PO TABS
50.0000 ug | ORAL_TABLET | Freq: Every day | ORAL | Status: DC
  Administered 2023-03-03 – 2023-03-07 (×5): 50 ug via ORAL
  Filled 2023-03-02 (×2): qty 1
  Filled 2023-03-02: qty 2
  Filled 2023-03-02 (×2): qty 1

## 2023-03-02 MED ORDER — LOSARTAN POTASSIUM 50 MG PO TABS
50.0000 mg | ORAL_TABLET | Freq: Every day | ORAL | Status: DC
  Administered 2023-03-03 – 2023-03-07 (×5): 50 mg via ORAL
  Filled 2023-03-02 (×5): qty 1

## 2023-03-02 MED ORDER — MEMANTINE HCL 10 MG PO TABS
10.0000 mg | ORAL_TABLET | Freq: Two times a day (BID) | ORAL | Status: DC
  Administered 2023-03-03 – 2023-03-07 (×9): 10 mg via ORAL
  Filled 2023-03-02 (×9): qty 1

## 2023-03-02 MED ORDER — DONEPEZIL HCL 10 MG PO TABS
10.0000 mg | ORAL_TABLET | Freq: Every day | ORAL | Status: DC
  Administered 2023-03-03 – 2023-03-06 (×4): 10 mg via ORAL
  Filled 2023-03-02 (×4): qty 1

## 2023-03-02 MED ORDER — VITAMIN D 25 MCG (1000 UNIT) PO TABS
2000.0000 [IU] | ORAL_TABLET | Freq: Every day | ORAL | Status: DC
  Administered 2023-03-03 – 2023-03-07 (×5): 2000 [IU] via ORAL
  Filled 2023-03-02 (×5): qty 2

## 2023-03-02 MED ORDER — POTASSIUM CHLORIDE CRYS ER 20 MEQ PO TBCR
40.0000 meq | EXTENDED_RELEASE_TABLET | Freq: Once | ORAL | Status: AC
  Administered 2023-03-03: 40 meq via ORAL
  Filled 2023-03-02: qty 2

## 2023-03-02 MED ORDER — LACTATED RINGERS IV BOLUS
1000.0000 mL | Freq: Once | INTRAVENOUS | Status: AC
  Administered 2023-03-02: 1000 mL via INTRAVENOUS

## 2023-03-02 MED ORDER — UMECLIDINIUM BROMIDE 62.5 MCG/ACT IN AEPB: 1.0000 | INHALATION_SPRAY | Freq: Every day | RESPIRATORY_TRACT | Status: DC

## 2023-03-02 MED ORDER — ACETAMINOPHEN 325 MG PO TABS: 650.0000 mg | ORAL_TABLET | Freq: Four times a day (QID) | ORAL | Status: DC | PRN

## 2023-03-02 MED ORDER — ADULT MULTIVITAMIN W/MINERALS CH
1.0000 | ORAL_TABLET | Freq: Every day | ORAL | Status: DC
  Administered 2023-03-03 – 2023-03-07 (×5): 1 via ORAL
  Filled 2023-03-02 (×5): qty 1

## 2023-03-02 MED ORDER — POTASSIUM CHLORIDE CRYS ER 20 MEQ PO TBCR
40.0000 meq | EXTENDED_RELEASE_TABLET | Freq: Once | ORAL | Status: AC
  Administered 2023-03-04: 40 meq via ORAL
  Filled 2023-03-02 (×2): qty 2

## 2023-03-02 MED ORDER — ASPIRIN 81 MG PO TBEC
81.0000 mg | DELAYED_RELEASE_TABLET | Freq: Every day | ORAL | Status: DC
  Administered 2023-03-03 – 2023-03-07 (×5): 81 mg via ORAL
  Filled 2023-03-02 (×5): qty 1

## 2023-03-02 NOTE — ED Provider Notes (Incomplete)
11:20 PM Assumed care from Dr. Theresia Lo, please see their note for full history, physical and decision making until this point. In brief this is a 87 y.o. year old female who presented to the ED tonight with Fall     Probably fell trying to pick up her husband. Pending coox. Family here but can't take care of her. She has bad dementia. Husband likely to be admitted so patient will need [facility] consult for possible placement.   Discharge instructions, including strict return precautions for new or worsening symptoms, given. Patient and/or family verbalized understanding and agreement with the plan as described.   Labs, studies and imaging reviewed by myself and considered in medical decision making if ordered. Imaging interpreted by radiology.  Labs Reviewed  CBC WITH DIFFERENTIAL/PLATELET - Abnormal; Notable for the following components:      Result Value   WBC 11.1 (*)    RBC 5.42 (*)    Hemoglobin 16.2 (*)    HCT 48.5 (*)    Platelets 131 (*)    Neutro Abs 8.7 (*)    Abs Immature Granulocytes 0.08 (*)    All other components within normal limits  CK - Abnormal; Notable for the following components:   Total CK 725 (*)    All other components within normal limits  COMPREHENSIVE METABOLIC PANEL - Abnormal; Notable for the following components:   Potassium 3.0 (*)    Glucose, Bld 168 (*)    Creatinine, Ser 1.09 (*)    Calcium 8.1 (*)    Total Protein 6.1 (*)    Albumin 3.4 (*)    AST 53 (*)    Total Bilirubin 1.9 (*)    GFR, Estimated 50 (*)    All other components within normal limits  LACTIC ACID, PLASMA  URINALYSIS, W/ REFLEX TO CULTURE (INFECTION SUSPECTED)  PROTIME-INR  COOXEMETRY PANEL    DG Chest Port 1 View  Final Result    DG Pelvis Portable  Final Result    CT Head Wo Contrast  Final Result    CT Cervical Spine Wo Contrast  Final Result      No follow-ups on file.

## 2023-03-02 NOTE — ED Provider Notes (Signed)
11:20 PM Assumed care from Dr. Theresia Lo, please see their note for full history, physical and decision making until this point. In brief this is a 87 y.o. year old female who presented to the ED tonight with Fall     Probably fell trying to pick up her husband. Pending coox. Family here but can't take care of her. She has bad dementia. Husband likely to be admitted so patient will need [facility] consult for possible placement.   Patient found to have UTI with mild leukocytosis and mild tachycardia.  I suspect that she would benefit from IV antibiotics until cultures clear and appropriate living situation can be obtained. Discussed with [facility] for admission.   Labs, studies and imaging reviewed by myself and considered in medical decision making if ordered. Imaging interpreted by radiology.  Labs Reviewed  CBC WITH DIFFERENTIAL/PLATELET - Abnormal; Notable for the following components:      Result Value   WBC 11.1 (*)    RBC 5.42 (*)    Hemoglobin 16.2 (*)    HCT 48.5 (*)    Platelets 131 (*)    Neutro Abs 8.7 (*)    Abs Immature Granulocytes 0.08 (*)    All other components within normal limits  CK - Abnormal; Notable for the following components:   Total CK 725 (*)    All other components within normal limits  COMPREHENSIVE METABOLIC PANEL - Abnormal; Notable for the following components:   Potassium 3.0 (*)    Glucose, Bld 168 (*)    Creatinine, Ser 1.09 (*)    Calcium 8.1 (*)    Total Protein 6.1 (*)    Albumin 3.4 (*)    AST 53 (*)    Total Bilirubin 1.9 (*)    GFR, Estimated 50 (*)    All other components within normal limits  LACTIC ACID, PLASMA  URINALYSIS, W/ REFLEX TO CULTURE (INFECTION SUSPECTED)  PROTIME-INR  COOXEMETRY PANEL    DG Chest Port 1 View  Final Result    DG Pelvis Portable  Final Result    CT Head Wo Contrast  Final Result    CT Cervical Spine Wo Contrast  Final Result      No follow-ups on file.    Marily Memos, MD 03/03/23 [phone]

## 2023-03-02 NOTE — ED Provider Notes (Signed)
[place] EMERGENCY DEPARTMENT AT [facility] Provider Note   CSN: [id number] Arrival date & time: 03/02/23  1959     History  Chief Complaint  Patient presents with   [name] is a 87 y.o. female.  Patient is an 87 year old female with a past medical history of dementia ANO x 1 at baseline, hypertension, COPD presenting to the emergency department after being found down at home.  Per patient's family her husband cares for her at home.  They state that he was complaining of dizziness and they think that he may have fallen yesterday.  They state that she was post to have a hair appointment today but did not show up and her hairdresser went to her house and doors were locked with the cars in the driveway.  They state that they called police for a welfare check and the patient and her husband were both found on the ground and unable to get up.  States that the patient is at her baseline at this time.  She denies any pain.  The history is provided by the EMS personnel and a relative. History limited by: Level 5 caveat for dementia.  Fall       Home Medications Prior to Admission medications   Medication Sig Start Date End Date Taking? Authorizing Provider  acetaminophen (TYLENOL) 325 MG tablet Take 650 mg by mouth every 6 (six) hours as needed.    [provider]  aspirin 81 MG EC tablet Take 81 mg by mouth daily.    [provider]  Cholecalciferol (VITAMIN D3) 2000 units capsule Take 1 capsule (2,000 Units total) daily by mouth. 08/15/17   Plotnikov, Georgina Quint, MD  Diclofenac Sodium 1.5 % SOLN Place 3-5 drops onto the skin 3 (three) times daily as needed. For pain applies on joints    [provider]  donepezil (ARICEPT) 10 MG tablet Take 1 tablet (10 mg total) by mouth at bedtime. Annual appt due in Oct must see provider for future refills 05/01/22   Drema Dallas, DO  furosemide (LASIX) 20 MG tablet Take 2 tablets (40 mg  total) by mouth daily. 07/24/22   Plotnikov, Georgina Quint, MD  losartan (COZAAR) 50 MG tablet Take 1 tablet (50 mg total) by mouth daily. 06/20/22   Plotnikov, Georgina Quint, MD  memantine (NAMENDA) 10 MG tablet Take 1 tablet (10 mg total) by mouth 2 (two) times daily. Follow-up appt due in August must see provider for future refills 05/01/22   Drema Dallas, DO  Multiple Vitamin (MULTIVITAMIN) tablet Take 1 tablet by mouth daily.    [provider]  omeprazole (PRILOSEC) 40 MG capsule TAKE 1 CAPSULE DAILY 07/27/22   Plotnikov, Georgina Quint, MD  ondansetron (ZOFRAN) 4 MG tablet Take 1 tablet (4 mg total) by mouth every 6 (six) hours as needed. Take  1 tab every 6 hours as needed for nausea and vomiting. 11/03/20   Plotnikov, Georgina Quint, MD  simvastatin (ZOCOR) 40 MG tablet Take 1 tablet (40 mg total) by mouth daily at 6 PM. 06/20/22   Plotnikov, Georgina Quint, MD  SYNTHROID 50 MCG tablet Take 1 tablet (50 mcg total) by mouth daily before breakfast. 07/14/22   Plotnikov, Georgina Quint, MD  umeclidinium bromide (INCRUSE ELLIPTA) 62.5 MCG/ACT AEPB Inhale 1 puff into the lungs daily. 06/29/22   Plotnikov, Georgina Quint, MD      Allergies    Codeine sulfate, Exelon [rivastigmine tartrate], and Oxycodone-acetaminophen  Review of Systems   Review of Systems  Physical Exam Updated Vital Signs BP 90/74 (BP Location: Right Arm)   Pulse 88   Temp 98.3 F (36.8 C) (Axillary)   Resp 18   SpO2 96%  Physical Exam Vitals and nursing note reviewed.  Constitutional:      General: She is not in acute distress.    Appearance: Normal appearance.  HENT:     Head: Normocephalic and atraumatic.     Comments: Contusion above left eyebrow    Nose: Nose normal.     Mouth/Throat:     Mouth: Mucous membranes are dry.     Pharynx: Oropharynx is clear.  Eyes:     Extraocular Movements: Extraocular movements intact.     Conjunctiva/sclera: Conjunctivae normal.     Pupils: Pupils are equal, round, and reactive to light.   Neck:     Comments: C-collar in place, no midline neck tenderness Cardiovascular:     Rate and Rhythm: Normal rate and regular rhythm.     Heart sounds: Normal heart sounds.  Pulmonary:     Effort: Pulmonary effort is normal.     Breath sounds: Normal breath sounds.  Abdominal:     General: Abdomen is flat.     Palpations: Abdomen is soft.     Tenderness: There is no abdominal tenderness.  Musculoskeletal:        General: Normal range of motion.  Skin:    General: Skin is warm and dry.  Neurological:     General: No focal deficit present.     Mental Status: She is alert. Mental status is at baseline.     Cranial Nerves: No cranial nerve deficit.     Sensory: No sensory deficit.     Motor: No weakness.     Comments: Oriented to person only  Psychiatric:        Mood and Affect: Mood normal.        Behavior: Behavior normal.     ED Results / Procedures / Treatments   Labs (all labs ordered are listed, but only abnormal results are displayed) Labs Reviewed  CBC WITH DIFFERENTIAL/PLATELET - Abnormal; Notable for the following components:      Result Value   WBC 11.1 (*)    RBC 5.42 (*)    Hemoglobin 16.2 (*)    HCT 48.5 (*)    Platelets 131 (*)    Neutro Abs 8.7 (*)    Abs Immature Granulocytes 0.08 (*)    All other components within normal limits  CK - Abnormal; Notable for the following components:   Total CK 725 (*)    All other components within normal limits  COMPREHENSIVE METABOLIC PANEL - Abnormal; Notable for the following components:   Potassium 3.0 (*)    Glucose, Bld 168 (*)    Creatinine, Ser 1.09 (*)    Calcium 8.1 (*)    Total Protein 6.1 (*)    Albumin 3.4 (*)    AST 53 (*)    Total Bilirubin 1.9 (*)    GFR, Estimated 50 (*)    All other components within normal limits  LACTIC ACID, PLASMA  URINALYSIS, W/ REFLEX TO CULTURE (INFECTION SUSPECTED)  PROTIME-INR  COOXEMETRY PANEL  MAGNESIUM    EKG EKG Interpretation  Date/Time:  Friday Mar 02 2023 20:18:03 EDT Ventricular Rate:  96 PR Interval:  192 QRS Duration: 90 QT Interval:  369 QTC Calculation: 467 R Axis:   84 Text Interpretation: Sinus rhythm  Borderline right axis deviation Borderline repolarization abnormality No significant change since last tracing Confirmed by Elayne Snare ([id number]) on 03/02/2023 8:29:48 PM  Radiology DG Chest Port 1 View  Result Date: 03/02/2023 CLINICAL DATA:  Fall EXAM: PORTABLE CHEST 1 VIEW COMPARISON:  05/01/2022 FINDINGS: Hyperinflation with emphysema and bronchitic changes. No acute airspace disease, pleural effusion or pneumothorax. Stable cardiomediastinal silhouette with aortic atherosclerosis. IMPRESSION: No active disease. Hyperinflation with emphysema and bronchitic changes. Electronically Signed   By: Jasmine Pang M.D.   On: 03/02/2023 22:21   DG Pelvis Portable  Result Date: 03/02/2023 CLINICAL DATA:  Fall EXAM: PORTABLE PELVIS 1-2 VIEWS COMPARISON:  CT 02/20/2014 FINDINGS: There is no evidence of pelvic fracture or diastasis. No pelvic bone lesions are seen. IMPRESSION: Negative. Electronically Signed   By: Jasmine Pang M.D.   On: 03/02/2023 22:20   CT Head Wo Contrast  Result Date: 03/02/2023 CLINICAL DATA:  Neck trauma fall EXAM: CT HEAD WITHOUT CONTRAST CT CERVICAL SPINE WITHOUT CONTRAST TECHNIQUE: Multidetector CT imaging of the head and cervical spine was performed following the standard protocol without intravenous contrast. Multiplanar CT image reconstructions of the cervical spine were also generated. RADIATION DOSE REDUCTION: This exam was performed according to the departmental dose-optimization program which includes automated exposure control, adjustment of the mA and/or kV according to patient size and/or use of iterative reconstruction technique. COMPARISON:  None Available. FINDINGS: CT HEAD FINDINGS Brain: No acute territorial infarction, hemorrhage or intracranial mass. Moderate patchy white matter hypodensity likely  chronic small vessel ischemic change. Atrophy. Nonenlarged ventricles Vascular: No hyperdense vessels.  Carotid vascular calcification Skull: Normal. Negative for fracture or focal lesion. Sinuses/Orbits: No acute finding. Other: None CT CERVICAL SPINE FINDINGS Alignment: No subluxation.  Facet alignment is within normal limits. Skull base and vertebrae: No acute fracture. No primary bone lesion or focal pathologic process. Soft tissues and spinal canal: No prevertebral fluid or swelling. No visible canal hematoma. Disc levels: Anterior fusion hardware C5-C6. Advanced degenerative changes at C3-C4 and C4-C5. Facet degenerative changes at multiple levels with foraminal narrowing, worst at C3-C4 and C4-C5. Upper chest: Negative. Other: None IMPRESSION: No CT evidence for acute intracranial abnormality. Atrophy and chronic small vessel ischemic changes of the white matter. Degenerative changes of the cervical spine with anterior fusion hardware at C5-C6. No acute osseous abnormality. Electronically Signed   By: Jasmine Pang M.D.   On: 03/02/2023 21:50   CT Cervical Spine Wo Contrast  Result Date: 03/02/2023 CLINICAL DATA:  Neck trauma fall EXAM: CT HEAD WITHOUT CONTRAST CT CERVICAL SPINE WITHOUT CONTRAST TECHNIQUE: Multidetector CT imaging of the head and cervical spine was performed following the standard protocol without intravenous contrast. Multiplanar CT image reconstructions of the cervical spine were also generated. RADIATION DOSE REDUCTION: This exam was performed according to the departmental dose-optimization program which includes automated exposure control, adjustment of the mA and/or kV according to patient size and/or use of iterative reconstruction technique. COMPARISON:  None Available. FINDINGS: CT HEAD FINDINGS Brain: No acute territorial infarction, hemorrhage or intracranial mass. Moderate patchy white matter hypodensity likely chronic small vessel ischemic change. Atrophy. Nonenlarged  ventricles Vascular: No hyperdense vessels.  Carotid vascular calcification Skull: Normal. Negative for fracture or focal lesion. Sinuses/Orbits: No acute finding. Other: None CT CERVICAL SPINE FINDINGS Alignment: No subluxation.  Facet alignment is within normal limits. Skull base and vertebrae: No acute fracture. No primary bone lesion or focal pathologic process. Soft tissues and spinal canal: No prevertebral fluid or swelling. No visible canal hematoma.  Disc levels: Anterior fusion hardware C5-C6. Advanced degenerative changes at C3-C4 and C4-C5. Facet degenerative changes at multiple levels with foraminal narrowing, worst at C3-C4 and C4-C5. Upper chest: Negative. Other: None IMPRESSION: No CT evidence for acute intracranial abnormality. Atrophy and chronic small vessel ischemic changes of the white matter. Degenerative changes of the cervical spine with anterior fusion hardware at C5-C6. No acute osseous abnormality. Electronically Signed   By: Jasmine Pang M.D.   On: 03/02/2023 21:50    Procedures Procedures    Medications Ordered in ED Medications  lactated ringers bolus 1,000 mL (has no administration in time range)  potassium chloride SA (KLOR-CON M) CR tablet 40 mEq (has no administration in time range)  potassium chloride SA (KLOR-CON M) CR tablet 40 mEq (has no administration in time range)  acetaminophen (TYLENOL) tablet 650 mg (has no administration in time range)  aspirin EC tablet 81 mg (has no administration in time range)  Vitamin D3 2,000 Units (has no administration in time range)  donepezil (ARICEPT) tablet 10 mg (has no administration in time range)  losartan (COZAAR) tablet 50 mg (has no administration in time range)  memantine (NAMENDA) tablet 10 mg (has no administration in time range)  multivitamin tablet 1 tablet (has no administration in time range)  simvastatin (ZOCOR) tablet 40 mg (has no administration in time range)  levothyroxine (SYNTHROID) tablet 50 mcg (has no  administration in time range)  umeclidinium bromide (INCRUSE ELLIPTA) 62.5 MCG/ACT 1 puff (has no administration in time range)    ED Course/ Medical Decision Making/ A&P Clinical Course as of 03/02/23 2333  Fri Mar 02, 2023  2215 No acute traumatic injuries on CT. [VK]  2301 C-collar cleared by myself. Family at bedside report if her husband is admitted they will not be able to stay with her in [place]. [VK]  2316 Labs without evidence of rhabdo or severe dehydration. Mild hypokalemia which will be repleted.  [VK]  2333 Patient signed out to Dr. Clayborne Dana pending co=ox and SW evaluation for disposition. [VK]    Clinical Course User Index [VK] Rexford Maus, DO                             Medical Decision Making This patient presents to the ED with chief complaint(s) of fall, weakness with pertinent past medical history of dementia, COPD, HTN which further complicates the presenting complaint. The complaint involves an extensive differential diagnosis and also carries with it a high risk of complications and morbidity.    The differential diagnosis includes ICH, mass effect, cervical spine injury, dehydration, electrolyte abnormality, rhabdo, considering carbon monoxide poisoning with both her husband and her altered and fallen  Additional history obtained: Additional history obtained from family and EMS  Records reviewed Primary Care Documents  ED Course and Reassessment: On patient's arrival to the emergency department EMS initially reported that she was hypoxic however she was satting well on room air.  The patient was pleasantly demented and appeared to be her baseline mental status.  She does have contusion to her forehead concerning for trauma and will have CT head and C-spine performed in addition to chest and pelvis x-ray.  No other traumatic injuries seen on exam.  She will be started on IV fluids and will have labs and EKG performed to further evaluate for cause of her  fall.  Independent labs interpretation:  The following labs were independently interpreted: Mild leukocytosis, hypokalemia  Independent visualization of imaging: - I independently visualized the following imaging with scope of interpretation limited to determining acute life threatening conditions related to emergency care: CT head/C-spine, chest x-ray, pelvis x-ray, which revealed no acute traumatic injury  Consultation: - Consulted or discussed management/test interpretation w/ external professional: Social work      Amount and/or Complexity of Data Reviewed Labs: ordered. Radiology: ordered.  Risk OTC drugs. Prescription drug management.          Final Clinical Impression(s) / ED Diagnoses Final diagnoses:  None    Rx / DC Orders ED Discharge Orders     None         Rexford Maus, DO 03/02/23 2334

## 2023-03-02 NOTE — ED Notes (Signed)
Personal hygiene preformed.  Bed bath completed.  Redness noted to groin.  No skin breakdown noted at this time.  New sheets and gown provided.  Warm blankets placed on patient.  Family now at bedside

## 2023-03-02 NOTE — ED Triage Notes (Signed)
Patient BIB EMS for evaluation after well-check performed.  Per report, family was unable to get a hold of patient and husband.  Called and had well-check preformed by PD.  No answer and officers had to break into house.  Officers found patient and husband on the floor for unknown length of time.  Neighbor last saw patient one week ago.  EMS reports patient was in the same clothing from one week prior.  Evidence in house that patient and spouse had been attempting to get themselves up without success.  CBG 138, SPO2 70% on room air and improved with oxygen.  Hematoma noted to R eye.  Was found on R side.  Pt unable to recall events or how long she was on floor. Pt alert on arrival.

## 2023-03-03 DIAGNOSIS — F02C0 Dementia in other diseases classified elsewhere, severe, without behavioral disturbance, psychotic disturbance, mood disturbance, and anxiety: Secondary | ICD-10-CM | POA: Diagnosis present

## 2023-03-03 DIAGNOSIS — I10 Essential (primary) hypertension: Secondary | ICD-10-CM | POA: Diagnosis present

## 2023-03-03 DIAGNOSIS — M6281 Muscle weakness (generalized): Secondary | ICD-10-CM | POA: Diagnosis not present

## 2023-03-03 DIAGNOSIS — Z1611 Resistance to penicillins: Secondary | ICD-10-CM | POA: Diagnosis present

## 2023-03-03 DIAGNOSIS — E876 Hypokalemia: Secondary | ICD-10-CM | POA: Diagnosis present

## 2023-03-03 DIAGNOSIS — Z79890 Hormone replacement therapy (postmenopausal): Secondary | ICD-10-CM | POA: Diagnosis not present

## 2023-03-03 DIAGNOSIS — I739 Peripheral vascular disease, unspecified: Secondary | ICD-10-CM | POA: Diagnosis present

## 2023-03-03 DIAGNOSIS — D696 Thrombocytopenia, unspecified: Secondary | ICD-10-CM | POA: Diagnosis present

## 2023-03-03 DIAGNOSIS — N390 Urinary tract infection, site not specified: Secondary | ICD-10-CM | POA: Diagnosis present

## 2023-03-03 DIAGNOSIS — E8809 Other disorders of plasma-protein metabolism, not elsewhere classified: Secondary | ICD-10-CM | POA: Diagnosis present

## 2023-03-03 DIAGNOSIS — Z9181 History of falling: Secondary | ICD-10-CM | POA: Diagnosis not present

## 2023-03-03 DIAGNOSIS — D751 Secondary polycythemia: Secondary | ICD-10-CM | POA: Diagnosis present

## 2023-03-03 DIAGNOSIS — F17210 Nicotine dependence, cigarettes, uncomplicated: Secondary | ICD-10-CM | POA: Diagnosis present

## 2023-03-03 DIAGNOSIS — S0083XA Contusion of other part of head, initial encounter: Secondary | ICD-10-CM | POA: Diagnosis present

## 2023-03-03 DIAGNOSIS — M8589 Other specified disorders of bone density and structure, multiple sites: Secondary | ICD-10-CM | POA: Diagnosis not present

## 2023-03-03 DIAGNOSIS — N3001 Acute cystitis with hematuria: Secondary | ICD-10-CM | POA: Diagnosis not present

## 2023-03-03 DIAGNOSIS — Z79899 Other long term (current) drug therapy: Secondary | ICD-10-CM | POA: Diagnosis not present

## 2023-03-03 DIAGNOSIS — J9601 Acute respiratory failure with hypoxia: Secondary | ICD-10-CM | POA: Diagnosis present

## 2023-03-03 DIAGNOSIS — D72829 Elevated white blood cell count, unspecified: Secondary | ICD-10-CM | POA: Diagnosis not present

## 2023-03-03 DIAGNOSIS — R1312 Dysphagia, oropharyngeal phase: Secondary | ICD-10-CM | POA: Diagnosis not present

## 2023-03-03 DIAGNOSIS — R2681 Unsteadiness on feet: Secondary | ICD-10-CM | POA: Diagnosis not present

## 2023-03-03 DIAGNOSIS — R531 Weakness: Secondary | ICD-10-CM | POA: Diagnosis present

## 2023-03-03 DIAGNOSIS — R319 Hematuria, unspecified: Secondary | ICD-10-CM | POA: Diagnosis present

## 2023-03-03 DIAGNOSIS — G309 Alzheimer's disease, unspecified: Secondary | ICD-10-CM | POA: Diagnosis present

## 2023-03-03 DIAGNOSIS — W19XXXA Unspecified fall, initial encounter: Secondary | ICD-10-CM

## 2023-03-03 DIAGNOSIS — F17200 Nicotine dependence, unspecified, uncomplicated: Secondary | ICD-10-CM | POA: Diagnosis not present

## 2023-03-03 DIAGNOSIS — Y92009 Unspecified place in unspecified non-institutional (private) residence as the place of occurrence of the external cause: Secondary | ICD-10-CM | POA: Diagnosis not present

## 2023-03-03 DIAGNOSIS — B961 Klebsiella pneumoniae [K. pneumoniae] as the cause of diseases classified elsewhere: Secondary | ICD-10-CM | POA: Diagnosis present

## 2023-03-03 DIAGNOSIS — N289 Disorder of kidney and ureter, unspecified: Secondary | ICD-10-CM | POA: Diagnosis not present

## 2023-03-03 DIAGNOSIS — G301 Alzheimer's disease with late onset: Secondary | ICD-10-CM | POA: Diagnosis not present

## 2023-03-03 DIAGNOSIS — E034 Atrophy of thyroid (acquired): Secondary | ICD-10-CM | POA: Diagnosis not present

## 2023-03-03 DIAGNOSIS — E785 Hyperlipidemia, unspecified: Secondary | ICD-10-CM | POA: Diagnosis present

## 2023-03-03 DIAGNOSIS — R000 Tachycardia, unspecified: Secondary | ICD-10-CM | POA: Diagnosis present

## 2023-03-03 DIAGNOSIS — K219 Gastro-esophageal reflux disease without esophagitis: Secondary | ICD-10-CM | POA: Diagnosis present

## 2023-03-03 DIAGNOSIS — E039 Hypothyroidism, unspecified: Secondary | ICD-10-CM | POA: Diagnosis present

## 2023-03-03 DIAGNOSIS — J441 Chronic obstructive pulmonary disease with (acute) exacerbation: Secondary | ICD-10-CM | POA: Diagnosis present

## 2023-03-03 DIAGNOSIS — R2689 Other abnormalities of gait and mobility: Secondary | ICD-10-CM | POA: Diagnosis not present

## 2023-03-03 DIAGNOSIS — E1165 Type 2 diabetes mellitus with hyperglycemia: Secondary | ICD-10-CM | POA: Diagnosis not present

## 2023-03-03 DIAGNOSIS — J439 Emphysema, unspecified: Secondary | ICD-10-CM | POA: Diagnosis present

## 2023-03-03 LAB — URINALYSIS, W/ REFLEX TO CULTURE (INFECTION SUSPECTED)
Bilirubin Urine: NEGATIVE
Glucose, UA: NEGATIVE mg/dL
Hgb urine dipstick: NEGATIVE
Ketones, ur: 20 mg/dL — AB
Nitrite: POSITIVE — AB
Protein, ur: 100 mg/dL — AB
Specific Gravity, Urine: 1.023 (ref 1.005–1.030)
WBC, UA: >50 WBC/hpf (ref 0–5)
pH: 5 (ref 5.0–8.0)

## 2023-03-03 LAB — BASIC METABOLIC PANEL
Anion gap: 8 (ref 5–15)
BUN: 18 mg/dL (ref 8–23)
CO2: 25 mmol/L (ref 22–32)
Calcium: 8.7 mg/dL — ABNORMAL LOW (ref 8.9–10.3)
Chloride: 105 mmol/L (ref 98–111)
Creatinine, Ser: 0.83 mg/dL (ref 0.44–1.00)
GFR, Estimated: >60 mL/min (ref >60)
Glucose, Bld: 195 mg/dL — ABNORMAL HIGH (ref 70–99)
Potassium: 3 mmol/L — ABNORMAL LOW (ref 3.5–5.1)
Sodium: 138 mmol/L (ref 135–145)

## 2023-03-03 LAB — MAGNESIUM: Magnesium: 1.9 mg/dL (ref 1.7–2.4)

## 2023-03-03 LAB — TSH: TSH: 2.616 u[IU]/mL (ref 0.350–4.500)

## 2023-03-03 LAB — PROTIME-INR
INR: 1.2 (ref 0.8–1.2)
Prothrombin Time: 15 seconds (ref 11.4–15.2)

## 2023-03-03 MED ORDER — SODIUM CHLORIDE 0.9 % IV SOLN
2.0000 g | INTRAVENOUS | Status: AC
  Administered 2023-03-04 – 2023-03-07 (×4): 2 g via INTRAVENOUS
  Filled 2023-03-03 (×4): qty 20

## 2023-03-03 MED ORDER — ACETAMINOPHEN 325 MG PO TABS
650.0000 mg | ORAL_TABLET | Freq: Four times a day (QID) | ORAL | Status: DC | PRN
  Administered 2023-03-03 – 2023-03-06 (×3): 650 mg via ORAL
  Filled 2023-03-03 (×3): qty 2

## 2023-03-03 MED ORDER — PREDNISONE 20 MG PO TABS
40.0000 mg | ORAL_TABLET | Freq: Every day | ORAL | Status: DC
  Administered 2023-03-04 – 2023-03-07 (×4): 40 mg via ORAL
  Filled 2023-03-03 (×4): qty 2

## 2023-03-03 MED ORDER — ONDANSETRON HCL 4 MG/2ML IJ SOLN
4.0000 mg | Freq: Four times a day (QID) | INTRAMUSCULAR | Status: DC | PRN
  Administered 2023-03-04: 4 mg via INTRAVENOUS
  Filled 2023-03-03: qty 2

## 2023-03-03 MED ORDER — CALCIUM GLUCONATE-NACL 2-0.675 GM/100ML-% IV SOLN
2.0000 g | Freq: Once | INTRAVENOUS | Status: AC
  Administered 2023-03-03: 2000 mg via INTRAVENOUS
  Filled 2023-03-03: qty 100

## 2023-03-03 MED ORDER — NICOTINE 21 MG/24HR TD PT24
21.0000 mg | MEDICATED_PATCH | Freq: Every day | TRANSDERMAL | Status: DC
  Administered 2023-03-03 – 2023-03-07 (×5): 21 mg via TRANSDERMAL
  Filled 2023-03-03 (×5): qty 1

## 2023-03-03 MED ORDER — IPRATROPIUM-ALBUTEROL 0.5-2.5 (3) MG/3ML IN SOLN
3.0000 mL | Freq: Four times a day (QID) | RESPIRATORY_TRACT | Status: DC
  Administered 2023-03-03: 3 mL via RESPIRATORY_TRACT
  Filled 2023-03-03: qty 3

## 2023-03-03 MED ORDER — ACETAMINOPHEN 650 MG RE SUPP: 650.0000 mg | Freq: Four times a day (QID) | RECTAL | Status: DC | PRN

## 2023-03-03 MED ORDER — SODIUM CHLORIDE 0.9% FLUSH
3.0000 mL | Freq: Two times a day (BID) | INTRAVENOUS | Status: DC
  Administered 2023-03-03 – 2023-03-07 (×8): 3 mL via INTRAVENOUS

## 2023-03-03 MED ORDER — FUROSEMIDE 40 MG PO TABS
40.0000 mg | ORAL_TABLET | Freq: Every day | ORAL | Status: DC
  Administered 2023-03-03 – 2023-03-04 (×2): 40 mg via ORAL
  Filled 2023-03-03 (×2): qty 1

## 2023-03-03 MED ORDER — SODIUM CHLORIDE 0.9 % IV BOLUS
1000.0000 mL | Freq: Once | INTRAVENOUS | Status: AC
  Administered 2023-03-03: 1000 mL via INTRAVENOUS

## 2023-03-03 MED ORDER — ALBUTEROL SULFATE (2.5 MG/3ML) 0.083% IN NEBU: 2.5000 mg | INHALATION_SOLUTION | RESPIRATORY_TRACT | Status: DC | PRN

## 2023-03-03 MED ORDER — ONDANSETRON HCL 4 MG PO TABS: 4.0000 mg | ORAL_TABLET | Freq: Four times a day (QID) | ORAL | Status: DC | PRN

## 2023-03-03 MED ORDER — METHYLPREDNISOLONE SODIUM SUCC 125 MG IJ SOLR
125.0000 mg | Freq: Once | INTRAMUSCULAR | Status: AC
  Administered 2023-03-03: 125 mg via INTRAVENOUS
  Filled 2023-03-03: qty 2

## 2023-03-03 MED ORDER — ENOXAPARIN SODIUM 40 MG/0.4ML IJ SOSY
40.0000 mg | PREFILLED_SYRINGE | INTRAMUSCULAR | Status: DC
  Administered 2023-03-03 – 2023-03-06 (×4): 40 mg via SUBCUTANEOUS
  Filled 2023-03-03 (×4): qty 0.4

## 2023-03-03 MED ORDER — PANTOPRAZOLE SODIUM 40 MG PO TBEC
40.0000 mg | DELAYED_RELEASE_TABLET | Freq: Every day | ORAL | Status: DC
  Administered 2023-03-03 – 2023-03-07 (×5): 40 mg via ORAL
  Filled 2023-03-03 (×5): qty 1

## 2023-03-03 MED ORDER — SODIUM CHLORIDE 0.9 % IV SOLN
2.0000 g | Freq: Once | INTRAVENOUS | Status: AC
  Administered 2023-03-03: 2 g via INTRAVENOUS
  Filled 2023-03-03: qty 20

## 2023-03-03 NOTE — ED Notes (Signed)
ED TO INPATIENT HANDOFF REPORT  ED Nurse Name and Phone #: Dot Lanes Paramedic  S Name/Age/Gender [name] 87 y.o. female Room/Bed: 004C/004C  Code Status   Code Status: Full Code  Home/SNF/Other Home Patient oriented to: self Is this baseline? Yes   Triage Complete: Triage complete  Chief Complaint Fall at home, initial encounter [W19.XXXA, Y92.009] Sepsis secondary to UTI (HCC) [A41.9, N39.0]  Triage Note Patient BIB EMS for evaluation after well-check performed.  Per report, family was unable to get a hold of patient and husband.  Called and had well-check preformed by PD.  No answer and officers had to break into house.  Officers found patient and husband on the floor for unknown length of time.  Neighbor last saw patient one week ago.  EMS reports patient was in the same clothing from one week prior.  Evidence in house that patient and spouse had been attempting to get themselves up without success.  CBG 138, SPO2 70% on room air and improved with oxygen.  Hematoma noted to R eye.  Was found on R side.  Pt unable to recall events or how long she was on floor. Pt alert on arrival.   Allergies Allergies  Allergen Reactions   Codeine Sulfate Other (See Comments)    Extreme headaches   Exelon [Rivastigmine Tartrate]     nausea   Oxycodone-Acetaminophen Other (See Comments)    Extreme headaches    Level of Care/Admitting Diagnosis ED Disposition     ED Disposition  Admit   Condition  --   Comment  Hospital Area: [facility] [[id number]]  Level of Care: Telemetry Medical [[id number]]  May admit patient to [facility] or [facility] if equivalent level of care is available:: No  Covid Evaluation: Asymptomatic - no recent exposure (last 10 days) testing not required  Diagnosis: Sepsis secondary to UTI [facility]) [[id number]]  Admitting Physician: Clydie Braun [[id number]]  Attending Physician: Clydie Braun [[id number]]  Certification:: I certify this patient  will need inpatient services for at least 2 midnights          B Medical/Surgery History Past Medical History:  Diagnosis Date   Anxiety    COPD (chronic obstructive pulmonary disease) (HCC)    Dementia (HCC)    Diverticulosis of colon (without mention of hemorrhage)    Gallstones    GERD (gastroesophageal reflux disease)    Hiatal hernia    Hyperlipidemia    Hypertension    Hypothyroidism    Low back pain    OA R radiculopathy   Lower extremity edema    chronic   Osteopenia    Peptic ulcer    Peripheral vascular disease (HCC)    Personal history of colonic polyps 2001   TUBULAR ADENOMA - [place]   Pneumonia    Past Surgical History:  Procedure Laterality Date   ABDOMINAL ADHESION SURGERY     ABDOMINAL HYSTERECTOMY     APPENDECTOMY     CARPAL TUNNEL RELEASE Bilateral    CATARACT EXTRACTION W/ INTRAOCULAR LENS  IMPLANT, BILATERAL     CHOLECYSTECTOMY     NECK SURGERY     bone and plate    ROTATOR CUFF REPAIR Bilateral    TUBAL LIGATION       A IV Location/Drains/Wounds Patient Lines/Drains/Airways Status     Active Line/Drains/Airways     Name Placement date Placement time Site Days   Peripheral IV 03/02/23 18 G Right Antecubital 03/02/23  0000  Antecubital  1   Peripheral IV 03/02/23 18 G Anterior;Distal;Left;Upper Arm 03/02/23  0000  Arm  1            Intake/Output Last 24 hours  Intake/Output Summary (Last 24 hours) at 03/03/2023 0915 Last data filed at 03/03/2023 0618 Gross per 24 hour  Intake 1100 ml  Output --  Net 1100 ml    Labs/Imaging Results for orders placed or performed during the hospital encounter of 03/02/23 (from the past 48 hour(s))  CBC with Differential     Status: Abnormal   Collection Time: 03/02/23  8:23 PM  Result Value Ref Range   WBC 11.1 (H) 4.0 - 10.5 K/uL   RBC 5.42 (H) 3.87 - 5.11 MIL/uL   Hemoglobin 16.2 (H) 12.0 - 15.0 g/dL   HCT 16.1 (H) 09.6 - 04.5 %   MCV 89.5 80.0 - 100.0 fL   MCH 29.9 26.0 - 34.0 pg    MCHC 33.4 30.0 - 36.0 g/dL   RDW 40.9 81.1 - 91.4 %   Platelets 131 (L) 150 - 400 K/uL   nRBC 0.0 0.0 - 0.2 %   Neutrophils Relative % 79 %   Neutro Abs 8.7 (H) 1.7 - 7.7 K/uL   Lymphocytes Relative 14 %   Lymphs Abs 1.6 0.7 - 4.0 K/uL   Monocytes Relative 6 %   Monocytes Absolute 0.7 0.1 - 1.0 K/uL   Eosinophils Relative 0 %   Eosinophils Absolute 0.0 0.0 - 0.5 K/uL   Basophils Relative 0 %   Basophils Absolute 0.0 0.0 - 0.1 K/uL   Immature Granulocytes 1 %   Abs Immature Granulocytes 0.08 (H) 0.00 - 0.07 K/uL    Comment: Performed at [facility], [address], [place], [place] [address]  Lactic acid, plasma     Status: None   Collection Time: 03/02/23  8:23 PM  Result Value Ref Range   Lactic Acid, Venous 1.8 0.5 - 1.9 mmol/L    Comment: Performed at [facility], [address] [place], [place] [address]  CK     Status: Abnormal   Collection Time: 03/02/23 10:25 PM  Result Value Ref Range   Total CK 725 (H) 38 - 234 U/L    Comment: Performed at [facility], [address] [place], [place] [address]  Comprehensive metabolic panel     Status: Abnormal   Collection Time: 03/02/23 10:25 PM  Result Value Ref Range   Sodium 142 135 - 145 mmol/L   Potassium 3.0 (L) 3.5 - 5.1 mmol/L   Chloride 104 98 - 111 mmol/L   CO2 25 22 - 32 mmol/L   Glucose, Bld 168 (H) 70 - 99 mg/dL    Comment: Glucose reference range applies only to samples taken after fasting for at least 8 hours.   BUN 20 8 - 23 mg/dL   Creatinine, Ser 4.69 (H) 0.44 - 1.00 mg/dL   Calcium 8.1 (L) 8.9 - 10.3 mg/dL   Total Protein 6.1 (L) 6.5 - 8.1 g/dL   Albumin 3.4 (L) 3.5 - 5.0 g/dL   AST 53 (H) 15 - 41 U/L   ALT 36 0 - 44 U/L   Alkaline Phosphatase 57 38 - 126 U/L   Total Bilirubin 1.9 (H) 0.3 - 1.2 mg/dL   GFR, Estimated 50 (L) >60 mL/min    Comment: (NOTE) Calculated using the CKD-EPI Creatinine Equation (2021)    Anion gap 13 5 - 15    Comment: Performed  at [facility],  [address] [place], [place] [address]  Protime-INR     Status: None   Collection Time: 03/02/23 11:36 PM  Result Value Ref Range   Prothrombin Time 15.0 11.4 - 15.2 seconds   INR 1.2 0.8 - 1.2    Comment: (NOTE) INR goal varies based on device and disease states. Performed at [facility], [address] [place], [place] [address]   Magnesium     Status: None   Collection Time: 03/02/23 11:36 PM  Result Value Ref Range   Magnesium 1.9 1.7 - 2.4 mg/dL    Comment: Performed at [facility], [address] [place], [place] [address]  Urinalysis, w/ Reflex to Culture (Infection Suspected) -Urine, Clean Catch     Status: Abnormal   Collection Time: 03/03/23  3:40 AM  Result Value Ref Range   Specimen Source URINE, CLEAN CATCH    Color, Urine AMBER (A) YELLOW    Comment: BIOCHEMICALS MAY BE AFFECTED BY COLOR   APPearance CLOUDY (A) CLEAR   Specific Gravity, Urine 1.023 1.005 - 1.030   pH 5.0 5.0 - 8.0   Glucose, UA NEGATIVE NEGATIVE mg/dL   Hgb urine dipstick NEGATIVE NEGATIVE   Bilirubin Urine NEGATIVE NEGATIVE   Ketones, ur 20 (A) NEGATIVE mg/dL   Protein, ur 213 (A) NEGATIVE mg/dL   Nitrite POSITIVE (A) NEGATIVE   Leukocytes,Ua LARGE (A) NEGATIVE   RBC / HPF 11-20 0 - 5 RBC/hpf   WBC, UA >50 0 - 5 WBC/hpf    Comment:        Reflex urine culture not performed if WBC <=10, OR if Squamous epithelial cells >5. If Squamous epithelial cells >5 suggest recollection.    Bacteria, UA MANY (A) NONE SEEN   Squamous Epithelial / HPF 0-5 0 - 5 /HPF   WBC Clumps PRESENT     Comment: Performed at [facility], [address] [place], [place] [address]   DG Chest Port 1 View  Result Date: 03/02/2023 CLINICAL DATA:  Fall EXAM: PORTABLE CHEST 1 VIEW COMPARISON:  05/01/2022 FINDINGS: Hyperinflation with emphysema and bronchitic changes. No acute airspace disease, pleural effusion or pneumothorax. Stable cardiomediastinal silhouette with aortic atherosclerosis.  IMPRESSION: No active disease. Hyperinflation with emphysema and bronchitic changes. Electronically Signed   By: Jasmine Pang M.D.   On: 03/02/2023 22:21   DG Pelvis Portable  Result Date: 03/02/2023 CLINICAL DATA:  Fall EXAM: PORTABLE PELVIS 1-2 VIEWS COMPARISON:  CT 02/20/2014 FINDINGS: There is no evidence of pelvic fracture or diastasis. No pelvic bone lesions are seen. IMPRESSION: Negative. Electronically Signed   By: Jasmine Pang M.D.   On: 03/02/2023 22:20   CT Head Wo Contrast  Result Date: 03/02/2023 CLINICAL DATA:  Neck trauma fall EXAM: CT HEAD WITHOUT CONTRAST CT CERVICAL SPINE WITHOUT CONTRAST TECHNIQUE: Multidetector CT imaging of the head and cervical spine was performed following the standard protocol without intravenous contrast. Multiplanar CT image reconstructions of the cervical spine were also generated. RADIATION DOSE REDUCTION: This exam was performed according to the departmental dose-optimization program which includes automated exposure control, adjustment of the mA and/or kV according to patient size and/or use of iterative reconstruction technique. COMPARISON:  None Available. FINDINGS: CT HEAD FINDINGS Brain: No acute territorial infarction, hemorrhage or intracranial mass. Moderate patchy white matter hypodensity likely chronic small vessel ischemic change. Atrophy. Nonenlarged ventricles Vascular: No hyperdense vessels.  Carotid vascular calcification Skull: Normal. Negative for fracture or focal  lesion. Sinuses/Orbits: No acute finding. Other: None CT CERVICAL SPINE FINDINGS Alignment: No subluxation.  Facet alignment is within normal limits. Skull base and vertebrae: No acute fracture. No primary bone lesion or focal pathologic process. Soft tissues and spinal canal: No prevertebral fluid or swelling. No visible canal hematoma. Disc levels: Anterior fusion hardware C5-C6. Advanced degenerative changes at C3-C4 and C4-C5. Facet degenerative changes at multiple levels with  foraminal narrowing, worst at C3-C4 and C4-C5. Upper chest: Negative. Other: None IMPRESSION: No CT evidence for acute intracranial abnormality. Atrophy and chronic small vessel ischemic changes of the white matter. Degenerative changes of the cervical spine with anterior fusion hardware at C5-C6. No acute osseous abnormality. Electronically Signed   By: Jasmine Pang M.D.   On: 03/02/2023 21:50   CT Cervical Spine Wo Contrast  Result Date: 03/02/2023 CLINICAL DATA:  Neck trauma fall EXAM: CT HEAD WITHOUT CONTRAST CT CERVICAL SPINE WITHOUT CONTRAST TECHNIQUE: Multidetector CT imaging of the head and cervical spine was performed following the standard protocol without intravenous contrast. Multiplanar CT image reconstructions of the cervical spine were also generated. RADIATION DOSE REDUCTION: This exam was performed according to the departmental dose-optimization program which includes automated exposure control, adjustment of the mA and/or kV according to patient size and/or use of iterative reconstruction technique. COMPARISON:  None Available. FINDINGS: CT HEAD FINDINGS Brain: No acute territorial infarction, hemorrhage or intracranial mass. Moderate patchy white matter hypodensity likely chronic small vessel ischemic change. Atrophy. Nonenlarged ventricles Vascular: No hyperdense vessels.  Carotid vascular calcification Skull: Normal. Negative for fracture or focal lesion. Sinuses/Orbits: No acute finding. Other: None CT CERVICAL SPINE FINDINGS Alignment: No subluxation.  Facet alignment is within normal limits. Skull base and vertebrae: No acute fracture. No primary bone lesion or focal pathologic process. Soft tissues and spinal canal: No prevertebral fluid or swelling. No visible canal hematoma. Disc levels: Anterior fusion hardware C5-C6. Advanced degenerative changes at C3-C4 and C4-C5. Facet degenerative changes at multiple levels with foraminal narrowing, worst at C3-C4 and C4-C5. Upper chest:  Negative. Other: None IMPRESSION: No CT evidence for acute intracranial abnormality. Atrophy and chronic small vessel ischemic changes of the white matter. Degenerative changes of the cervical spine with anterior fusion hardware at C5-C6. No acute osseous abnormality. Electronically Signed   By: Jasmine Pang M.D.   On: 03/02/2023 21:50    Pending Labs Unresulted Labs (From admission, onward)     Start     Ordered   03/04/23 0500  CBC  Tomorrow morning,   R        03/03/23 0804   03/04/23 0500  Comprehensive metabolic panel  Tomorrow morning,   R        03/03/23 0804   03/03/23 1400  Basic metabolic panel  Once,   R        03/03/23 0804   03/03/23 1400  TSH  Once-Timed,   TIMED        03/03/23 0825   03/03/23 0340  Urine Culture  Once,   R        03/03/23 0340   03/02/23 2345  Cooxemetry Panel (carboxy, met, total hgb, O2 sat)  Once,   R        03/02/23 2345            Vitals/Pain Today's Vitals   03/03/23 0500 03/03/23 0545 03/03/23 0600 03/03/23 0800  BP: (!) 117/98 (!) 155/71 121/72 (!) 158/75  Pulse: 85 92 91 89  Resp: (!) 25 (!) 21  17 19  Temp:  98.4 F (36.9 C)    TempSrc:  Axillary    SpO2: 97% 98% 97% 99%  PainSc:  0-No pain 0-No pain     Isolation Precautions No active isolations  Medications Medications  potassium chloride SA (KLOR-CON M) CR tablet 40 mEq (40 mEq Oral Not Given 03/03/23 0226)  potassium chloride SA (KLOR-CON M) CR tablet 40 mEq (40 mEq Oral Not Given 03/03/23 0226)  aspirin EC tablet 81 mg (has no administration in time range)  cholecalciferol (VITAMIN D3) 25 MCG (1000 UNIT) tablet 2,000 Units (has no administration in time range)  donepezil (ARICEPT) tablet 10 mg (10 mg Oral Not Given 03/03/23 0227)  losartan (COZAAR) tablet 50 mg (has no administration in time range)  memantine (NAMENDA) tablet 10 mg (10 mg Oral Not Given 03/03/23 0227)  multivitamin with minerals tablet 1 tablet (has no administration in time range)  simvastatin (ZOCOR)  tablet 40 mg (has no administration in time range)  levothyroxine (SYNTHROID) tablet 50 mcg (50 mcg Oral Given 03/03/23 0617)  cefTRIAXone (ROCEPHIN) 2 g in sodium chloride 0.9 % 100 mL IVPB (has no administration in time range)  calcium gluconate 2 g/ 100 mL sodium chloride IVPB (has no administration in time range)  enoxaparin (LOVENOX) injection 40 mg (has no administration in time range)  sodium chloride flush (NS) 0.9 % injection 3 mL (has no administration in time range)  acetaminophen (TYLENOL) tablet 650 mg (has no administration in time range)    Or  acetaminophen (TYLENOL) suppository 650 mg (has no administration in time range)  ondansetron (ZOFRAN) tablet 4 mg (has no administration in time range)    Or  ondansetron (ZOFRAN) injection 4 mg (has no administration in time range)  albuterol (PROVENTIL) (2.5 MG/3ML) 0.083% nebulizer solution 2.5 mg (has no administration in time range)  pantoprazole (PROTONIX) EC tablet 40 mg (has no administration in time range)  nicotine (NICODERM CQ - dosed in mg/24 hours) patch 21 mg (has no administration in time range)  ipratropium-albuterol (DUONEB) 0.5-2.5 (3) MG/3ML nebulizer solution 3 mL (has no administration in time range)  furosemide (LASIX) tablet 40 mg (has no administration in time range)  methylPREDNISolone sodium succinate (SOLU-MEDROL) 125 mg/2 mL injection 125 mg (has no administration in time range)  predniSONE (DELTASONE) tablet 40 mg (has no administration in time range)  lactated ringers bolus 1,000 mL (0 mLs Intravenous Stopped 03/03/23 0022)  cefTRIAXone (ROCEPHIN) 2 g in sodium chloride 0.9 % 100 mL IVPB (0 g Intravenous Stopped 03/03/23 0618)  sodium chloride 0.9 % bolus 1,000 mL (1,000 mLs Intravenous New Bag/Given 03/03/23 0546)    Mobility Unknown, pt unable to answer     Focused Assessments     R Recommendations: See Admitting Provider Note  Report given to:   Additional

## 2023-03-03 NOTE — ED Notes (Signed)
Patient hygiene preformed.  Patient was incontinent of stool and urine.  New sheets and gown applied.  Warm blankets applied for comfort

## 2023-03-03 NOTE — Progress Notes (Signed)
Patient has been pulling out oxygen, telemetry and her IV cannula, mittens applied on both hands.

## 2023-03-03 NOTE — H&P (Addendum)
History and Physical    Patient: [name] ZOX:[id number] DOB: Feb 02, 1936 DOA: 03/02/2023 DOS: the patient was seen and examined on 03/03/2023 PCP: Plotnikov, Georgina Quint, MD  Patient coming from: Home via EMS  Chief Complaint:  Chief Complaint  Patient presents with   Fall   HPI: [name] is a 87 y.o. female with medical history significant of hypertension, hyperlipidemia,  COPD, hypothyroidism, severe dementia, tobacco abuse, and gerd who presents after being found down at home.  History is obtained from review of records and family over the phone as the patient has severe dementia.  At baseline patient is cared for by her husband and normally only oriented to self and is not on oxygen at baseline.  Last time family had contact with them was approximately 2 days ago.  Patient had a hair appointment scheduled yesterday which she missed and the hairdresser became concerned notifying family.  The police had to be called to get into the house and both patients were found laying on the ground for some unknown amount of time.  EMS documented missing patient has been wearing same close from a week ago.  Patient reports having some pain on the right side of her face.  In the emergency department patient was noted to be afebrile with pulse 84-96, respirations 14-31, blood pressure 90/74 to 170/83, and O2 saturations documented as low as 70% on room air with improvement on 2 L of nasal cannula oxygen to greater than 92%.  Labs significant for WBC 11.1, hemoglobin 16.2, platelet count 131, potassium 3, BUN 20, creatinine 1.09, calcium 8.1, AST 53, total bilirubin 1.9, lactic acid 1.8, and CK 725.  CT scan of the head showed no evidence of any acute intercranial abnormality.  Chest x-ray noted no active disease with hyperinflation from emphysema with bronchiectasis changes.  X-rays of the pelvis did not show any acute fracture.  Urinalysis was positive for large leukocytes positive  nitrites, many bacteria, 11-20 RBCs/hpf, and greater than 50 WBCs.  Urine culture was sent.  Patient has been bolused a total of 2 L of IV fluids and given Rocephin 2 g IV f.    Review of Systems: As mentioned in the history of present illness. All other systems reviewed and are negative. Past Medical History:  Diagnosis Date   Anxiety    COPD (chronic obstructive pulmonary disease) (HCC)    Dementia (HCC)    Diverticulosis of colon (without mention of hemorrhage)    Gallstones    GERD (gastroesophageal reflux disease)    Hiatal hernia    Hyperlipidemia    Hypertension    Hypothyroidism    Low back pain    OA R radiculopathy   Lower extremity edema    chronic   Osteopenia    Peptic ulcer    Peripheral vascular disease (HCC)    Personal history of colonic polyps 2001   TUBULAR ADENOMA - [place]   Pneumonia    Past Surgical History:  Procedure Laterality Date   ABDOMINAL ADHESION SURGERY     ABDOMINAL HYSTERECTOMY     APPENDECTOMY     CARPAL TUNNEL RELEASE Bilateral    CATARACT EXTRACTION W/ INTRAOCULAR LENS  IMPLANT, BILATERAL     CHOLECYSTECTOMY     NECK SURGERY     bone and plate    ROTATOR CUFF REPAIR Bilateral    TUBAL LIGATION     Social History:  reports that she has been smoking cigarettes. She has a 91.50 pack-year smoking  history. She has never used smokeless tobacco. She reports that she does not drink alcohol and does not use drugs.  Allergies  Allergen Reactions   Codeine Sulfate Other (See Comments)    Extreme headaches   Exelon [Rivastigmine Tartrate]     nausea   Oxycodone-Acetaminophen Other (See Comments)    Extreme headaches    Family History  Problem Relation Age of Onset   Dementia Mother    Hypertension Father    Heart disease Father    Colon cancer Sister    Heart disease Sister    Esophageal cancer Neg Hx    Rectal cancer Neg Hx    Stomach cancer Neg Hx     Prior to Admission medications   Medication Sig Start Date End Date Taking?  Authorizing Provider  donepezil (ARICEPT) 10 MG tablet Take 1 tablet (10 mg total) by mouth at bedtime. Annual appt due in Oct must see provider for future refills 05/01/22  Yes Everlena Cooper, Adam R, DO  furosemide (LASIX) 20 MG tablet Take 2 tablets (40 mg total) by mouth daily. 07/24/22  Yes Plotnikov, Georgina Quint, MD  losartan (COZAAR) 50 MG tablet Take 1 tablet (50 mg total) by mouth daily. 06/20/22  Yes Plotnikov, Georgina Quint, MD  memantine (NAMENDA) 10 MG tablet Take 1 tablet (10 mg total) by mouth 2 (two) times daily. Follow-up appt due in August must see provider for future refills 05/01/22  Yes Everlena Cooper, Adam R, DO  omeprazole (PRILOSEC) 40 MG capsule TAKE 1 CAPSULE DAILY Patient taking differently: Take 40 mg by mouth daily. 07/27/22  Yes Plotnikov, Georgina Quint, MD  simvastatin (ZOCOR) 40 MG tablet Take 1 tablet (40 mg total) by mouth daily at 6 PM. 06/20/22  Yes Plotnikov, Georgina Quint, MD  SYNTHROID 50 MCG tablet Take 1 tablet (50 mcg total) by mouth daily before breakfast. 07/14/22  Yes Plotnikov, Georgina Quint, MD  umeclidinium bromide (INCRUSE ELLIPTA) 62.5 MCG/ACT AEPB Inhale 1 puff into the lungs daily. 06/29/22  Yes Plotnikov, Georgina Quint, MD  acetaminophen (TYLENOL) 325 MG tablet Take 650 mg by mouth every 6 (six) hours as needed.    [provider]  aspirin 81 MG EC tablet Take 81 mg by mouth daily.    [provider]  Cholecalciferol (VITAMIN D3) 2000 units capsule Take 1 capsule (2,000 Units total) daily by mouth. 08/15/17   Plotnikov, Georgina Quint, MD  Diclofenac Sodium 1.5 % SOLN Place 3-5 drops onto the skin 3 (three) times daily as needed. For pain applies on joints    [provider]  Multiple Vitamin (MULTIVITAMIN) tablet Take 1 tablet by mouth daily.    [provider]  ondansetron (ZOFRAN) 4 MG tablet Take 1 tablet (4 mg total) by mouth every 6 (six) hours as needed. Take  1 tab every 6 hours as needed for nausea and vomiting. 11/03/20   Plotnikov, Georgina Quint, MD     Physical Exam: Vitals:   03/03/23 0400 03/03/23 0500 03/03/23 0545 03/03/23 0600  BP: (!) 146/66 (!) 117/98 (!) 155/71 121/72  Pulse: 89 85 92 91  Resp: (!) 31 (!) 25 (!) 21 17  Temp:   98.4 F (36.9 C)   TempSrc:   Axillary   SpO2: 95% 97% 98% 97%   Constitutional: Elderly female who appears to be in no acute distress at this time and able to follow commands Eyes: PERRL, lids and conjunctivae normal ENMT: Mucous membranes are moist.  Hard of hearing. Neck: normal, supple Respiratory: Decreased  aeration with expiratory wheezes appreciated in both lung fields.  Patient on 2 L of nasal cannula oxygen with O2 saturation currently maintained. Cardiovascular: Regular rate and rhythm, No extremity edema. 2+ pedal pulses. No carotid bruits.  Abdomen: no tenderness, no masses palpated. Bowel sounds positive.  Musculoskeletal: no clubbing / cyanosis. No joint deformity upper and lower extremities. Good ROM, no contractures. Normal muscle tone.  Skin: Bruising noted of the lateral aspect of the right eye and right cheek with some mild swelling. Neurologic: CN 2-12 grossly intact.  Patient able to move all extremities. Psychiatric: Alert and oriented only to self.  Normal mood.  Data Reviewed:  EKG reveals sinus rhythm at 96 bpm  with Borderline right axis deviation. Reviewed labs, imaging, and pertinent records as noted above in HPI.  Assessment and Plan: Fall at home Patient and husband were both found down at home.  CT scan of the head and cervical spine did not note any acute abnormality.  No fractures noted on x-ray of the chest or pelvis.  Placement needed given patient's husband is the patient's main caregiver and he is currently also in the hospital. -Admit to telemetry bed -PT/OT to evaluate and treat -Transitions of care   Urinary tract infection with hematuria Acute.  Patient presents after being found down with her husband at home.   Urinalysis was positive for large  leukocytes positive nitrites, many bacteria, 11-20 RBCs/hpf, and greater than 50 WBCs.  Urine cultures have been sent and patient has been started on empiric antibiotics of Rocephin. -Admit to medical telemetry bed -Follow-up urine culture -Continue empiric antibiotics of Rocephin and adjust as needed based off cultures  Acute respiratory failure with hypoxia Acute COPD exacerbation On the emergency department patient was noted to have O2 saturation dropped down as low as 70% for which patient was placed on 2 L of nasal cannula oxygen with O2 saturations greater than 92%.  Normally patient not on oxygen at baseline.  On physical exam patient with expiratory wheezes and decreased overall aeration.  Initial chest x-ray had noted no acute abnormality.  However, patient had received 2 L of IV fluids.  No documented history of congestive heart failure, but patient is on Lasix. -Continuous pulse oximetry with nasal cannula oxygen to maintain O2 saturation greater than 92% -Incentive spirometry -Albuterol nebs as needed for shortness of breath/wheezing -Empiric antibiotics as noted below  Leukocytosis Acute.  WBC elevated at 11.1.  Suspect secondary to UTI and/or fall.  -Recheck CBC tomorrow morning  Hypokalemia Hypocalcemia Acute.  Labs from yesterday evening note potassium 3 and calcium 8.1.  Magnesium level was noted to be within normal limits.  Patient has been given a total of 80 mEq of potassium chloride. -Give calcium gluconate 2 g IV -Continue to monitor and replace as needed  Essential hypertension Blood pressures currently maintained -Continue home blood pressure regimen  Renal insufficiency Acute.  Patient noted to have creatinine of 1.09 with BUN 20 and CK7 125.  Baseline creatinine noted to be around 0.9.  Patient has been given 2 liters of IV fluids in the ED. -Holding any maintenance fluids at this time -Continue to monitor kidney function  Polycythemia Chronic.  Hemoglobin  noted to be 16.2 which appears similar to priors.  Thought secondary to patient's long history of tobacco abuse. -Continue to monitor  Thrombocytopenia Chronic.  Platelet count 131.  No reports of bleeding. -Continue to monitor  Hypothyroidism -Continue levothyroxine  Dementia Patient has significant dementia and  normally  cared for by her husband. -Delirium precautions -Continue Aricept and Namenda  Peripheral vascular disease -Continue aspirin and statin  Hyperlipidemia -Continue simvastatin  Tobacco abuse Patient's son notes that she smokes over a pack of cigarettes per day on average and has done so for over several years. -Nicotine patch offered  GERD -Continue pharmacy substitution of Protonix  DVT prophylaxis: Lovenox Advance Care Planning:   Code Status: Full Code   Consults: None  Family Communication: Patient's son updated over the phone  Severity of Illness: The appropriate patient status for this patient is INPATIENT. Inpatient status is judged to be reasonable and necessary in order to provide the required intensity of service to ensure the patient's safety. The patient's presenting symptoms, physical exam findings, and initial radiographic and laboratory data in the context of their chronic comorbidities is felt to place them at high risk for further clinical deterioration. Furthermore, it is not anticipated that the patient will be medically stable for discharge from the hospital within 2 midnights of admission.   * I certify that at the point of admission it is my clinical judgment that the patient will require inpatient hospital care spanning beyond 2 midnights from the point of admission due to high intensity of service, high risk for further deterioration and high frequency of surveillance required.*  Author: Clydie Braun, MD 03/03/2023 7:11 AM  For on call review [email or web address].

## 2023-03-04 DIAGNOSIS — J9601 Acute respiratory failure with hypoxia: Secondary | ICD-10-CM | POA: Diagnosis not present

## 2023-03-04 DIAGNOSIS — N3001 Acute cystitis with hematuria: Secondary | ICD-10-CM | POA: Diagnosis not present

## 2023-03-04 DIAGNOSIS — J441 Chronic obstructive pulmonary disease with (acute) exacerbation: Secondary | ICD-10-CM | POA: Diagnosis not present

## 2023-03-04 DIAGNOSIS — W19XXXA Unspecified fall, initial encounter: Secondary | ICD-10-CM | POA: Diagnosis not present

## 2023-03-04 LAB — CBC
HCT: 41.5 % (ref 36.0–46.0)
Hemoglobin: 13.9 g/dL (ref 12.0–15.0)
MCH: 29.4 pg (ref 26.0–34.0)
MCHC: 33.5 g/dL (ref 30.0–36.0)
MCV: 87.9 fL (ref 80.0–100.0)
Platelets: 101 10*3/uL — ABNORMAL LOW (ref 150–400)
RBC: 4.72 MIL/uL (ref 3.87–5.11)
RDW: 12.4 % (ref 11.5–15.5)
WBC: 6.8 10*3/uL (ref 4.0–10.5)
nRBC: 0 % (ref 0.0–0.2)

## 2023-03-04 LAB — COMPREHENSIVE METABOLIC PANEL
ALT: 33 U/L (ref 0–44)
AST: 37 U/L (ref 15–41)
Albumin: 3 g/dL — ABNORMAL LOW (ref 3.5–5.0)
Alkaline Phosphatase: 53 U/L (ref 38–126)
Anion gap: 9 (ref 5–15)
BUN: 19 mg/dL (ref 8–23)
CO2: 27 mmol/L (ref 22–32)
Calcium: 8.3 mg/dL — ABNORMAL LOW (ref 8.9–10.3)
Chloride: 104 mmol/L (ref 98–111)
Creatinine, Ser: 0.81 mg/dL (ref 0.44–1.00)
GFR, Estimated: >60 mL/min (ref >60)
Glucose, Bld: 204 mg/dL — ABNORMAL HIGH (ref 70–99)
Potassium: 3.9 mmol/L (ref 3.5–5.1)
Sodium: 140 mmol/L (ref 135–145)
Total Bilirubin: 1 mg/dL (ref 0.3–1.2)
Total Protein: 5.4 g/dL — ABNORMAL LOW (ref 6.5–8.1)

## 2023-03-04 LAB — URINE CULTURE

## 2023-03-04 MED ORDER — FUROSEMIDE 40 MG PO TABS
40.0000 mg | ORAL_TABLET | Freq: Every day | ORAL | Status: DC
  Administered 2023-03-05 – 2023-03-07 (×3): 40 mg via ORAL
  Filled 2023-03-04 (×3): qty 1

## 2023-03-04 MED ORDER — IPRATROPIUM-ALBUTEROL 0.5-2.5 (3) MG/3ML IN SOLN: 3.0000 mL | RESPIRATORY_TRACT | Status: DC | PRN

## 2023-03-04 MED ORDER — IPRATROPIUM-ALBUTEROL 0.5-2.5 (3) MG/3ML IN SOLN
3.0000 mL | Freq: Three times a day (TID) | RESPIRATORY_TRACT | Status: DC
  Administered 2023-03-04: 3 mL via RESPIRATORY_TRACT

## 2023-03-04 MED ORDER — BUDESONIDE 0.5 MG/2ML IN SUSP
0.5000 mg | Freq: Two times a day (BID) | RESPIRATORY_TRACT | Status: DC
  Administered 2023-03-04 – 2023-03-07 (×6): 0.5 mg via RESPIRATORY_TRACT
  Filled 2023-03-04 (×6): qty 2

## 2023-03-04 MED ORDER — IPRATROPIUM-ALBUTEROL 0.5-2.5 (3) MG/3ML IN SOLN
RESPIRATORY_TRACT | Status: AC
  Filled 2023-03-04: qty 3

## 2023-03-04 MED ORDER — IPRATROPIUM-ALBUTEROL 0.5-2.5 (3) MG/3ML IN SOLN: 3.0000 mL | Freq: Three times a day (TID) | RESPIRATORY_TRACT | Status: DC

## 2023-03-04 MED ORDER — IPRATROPIUM-ALBUTEROL 0.5-2.5 (3) MG/3ML IN SOLN
3.0000 mL | Freq: Two times a day (BID) | RESPIRATORY_TRACT | Status: DC
  Administered 2023-03-04 – 2023-03-07 (×5): 3 mL via RESPIRATORY_TRACT
  Filled 2023-03-04 (×6): qty 3

## 2023-03-04 MED ORDER — FLUTICASONE PROPIONATE 50 MCG/ACT NA SUSP
2.0000 | Freq: Every day | NASAL | Status: DC
  Administered 2023-03-04 – 2023-03-07 (×4): 2 via NASAL
  Filled 2023-03-04: qty 16

## 2023-03-04 MED ORDER — BUDESONIDE 0.5 MG/2ML IN SUSP
RESPIRATORY_TRACT | Status: AC
  Filled 2023-03-04: qty 2

## 2023-03-04 MED ORDER — SODIUM CHLORIDE 0.9 % IV SOLN: INTRAVENOUS | Status: DC

## 2023-03-04 MED ORDER — LORATADINE 10 MG PO TABS
10.0000 mg | ORAL_TABLET | Freq: Every day | ORAL | Status: DC
  Administered 2023-03-04 – 2023-03-07 (×4): 10 mg via ORAL
  Filled 2023-03-04 (×4): qty 1

## 2023-03-04 NOTE — Progress Notes (Signed)
PROGRESS NOTE    [name]  HQI:[id number] DOB: 1935/11/21 DOA: 03/02/2023 PCP: [name], MD    Chief Complaint  Patient presents with   Fall    Brief Narrative: Patient 87 year old female history of hypertension, hyperlipidemia, COPD, severe dementia, hypothyroidism, tobacco abuse, GERD presented to ED after being found down at home.  In the ED patient noted to be hypoxic with sats of 70% on room air improved on 2 L nasal cannula, noted to have a leukocytosis, urinalysis concerning for UTI.  Imaging studies negative.  Patient admitted for UTI, falls, acute COPD exacerbation.  Patient placed empirically on IV antibiotics.   Assessment & Plan:   Principal Problem:   Fall at home, initial encounter Active Problems:   Urinary tract infection with hematuria   COPD with acute exacerbation (HCC)   Acute respiratory failure with hypoxia (HCC)   Leukocytosis   Hypokalemia   Hypocalcemia   Hypertension   Renal insufficiency   Polycythemia   Thrombocytopenia (HCC)   Hypothyroidism   Alzheimer's dementia (HCC)   PVD (peripheral vascular disease) (HCC)   Dyslipidemia   Tobacco dependence   GERD (gastroesophageal reflux disease)  #1 falls -Patient noted to have a fall at home both patient and husband found down on the floor. -Likely secondary to UTI. -CT head CT C-spine negative for any acute abnormalities. -Chest x-ray plain films of the pelvis unremarkable. -PT/OT. -Patient likely needs placement as patient's husband is main caregiver and currently also hospitalized. -TOC consulted.  2.  UTI with hematuria -Patient noted to be found down with her husband at home, urinalysis with large leukocytes, positive nitrites, many bacteria, 11-20 RBCs, WBC > 50. -Urine cultures with preliminary results of > 100,000 colonies of Klebsiella pneumoniae with sensitivities pending. -Continue empiric IV Rocephin. -IV fluids. -Supportive care.  3.  Acute respiratory  failure with hypoxia/acute COPD exacerbation -Patient noted on presentation to be hypoxic with sats of 70% on room air and improved on 2 L nasal cannula. -Patient noted not chronically on home O2. -Patient on presentation with diffuse expiratory wheezing and treated for acute COPD exacerbation. -Placed on Pulmicort twice daily, scheduled DuoNebs, Claritin, Flonase. -Continue prednisone, PPI. -Wean O2.  4.  Hypokalemia/hypocalcemia -Status post IV calcium gluconate. -Corrected calcium approximately 9.1. -Potassium repleted. -Magnesium noted at 1.9. -Repeat labs in the AM.  5.  Hypertension -BP stable. -Continue Lasix, Cozaar.  6.  GERD -PPI.  7.  Renal insufficiency -Patient noted on admission to have a creatinine of 1.09 BUN of 20 with baseline creatinine about 0.9. -Status post IV fluids. -Creatinine stable at 0.81.  8.  Polycythemia -Chronic. -Felt secondary to patient's history of tobacco use. -Some improvement with hemoglobin currently at 13.9.  9.  Chronic thrombocytopenia -Platelet count trending down likely secondary to acute infection.  No overt bleeding.   -Follow.  10.  Hyperlipidemia -Statin.  11.  Dementia -Patient with advanced dementia, normally cared for by her husband. -Continue Aricept, Namenda. -Delirium precautions.  12.  Tobacco abuse -Per admitting physician patient's son notes patient smokes about a pack of cigarettes per day on average and done so for several years. -Nicotine patch.  13.  Peripheral vascular disease -Continue aspirin, statin.   DVT prophylaxis: Lovenox  code Status: Full Family Communication: No family at bedside. Disposition: Likely needs SNF/patient with history of dementia.  Husband is only caregiver who is currently hospitalized.  Status is: Inpatient Remains inpatient appropriate because: Severity of illness   Consultants:  None  Procedures: CT head CT C-spine 03/02/2023 Chest x-ray 03/02/2023 Plain films of  the pelvis 03/02/2023  Antimicrobials:  Anti-infectives (From admission, onward)    Start     Dose/Rate Route Frequency Ordered Stop   03/04/23 0500  cefTRIAXone (ROCEPHIN) 2 g in sodium chloride 0.9 % 100 mL IVPB        2 g 200 mL/hr over 30 Minutes Intravenous Every 24 hours 03/03/23 0804     03/03/23 0500  cefTRIAXone (ROCEPHIN) 2 g in sodium chloride 0.9 % 100 mL IVPB        2 g 200 mL/hr over 30 Minutes Intravenous  Once 03/03/23 0458 03/03/23 0618         Subjective: Patient sitting up in bed getting fed by RN.  Patient alert but oriented to self only.  Denies any chest pain or shortness of breath.  No abdominal pain.  Objective: Vitals:   03/03/23 2022 03/04/23 0450 03/04/23 0744 03/04/23 0841  BP: (!) 126/52 128/79 (!) 162/66   Pulse: (!) 102 88 85   Resp: 18 18 18    Temp: 97.8 F (36.6 C) 98.1 F (36.7 C) 98.6 F (37 C)   TempSrc: Oral Axillary Oral   SpO2: 100% 100% 94% 94%    Intake/Output Summary (Last 24 hours) at 03/04/2023 1215 Last data filed at 03/04/2023 1610 Gross per 24 hour  Intake 300 ml  Output --  Net 300 ml   There were no vitals filed for this visit.  Examination:  General exam: Appears calm and comfortable  Respiratory system: Clear to auscultation anterior lung fields.Marland Kitchen Respiratory effort normal. Cardiovascular system: S1 & S2 heard, RRR. No JVD, murmurs, rubs, gallops or clicks. No pedal edema. Gastrointestinal system: Abdomen is nondistended, soft and nontender. No organomegaly or masses felt. Normal bowel sounds heard. Central nervous system: Alert and oriented to self only. No focal neurological deficits. Extremities: Symmetric 5 x 5 power. Skin: No rashes, lesions or ulcers Psychiatry: Judgement and insight appear normal. Mood & affect appropriate.     Data Reviewed: I have personally reviewed following labs and imaging studies  CBC: Recent Labs  Lab 03/02/23 2023 03/04/23 0209  WBC 11.1* 6.8  NEUTROABS 8.7*  --   HGB  16.2* 13.9  HCT 48.5* 41.5  MCV 89.5 87.9  PLT 131* 101*    Basic Metabolic Panel: Recent Labs  Lab 03/02/23 2225 03/02/23 2336 03/03/23 1442 03/04/23 0209  NA 142  --  138 140  K 3.0*  --  3.0* 3.9  CL 104  --  105 104  CO2 25  --  25 27  GLUCOSE 168*  --  195* 204*  BUN 20  --  18 19  CREATININE 1.09*  --  0.83 0.81  CALCIUM 8.1*  --  8.7* 8.3*  MG  --  1.9  --   --     GFR: CrCl cannot be calculated (Unknown ideal weight.).  Liver Function Tests: Recent Labs  Lab 03/02/23 2225 03/04/23 0209  AST 53* 37  ALT 36 33  ALKPHOS 57 53  BILITOT 1.9* 1.0  PROT 6.1* 5.4*  ALBUMIN 3.4* 3.0*    CBG: No results for input(s): "GLUCAP" in the last 168 hours.   Recent Results (from the past 240 hour(s))  Urine Culture     Status: Abnormal (Preliminary result)   Collection Time: 03/03/23  3:40 AM   Specimen: Urine, Random  Result Value Ref Range Status   Specimen Description URINE, RANDOM  Final  Special Requests NONE Reflexed from [id number]  Final   Culture (A)  Final    >=100,000 COLONIES/mL KLEBSIELLA PNEUMONIAE SUSCEPTIBILITIES TO FOLLOW Performed at [facility], [address] [place], [place] [address]    Report Status PENDING  Incomplete         Radiology Studies: DG Chest Port 1 View  Result Date: 03/02/2023 CLINICAL DATA:  Fall EXAM: PORTABLE CHEST 1 VIEW COMPARISON:  05/01/2022 FINDINGS: Hyperinflation with emphysema and bronchitic changes. No acute airspace disease, pleural effusion or pneumothorax. Stable cardiomediastinal silhouette with aortic atherosclerosis. IMPRESSION: No active disease. Hyperinflation with emphysema and bronchitic changes. Electronically Signed   By: Jasmine Pang M.D.   On: 03/02/2023 22:21   DG Pelvis Portable  Result Date: 03/02/2023 CLINICAL DATA:  Fall EXAM: PORTABLE PELVIS 1-2 VIEWS COMPARISON:  CT 02/20/2014 FINDINGS: There is no evidence of pelvic fracture or diastasis. No pelvic bone lesions are seen.  IMPRESSION: Negative. Electronically Signed   By: Jasmine Pang M.D.   On: 03/02/2023 22:20   CT Head Wo Contrast  Result Date: 03/02/2023 CLINICAL DATA:  Neck trauma fall EXAM: CT HEAD WITHOUT CONTRAST CT CERVICAL SPINE WITHOUT CONTRAST TECHNIQUE: Multidetector CT imaging of the head and cervical spine was performed following the standard protocol without intravenous contrast. Multiplanar CT image reconstructions of the cervical spine were also generated. RADIATION DOSE REDUCTION: This exam was performed according to the departmental dose-optimization program which includes automated exposure control, adjustment of the mA and/or kV according to patient size and/or use of iterative reconstruction technique. COMPARISON:  None Available. FINDINGS: CT HEAD FINDINGS Brain: No acute territorial infarction, hemorrhage or intracranial mass. Moderate patchy white matter hypodensity likely chronic small vessel ischemic change. Atrophy. Nonenlarged ventricles Vascular: No hyperdense vessels.  Carotid vascular calcification Skull: Normal. Negative for fracture or focal lesion. Sinuses/Orbits: No acute finding. Other: None CT CERVICAL SPINE FINDINGS Alignment: No subluxation.  Facet alignment is within normal limits. Skull base and vertebrae: No acute fracture. No primary bone lesion or focal pathologic process. Soft tissues and spinal canal: No prevertebral fluid or swelling. No visible canal hematoma. Disc levels: Anterior fusion hardware C5-C6. Advanced degenerative changes at C3-C4 and C4-C5. Facet degenerative changes at multiple levels with foraminal narrowing, worst at C3-C4 and C4-C5. Upper chest: Negative. Other: None IMPRESSION: No CT evidence for acute intracranial abnormality. Atrophy and chronic small vessel ischemic changes of the white matter. Degenerative changes of the cervical spine with anterior fusion hardware at C5-C6. No acute osseous abnormality. Electronically Signed   By: Jasmine Pang M.D.   On:  03/02/2023 21:50   CT Cervical Spine Wo Contrast  Result Date: 03/02/2023 CLINICAL DATA:  Neck trauma fall EXAM: CT HEAD WITHOUT CONTRAST CT CERVICAL SPINE WITHOUT CONTRAST TECHNIQUE: Multidetector CT imaging of the head and cervical spine was performed following the standard protocol without intravenous contrast. Multiplanar CT image reconstructions of the cervical spine were also generated. RADIATION DOSE REDUCTION: This exam was performed according to the departmental dose-optimization program which includes automated exposure control, adjustment of the mA and/or kV according to patient size and/or use of iterative reconstruction technique. COMPARISON:  None Available. FINDINGS: CT HEAD FINDINGS Brain: No acute territorial infarction, hemorrhage or intracranial mass. Moderate patchy white matter hypodensity likely chronic small vessel ischemic change. Atrophy. Nonenlarged ventricles Vascular: No hyperdense vessels.  Carotid vascular calcification Skull: Normal. Negative for fracture or focal lesion. Sinuses/Orbits: No acute finding. Other: None CT CERVICAL SPINE FINDINGS Alignment: No subluxation.  Facet alignment is within  normal limits. Skull base and vertebrae: No acute fracture. No primary bone lesion or focal pathologic process. Soft tissues and spinal canal: No prevertebral fluid or swelling. No visible canal hematoma. Disc levels: Anterior fusion hardware C5-C6. Advanced degenerative changes at C3-C4 and C4-C5. Facet degenerative changes at multiple levels with foraminal narrowing, worst at C3-C4 and C4-C5. Upper chest: Negative. Other: None IMPRESSION: No CT evidence for acute intracranial abnormality. Atrophy and chronic small vessel ischemic changes of the white matter. Degenerative changes of the cervical spine with anterior fusion hardware at C5-C6. No acute osseous abnormality. Electronically Signed   By: Jasmine Pang M.D.   On: 03/02/2023 21:50        Scheduled Meds:  aspirin EC  81 mg  Oral Daily   budesonide (PULMICORT) nebulizer solution  0.5 mg Nebulization BID   cholecalciferol  2,000 Units Oral Daily   donepezil  10 mg Oral QHS   enoxaparin (LOVENOX) injection  40 mg Subcutaneous Q24H   fluticasone  2 spray Each Nare Daily   furosemide  40 mg Oral Daily   ipratropium-albuterol  3 mL Nebulization BID   levothyroxine  50 mcg Oral QAC breakfast   loratadine  10 mg Oral Daily   losartan  50 mg Oral Daily   memantine  10 mg Oral BID   multivitamin with minerals  1 tablet Oral Daily   nicotine  21 mg Transdermal Q0600   pantoprazole  40 mg Oral Daily   predniSONE  40 mg Oral Q breakfast   simvastatin  40 mg Oral q1800   sodium chloride flush  3 mL Intravenous Q12H   Continuous Infusions:  cefTRIAXone (ROCEPHIN)  IV Stopped (03/04/23 0542)     LOS: 1 day    Time spent: 35 minutes    Ramiro Harvest, MD [facility]   To contact the attending provider between 7A-7P or the covering provider during after hours 7P-7A, please log into the web site [email or web address] and access using universal [place] password for that web site. If you do not have the password, please call the hospital operator.  03/04/2023, 12:15 PM

## 2023-03-04 NOTE — Evaluation (Signed)
Physical Therapy Evaluation Patient Details Name: [name] MRN: [id number] DOB: [birth year] Today's Date: 03/04/2023  History of Present Illness  Admitted after being found down in her home (husband down as well); notable bruising R side of face; Husband is her caregiver and is currently admitted as well;  Clinical Impression   Pt admitted with above diagnosis. Lives at home with husband, who is her caregiver; Pt's PLOF is not entirely known, thaough we can ifer that she needed assist /supervision for safety due to her declining cognition; Presents to PT with balance deficits, increased fall risk;  Pleasantly confused and engaged in conversation with socially appropriate convention; Overall needing min assist for bed mobility, transfers, and uncomplicated amb; Agree taht she needs to go to a venue where she has 24 hour supervision/assist; In considering options for discharge, I value going back to familiar environment, caregivers, and routines for patients with dementia -- I'm curious if any other family members can provide assist while pt's husband recovers; Pt currently with functional limitations due to the deficits listed below (see PT Problem List). Pt will benefit from skilled PT to increase their independence and safety with mobility to allow discharge to the venue listed below.          Recommendations for follow up therapy are one component of a multi-disciplinary discharge planning process, led by the attending physician.  Recommendations may be updated based on patient status, additional functional criteria and insurance authorization.  Follow Up Recommendations Can patient physically be transported by private vehicle: Yes     Assistance Recommended at Discharge Frequent or constant Supervision/Assistance (for safety; how much help can pt's adult children provide while husband is in the hospital?)  Patient can return home with the following  A little help with walking  and/or transfers    Equipment Recommendations Rolling walker (2 wheels)  Recommendations for Other Services       Functional Status Assessment Patient has had a recent decline in their functional status and demonstrates the ability to make significant improvements in function in a reasonable and predictable amount of time.     Precautions / Restrictions Precautions Precautions: Fall Restrictions Weight Bearing Restrictions: No      Mobility  Bed Mobility Overal bed mobility: Needs Assistance Bed Mobility: Supine to Sit     Supine to sit: Min assist     General bed mobility comments: min handheld assist to pull to sit    Transfers Overall transfer level: Needs assistance Equipment used: Rolling walker (2 wheels) Transfers: Sit to/from Stand Sit to Stand: Min guard           General transfer comment: Close guard for safety; good rise    Ambulation/Gait Ambulation/Gait assistance: Min guard, Min assist Gait Distance (Feet): 20 Feet (to/from bathroom) Assistive device: Rolling walker (2 wheels) Gait Pattern/deviations: Step-through pattern, Decreased step length - right, Decreased step length - left       General Gait Details: Tends to leave RW behind without cues for use; occasional short, uncoordinated steps, especially with turns  Careers information officer    Modified Rankin (Stroke Patients Only)       Balance Overall balance assessment: Needs assistance   Sitting balance-Leahy Scale: Fair       Standing balance-Leahy Scale: Poor (approaching Fair)  Pertinent Vitals/Pain Pain Assessment Pain Assessment: Faces Faces Pain Scale: No hurt Pain Intervention(s): Monitored during session    Home Living Family/patient expects to be discharged to:: Private residence Living Arrangements: Spouse/significant other Available Help at Discharge: Family (Hausband is caregier per chart  review) Type of Home: House             Additional Comments: Will need clarification of home environment and prior level of function as it becomes available    Prior Function Prior Level of Function : Needs assist  Cognitive Assist : Mobility (cognitive);ADLs (cognitive)                   Hand Dominance        Extremity/Trunk Assessment   Upper Extremity Assessment Upper Extremity Assessment: Defer to OT evaluation    Lower Extremity Assessment Lower Extremity Assessment: Generalized weakness (with decr coordination)    Cervical / Trunk Assessment Cervical / Trunk Assessment: Normal  Communication   Communication: No difficulties;Other (comment) (engages in pleasantly confused conversation)  Cognition Arousal/Alertness: Awake/alert Behavior During Therapy: WFL for tasks assessed/performed (including easy conversation with social conventions intact) Overall Cognitive Status: No family/caregiver present to determine baseline cognitive functioning                                 General Comments: Noting history of advanced dementia        General Comments General comments (skin integrity, edema, etc.): Pleasantly confused, following all commands/suggestions; noting unkept fingernails -- need a closer look and good handwashing    Exercises     Assessment/Plan    PT Assessment Patient needs continued PT services  PT Problem List Decreased strength;Decreased activity tolerance;Decreased balance;Decreased coordination;Decreased cognition;Decreased knowledge of use of DME;Decreased safety awareness;Decreased knowledge of precautions       PT Treatment Interventions DME instruction;Gait training;Stair training;Functional mobility training;Therapeutic activities;Therapeutic exercise;Balance training;Neuromuscular re-education;Cognitive remediation;Patient/family education    PT Goals (Current goals can be found in the Care Plan section)  Acute Rehab  PT Goals Patient Stated Goal: Agreeable to getting to bathroom PT Goal Formulation: Patient unable to participate in goal setting Time For Goal Achievement: 03/18/23 Potential to Achieve Goals: Fair    Frequency Min 2X/week     Co-evaluation PT/OT/SLP Co-Evaluation/Treatment: Yes Reason for Co-Treatment: To address functional/ADL transfers;Necessary to address cognition/behavior during functional activity;For patient/therapist safety PT goals addressed during session: Mobility/safety with mobility OT goals addressed during session: ADL's and self-care       AM-PAC PT "6 Clicks" Mobility  Outcome Measure Help needed turning from your back to your side while in a flat bed without using bedrails?: None Help needed moving from lying on your back to sitting on the side of a flat bed without using bedrails?: A Little Help needed moving to and from a bed to a chair (including a wheelchair)?: A Little Help needed standing up from a chair using your arms (e.g., wheelchair or bedside chair)?: A Little Help needed to walk in hospital room?: A Little Help needed climbing 3-5 steps with a railing? : A Lot 6 Click Score: 18    End of Session Equipment Utilized During Treatment: Gait belt Activity Tolerance: Patient tolerated treatment well Patient left: in chair;with call bell/phone within reach;with chair alarm set Nurse Communication: Mobility status PT Visit Diagnosis: Unsteadiness on feet (R26.81)    Time: 1610-9604 PT Time Calculation (min) (ACUTE ONLY): 42 min   Charges:  PT Evaluation $PT Eval Moderate Complexity: 1 Mod          Van Clines, PT  Acute Rehabilitation Services Office [phone] Secure Chat welcomed   Levi Aland 03/04/2023, 5:24 PM

## 2023-03-04 NOTE — Plan of Care (Signed)
  Problem: Clinical Measurements: Goal: Will remain free from infection Outcome: Not Progressing   Problem: Safety: Goal: Ability to remain free from injury will improve Outcome: Not Progressing   

## 2023-03-04 NOTE — Evaluation (Signed)
Occupational Therapy Evaluation Patient Details Name: [name] MRN: [id number] DOB: May 07, 1936 Today's Date: 03/04/2023   History of Present Illness Pt is an 87 y.o. female presenting after being found down on home for unknown length of time (husband found down as well). otable bruising R side of face; Husband is her caregiver and is currently admitted as well. X-rays chest and pelvis no acute findings. CT head negative for acute findings. Urinalysis positive for large leukocytes and nitrites. PMH significant for hypertension, hyperlipidemia, COPD, hypothyroidism, severe dementia, tobacco abuse, and GERD.   Clinical Impression   Per chart, PTA, pt lived with husband who was her caregiver due to her baseline dementia. Upon eval, pt pleasantly confused with decr strength and balance. Pt requiring significant cueing to attend to task and for problem solving during all ADL. Pt requiring min guard and mod-max cues for toileting and grooming this session. Per chart, husband acutely hospitalized as well, so recommending SNF for now, but pending family ability to assist at home, may be able to go home.      Recommendations for follow up therapy are one component of a multi-disciplinary discharge planning process, led by the attending physician.  Recommendations may be updated based on patient status, additional functional criteria and insurance authorization.   Assistance Recommended at Discharge Frequent or constant Supervision/Assistance  Patient can return home with the following A little help with walking and/or transfers;A little help with bathing/dressing/bathroom;Assistance with cooking/housework;Direct supervision/assist for medications management;Direct supervision/assist for financial management;Help with stairs or ramp for entrance;Assist for transportation    Functional Status Assessment  Patient has had a recent decline in their functional status and demonstrates the ability to  make significant improvements in function in a reasonable and predictable amount of time.  Equipment Recommendations  Other (comment) (defer)    Recommendations for Other Services       Precautions / Restrictions Precautions Precautions: Fall Restrictions Weight Bearing Restrictions: No      Mobility Bed Mobility Overal bed mobility: Needs Assistance Bed Mobility: Supine to Sit     Supine to sit: Min assist     General bed mobility comments: min handheld assist to pull to sit    Transfers Overall transfer level: Needs assistance Equipment used: Rolling walker (2 wheels) Transfers: Sit to/from Stand Sit to Stand: Min guard           General transfer comment: Close guard for safety; good rise      Balance Overall balance assessment: Needs assistance   Sitting balance-Leahy Scale: Fair       Standing balance-Leahy Scale: Poor                             ADL either performed or assessed with clinical judgement   ADL Overall ADL's : Needs assistance/impaired Eating/Feeding: Modified independent   Grooming: Wash/dry hands;Oral care;Min guard;Cueing for sequencing;Standing   Upper Body Bathing: Set up;Sitting   Lower Body Bathing: Min guard;Sit to/from stand   Upper Body Dressing : Set up;Sitting   Lower Body Dressing: Min guard;Sit to/from stand   Toilet Transfer: Min guard;Ambulation   Toileting- Clothing Manipulation and Hygiene: Moderate assistance;Sit to/from stand Toileting - Clothing Manipulation Details (indicate cue type and reason): Poor hygiene technique so providing assist to reduce risk of UTI     Functional mobility during ADLs: Min guard       Vision Patient Visual Report: Other (comment) (no pt report received) Vision Assessment?:  Vision impaired- to be further tested in functional context Additional Comments: likely decr peripheral vision as well as acuity     Perception Perception Perception Tested?: No   Praxis  Praxis Praxis tested?: Not tested    Pertinent Vitals/Pain Pain Assessment Pain Assessment: Faces Faces Pain Scale: No hurt Pain Intervention(s): Monitored during session     Hand Dominance     Extremity/Trunk Assessment Upper Extremity Assessment Upper Extremity Assessment: Generalized weakness   Lower Extremity Assessment Lower Extremity Assessment: Defer to PT evaluation   Cervical / Trunk Assessment Cervical / Trunk Assessment: Normal   Communication Communication Communication: No difficulties;Other (comment) (engages in pleasantly confused conversation)   Cognition Arousal/Alertness: Awake/alert Behavior During Therapy: WFL for tasks assessed/performed Overall Cognitive Status: No family/caregiver present to determine baseline cognitive functioning                                 General Comments: Noting history of advanced dementia; constant cueing during ADL but pleasant and attempting to follow commands.     General Comments  Pleasantly confused, following all commands/suggestions; noting unkept fingernails -- need a closer look and good handwashing    Exercises     Shoulder Instructions      Home Living Family/patient expects to be discharged to:: Private residence Living Arrangements: Spouse/significant other Available Help at Discharge: Family (husband is caregiver per chart review) Type of Home: House                           Additional Comments: Will need clarification of home environment and prior level of function as it becomes available      Prior Functioning/Environment Prior Level of Function : Needs assist  Cognitive Assist : Mobility (cognitive);ADLs (cognitive)                      OT Problem List: Decreased strength;Decreased activity tolerance;Impaired balance (sitting and/or standing);Decreased cognition;Decreased safety awareness;Decreased knowledge of use of DME or AE      OT  Treatment/Interventions: Self-care/ADL training;Therapeutic exercise;DME and/or AE instruction;Balance training;Patient/family education;Therapeutic activities    OT Goals(Current goals can be found in the care plan section) Acute Rehab OT Goals Patient Stated Goal: none OT Goal Formulation: With patient Time For Goal Achievement: 03/18/23 Potential to Achieve Goals: Good  OT Frequency: Min 2X/week    Co-evaluation PT/OT/SLP Co-Evaluation/Treatment: Yes Reason for Co-Treatment: To address functional/ADL transfers;Necessary to address cognition/behavior during functional activity;For patient/therapist safety PT goals addressed during session: Mobility/safety with mobility OT goals addressed during session: ADL's and self-care      AM-PAC OT "6 Clicks" Daily Activity     Outcome Measure Help from another person eating meals?: A Little Help from another person taking care of personal grooming?: A Little Help from another person toileting, which includes using toliet, bedpan, or urinal?: A Little Help from another person bathing (including washing, rinsing, drying)?: A Little Help from another person to put on and taking off regular upper body clothing?: A Little Help from another person to put on and taking off regular lower body clothing?: A Little 6 Click Score: 18   End of Session Equipment Utilized During Treatment: Gait belt Nurse Communication: Mobility status  Activity Tolerance: Patient tolerated treatment well Patient left: in chair;with call bell/phone within reach;with chair alarm set  OT Visit Diagnosis: Unsteadiness on feet (R26.81);Muscle weakness (generalized) (M62.81);Other abnormalities of gait  and mobility (R26.89);Other symptoms and signs involving cognitive function                Time: 1451-1517 OT Time Calculation (min): 26 min Charges:  OT General Charges $OT Visit: 1 Visit OT Evaluation $OT Eval Moderate Complexity: 1 Mod  Tyler Deis, OTR/L [facility]  Acute Rehabilitation Office: [phone]   [name] 03/04/2023, 5:32 PM

## 2023-03-04 NOTE — Plan of Care (Signed)

## 2023-03-05 DIAGNOSIS — D751 Secondary polycythemia: Secondary | ICD-10-CM

## 2023-03-05 DIAGNOSIS — J9601 Acute respiratory failure with hypoxia: Secondary | ICD-10-CM

## 2023-03-05 DIAGNOSIS — J441 Chronic obstructive pulmonary disease with (acute) exacerbation: Secondary | ICD-10-CM | POA: Diagnosis not present

## 2023-03-05 DIAGNOSIS — K219 Gastro-esophageal reflux disease without esophagitis: Secondary | ICD-10-CM

## 2023-03-05 DIAGNOSIS — N3001 Acute cystitis with hematuria: Secondary | ICD-10-CM | POA: Diagnosis not present

## 2023-03-05 DIAGNOSIS — I739 Peripheral vascular disease, unspecified: Secondary | ICD-10-CM

## 2023-03-05 DIAGNOSIS — G301 Alzheimer's disease with late onset: Secondary | ICD-10-CM

## 2023-03-05 DIAGNOSIS — W19XXXA Unspecified fall, initial encounter: Secondary | ICD-10-CM | POA: Diagnosis not present

## 2023-03-05 DIAGNOSIS — F17200 Nicotine dependence, unspecified, uncomplicated: Secondary | ICD-10-CM

## 2023-03-05 DIAGNOSIS — E785 Hyperlipidemia, unspecified: Secondary | ICD-10-CM

## 2023-03-05 DIAGNOSIS — N289 Disorder of kidney and ureter, unspecified: Secondary | ICD-10-CM

## 2023-03-05 DIAGNOSIS — D696 Thrombocytopenia, unspecified: Secondary | ICD-10-CM

## 2023-03-05 DIAGNOSIS — E034 Atrophy of thyroid (acquired): Secondary | ICD-10-CM

## 2023-03-05 DIAGNOSIS — E876 Hypokalemia: Secondary | ICD-10-CM

## 2023-03-05 DIAGNOSIS — I10 Essential (primary) hypertension: Secondary | ICD-10-CM

## 2023-03-05 DIAGNOSIS — Y92009 Unspecified place in unspecified non-institutional (private) residence as the place of occurrence of the external cause: Secondary | ICD-10-CM

## 2023-03-05 DIAGNOSIS — F02B0 Dementia in other diseases classified elsewhere, moderate, without behavioral disturbance, psychotic disturbance, mood disturbance, and anxiety: Secondary | ICD-10-CM

## 2023-03-05 LAB — BASIC METABOLIC PANEL
Anion gap: 7 (ref 5–15)
BUN: 30 mg/dL — ABNORMAL HIGH (ref 8–23)
CO2: 30 mmol/L (ref 22–32)
Calcium: 8.8 mg/dL — ABNORMAL LOW (ref 8.9–10.3)
Chloride: 102 mmol/L (ref 98–111)
Creatinine, Ser: 0.98 mg/dL (ref 0.44–1.00)
GFR, Estimated: 57 mL/min — ABNORMAL LOW (ref >60)
Glucose, Bld: 207 mg/dL — ABNORMAL HIGH (ref 70–99)
Potassium: 4.4 mmol/L (ref 3.5–5.1)
Sodium: 139 mmol/L (ref 135–145)

## 2023-03-05 LAB — CBC WITH DIFFERENTIAL/PLATELET
Abs Immature Granulocytes: 0.05 10*3/uL (ref 0.00–0.07)
Basophils Absolute: 0 10*3/uL (ref 0.0–0.1)
Basophils Relative: 0 %
Eosinophils Absolute: 0 10*3/uL (ref 0.0–0.5)
Eosinophils Relative: 0 %
HCT: 41 % (ref 36.0–46.0)
Hemoglobin: 13.8 g/dL (ref 12.0–15.0)
Immature Granulocytes: 1 %
Lymphocytes Relative: 22 %
Lymphs Abs: 2.1 10*3/uL (ref 0.7–4.0)
MCH: 29.6 pg (ref 26.0–34.0)
MCHC: 33.7 g/dL (ref 30.0–36.0)
MCV: 88 fL (ref 80.0–100.0)
Monocytes Absolute: 0.6 10*3/uL (ref 0.1–1.0)
Monocytes Relative: 6 %
Neutro Abs: 6.9 10*3/uL (ref 1.7–7.7)
Neutrophils Relative %: 71 %
Platelets: 114 10*3/uL — ABNORMAL LOW (ref 150–400)
RBC: 4.66 MIL/uL (ref 3.87–5.11)
RDW: 12.5 % (ref 11.5–15.5)
WBC: 9.7 10*3/uL (ref 4.0–10.5)
nRBC: 0 % (ref 0.0–0.2)

## 2023-03-05 LAB — URINE CULTURE: Culture: >=100000 — AB

## 2023-03-05 LAB — MAGNESIUM: Magnesium: 1.8 mg/dL (ref 1.7–2.4)

## 2023-03-05 MED ORDER — MAGNESIUM SULFATE 2 GM/50ML IV SOLN
2.0000 g | Freq: Once | INTRAVENOUS | Status: AC
  Administered 2023-03-05: 2 g via INTRAVENOUS
  Filled 2023-03-05: qty 50

## 2023-03-05 NOTE — Progress Notes (Signed)
Patients husband in 2C03, palliative team would like patient to come visit

## 2023-03-05 NOTE — Progress Notes (Signed)
Mobility Specialist Progress Note   03/05/23 1120  Mobility  Activity Ambulated with assistance in hallway;Ambulated with assistance to bathroom;Transferred from bed to chair  Level of Assistance Contact guard assist, steadying assist  Assistive Device Front wheel walker  Distance Ambulated (ft) 80 ft  Range of Motion/Exercises Active;All extremities  Activity Response Tolerated well   Patient received in supine, pleasantly confused and agreeable to participate. Family entered upon the start of session. Completed bed mobility with supervision. Discovered to be soiled with stool requiring pericare. Family requested female RN or NT to assist with pericare. Ambulated to bathroom min guard and was left in the care of NT. Patient then ambulated in hallway min guard with Clinical research associate. Returned to room without incident but complained of lightheadedness. Oxygen saturation 90% upon sitting in recliner for approximately 3 minutes. Assumed oxygen desaturated during ambulation. Was left in recliner with all needs met, call bell in reach.   [place] Ryden Wainer, BS EXP Mobility Specialist Please contact via SecureChat or Rehab office at [phone]

## 2023-03-05 NOTE — TOC Initial Note (Addendum)
Transition of Care [facility]) - Initial/Assessment Note    Patient Details  Name: [name] MRN: [id number] Date of Birth: Apr 14, 1936  Transition of Care [facility] CM/SW Contact:    Carmina Miller, LCSWA Phone Number: 03/05/2023, 1:19 PM  Clinical Narrative:                 Update-2:30 pm-spoke with pt's son [name], he is agreeable to pt going to SNF, states pt's spouse is on 2C, he fell and pt fell as well, down time around one day before being down. [name] wants pt to be closer to him in [place], CSW explained they would need to research SNFs in [place] and ensure there is availability then we would fax referral, [name] verbalized understanding. CSW sent Medicare.gov website via text and explained insurance auth process. [name] agreeable to faxing pt out locally as well. [name] states pt's home was in [place] and they have been cleaning it for the past three days. APS report made based off of EMS run sheet (hoarding situation, difficult for EMS to get in the home). CSW will work pt up and fax her out.     CSW received Epic chat from [facility], states pt's son and daughter in law would like to speak with CSW concerning SNF placement in the [place] area. By the time CSW arrived to the room they were gone. CSW attempted to call both, no answer. TOC will continue to follow.         Patient Goals and CMS Choice            Expected Discharge Plan and Services                                              Prior Living Arrangements/Services                       Activities of Daily Living      Permission Sought/Granted                  Emotional Assessment              Admission diagnosis:  Acute cystitis without hematuria [N30.00] Sepsis secondary to UTI (HCC) [A41.9, N39.0] Fall, initial encounter [W19.XXXA] Fall at home, initial encounter [phone].Lorne Skeens, Y92.009] Patient Active Problem List   Diagnosis Date Noted   Fall at  home, initial encounter 03/03/2023   Urinary tract infection with hematuria 03/03/2023   Acute respiratory failure with hypoxia (HCC) 03/03/2023   Leukocytosis 03/03/2023   Hypocalcemia 03/03/2023   Renal insufficiency 03/03/2023   Polycythemia 03/03/2023   Thrombocytopenia (HCC) 03/03/2023   Hyperglycemia 11/19/2022   Atherosclerosis of aorta (HCC) 02/01/2021   Diverticulitis 11/02/2020   Diarrhea 11/02/2020   Venous incompetence 06/03/2020   Orthostatic lightheadedness 10/09/2019   History of colonic polyps 08/10/2015   Lumbago 06/22/2015   Tobacco dependence 06/22/2015   Alzheimer's dementia (HCC) 06/23/2014   Enteritis 02/21/2014   Gastroenteritis 02/21/2014   Hypokalemia 09/24/2013   Actinic keratoses 11/25/2012   COPD with acute exacerbation (HCC)    Hypertension    Loss of weight 08/22/2011   Fatigue 08/22/2011   Benign neoplasm of colon 08/09/2011   Diverticulosis of colon (without mention of hemorrhage) 08/09/2011   Family history of malignant neoplasm of gastrointestinal tract 08/08/2011   Nausea & vomiting 08/08/2011  HAND PAIN 05/30/2010   SMOKER 08/03/2009   NEOPLASM, SKIN, UNCERTAIN BEHAVIOR 01/27/2009   Memory loss 01/27/2009   Edema 06/08/2008   HIP PAIN, RIGHT 06/04/2008   BACK PAIN 06/04/2008   OTHER MALAISE AND FATIGUE 11/18/2007   Prediabetes 11/18/2007   CHEST PAIN, UNSPECIFIED 10/22/2007   Acute bronchitis 10/07/2007   Dyslipidemia 07/24/2007   Osteoporosis 07/24/2007   Hypothyroidism 05/08/2007   Carotid artery stenosis 05/08/2007   PVD (peripheral vascular disease) (HCC) 05/08/2007   GERD (gastroesophageal reflux disease) 05/08/2007   MIGRAINES, HX OF 05/08/2007   PCP:  [name], MD Pharmacy:   [facility] DELIVERY - [address], [place] - [address] [place] [address] Phone: [phone] Fax: [phone]  [facility] [address] [place] - [address] AT [facility] [address] [place] [address] Phone: [phone] Fax: [phone]     Social Determinants of Health (SDOH) Social History: SDOH Screenings   Food Insecurity: No Food Insecurity (06/06/2022)  Housing: Low Risk  (06/06/2022)  Transportation Needs: No Transportation Needs (06/06/2022)  Alcohol Screen: Low Risk  (06/06/2022)  Depression (PHQ2-9): Low Risk  (11/16/2022)  Financial Resource Strain: Low Risk  (06/06/2022)  Physical Activity: Inactive (06/06/2022)  Social Connections: Socially Integrated (06/06/2022)  Stress: No Stress Concern Present (06/06/2022)  Tobacco Use: High Risk (11/16/2022)   SDOH Interventions:     Readmission Risk Interventions     No data to display

## 2023-03-05 NOTE — TOC Progression Note (Cosign Needed Addendum)
Transition of Care [facility]) - Progression Note    Patient Details  Name: [name] MRN: [id number] Date of Birth: Oct 21, 1935  Transition of Care [facility]) CM/SW Contact  Carmina Miller, [place] Phone Number: 03/05/2023, 4:02 PM  Clinical Narrative:     RE: [name] Date of Birth: 04-17-2036 Date: 03/05/23  Please be advised that the above-named patient will require a short-term nursing home stay - anticipated 30 days or less for rehabilitation and strengthening.  The plan is for return home.    Expected Discharge Plan: Skilled Nursing Facility Barriers to Discharge: Continued Medical Work up, Unsafe home situation  Expected Discharge Plan and Services In-house Referral: Clinical Social Work     Living arrangements for the past 2 months: Single Family Home                                       Social Determinants of Health (SDOH) Interventions SDOH Screenings   Food Insecurity: No Food Insecurity (06/06/2022)  Housing: Low Risk  (06/06/2022)  Transportation Needs: No Transportation Needs (06/06/2022)  Alcohol Screen: Low Risk  (06/06/2022)  Depression (PHQ2-9): Low Risk  (11/16/2022)  Financial Resource Strain: Low Risk  (06/06/2022)  Physical Activity: Inactive (06/06/2022)  Social Connections: Socially Integrated (06/06/2022)  Stress: No Stress Concern Present (06/06/2022)  Tobacco Use: High Risk (11/16/2022)    Readmission Risk Interventions     No data to display

## 2023-03-05 NOTE — Progress Notes (Signed)
DSS provided number and would like social worker to get in contact with them.   Dorene Sorrow Ufot, MA  [phone] [email or web address]

## 2023-03-05 NOTE — [place] FL2 (Signed)
[place] MEDICAID FL2 LEVEL OF CARE FORM     IDENTIFICATION  Patient Name: [name] Birthdate: 07/10/1936 Sex: female Admission Date (Current Location): 03/02/2023  [facility] and [place] Number:  Producer, television/film/video and Address:  [facility] [place], [address] [place], [place] [address]      Provider Number: [id number]  Attending Physician Name and Address:  Rodolph Bong, MD  Relative Name and Phone Number:  Jeri Campione   [phone]    Current Level of Care: Hospital Recommended Level of Care: Skilled Nursing Facility Prior Approval Number:    Date Approved/Denied:   PASRR Number: Pending  Discharge Plan: SNF    Current Diagnoses: Patient Active Problem List   Diagnosis Date Noted   Fall at home, initial encounter 03/03/2023   Urinary tract infection with hematuria 03/03/2023   Acute respiratory failure with hypoxia (HCC) 03/03/2023   Leukocytosis 03/03/2023   Hypocalcemia 03/03/2023   Renal insufficiency 03/03/2023   Polycythemia 03/03/2023   Thrombocytopenia (HCC) 03/03/2023   Hyperglycemia 11/19/2022   Atherosclerosis of aorta (HCC) 02/01/2021   Diverticulitis 11/02/2020   Diarrhea 11/02/2020   Venous incompetence 06/03/2020   Orthostatic lightheadedness 10/09/2019   History of colonic polyps 08/10/2015   Lumbago 06/22/2015   Tobacco dependence 06/22/2015   Alzheimer's dementia (HCC) 06/23/2014   Enteritis 02/21/2014   Gastroenteritis 02/21/2014   Hypokalemia 09/24/2013   Actinic keratoses 11/25/2012   COPD with acute exacerbation (HCC)    Hypertension    Loss of weight 08/22/2011   Fatigue 08/22/2011   Benign neoplasm of colon 08/09/2011   Diverticulosis of colon (without mention of hemorrhage) 08/09/2011   Family history of malignant neoplasm of gastrointestinal tract 08/08/2011   Nausea & vomiting 08/08/2011   HAND PAIN 05/30/2010   SMOKER 08/03/2009   NEOPLASM, SKIN, UNCERTAIN BEHAVIOR 01/27/2009    Memory loss 01/27/2009   Edema 06/08/2008   HIP PAIN, RIGHT 06/04/2008   BACK PAIN 06/04/2008   OTHER MALAISE AND FATIGUE 11/18/2007   Prediabetes 11/18/2007   CHEST PAIN, UNSPECIFIED 10/22/2007   Acute bronchitis 10/07/2007   Dyslipidemia 07/24/2007   Osteoporosis 07/24/2007   Hypothyroidism 05/08/2007   Carotid artery stenosis 05/08/2007   PVD (peripheral vascular disease) (HCC) 05/08/2007   GERD (gastroesophageal reflux disease) 05/08/2007   MIGRAINES, HX OF 05/08/2007    Orientation RESPIRATION BLADDER Height & Weight      (Fluctuating)  Normal Incontinent, External catheter Weight: 132 lb 11.5 oz (60.2 kg) Height:     BEHAVIORAL SYMPTOMS/MOOD NEUROLOGICAL BOWEL NUTRITION STATUS      Continent Diet (see dc summary)  AMBULATORY STATUS COMMUNICATION OF NEEDS Skin   Limited Assist Verbally Normal                       Personal Care Assistance Level of Assistance  Bathing, Feeding, Dressing Bathing Assistance: Limited assistance Feeding assistance: Independent Dressing Assistance: Limited assistance     Functional Limitations Info  Sight, Hearing, Speech Sight Info: Adequate Hearing Info: Adequate Speech Info: Adequate    SPECIAL CARE FACTORS FREQUENCY  PT (By licensed PT), OT (By licensed OT)     PT Frequency: 5x week OT Frequency: 5x week            Contractures Contractures Info: Not present    Additional Factors Info  Code Status, Allergies Code Status Info: Full Allergies Info: Codeine Sulfate  Exelon (Rivastigmine Tartrate)  Oxycodone-acetaminophen  Current Medications (03/05/2023):  This is the current hospital active medication list Current Facility-Administered Medications  Medication Dose Route Frequency Provider Last Rate Last Admin   acetaminophen (TYLENOL) tablet 650 mg  650 mg Oral Q6H PRN Madelyn Flavors A, MD   650 mg at 03/04/23 2103   Or   acetaminophen (TYLENOL) suppository 650 mg  650 mg Rectal Q6H PRN Clydie Braun, MD       aspirin EC tablet 81 mg  81 mg Oral Daily Elayne Snare K, DO   81 mg at 03/05/23 0857   budesonide (PULMICORT) nebulizer solution 0.5 mg  0.5 mg Nebulization BID Rodolph Bong, MD   0.5 mg at 03/05/23 8469   cefTRIAXone (ROCEPHIN) 2 g in sodium chloride 0.9 % 100 mL IVPB  2 g Intravenous Q24H Rodolph Bong, MD 200 mL/hr at 03/05/23 0528 2 g at 03/05/23 0528   cholecalciferol (VITAMIN D3) 25 MCG (1000 UNIT) tablet 2,000 Units  2,000 Units Oral Daily Elayne Snare K, DO   2,000 Units at 03/05/23 0857   donepezil (ARICEPT) tablet 10 mg  10 mg Oral QHS Kingsley, Benetta Spar K, DO   10 mg at 03/04/23 2103   enoxaparin (LOVENOX) injection 40 mg  40 mg Subcutaneous Q24H Smith, Rondell A, MD   40 mg at 03/05/23 1454   fluticasone (FLONASE) 50 MCG/ACT nasal spray 2 spray  2 spray Each Nare Daily Rodolph Bong, MD   2 spray at 03/05/23 0858   furosemide (LASIX) tablet 40 mg  40 mg Oral Daily Rodolph Bong, MD   40 mg at 03/05/23 0857   ipratropium-albuterol (DUONEB) 0.5-2.5 (3) MG/3ML nebulizer solution 3 mL  3 mL Nebulization Q2H PRN Rodolph Bong, MD       ipratropium-albuterol (DUONEB) 0.5-2.5 (3) MG/3ML nebulizer solution 3 mL  3 mL Nebulization BID Rodolph Bong, MD   3 mL at 03/05/23 6295   levothyroxine (SYNTHROID) tablet 50 mcg  50 mcg Oral QAC breakfast Elayne Snare K, DO   50 mcg at 03/05/23 0528   loratadine (CLARITIN) tablet 10 mg  10 mg Oral Daily Rodolph Bong, MD   10 mg at 03/05/23 0857   losartan (COZAAR) tablet 50 mg  50 mg Oral Daily Elayne Snare K, DO   50 mg at 03/05/23 0857   magnesium sulfate IVPB 2 g 50 mL  2 g Intravenous Once Rodolph Bong, MD 50 mL/hr at 03/05/23 1538 2 g at 03/05/23 1538   memantine (NAMENDA) tablet 10 mg  10 mg Oral BID Elayne Snare K, DO   10 mg at 03/05/23 2841   multivitamin with minerals tablet 1 tablet  1 tablet Oral Daily Elayne Snare K, DO   1 tablet at 03/05/23 0857    nicotine (NICODERM CQ - dosed in mg/24 hours) patch 21 mg  21 mg Transdermal Q0600 Madelyn Flavors A, MD   21 mg at 03/05/23 0529   ondansetron (ZOFRAN) tablet 4 mg  4 mg Oral Q6H PRN Madelyn Flavors A, MD       Or   ondansetron (ZOFRAN) injection 4 mg  4 mg Intravenous Q6H PRN Madelyn Flavors A, MD   4 mg at 03/04/23 0734   pantoprazole (PROTONIX) EC tablet 40 mg  40 mg Oral Daily Madelyn Flavors A, MD   40 mg at 03/05/23 0857   predniSONE (DELTASONE) tablet 40 mg  40 mg Oral Q breakfast Madelyn Flavors A, MD   40 mg at 03/05/23  0857   simvastatin (ZOCOR) tablet 40 mg  40 mg Oral q1800 Elayne Snare K, DO   40 mg at 03/04/23 1617   sodium chloride flush (NS) 0.9 % injection 3 mL  3 mL Intravenous Q12H Madelyn Flavors A, MD   3 mL at 03/04/23 2104     Discharge Medications: Please see discharge summary for a list of discharge medications.  Relevant Imaging Results:  Relevant Lab Results:   Additional Information SSN [id number]  Carmina Miller, LCSWA

## 2023-03-05 NOTE — Progress Notes (Signed)
PROGRESS NOTE    [name]  WGN:[id number] DOB: 30-Sep-1936 DOA: 03/02/2023 PCP: [name], MD    Chief Complaint  Patient presents with   Fall    Brief Narrative: Patient 87 year old female history of hypertension, hyperlipidemia, COPD, severe dementia, hypothyroidism, tobacco abuse, GERD presented to ED after being found down at home.  In the ED patient noted to be hypoxic with sats of 70% on room air improved on 2 L nasal cannula, noted to have a leukocytosis, urinalysis concerning for UTI.  Imaging studies negative.  Patient admitted for UTI, falls, acute COPD exacerbation.  Patient placed empirically on IV antibiotics.   Assessment & Plan:   Principal Problem:   Fall at home, initial encounter Active Problems:   Urinary tract infection with hematuria   COPD with acute exacerbation (HCC)   Acute respiratory failure with hypoxia (HCC)   Leukocytosis   Hypokalemia   Hypocalcemia   Hypertension   Renal insufficiency   Polycythemia   Thrombocytopenia (HCC)   Hypothyroidism   Alzheimer's dementia (HCC)   PVD (peripheral vascular disease) (HCC)   Dyslipidemia   Tobacco dependence   GERD (gastroesophageal reflux disease)  #1 falls -Patient noted to have a fall at home both patient and husband found down on the floor. -Likely secondary to UTI. -CT head /CT C-spine negative for any acute abnormalities. -Chest x-ray plain films of the pelvis unremarkable. -PT/OT. -Patient likely needs placement as patient's husband is main caregiver and currently also hospitalized. -TOC consulted for placement..  2.  Klebsiella pneumonia UTI with hematuria -Patient noted to be found down with her husband at home, urinalysis with large leukocytes, positive nitrites, many bacteria, 11-20 RBCs, WBC > 50. -Urine cultures with  > 100,000 colonies of Klebsiella pneumoniae resistant to ampicillin however sensitive to cephalosporins, fluoroquinolones, gentamicin, imipenem,  Macrobid, Bactrim, Zosyn, Unasyn. -Continue IV Rocephin to complete a 5-day course of treatment. -Continue empiric IV Rocephin. -Supportive care.  3.  Acute respiratory failure with hypoxia/acute COPD exacerbation -Patient noted on presentation to be hypoxic with sats of 70% on room air and improved on 2 L nasal cannula. -Patient noted not chronically on home O2. -Patient on presentation with diffuse expiratory wheezing and treated for acute COPD exacerbation. -Continue Pulmicort twice daily, scheduled DuoNebs, Claritin, Flonase. -Continue prednisone taper, PPI. -Wean O2.  4.  Hypokalemia/hypocalcemia -Status post IV calcium gluconate. -Corrected calcium approximately 9.6. -Potassium repleted. -Magnesium noted at 1.8. -Repeat labs in the AM.  5.  Hypertension -BP stable. -Continue Lasix, Cozaar.   6.  GERD -PPI.  7.  Renal insufficiency -Patient noted on admission to have a creatinine of 1.09 BUN of 20 with baseline creatinine about 0.9. -Creatinine currently at 0.98. -Status post IV fluids.  8.  Polycythemia -Chronic. -Felt secondary to patient's history of tobacco use. -Some improvement with hemoglobin currently at 13.8.  9.  Chronic thrombocytopenia -Platelet count was initially trending down and trending back up.  -Felt likely secondary to acute infection.   -Follow.  10.  Hyperlipidemia -Statin.  11.  Dementia -Patient with advanced dementia, normally cared for by her husband. -Aricept, Namenda.   -Delirium precautions.   12.  Tobacco abuse -Per admitting physician patient's son notes patient smokes about a pack of cigarettes per day on average and done so for several years. -Continue nicotine patch.  13.  Peripheral vascular disease -Aspirin, statin.    DVT prophylaxis: Lovenox  code Status: Full Family Communication: Updated son and daughter at bedside. Disposition: Likely needs  SNF/patient with history of dementia.  Husband is only caregiver who is  currently hospitalized.  Status is: Inpatient Remains inpatient appropriate because: Severity of illness   Consultants:  None  Procedures: CT head CT C-spine 03/02/2023 Chest x-ray 03/02/2023 Plain films of the pelvis 03/02/2023  Antimicrobials:  Anti-infectives (From admission, onward)    Start     Dose/Rate Route Frequency Ordered Stop   03/04/23 0500  cefTRIAXone (ROCEPHIN) 2 g in sodium chloride 0.9 % 100 mL IVPB        2 g 200 mL/hr over 30 Minutes Intravenous Every 24 hours 03/03/23 0804     03/03/23 0500  cefTRIAXone (ROCEPHIN) 2 g in sodium chloride 0.9 % 100 mL IVPB        2 g 200 mL/hr over 30 Minutes Intravenous  Once 03/03/23 0458 03/03/23 0618         Subjective: Sitting up in chair.  Denies any chest pain.  No shortness of breath.  No abdominal pain.  Pleasantly confused.  Son and daughter at bedside.   Objective: Vitals:   03/04/23 2051 03/05/23 0439 03/05/23 0804 03/05/23 0830  BP:  (!) 146/63 (!) 157/79   Pulse:  68 79   Resp:  17 18   Temp:  98.5 F (36.9 C) 98.6 F (37 C)   TempSrc:   Oral   SpO2: 94%  95% 95%    Intake/Output Summary (Last 24 hours) at 03/05/2023 1214 Last data filed at 03/05/2023 0500 Gross per 24 hour  Intake 240 ml  Output --  Net 240 ml    There were no vitals filed for this visit.  Examination:  General exam: NAD. Respiratory system: CTAB.  No wheezes, no crackles, no rhonchi.  Fair air movement.  Cardiovascular system: Regular rate rhythm no murmurs rubs or gallops.  No JVD.  No lower extremity edema.  Gastrointestinal system: Abdomen is soft, nondistended, nontender, soft, positive bowel sounds.  Central nervous system: Alert and oriented to self only. No focal neurological deficits. Extremities: Symmetric 5 x 5 power. Skin: No rashes, lesions or ulcers Psychiatry: Judgement and insight appear normal. Mood & affect appropriate.     Data Reviewed: I have personally reviewed following labs and imaging  studies  CBC: Recent Labs  Lab 03/02/23 2023 03/04/23 0209 03/05/23 0303  WBC 11.1* 6.8 9.7  NEUTROABS 8.7*  --  6.9  HGB 16.2* 13.9 13.8  HCT 48.5* 41.5 41.0  MCV 89.5 87.9 88.0  PLT 131* 101* 114*     Basic Metabolic Panel: Recent Labs  Lab 03/02/23 2225 03/02/23 2336 03/03/23 1442 03/04/23 0209 03/05/23 0303  NA 142  --  138 140 139  K 3.0*  --  3.0* 3.9 4.4  CL 104  --  105 104 102  CO2 25  --  25 27 30   GLUCOSE 168*  --  195* 204* 207*  BUN 20  --  18 19 30*  CREATININE 1.09*  --  0.83 0.81 0.98  CALCIUM 8.1*  --  8.7* 8.3* 8.8*  MG  --  1.9  --   --  1.8     GFR: CrCl cannot be calculated (Unknown ideal weight.).  Liver Function Tests: Recent Labs  Lab 03/02/23 2225 03/04/23 0209  AST 53* 37  ALT 36 33  ALKPHOS 57 53  BILITOT 1.9* 1.0  PROT 6.1* 5.4*  ALBUMIN 3.4* 3.0*     CBG: No results for input(s): "GLUCAP" in the last 168 hours.   Recent  Results (from the past 240 hour(s))  Urine Culture     Status: Abnormal   Collection Time: 03/03/23  3:40 AM   Specimen: Urine, Random  Result Value Ref Range Status   Specimen Description URINE, RANDOM  Final   Special Requests   Final    NONE Reflexed from [id number] Performed at [facility], [address], [place], [place] [address]    Culture >=100,000 COLONIES/mL KLEBSIELLA PNEUMONIAE (A)  Final   Report Status 03/05/2023 FINAL  Final   Organism ID, Bacteria KLEBSIELLA PNEUMONIAE (A)  Final      Susceptibility   Klebsiella pneumoniae - MIC*    AMPICILLIN RESISTANT Resistant     CEFAZOLIN <=4 SENSITIVE Sensitive     CEFEPIME <=0.12 SENSITIVE Sensitive     CEFTRIAXONE <=0.25 SENSITIVE Sensitive     CIPROFLOXACIN <=0.25 SENSITIVE Sensitive     GENTAMICIN <=1 SENSITIVE Sensitive     IMIPENEM <=0.25 SENSITIVE Sensitive     NITROFURANTOIN <=16 SENSITIVE Sensitive     TRIMETH/SULFA <=20 SENSITIVE Sensitive     AMPICILLIN/SULBACTAM 4 SENSITIVE Sensitive     PIP/TAZO <=4 SENSITIVE Sensitive      * >=100,000 COLONIES/mL KLEBSIELLA PNEUMONIAE         Radiology Studies: No results found.      Scheduled Meds:  aspirin EC  81 mg Oral Daily   budesonide (PULMICORT) nebulizer solution  0.5 mg Nebulization BID   cholecalciferol  2,000 Units Oral Daily   donepezil  10 mg Oral QHS   enoxaparin (LOVENOX) injection  40 mg Subcutaneous Q24H   fluticasone  2 spray Each Nare Daily   furosemide  40 mg Oral Daily   ipratropium-albuterol  3 mL Nebulization BID   levothyroxine  50 mcg Oral QAC breakfast   loratadine  10 mg Oral Daily   losartan  50 mg Oral Daily   memantine  10 mg Oral BID   multivitamin with minerals  1 tablet Oral Daily   nicotine  21 mg Transdermal Q0600   pantoprazole  40 mg Oral Daily   predniSONE  40 mg Oral Q breakfast   simvastatin  40 mg Oral q1800   sodium chloride flush  3 mL Intravenous Q12H   Continuous Infusions:  cefTRIAXone (ROCEPHIN)  IV 2 g (03/05/23 0528)     LOS: 2 days    Time spent: 35 minutes    Ramiro Harvest, MD [facility]   To contact the attending provider between 7A-7P or the covering provider during after hours 7P-7A, please log into the web site [email or web address] and access using universal [place] password for that web site. If you do not have the password, please call the hospital operator.  03/05/2023, 12:14 PM

## 2023-03-06 DIAGNOSIS — W19XXXA Unspecified fall, initial encounter: Secondary | ICD-10-CM | POA: Diagnosis not present

## 2023-03-06 DIAGNOSIS — N3001 Acute cystitis with hematuria: Secondary | ICD-10-CM | POA: Diagnosis not present

## 2023-03-06 DIAGNOSIS — J441 Chronic obstructive pulmonary disease with (acute) exacerbation: Secondary | ICD-10-CM | POA: Diagnosis not present

## 2023-03-06 DIAGNOSIS — J9601 Acute respiratory failure with hypoxia: Secondary | ICD-10-CM | POA: Diagnosis not present

## 2023-03-06 LAB — BASIC METABOLIC PANEL
Anion gap: 13 (ref 5–15)
BUN: 27 mg/dL — ABNORMAL HIGH (ref 8–23)
CO2: 27 mmol/L (ref 22–32)
Calcium: 8.7 mg/dL — ABNORMAL LOW (ref 8.9–10.3)
Chloride: 97 mmol/L — ABNORMAL LOW (ref 98–111)
Creatinine, Ser: 0.98 mg/dL (ref 0.44–1.00)
GFR, Estimated: 57 mL/min — ABNORMAL LOW (ref >60)
Glucose, Bld: 151 mg/dL — ABNORMAL HIGH (ref 70–99)
Potassium: 4 mmol/L (ref 3.5–5.1)
Sodium: 137 mmol/L (ref 135–145)

## 2023-03-06 LAB — CBC
HCT: 47.4 % — ABNORMAL HIGH (ref 36.0–46.0)
Hemoglobin: 15.9 g/dL — ABNORMAL HIGH (ref 12.0–15.0)
MCH: 29.9 pg (ref 26.0–34.0)
MCHC: 33.5 g/dL (ref 30.0–36.0)
MCV: 89.3 fL (ref 80.0–100.0)
Platelets: 131 10*3/uL — ABNORMAL LOW (ref 150–400)
RBC: 5.31 MIL/uL — ABNORMAL HIGH (ref 3.87–5.11)
RDW: 12.5 % (ref 11.5–15.5)
WBC: 10.7 10*3/uL — ABNORMAL HIGH (ref 4.0–10.5)
nRBC: 0 % (ref 0.0–0.2)

## 2023-03-06 LAB — MAGNESIUM: Magnesium: 2.2 mg/dL (ref 1.7–2.4)

## 2023-03-06 NOTE — TOC Progression Note (Addendum)
Transition of Care [facility] - Progression Note    Patient Details  Name: [name] MRN: [id number] Date of Birth: April 26, 1936  Transition of [facility]) CM/SW Contact  Kelly Frederick, [name] Phone Number: 03/06/2023, 1:02 PM  Clinical Narrative:   Pt has husband also hospitalized and needing SNF at [facility].  CSW coordinating with CSW Modena Morrow as son wants them to go to same SNF if possible.  Jess provided bed offers to son [name], who chooses [facility].  CSW spoke to [facility], who can accept pt tomorrow.  CSW spoke with son [name], confirmed [facility] with plan for DC tomorrow.  He plans to come to [place] from [place] in the AM with clothing.    Additional info for passr uploaded to [place] must.  1600: PASSR received: [id number]    Expected Discharge Plan: Skilled Nursing Facility Barriers to Discharge: Continued Medical Work up, Unsafe home situation  Expected Discharge Plan and Services In-house Referral: Clinical Social Work     Living arrangements for the past 2 months: Single Family Home                                       Social Determinants of Health (SDOH) Interventions SDOH Screenings   Food Insecurity: No Food Insecurity (06/06/2022)  Housing: Low Risk  (06/06/2022)  Transportation Needs: No Transportation Needs (06/06/2022)  Alcohol Screen: Low Risk  (06/06/2022)  Depression (PHQ2-9): Low Risk  (11/16/2022)  Financial Resource Strain: Low Risk  (06/06/2022)  Physical Activity: Inactive (06/06/2022)  Social Connections: Socially Integrated (06/06/2022)  Stress: No Stress Concern Present (06/06/2022)  Tobacco Use: High Risk (11/16/2022)    Readmission Risk Interventions     No data to display

## 2023-03-06 NOTE — Progress Notes (Signed)
Physical Therapy Treatment Patient Details Name: [name] MRN: [id number] DOB: [birth year] Today's Date: 03/06/2023   History of Present Illness Pt is an 87 y.o. female presenting after being found down on home for unknown length of time (husband found down as well). otable bruising R side of face; Husband is her caregiver and is currently admitted as well. X-rays chest and pelvis no acute findings. CT head negative for acute findings. Urinalysis positive for large leukocytes and nitrites. PMH significant for hypertension, hyperlipidemia, COPD, hypothyroidism, severe dementia, tobacco abuse, and GERD.    PT Comments    Pt received in supine and agreeable to session. Pt pleasant throughout, but nonsensical at times and requiring safety cues. Pt demonstrating improved balance and activity tolerance this session. Pt able to tolerate increased gait distance with no unsteadiness noted. When returning to the recliner, pt leaving the RW aside and walking a short distance with no AD despite cues. Pt able to stand from recliner x3 without UE support with no unsteadiness noted. Pt continues to benefit from PT services to progress toward functional mobility goals.    Recommendations for follow up therapy are one component of a multi-disciplinary discharge planning process, led by the attending physician.  Recommendations may be updated based on patient status, additional functional criteria and insurance authorization.     Assistance Recommended at Discharge Frequent or constant Supervision/Assistance  Patient can return home with the following A little help with walking and/or transfers   Equipment Recommendations  Rolling walker (2 wheels)    Recommendations for Other Services       Precautions / Restrictions Precautions Precautions: Fall Restrictions Weight Bearing Restrictions: No     Mobility  Bed Mobility               General bed mobility comments: Pt beginning and  ending session in recliner    Transfers Overall transfer level: Needs assistance Equipment used: Rolling walker (2 wheels), None Transfers: Sit to/from Stand Sit to Stand: Min guard           General transfer comment: From recliner x5 and bench x1 with pt demonstrating good power up without UE support. Pt standing to the RW and without the RW with no LOB or unsteadiness. Pt with one unsuccessful attempt, however quickly stood with improved anterior lean.    Ambulation/Gait Ambulation/Gait assistance: Min guard, Supervision Gait Distance (Feet): 105 Feet Assistive device: Rolling walker (2 wheels) Gait Pattern/deviations: Step-through pattern, Decreased stride length       General Gait Details: Pt demonstrating steady step-through pattern throughout. Pt requiring cues for safety when returning to the recliner due to pt leaving the RW behind despite cues.      Balance Overall balance assessment: Needs assistance Sitting-balance support: No upper extremity supported, Feet supported Sitting balance-Leahy Scale: Good Sitting balance - Comments: sitting in recliner   Standing balance support: Bilateral upper extremity supported, During functional activity Standing balance-Leahy Scale: Fair Standing balance comment: with RW support                            Cognition Arousal/Alertness: Awake/alert Behavior During Therapy: WFL for tasks assessed/performed Overall Cognitive Status: No family/caregiver present to determine baseline cognitive functioning                                 General Comments: Pt very pleasant throughout session, but requires  cues for safety. Pt repeating statement and nonsensical at times        Exercises General Exercises - Lower Extremity Long Arc Quad: AROM, Seated, Both, 10 reps    General Comments        Pertinent Vitals/Pain Pain Assessment Pain Assessment: Faces Faces Pain Scale: Hurts a little bit Pain  Location: Pt does not specify Pain Descriptors / Indicators: Aching Pain Intervention(s): Monitored during session     PT Goals (current goals can now be found in the care plan section) Acute Rehab PT Goals Patient Stated Goal: Agreeable to getting to bathroom PT Goal Formulation: Patient unable to participate in goal setting Time For Goal Achievement: 03/18/23 Potential to Achieve Goals: Fair Progress towards PT goals: Progressing toward goals    Frequency    Min 2X/week      PT Plan Current plan remains appropriate       AM-PAC PT "6 Clicks" Mobility   Outcome Measure  Help needed turning from your back to your side while in a flat bed without using bedrails?: None Help needed moving from lying on your back to sitting on the side of a flat bed without using bedrails?: A Little Help needed moving to and from a bed to a chair (including a wheelchair)?: A Little Help needed standing up from a chair using your arms (e.g., wheelchair or bedside chair)?: A Little Help needed to walk in hospital room?: A Little Help needed climbing 3-5 steps with a railing? : A Little 6 Click Score: 19    End of Session Equipment Utilized During Treatment: Gait belt Activity Tolerance: Patient tolerated treatment well Patient left: in chair;with call bell/phone within reach;with chair alarm set Nurse Communication: Mobility status PT Visit Diagnosis: Unsteadiness on feet (R26.81)     Time: 0981-1914 PT Time Calculation (min) (ACUTE ONLY): 17 min  Charges:  $Gait Training: 8-22 mins                     Johny Shock, PTA Acute Rehabilitation Services Secure Chat Preferred  [phone]    Johny Shock 03/06/2023, 11:07 AM

## 2023-03-06 NOTE — TOC Progression Note (Signed)
Transition of Care [facility]) - Progression Note    Patient Details  Name: [name] MRN: [id number] Date of Birth: 1936-06-12  Transition of Care [facility]) CM/SW Contact  Dannielle Karvonen Phone Number: 03/06/2023, 8:42 AM  Clinical Narrative:     CSW received phone call from CPS SW [place], she states report was accepted but could not provide me with the assigned CPS SW name or response time.   Expected Discharge Plan: Skilled Nursing Facility Barriers to Discharge: Continued Medical Work up, Unsafe home situation  Expected Discharge Plan and Services In-house Referral: Clinical Social Work     Living arrangements for the past 2 months: Single Family Home                                       Social Determinants of Health (SDOH) Interventions SDOH Screenings   Food Insecurity: No Food Insecurity (06/06/2022)  Housing: Low Risk  (06/06/2022)  Transportation Needs: No Transportation Needs (06/06/2022)  Alcohol Screen: Low Risk  (06/06/2022)  Depression (PHQ2-9): Low Risk  (11/16/2022)  Financial Resource Strain: Low Risk  (06/06/2022)  Physical Activity: Inactive (06/06/2022)  Social Connections: Socially Integrated (06/06/2022)  Stress: No Stress Concern Present (06/06/2022)  Tobacco Use: High Risk (11/16/2022)    Readmission Risk Interventions     No data to display

## 2023-03-06 NOTE — Plan of Care (Signed)
  Problem: Activity: Goal: Risk for activity intolerance will decrease Outcome: Progressing   Problem: Elimination: Goal: Will not experience complications related to urinary retention Outcome: Progressing   Problem: Pain Managment: Goal: General experience of comfort will improve Outcome: Progressing   Problem: Nutrition: Goal: Adequate nutrition will be maintained Outcome: Not Progressing

## 2023-03-06 NOTE — Plan of Care (Signed)

## 2023-03-06 NOTE — Plan of Care (Signed)
  Problem: Clinical Measurements: Goal: Will remain free from infection Outcome: Progressing   Problem: Coping: Goal: Level of anxiety will decrease Outcome: Progressing   Problem: Safety: Goal: Ability to remain free from injury will improve Outcome: Progressing   Problem: Skin Integrity: Goal: Risk for impaired skin integrity will decrease Outcome: Progressing   

## 2023-03-06 NOTE — Progress Notes (Addendum)
PROGRESS NOTE    [name]  ZOX:[id number] DOB: June 14, 1936 DOA: 03/02/2023 PCP: [name], MD    Chief Complaint  Patient presents with   Fall    Brief Narrative: Patient 87 year old female history of hypertension, hyperlipidemia, COPD, severe dementia, hypothyroidism, tobacco abuse, GERD presented to ED after being found down at home.  In the ED patient noted to be hypoxic with sats of 70% on room air improved on 2 L nasal cannula, noted to have a leukocytosis, urinalysis concerning for UTI.  Imaging studies negative.  Patient admitted for UTI, falls, acute COPD exacerbation.  Patient placed empirically on IV antibiotics.   Assessment & Plan:   Principal Problem:   Fall at home, initial encounter Active Problems:   Urinary tract infection with hematuria   COPD with acute exacerbation (HCC)   Acute respiratory failure with hypoxia (HCC)   Leukocytosis   Hypokalemia   Hypocalcemia   Hypertension   Renal insufficiency   Polycythemia   Thrombocytopenia (HCC)   Hypothyroidism   Alzheimer's dementia (HCC)   PVD (peripheral vascular disease) (HCC)   Dyslipidemia   Tobacco dependence   GERD (gastroesophageal reflux disease)  #1 falls -Patient noted to have a fall at home both patient and husband found down on the floor. -Likely secondary to UTI. -CT head /CT C-spine negative for any acute abnormalities. -Chest x-ray plain films of the pelvis unremarkable. -PT/OT. -Patient likely needs placement as patient's husband is main caregiver and currently also hospitalized. -TOC consulted for placement..  2.  Klebsiella pneumonia UTI with hematuria -Patient noted to be found down with her husband at home, urinalysis with large leukocytes, positive nitrites, many bacteria, 11-20 RBCs, WBC > 50. -Urine cultures with  > 100,000 colonies of Klebsiella pneumoniae resistant to ampicillin however sensitive to cephalosporins, fluoroquinolones, gentamicin, imipenem,  Macrobid, Bactrim, Zosyn, Unasyn. -Continue IV Rocephin to complete a 5-day course of treatment. -Supportive care.  3.  Acute respiratory failure with hypoxia/acute COPD exacerbation -Patient noted on presentation to be hypoxic with sats of 70% on room air and improved on 2 L nasal cannula. -Patient noted not chronically on home O2. -Patient on presentation with diffuse expiratory wheezing and treated for acute COPD exacerbation. -Respiratory status has improved currently 97% on room air. -Continue Pulmicort twice daily, scheduled DuoNebs, Claritin, Flonase. -Decrease prednisone to 30 mg daily and continue prednisone taper.  PPI.  4.  Hypokalemia/hypocalcemia -Status post IV calcium gluconate. -Corrected calcium approximately 9.5. -Potassium repleted.   -Magnesium repleted and currently at 2.2.  -Repeat labs in the AM.  5.  Hypertension -BP stable. -Continue Lasix, Cozaar.   6.  GERD -Continue PPI.   7.  Renal insufficiency -Patient noted on admission to have a creatinine of 1.09 BUN of 20 with baseline creatinine about 0.9. -Creatinine currently at 0.98. -Was on IV fluids that have been discontinued.  8.  Polycythemia -Chronic. -Felt secondary to patient's history of tobacco use.  9.  Chronic thrombocytopenia -Platelet count was initially trending down and trending back up.  -Felt likely secondary to acute infection.   -Follow.  10.  Hyperlipidemia -Continue statin.   11.  Dementia -Patient with advanced dementia, normally cared for by her husband.   -Continue Aricept, Namenda.   -Delirium precautions.   12.  Tobacco abuse -Per admitting physician patient's son notes patient smokes about a pack of cigarettes per day on average and done so for several years. -Nicotine patch.   13.  Peripheral vascular disease -Continue aspirin, statin.  DVT prophylaxis: Lovenox  code Status: Full Family Communication: Updated son and daughter at bedside. Disposition:  Likely needs SNF/patient with history of dementia.  Husband is only caregiver who is currently hospitalized.  Patient to SNF tomorrow if bed available.  Status is: Inpatient Remains inpatient appropriate because: Severity of illness/unstable disposition.   Consultants:  None  Procedures: CT head CT C-spine 03/02/2023 Chest x-ray 03/02/2023 Plain films of the pelvis 03/02/2023  Antimicrobials:  Anti-infectives (From admission, onward)    Start     Dose/Rate Route Frequency Ordered Stop   03/04/23 0500  cefTRIAXone (ROCEPHIN) 2 g in sodium chloride 0.9 % 100 mL IVPB        2 g 200 mL/hr over 30 Minutes Intravenous Every 24 hours 03/03/23 0804 03/07/23 2359   03/03/23 0500  cefTRIAXone (ROCEPHIN) 2 g in sodium chloride 0.9 % 100 mL IVPB        2 g 200 mL/hr over 30 Minutes Intravenous  Once 03/03/23 0458 03/03/23 0618         Subjective: Coming out of the restroom, nurse tech combing patient's hair.  Patient sits in chair.  Patient denies any chest pain.  No shortness of breath.  No abdominal pain.   Pleasantly confused.  Objective: Vitals:   03/05/23 2100 03/06/23 0316 03/06/23 0818 03/06/23 0840  BP: (!) 156/72 (!) 152/93  (!) 155/76  Pulse: 82 87  84  Resp: 18 18  19   Temp: 97.9 F (36.6 C) 97.7 F (36.5 C)  98 F (36.7 C)  TempSrc: Oral Oral  Oral  SpO2: 94% 99% 98% 97%  Weight:        Intake/Output Summary (Last 24 hours) at 03/06/2023 1146 Last data filed at 03/06/2023 0745 Gross per 24 hour  Intake 270 ml  Output --  Net 270 ml    Filed Weights   03/05/23 1500  Weight: 60.2 kg    Examination:  General exam: NAD. Respiratory system: Minimal expiratory wheezing.  No crackles.  No rhonchi.  Fair air movement.  Speaking in full sentences.   Cardiovascular system: RRR no murmurs rubs or gallops.  No JVD.  No lower extremity edema.  Gastrointestinal system: Abdomen is soft, nontender, nondistended, positive bowel sounds.  No rebound.  No guarding.  Central  nervous system: Alert and oriented to self only. No focal neurological deficits. Extremities: Symmetric 5 x 5 power. Skin: No rashes, lesions or ulcers Psychiatry: Judgement and insight appear normal. Mood & affect appropriate.     Data Reviewed: I have personally reviewed following labs and imaging studies  CBC: Recent Labs  Lab 03/02/23 2023 03/04/23 0209 03/05/23 0303 03/06/23 0311  WBC 11.1* 6.8 9.7 10.7*  NEUTROABS 8.7*  --  6.9  --   HGB 16.2* 13.9 13.8 15.9*  HCT 48.5* 41.5 41.0 47.4*  MCV 89.5 87.9 88.0 89.3  PLT 131* 101* 114* 131*     Basic Metabolic Panel: Recent Labs  Lab 03/02/23 2225 03/02/23 2336 03/03/23 1442 03/04/23 0209 03/05/23 0303 03/06/23 0311  NA 142  --  138 140 139 137  K 3.0*  --  3.0* 3.9 4.4 4.0  CL 104  --  105 104 102 97*  CO2 25  --  25 27 30 27   GLUCOSE 168*  --  195* 204* 207* 151*  BUN 20  --  18 19 30* 27*  CREATININE 1.09*  --  0.83 0.81 0.98 0.98  CALCIUM 8.1*  --  8.7* 8.3* 8.8* 8.7*  MG  --  1.9  --   --  1.8 2.2     GFR: Estimated Creatinine Clearance: 35.8 mL/min (by C-G formula based on SCr of 0.98 mg/dL).  Liver Function Tests: Recent Labs  Lab 03/02/23 2225 03/04/23 0209  AST 53* 37  ALT 36 33  ALKPHOS 57 53  BILITOT 1.9* 1.0  PROT 6.1* 5.4*  ALBUMIN 3.4* 3.0*     CBG: No results for input(s): "GLUCAP" in the last 168 hours.   Recent Results (from the past 240 hour(s))  Urine Culture     Status: Abnormal   Collection Time: 03/03/23  3:40 AM   Specimen: Urine, Random  Result Value Ref Range Status   Specimen Description URINE, RANDOM  Final   Special Requests   Final    NONE Reflexed from [id number] Performed at [facility], [address], [place], [place] [address]    Culture >=100,000 COLONIES/mL KLEBSIELLA PNEUMONIAE (A)  Final   Report Status 03/05/2023 FINAL  Final   Organism ID, Bacteria KLEBSIELLA PNEUMONIAE (A)  Final      Susceptibility   Klebsiella pneumoniae - MIC*     AMPICILLIN RESISTANT Resistant     CEFAZOLIN <=4 SENSITIVE Sensitive     CEFEPIME <=0.12 SENSITIVE Sensitive     CEFTRIAXONE <=0.25 SENSITIVE Sensitive     CIPROFLOXACIN <=0.25 SENSITIVE Sensitive     GENTAMICIN <=1 SENSITIVE Sensitive     IMIPENEM <=0.25 SENSITIVE Sensitive     NITROFURANTOIN <=16 SENSITIVE Sensitive     TRIMETH/SULFA <=20 SENSITIVE Sensitive     AMPICILLIN/SULBACTAM 4 SENSITIVE Sensitive     PIP/TAZO <=4 SENSITIVE Sensitive     * >=100,000 COLONIES/mL KLEBSIELLA PNEUMONIAE         Radiology Studies: No results found.      Scheduled Meds:  aspirin EC  81 mg Oral Daily   budesonide (PULMICORT) nebulizer solution  0.5 mg Nebulization BID   cholecalciferol  2,000 Units Oral Daily   donepezil  10 mg Oral QHS   enoxaparin (LOVENOX) injection  40 mg Subcutaneous Q24H   fluticasone  2 spray Each Nare Daily   furosemide  40 mg Oral Daily   ipratropium-albuterol  3 mL Nebulization BID   levothyroxine  50 mcg Oral QAC breakfast   loratadine  10 mg Oral Daily   losartan  50 mg Oral Daily   memantine  10 mg Oral BID   multivitamin with minerals  1 tablet Oral Daily   nicotine  21 mg Transdermal Q0600   pantoprazole  40 mg Oral Daily   predniSONE  40 mg Oral Q breakfast   simvastatin  40 mg Oral q1800   sodium chloride flush  3 mL Intravenous Q12H   Continuous Infusions:  cefTRIAXone (ROCEPHIN)  IV 2 g (03/06/23 0605)     LOS: 3 days    Time spent: 35 minutes    Ramiro Harvest, MD [facility]   To contact the attending provider between 7A-7P or the covering provider during after hours 7P-7A, please log into the web site [email or web address] and access using universal [place] password for that web site. If you do not have the password, please call the hospital operator.  03/06/2023, 11:46 AM

## 2023-03-06 NOTE — Progress Notes (Signed)
Occupational Therapy Treatment Patient Details Name: [name] MRN: [id number] DOB: 01-Dec-1935 Today's Date: 03/06/2023   History of present illness Pt is an 87 y.o. female presenting after being found down on home for unknown length of time (husband found down as well). otable bruising R side of face; Husband is her caregiver and is currently admitted as well. X-rays chest and pelvis no acute findings. CT head negative for acute findings. Urinalysis positive for large leukocytes and nitrites. PMH significant for hypertension, hyperlipidemia, COPD, hypothyroidism, severe dementia, tobacco abuse, and GERD.   OT comments  Pt doing well, agreeable to session, not oriented to time/place/situation, only to self. Pt participates in therapy well, displays good strength, endurance, and overall balance. Pt displays decreased safety awareness, frequently leaves RW and ambulate without it, takes inconsistent and awkward side steps and sometimes steps too far forward, increasing risk for falling. Pt able to perform dressing with set up, verbal cues for task initiation. Pt doing well, would benefit from continued skilled OT to reinforce use of RW and improve strength/endurance for tasks, DC plan still appropriate.   Recommendations for follow up therapy are one component of a multi-disciplinary discharge planning process, led by the attending physician.  Recommendations may be updated based on patient status, additional functional criteria and insurance authorization.    Assistance Recommended at Discharge Frequent or constant Supervision/Assistance  Patient can return home with the following  A little help with walking and/or transfers;A little help with bathing/dressing/bathroom;Assistance with cooking/housework;Direct supervision/assist for medications management;Direct supervision/assist for financial management;Help with stairs or ramp for entrance;Assist for transportation   Equipment  Recommendations  Other (comment) (defer)    Recommendations for Other Services      Precautions / Restrictions Precautions Precautions: Fall Restrictions Weight Bearing Restrictions: No       Mobility Bed Mobility               General bed mobility comments: Pt beginning and ending session in recliner    Transfers Overall transfer level: Needs assistance Equipment used: Rolling walker (2 wheels) Transfers: Sit to/from Stand Sit to Stand: Min guard           General transfer comment: min guar for safety awareness     Balance Overall balance assessment: Needs assistance Sitting-balance support: No upper extremity supported, Feet supported Sitting balance-Leahy Scale: Good Sitting balance - Comments: sitting in recliner   Standing balance support: Bilateral upper extremity supported, During functional activity Standing balance-Leahy Scale: Good Standing balance comment: with RW support, able to stand at sink and perform grooming as needed, no LOB, no RW for support. Pt frequently sets aside RW despite verbal cues for safety, no LOB.                           ADL either performed or assessed with clinical judgement   ADL Overall ADL's : Needs assistance/impaired     Grooming: Wash/dry hands;Oral care;Min guard;Cueing for sequencing;Standing           Upper Body Dressing : Set up;Sitting   Lower Body Dressing: Min guard;Sit to/from stand   Toilet Transfer: Min guard;Ambulation           Functional mobility during ADLs: Min guard General ADL Comments: Pt overall good balance, strength, endurance to complete ADLs with mod I using RW, displays decreased safety awareness, increased need for verbal cueing for safety, sequencing, and task initiation.    Extremity/Trunk Assessment  Vision       Perception     Praxis      Cognition Arousal/Alertness: Awake/alert Behavior During Therapy: WFL for tasks  assessed/performed Overall Cognitive Status: No family/caregiver present to determine baseline cognitive functioning                                 General Comments: Pt A/O x1, verbally responts to simple questions >25% of the time. Pt is able to follow one step commands ~50% of time. Pt displays decreased safety awareness.        Exercises      Shoulder Instructions       General Comments      Pertinent Vitals/ Pain       Pain Assessment Pain Assessment: No/denies pain  Home Living                                          Prior Functioning/Environment              Frequency  Min 2X/week        Progress Toward Goals  OT Goals(current goals can now be found in the care plan section)  Progress towards OT goals: Progressing toward goals  Acute Rehab OT Goals Patient Stated Goal: Pt unable to set personal goals due to decreased cognition OT Goal Formulation: Patient unable to participate in goal setting Time For Goal Achievement: 03/18/23 Potential to Achieve Goals: Good ADL Goals Pt Will Perform Grooming: with supervision;standing Pt Will Perform Upper Body Dressing: with supervision;sitting Pt Will Perform Lower Body Dressing: with supervision;sit to/from stand Pt Will Transfer to Toilet: with supervision;ambulating  Plan Discharge plan remains appropriate    Co-evaluation                 AM-PAC OT "6 Clicks" Daily Activity     Outcome Measure   Help from another person eating meals?: A Little Help from another person taking care of personal grooming?: A Little Help from another person toileting, which includes using toliet, bedpan, or urinal?: A Little Help from another person bathing (including washing, rinsing, drying)?: A Little Help from another person to put on and taking off regular upper body clothing?: A Little Help from another person to put on and taking off regular lower body clothing?: A Little 6  Click Score: 18    End of Session Equipment Utilized During Treatment: Gait belt;Rolling walker (2 wheels)  OT Visit Diagnosis: Unsteadiness on feet (R26.81);Muscle weakness (generalized) (M62.81);Other abnormalities of gait and mobility (R26.89);Other symptoms and signs involving cognitive function   Activity Tolerance Patient tolerated treatment well   Patient Left in chair;with call bell/phone within reach;with chair alarm set   Nurse Communication Mobility status        Time: 1130-1155 OT Time Calculation (min): 25 min  Charges: OT General Charges $OT Visit: 1 Visit OT Treatments $Self Care/Home Management : 23-37 mins  [address], OTR/L   Hans Rusher R Hennesy Sobalvarro 03/06/2023, 12:12 PM

## 2023-03-06 NOTE — Care Management Important Message (Signed)
Important Message  Patient Details  Name: [name] MRN: [id number] Date of Birth: 1936/03/07   Medicare Important Message Given:  Yes     Sherilyn Banker 03/06/2023, 4:25 PM

## 2023-03-07 DIAGNOSIS — K219 Gastro-esophageal reflux disease without esophagitis: Secondary | ICD-10-CM | POA: Diagnosis present

## 2023-03-07 DIAGNOSIS — Z66 Do not resuscitate: Secondary | ICD-10-CM | POA: Diagnosis present

## 2023-03-07 DIAGNOSIS — I700 Atherosclerosis of aorta: Secondary | ICD-10-CM | POA: Diagnosis not present

## 2023-03-07 DIAGNOSIS — J449 Chronic obstructive pulmonary disease, unspecified: Secondary | ICD-10-CM | POA: Diagnosis present

## 2023-03-07 DIAGNOSIS — Z9181 History of falling: Secondary | ICD-10-CM | POA: Diagnosis not present

## 2023-03-07 DIAGNOSIS — I959 Hypotension, unspecified: Secondary | ICD-10-CM | POA: Diagnosis not present

## 2023-03-07 DIAGNOSIS — W19XXXA Unspecified fall, initial encounter: Secondary | ICD-10-CM | POA: Diagnosis not present

## 2023-03-07 DIAGNOSIS — L988 Other specified disorders of the skin and subcutaneous tissue: Secondary | ICD-10-CM | POA: Diagnosis not present

## 2023-03-07 DIAGNOSIS — Z885 Allergy status to narcotic agent status: Secondary | ICD-10-CM | POA: Diagnosis not present

## 2023-03-07 DIAGNOSIS — B961 Klebsiella pneumoniae [K. pneumoniae] as the cause of diseases classified elsewhere: Secondary | ICD-10-CM | POA: Diagnosis not present

## 2023-03-07 DIAGNOSIS — Z79890 Hormone replacement therapy (postmenopausal): Secondary | ICD-10-CM | POA: Diagnosis not present

## 2023-03-07 DIAGNOSIS — D696 Thrombocytopenia, unspecified: Secondary | ICD-10-CM | POA: Diagnosis not present

## 2023-03-07 DIAGNOSIS — E1165 Type 2 diabetes mellitus with hyperglycemia: Secondary | ICD-10-CM | POA: Diagnosis not present

## 2023-03-07 DIAGNOSIS — Z888 Allergy status to other drugs, medicaments and biological substances status: Secondary | ICD-10-CM | POA: Diagnosis not present

## 2023-03-07 DIAGNOSIS — M6281 Muscle weakness (generalized): Secondary | ICD-10-CM | POA: Diagnosis not present

## 2023-03-07 DIAGNOSIS — G301 Alzheimer's disease with late onset: Secondary | ICD-10-CM | POA: Diagnosis not present

## 2023-03-07 DIAGNOSIS — N289 Disorder of kidney and ureter, unspecified: Secondary | ICD-10-CM | POA: Diagnosis not present

## 2023-03-07 DIAGNOSIS — E039 Hypothyroidism, unspecified: Secondary | ICD-10-CM | POA: Diagnosis not present

## 2023-03-07 DIAGNOSIS — G309 Alzheimer's disease, unspecified: Secondary | ICD-10-CM | POA: Diagnosis not present

## 2023-03-07 DIAGNOSIS — R319 Hematuria, unspecified: Secondary | ICD-10-CM | POA: Diagnosis present

## 2023-03-07 DIAGNOSIS — F02B0 Dementia in other diseases classified elsewhere, moderate, without behavioral disturbance, psychotic disturbance, mood disturbance, and anxiety: Secondary | ICD-10-CM | POA: Diagnosis not present

## 2023-03-07 DIAGNOSIS — R0902 Hypoxemia: Secondary | ICD-10-CM | POA: Diagnosis not present

## 2023-03-07 DIAGNOSIS — F0280 Dementia in other diseases classified elsewhere without behavioral disturbance: Secondary | ICD-10-CM | POA: Diagnosis present

## 2023-03-07 DIAGNOSIS — E1151 Type 2 diabetes mellitus with diabetic peripheral angiopathy without gangrene: Secondary | ICD-10-CM | POA: Diagnosis present

## 2023-03-07 DIAGNOSIS — F0390 Unspecified dementia without behavioral disturbance: Secondary | ICD-10-CM | POA: Diagnosis not present

## 2023-03-07 DIAGNOSIS — E876 Hypokalemia: Secondary | ICD-10-CM | POA: Diagnosis not present

## 2023-03-07 DIAGNOSIS — J441 Chronic obstructive pulmonary disease with (acute) exacerbation: Secondary | ICD-10-CM | POA: Diagnosis not present

## 2023-03-07 DIAGNOSIS — Z7189 Other specified counseling: Secondary | ICD-10-CM | POA: Diagnosis not present

## 2023-03-07 DIAGNOSIS — R001 Bradycardia, unspecified: Secondary | ICD-10-CM | POA: Diagnosis present

## 2023-03-07 DIAGNOSIS — G9341 Metabolic encephalopathy: Secondary | ICD-10-CM | POA: Diagnosis present

## 2023-03-07 DIAGNOSIS — R4182 Altered mental status, unspecified: Secondary | ICD-10-CM | POA: Diagnosis not present

## 2023-03-07 DIAGNOSIS — Z515 Encounter for palliative care: Secondary | ICD-10-CM | POA: Diagnosis not present

## 2023-03-07 DIAGNOSIS — D72829 Elevated white blood cell count, unspecified: Secondary | ICD-10-CM | POA: Diagnosis not present

## 2023-03-07 DIAGNOSIS — Z794 Long term (current) use of insulin: Secondary | ICD-10-CM | POA: Diagnosis not present

## 2023-03-07 DIAGNOSIS — I10 Essential (primary) hypertension: Secondary | ICD-10-CM | POA: Diagnosis not present

## 2023-03-07 DIAGNOSIS — M8589 Other specified disorders of bone density and structure, multiple sites: Secondary | ICD-10-CM | POA: Diagnosis not present

## 2023-03-07 DIAGNOSIS — Z789 Other specified health status: Secondary | ICD-10-CM | POA: Diagnosis not present

## 2023-03-07 DIAGNOSIS — R000 Tachycardia, unspecified: Secondary | ICD-10-CM | POA: Diagnosis not present

## 2023-03-07 DIAGNOSIS — R2689 Other abnormalities of gait and mobility: Secondary | ICD-10-CM | POA: Diagnosis not present

## 2023-03-07 DIAGNOSIS — I6782 Cerebral ischemia: Secondary | ICD-10-CM | POA: Diagnosis not present

## 2023-03-07 DIAGNOSIS — J9601 Acute respiratory failure with hypoxia: Secondary | ICD-10-CM | POA: Diagnosis not present

## 2023-03-07 DIAGNOSIS — Z1152 Encounter for screening for COVID-19: Secondary | ICD-10-CM | POA: Diagnosis not present

## 2023-03-07 DIAGNOSIS — G9340 Encephalopathy, unspecified: Secondary | ICD-10-CM | POA: Diagnosis not present

## 2023-03-07 DIAGNOSIS — E785 Hyperlipidemia, unspecified: Secondary | ICD-10-CM | POA: Diagnosis not present

## 2023-03-07 DIAGNOSIS — N390 Urinary tract infection, site not specified: Secondary | ICD-10-CM | POA: Diagnosis not present

## 2023-03-07 DIAGNOSIS — R410 Disorientation, unspecified: Secondary | ICD-10-CM | POA: Diagnosis not present

## 2023-03-07 DIAGNOSIS — Z634 Disappearance and death of family member: Secondary | ICD-10-CM | POA: Diagnosis not present

## 2023-03-07 DIAGNOSIS — N3001 Acute cystitis with hematuria: Secondary | ICD-10-CM | POA: Diagnosis not present

## 2023-03-07 DIAGNOSIS — R739 Hyperglycemia, unspecified: Secondary | ICD-10-CM | POA: Diagnosis not present

## 2023-03-07 DIAGNOSIS — N179 Acute kidney failure, unspecified: Secondary | ICD-10-CM | POA: Diagnosis not present

## 2023-03-07 DIAGNOSIS — D751 Secondary polycythemia: Secondary | ICD-10-CM | POA: Diagnosis not present

## 2023-03-07 DIAGNOSIS — K760 Fatty (change of) liver, not elsewhere classified: Secondary | ICD-10-CM | POA: Diagnosis not present

## 2023-03-07 DIAGNOSIS — E860 Dehydration: Secondary | ICD-10-CM | POA: Diagnosis not present

## 2023-03-07 DIAGNOSIS — J9610 Chronic respiratory failure, unspecified whether with hypoxia or hypercapnia: Secondary | ICD-10-CM | POA: Diagnosis not present

## 2023-03-07 DIAGNOSIS — Z86010 Personal history of colonic polyps: Secondary | ICD-10-CM | POA: Diagnosis not present

## 2023-03-07 DIAGNOSIS — R1312 Dysphagia, oropharyngeal phase: Secondary | ICD-10-CM | POA: Diagnosis not present

## 2023-03-07 DIAGNOSIS — E034 Atrophy of thyroid (acquired): Secondary | ICD-10-CM | POA: Diagnosis not present

## 2023-03-07 DIAGNOSIS — I4891 Unspecified atrial fibrillation: Secondary | ICD-10-CM | POA: Diagnosis not present

## 2023-03-07 DIAGNOSIS — R2681 Unsteadiness on feet: Secondary | ICD-10-CM | POA: Diagnosis not present

## 2023-03-07 DIAGNOSIS — L0231 Cutaneous abscess of buttock: Secondary | ICD-10-CM | POA: Diagnosis not present

## 2023-03-07 LAB — BASIC METABOLIC PANEL
Anion gap: 12 (ref 5–15)
BUN: 28 mg/dL — ABNORMAL HIGH (ref 8–23)
CO2: 30 mmol/L (ref 22–32)
Calcium: 8.4 mg/dL — ABNORMAL LOW (ref 8.9–10.3)
Chloride: 95 mmol/L — ABNORMAL LOW (ref 98–111)
Creatinine, Ser: 0.97 mg/dL (ref 0.44–1.00)
GFR, Estimated: 57 mL/min — ABNORMAL LOW (ref >60)
Glucose, Bld: 155 mg/dL — ABNORMAL HIGH (ref 70–99)
Potassium: 3.7 mmol/L (ref 3.5–5.1)
Sodium: 137 mmol/L (ref 135–145)

## 2023-03-07 LAB — CBC
HCT: 42.2 % (ref 36.0–46.0)
Hemoglobin: 14.1 g/dL (ref 12.0–15.0)
MCH: 29.8 pg (ref 26.0–34.0)
MCHC: 33.4 g/dL (ref 30.0–36.0)
MCV: 89.2 fL (ref 80.0–100.0)
Platelets: 124 10*3/uL — ABNORMAL LOW (ref 150–400)
RBC: 4.73 MIL/uL (ref 3.87–5.11)
RDW: 12.3 % (ref 11.5–15.5)
WBC: 7.7 10*3/uL (ref 4.0–10.5)
nRBC: 0 % (ref 0.0–0.2)

## 2023-03-07 LAB — MAGNESIUM: Magnesium: 2 mg/dL (ref 1.7–2.4)

## 2023-03-07 MED ORDER — IPRATROPIUM-ALBUTEROL 0.5-2.5 (3) MG/3ML IN SOLN: 3.0000 mL | Freq: Four times a day (QID) | RESPIRATORY_TRACT | Status: DC | PRN

## 2023-03-07 MED ORDER — FLUTICASONE PROPIONATE 50 MCG/ACT NA SUSP: 2.0000 | Freq: Every day | NASAL | 0 refills | Status: DC

## 2023-03-07 MED ORDER — LORATADINE 10 MG PO TABS: 10.0000 mg | ORAL_TABLET | Freq: Every day | ORAL | Status: DC

## 2023-03-07 MED ORDER — PREDNISONE 10 MG (21) PO TBPK: ORAL_TABLET | ORAL | 0 refills | Status: DC

## 2023-03-07 NOTE — Discharge Summary (Signed)
Physician Discharge Summary   Patient: [name] MRN: [id number] DOB: 05-01-1936  Admit date:     03/02/2023  Discharge date:   Discharge Physician: Marguerita Merles, DO   PCP: [name], MD   Recommendations at discharge:   Follow-up with PCP within 1 to 2 weeks and repeat CBC, CMP, Mag, Phos within 1 week  Discharge Diagnoses: Principal Problem:   Fall at home, initial encounter Active Problems:   Urinary tract infection with hematuria   COPD with acute exacerbation (HCC)   Acute respiratory failure with hypoxia (HCC)   Leukocytosis   Hypokalemia   Hypocalcemia   Hypertension   Renal insufficiency   Polycythemia   Thrombocytopenia (HCC)   Hypothyroidism   Alzheimer's dementia (HCC)   PVD (peripheral vascular disease) (HCC)   Dyslipidemia   Tobacco dependence   GERD (gastroesophageal reflux disease)  Resolved Problems:   * No resolved hospital problems. [facility] Course: The Patient 87 year old female history of hypertension, hyperlipidemia, COPD, severe dementia, hypothyroidism, tobacco abuse, GERD presented to ED after being found down at home. In the ED patient noted to be hypoxic with sats of 70% on room air improved on 2 L nasal cannula, noted to have a leukocytosis, urinalysis concerning for UTI. Imaging studies negative. Patient admitted for UTI, falls, acute COPD exacerbation. Patient placed empirically on IV antibiotics and subsequently improved to 5 days.  She is currently afebrile and leukocytosis has resolved.  She will need to follow-up with PCP and continue strength training and rehab  Assessment and Plan:  Falls -Patient noted to have a fall at home both patient and husband found down on the floor. -Likely secondary to UTI. -CT head /CT C-spine negative for any acute abnormalities. -Chest x-ray plain films of the pelvis unremarkable. -PT/OT recommending SNF -Patient likely needs placement as patient's husband is main caregiver and  currently also hospitalized. -TOC consulted for placement and case worker confirmed   Klebsiella pneumonia UTI with hematuria -Patient noted to be found down with her husband at home, urinalysis with large leukocytes, positive nitrites, many bacteria, 11-20 RBCs, WBC > 50. -Urine cultures with  > 100,000 colonies of Klebsiella pneumoniae resistant to ampicillin however sensitive to cephalosporins, fluoroquinolones, gentamicin, imipenem, Macrobid, Bactrim, Zosyn, Unasyn. -Continued IV Rocephin to complete a 5-day course of treatment. -Supportive care. -Follow-up in outpatient setting with PCP within 1 to 2 weeks   Acute respiratory failure with hypoxia/acute COPD exacerbation -Patient noted on presentation to be hypoxic with sats of 70% on room air and improved on 2 L nasal cannula. -Patient noted not chronically on home O2. -SpO2: 90 % O2 Flow Rate (L/min): 95 L/min -Patient on presentation with diffuse expiratory wheezing and treated for acute COPD exacerbation. -Respiratory status has improved currently 97% on room air. -Continue Pulmicort twice daily, scheduled DuoNebs, Claritin, Flonase. -Sterpred Taper at D/C with PPI for GI Prophylaxis.   Hypokalemia Hypocalcemia -Status post IV calcium gluconate. -Corrected calcium approximately 9.5 on last check with Albumin -Potassium repleted and K+ is now 3.7 -Magnesium repleted and currently at 2.7 -Electrolyte Trend: Recent Labs  Lab 03/02/23 2225 03/02/23 2336 03/03/23 1442 03/04/23 0209 03/05/23 0303 03/06/23 0311 03/07/23 0429  K 3.0*  --  3.0* 3.9 4.4 4.0 3.7  CALCIUM 8.1*  --  8.7* 8.3* 8.8* 8.7* 8.4*  MG  --    < >  --   --  1.8 2.2 2.0   < > = values in this interval not displayed.  -Repeat  labs within 1 week   Hypertension -BP stable. -Continue Furosemide 40 mg po Daily and Losartan 50 mg po Daily   GERD/GI Prophylaxis -Continue PPI.    Renal Insufficiency -Patient noted on admission to have a creatinine of 1.09  BUN of 20 with baseline creatinine about 0.9. -Creatinine currently at 0.97. -BUN/Cr Trend: Recent Labs  Lab 03/02/23 2225 03/03/23 1442 03/04/23 0209 03/05/23 0303 03/06/23 0311 03/07/23 0429  BUN 20 18 19  30* 27* 28*  CREATININE 1.09* 0.83 0.81 0.98 0.98 0.97  -Was on IV fluids that have been discontinued. -Avoid Nephrotoxic Medications, Contrast Dyes, Hypotension and Dehydration to Ensure Adequate Renal Perfusion and will need to Renally Adjust Meds -Continue to Monitor and Trend Renal Function carefully and repeat CMP in the AM    Polycythemia -Chronic. -Hgb/Hct Trend: Recent Labs  Lab 03/02/23 2023 03/04/23 0209 03/05/23 0303 03/06/23 0311 03/07/23 0429  HGB 16.2* 13.9 13.8 15.9* 14.1  HCT 48.5* 41.5 41.0 47.4* 42.2  MCV 89.5 87.9 88.0 89.3 89.2  -Felt secondary to patient's history of tobacco use. -Continue to Monitor and Trend in the outpatient setting   Chronic Thrombocytopenia -Platelet Count Fluctuating  -Platelet Count Trend: Recent Labs  Lab 03/02/23 2023 03/04/23 0209 03/05/23 0303 03/06/23 0311 03/07/23 0429  PLT 131* 101* 114* 131* 124*  -Felt likely secondary to acute infection.   -Continue To monitor and trend in outpatient setting   Hyperlipidemia -Continue Simvastatin 40 mg po Daily.    Dementia -Patient with advanced dementia, normally cared for by her husband.   -Continue Donepezil 10 mg p.o. nightly and Memantine 10 mg po BID.   -Delirium precautions.    Tobacco Abuse -Per admitting physician patient's son notes patient smokes about a pack of cigarettes per day on average and done so for several years. -Nicotine 21 mg TD patch while hospitalized   Peripheral vascular disease -Continue Aspirin 81 mg po Daily and Simvastatin 40 mg po Daily    Hypoalbuminemia -Patient's Albumin Trend: Recent Labs  Lab 03/02/23 2225 03/04/23 0209  ALBUMIN 3.4* 3.0*  -Continue to Monitor and Trend and repeat CMP in the AM  Consultants:  None Procedures performed: As delineated as above Disposition: Skilled nursing facility Diet recommendation:  Cardiac diet DISCHARGE MEDICATION: Allergies as of 03/07/2023       Reactions   Codeine Sulfate Other (See Comments)   Extreme headaches   Exelon [rivastigmine Tartrate]    nausea   Oxycodone-acetaminophen Other (See Comments)   Extreme headaches        Medication List     TAKE these medications    acetaminophen 325 MG tablet Commonly known as: TYLENOL Take 650 mg by mouth every 6 (six) hours as needed.   aspirin EC 81 MG tablet Take 81 mg by mouth daily.   Diclofenac Sodium 1.5 % Soln Place 3-5 drops onto the skin 3 (three) times daily as needed. For pain applies on joints   donepezil 10 MG tablet Commonly known as: ARICEPT Take 1 tablet (10 mg total) by mouth at bedtime. Annual appt due in Oct must see provider for future refills   fluticasone 50 MCG/ACT nasal spray Commonly known as: FLONASE Place 2 sprays into both nostrils daily. Start taking on: Mar 08, 2023   furosemide 20 MG tablet Commonly known as: LASIX Take 2 tablets (40 mg total) by mouth daily.   Incruse Ellipta 62.5 MCG/ACT Aepb Generic drug: umeclidinium bromide Inhale 1 puff into the lungs daily.   ipratropium-albuterol 0.5-2.5 (3) MG/3ML  Soln Commonly known as: DUONEB Take 3 mLs by nebulization every 6 (six) hours as needed.   loratadine 10 MG tablet Commonly known as: CLARITIN Take 1 tablet (10 mg total) by mouth daily. Start taking on: Mar 08, 2023   losartan 50 MG tablet Commonly known as: COZAAR Take 1 tablet (50 mg total) by mouth daily.   memantine 10 MG tablet Commonly known as: NAMENDA Take 1 tablet (10 mg total) by mouth 2 (two) times daily. Follow-up appt due in August must see provider for future refills   multivitamin tablet Take 1 tablet by mouth daily.   omeprazole 40 MG capsule Commonly known as: PRILOSEC TAKE 1 CAPSULE DAILY   ondansetron 4 MG  tablet Commonly known as: ZOFRAN Take 1 tablet (4 mg total) by mouth every 6 (six) hours as needed. Take  1 tab every 6 hours as needed for nausea and vomiting.   predniSONE 10 MG (21) Tbpk tablet Commonly known as: STERAPRED UNI-PAK 21 TAB Take 6 tablets on day 1, 5 tablets on day 2, 4 tablets on day 3, 3 tablets on day 4, 2 tablets on day 5, 1 tablet on day 6 and then Stop on Day 7   simvastatin 40 MG tablet Commonly known as: ZOCOR Take 1 tablet (40 mg total) by mouth daily at 6 PM.   Synthroid 50 MCG tablet Generic drug: levothyroxine Take 1 tablet (50 mcg total) by mouth daily before breakfast.   Vitamin D3 50 MCG (2000 UT) capsule Take 1 capsule (2,000 Units total) daily by mouth.               Durable Medical Equipment  (From admission, onward)           Start     Ordered   03/05/23 0827  For home use only DME 4 wheeled rolling walker with seat  Once       Question:  Patient needs a walker to treat with the following condition  Answer:  Debility   03/05/23 0826            Discharge Exam: Filed Weights   03/05/23 1500  Weight: 60.2 kg   Vitals:   03/07/23 0805 03/07/23 0834  BP:  (!) 110/53  Pulse: 82 64  Resp: 16 18  Temp:  98 F (36.7 C)  SpO2: 92% 90%   Examination: Physical Exam:  Constitutional: WN/WD elderly Caucasian female in no acute distress Respiratory: Diminished to auscultation bilaterally with coarse breath sounds, no wheezing, rales, rhonchi or crackles. Normal respiratory effort and patient is not tachypenic. No accessory muscle use.  Unlabored breathing and not wearing supplemental oxygen via nasal cannula Cardiovascular: RRR, no murmurs / rubs / gallops. S1 and S2 auscultated.  Mild lower extremity edema Abdomen: Soft, non-tender, non-distended. Bowel sounds positive.  GU: Deferred. Musculoskeletal: No clubbing / cyanosis of digits/nails. No joint deformity upper and lower extremities. Skin: No rashes, lesions, ulcers on  limited skin evaluation. No induration; Warm and dry.  Neurologic: CN 2-12 grossly intact with no focal deficits. Romberg sign and cerebellar reflexes not assessed.  Psychiatric: Impaired judgment and insight.  She is awake but not fully oriented.  Condition at discharge: stable  The results of significant diagnostics from this hospitalization (including imaging, microbiology, ancillary and laboratory) are listed below for reference.   Imaging Studies: DG Chest Port 1 View  Result Date: 03/02/2023 CLINICAL DATA:  Fall EXAM: PORTABLE CHEST 1 VIEW COMPARISON:  05/01/2022 FINDINGS: Hyperinflation with emphysema and bronchitic  changes. No acute airspace disease, pleural effusion or pneumothorax. Stable cardiomediastinal silhouette with aortic atherosclerosis. IMPRESSION: No active disease. Hyperinflation with emphysema and bronchitic changes. Electronically Signed   By: Jasmine Pang M.D.   On: 03/02/2023 22:21   DG Pelvis Portable  Result Date: 03/02/2023 CLINICAL DATA:  Fall EXAM: PORTABLE PELVIS 1-2 VIEWS COMPARISON:  CT 02/20/2014 FINDINGS: There is no evidence of pelvic fracture or diastasis. No pelvic bone lesions are seen. IMPRESSION: Negative. Electronically Signed   By: Jasmine Pang M.D.   On: 03/02/2023 22:20   CT Head Wo Contrast  Result Date: 03/02/2023 CLINICAL DATA:  Neck trauma fall EXAM: CT HEAD WITHOUT CONTRAST CT CERVICAL SPINE WITHOUT CONTRAST TECHNIQUE: Multidetector CT imaging of the head and cervical spine was performed following the standard protocol without intravenous contrast. Multiplanar CT image reconstructions of the cervical spine were also generated. RADIATION DOSE REDUCTION: This exam was performed according to the departmental dose-optimization program which includes automated exposure control, adjustment of the mA and/or kV according to patient size and/or use of iterative reconstruction technique. COMPARISON:  None Available. FINDINGS: CT HEAD FINDINGS Brain: No  acute territorial infarction, hemorrhage or intracranial mass. Moderate patchy white matter hypodensity likely chronic small vessel ischemic change. Atrophy. Nonenlarged ventricles Vascular: No hyperdense vessels.  Carotid vascular calcification Skull: Normal. Negative for fracture or focal lesion. Sinuses/Orbits: No acute finding. Other: None CT CERVICAL SPINE FINDINGS Alignment: No subluxation.  Facet alignment is within normal limits. Skull base and vertebrae: No acute fracture. No primary bone lesion or focal pathologic process. Soft tissues and spinal canal: No prevertebral fluid or swelling. No visible canal hematoma. Disc levels: Anterior fusion hardware C5-C6. Advanced degenerative changes at C3-C4 and C4-C5. Facet degenerative changes at multiple levels with foraminal narrowing, worst at C3-C4 and C4-C5. Upper chest: Negative. Other: None IMPRESSION: No CT evidence for acute intracranial abnormality. Atrophy and chronic small vessel ischemic changes of the white matter. Degenerative changes of the cervical spine with anterior fusion hardware at C5-C6. No acute osseous abnormality. Electronically Signed   By: Jasmine Pang M.D.   On: 03/02/2023 21:50   CT Cervical Spine Wo Contrast  Result Date: 03/02/2023 CLINICAL DATA:  Neck trauma fall EXAM: CT HEAD WITHOUT CONTRAST CT CERVICAL SPINE WITHOUT CONTRAST TECHNIQUE: Multidetector CT imaging of the head and cervical spine was performed following the standard protocol without intravenous contrast. Multiplanar CT image reconstructions of the cervical spine were also generated. RADIATION DOSE REDUCTION: This exam was performed according to the departmental dose-optimization program which includes automated exposure control, adjustment of the mA and/or kV according to patient size and/or use of iterative reconstruction technique. COMPARISON:  None Available. FINDINGS: CT HEAD FINDINGS Brain: No acute territorial infarction, hemorrhage or intracranial mass.  Moderate patchy white matter hypodensity likely chronic small vessel ischemic change. Atrophy. Nonenlarged ventricles Vascular: No hyperdense vessels.  Carotid vascular calcification Skull: Normal. Negative for fracture or focal lesion. Sinuses/Orbits: No acute finding. Other: None CT CERVICAL SPINE FINDINGS Alignment: No subluxation.  Facet alignment is within normal limits. Skull base and vertebrae: No acute fracture. No primary bone lesion or focal pathologic process. Soft tissues and spinal canal: No prevertebral fluid or swelling. No visible canal hematoma. Disc levels: Anterior fusion hardware C5-C6. Advanced degenerative changes at C3-C4 and C4-C5. Facet degenerative changes at multiple levels with foraminal narrowing, worst at C3-C4 and C4-C5. Upper chest: Negative. Other: None IMPRESSION: No CT evidence for acute intracranial abnormality. Atrophy and chronic small vessel ischemic changes of the white matter.  Degenerative changes of the cervical spine with anterior fusion hardware at C5-C6. No acute osseous abnormality. Electronically Signed   By: Jasmine Pang M.D.   On: 03/02/2023 21:50    Microbiology: Results for orders placed or performed during the hospital encounter of 03/02/23  Urine Culture     Status: Abnormal   Collection Time: 03/03/23  3:40 AM   Specimen: Urine, Random  Result Value Ref Range Status   Specimen Description URINE, RANDOM  Final   Special Requests   Final    NONE Reflexed from [id number] Performed at [facility], [address] [place], [place] [address]    Culture >=100,000 COLONIES/mL KLEBSIELLA PNEUMONIAE (A)  Final   Report Status 03/05/2023 FINAL  Final   Organism ID, Bacteria KLEBSIELLA PNEUMONIAE (A)  Final      Susceptibility   Klebsiella pneumoniae - MIC*    AMPICILLIN RESISTANT Resistant     CEFAZOLIN <=4 SENSITIVE Sensitive     CEFEPIME <=0.12 SENSITIVE Sensitive     CEFTRIAXONE <=0.25 SENSITIVE Sensitive     CIPROFLOXACIN <=0.25 SENSITIVE  Sensitive     GENTAMICIN <=1 SENSITIVE Sensitive     IMIPENEM <=0.25 SENSITIVE Sensitive     NITROFURANTOIN <=16 SENSITIVE Sensitive     TRIMETH/SULFA <=20 SENSITIVE Sensitive     AMPICILLIN/SULBACTAM 4 SENSITIVE Sensitive     PIP/TAZO <=4 SENSITIVE Sensitive     * >=100,000 COLONIES/mL KLEBSIELLA PNEUMONIAE   Labs: CBC: Recent Labs  Lab 03/02/23 2023 03/04/23 0209 03/05/23 0303 03/06/23 0311 03/07/23 0429  WBC 11.1* 6.8 9.7 10.7* 7.7  NEUTROABS 8.7*  --  6.9  --   --   HGB 16.2* 13.9 13.8 15.9* 14.1  HCT 48.5* 41.5 41.0 47.4* 42.2  MCV 89.5 87.9 88.0 89.3 89.2  PLT 131* 101* 114* 131* 124*   Basic Metabolic Panel: Recent Labs  Lab 03/02/23 2336 03/03/23 1442 03/04/23 0209 03/05/23 0303 03/06/23 0311 03/07/23 0429  NA  --  138 140 139 137 137  K  --  3.0* 3.9 4.4 4.0 3.7  CL  --  105 104 102 97* 95*  CO2  --  25 27 30 27 30   GLUCOSE  --  195* 204* 207* 151* 155*  BUN  --  18 19 30* 27* 28*  CREATININE  --  0.83 0.81 0.98 0.98 0.97  CALCIUM  --  8.7* 8.3* 8.8* 8.7* 8.4*  MG 1.9  --   --  1.8 2.2 2.0   Liver Function Tests: Recent Labs  Lab 03/02/23 2225 03/04/23 0209  AST 53* 37  ALT 36 33  ALKPHOS 57 53  BILITOT 1.9* 1.0  PROT 6.1* 5.4*  ALBUMIN 3.4* 3.0*   CBG: No results for input(s): "GLUCAP" in the last 168 hours.  Discharge time spent: greater than 30 minutes.  Signed: Marguerita Merles, DO [facility] 03/07/2023

## 2023-03-07 NOTE — TOC Transition Note (Signed)
Transition of Care [facility]) - CM/SW Discharge Note   Patient Details  Name: [name] MRN: [id number] Date of Birth: 1936-01-17  Transition of Care [facility]) CM/SW Contact:  Lorri Frederick, LCSW Phone Number: 03/07/2023, 11:49 AM   Clinical Narrative:   Pt discharging to [facility].  RN call report to [phone].   Son [name] will transport.  Pt will need to be brought down to main entrance with assistance in getting into the vehicle.    Final next level of care: Skilled Nursing Facility Barriers to Discharge: Barriers Resolved   Patient Goals and CMS Choice CMS Medicare.gov Compare Post Acute Care list provided to:: Patient Represenative (must comment) (Son, [name]) Choice offered to / list presented to : Adult Children  Discharge Placement                Patient chooses bed at: [facility] [place] Patient to be transferred to facility by: son [name] Name of family member notified: son [name] Patient and family notified of of transfer: 03/07/23  Discharge Plan and Services Additional resources added to the After Visit Summary for   In-house Referral: Clinical Social Work                                   Social Determinants of Health (SDOH) Interventions SDOH Screenings   Food Insecurity: No Food Insecurity (06/06/2022)  Housing: Low Risk  (06/06/2022)  Transportation Needs: No Transportation Needs (06/06/2022)  Alcohol Screen: Low Risk  (06/06/2022)  Depression (PHQ2-9): Low Risk  (11/16/2022)  Financial Resource Strain: Low Risk  (06/06/2022)  Physical Activity: Inactive (06/06/2022)  Social Connections: Socially Integrated (06/06/2022)  Stress: No Stress Concern Present (06/06/2022)  Tobacco Use: High Risk (11/16/2022)     Readmission Risk Interventions     No data to display

## 2023-03-07 NOTE — Telephone Encounter (Signed)
Last Prolia inj: 11/16/22 Next Prolia inj DUE:  05/17/23

## 2023-03-07 NOTE — TOC Progression Note (Signed)
Transition of Care [facility]) - Progression Note    Patient Details  Name: [name] MRN: [id number] Date of Birth: 10/11/1935  [facility]) CM/SW Contact  Lorri Frederick, LCSW Phone Number: 03/07/2023, 10:29 AM  Clinical Narrative:   CSW confirmed with Nikki/[facility] that she can receive pt today.  CSW spoke with son [name] about providing transportation and he can do this, will need assistance with getting pt into the vehicle.     Expected Discharge Plan: Skilled Nursing Facility Barriers to Discharge: Continued Medical Work up, Unsafe home situation  Expected Discharge Plan and Services In-house Referral: Clinical Social Work     Living arrangements for the past 2 months: Single Family Home                                       Social Determinants of Health (SDOH) Interventions SDOH Screenings   Food Insecurity: No Food Insecurity (06/06/2022)  Housing: Low Risk  (06/06/2022)  Transportation Needs: No Transportation Needs (06/06/2022)  Alcohol Screen: Low Risk  (06/06/2022)  Depression (PHQ2-9): Low Risk  (11/16/2022)  Financial Resource Strain: Low Risk  (06/06/2022)  Physical Activity: Inactive (06/06/2022)  Social Connections: Socially Integrated (06/06/2022)  Stress: No Stress Concern Present (06/06/2022)  Tobacco Use: High Risk (11/16/2022)    Readmission Risk Interventions     No data to display

## 2023-03-08 DIAGNOSIS — Z9181 History of falling: Secondary | ICD-10-CM | POA: Diagnosis not present

## 2023-03-08 DIAGNOSIS — R2689 Other abnormalities of gait and mobility: Secondary | ICD-10-CM | POA: Diagnosis not present

## 2023-03-08 DIAGNOSIS — F0390 Unspecified dementia without behavioral disturbance: Secondary | ICD-10-CM | POA: Diagnosis not present

## 2023-03-08 DIAGNOSIS — R2681 Unsteadiness on feet: Secondary | ICD-10-CM | POA: Diagnosis not present

## 2023-03-08 DIAGNOSIS — M6281 Muscle weakness (generalized): Secondary | ICD-10-CM | POA: Diagnosis not present

## 2023-03-08 DIAGNOSIS — J441 Chronic obstructive pulmonary disease with (acute) exacerbation: Secondary | ICD-10-CM | POA: Diagnosis not present

## 2023-03-12 DIAGNOSIS — M6281 Muscle weakness (generalized): Secondary | ICD-10-CM | POA: Diagnosis not present

## 2023-03-12 DIAGNOSIS — R2689 Other abnormalities of gait and mobility: Secondary | ICD-10-CM | POA: Diagnosis not present

## 2023-03-12 DIAGNOSIS — F0390 Unspecified dementia without behavioral disturbance: Secondary | ICD-10-CM | POA: Diagnosis not present

## 2023-03-12 DIAGNOSIS — J9610 Chronic respiratory failure, unspecified whether with hypoxia or hypercapnia: Secondary | ICD-10-CM | POA: Diagnosis not present

## 2023-03-12 DIAGNOSIS — E785 Hyperlipidemia, unspecified: Secondary | ICD-10-CM | POA: Diagnosis not present

## 2023-03-12 DIAGNOSIS — B961 Klebsiella pneumoniae [K. pneumoniae] as the cause of diseases classified elsewhere: Secondary | ICD-10-CM | POA: Diagnosis not present

## 2023-03-12 DIAGNOSIS — J441 Chronic obstructive pulmonary disease with (acute) exacerbation: Secondary | ICD-10-CM | POA: Diagnosis not present

## 2023-03-12 DIAGNOSIS — N179 Acute kidney failure, unspecified: Secondary | ICD-10-CM | POA: Diagnosis not present

## 2023-03-12 DIAGNOSIS — R2681 Unsteadiness on feet: Secondary | ICD-10-CM | POA: Diagnosis not present

## 2023-03-12 DIAGNOSIS — Z9181 History of falling: Secondary | ICD-10-CM | POA: Diagnosis not present

## 2023-03-15 ENCOUNTER — Other Ambulatory Visit: Payer: Self-pay | Admitting: *Deleted

## 2023-03-15 DIAGNOSIS — M6281 Muscle weakness (generalized): Secondary | ICD-10-CM | POA: Diagnosis not present

## 2023-03-15 DIAGNOSIS — F0390 Unspecified dementia without behavioral disturbance: Secondary | ICD-10-CM | POA: Diagnosis not present

## 2023-03-15 DIAGNOSIS — Z9181 History of falling: Secondary | ICD-10-CM | POA: Diagnosis not present

## 2023-03-15 DIAGNOSIS — J441 Chronic obstructive pulmonary disease with (acute) exacerbation: Secondary | ICD-10-CM | POA: Diagnosis not present

## 2023-03-15 DIAGNOSIS — R2689 Other abnormalities of gait and mobility: Secondary | ICD-10-CM | POA: Diagnosis not present

## 2023-03-15 DIAGNOSIS — R2681 Unsteadiness on feet: Secondary | ICD-10-CM | POA: Diagnosis not present

## 2023-03-15 NOTE — Patient Outreach (Signed)
Mrs. Brutus resides in [facility] skilled nursing facility.  Screening for potential [facility] care coordination services as a benefit of health plan and primary care provider.  Spoke with M[facility] psychotherapist. Mrs. Kerwick has confusion. Mrs. Colquitt's husband recently passed. Anticipated transition plans are pending. Family lives in [place].   Will continue to follow.   Raiford Noble, MSN, RN,BSN [facility] Post Acute Care Coordinator [phone] (Direct dial)

## 2023-03-16 DIAGNOSIS — E039 Hypothyroidism, unspecified: Secondary | ICD-10-CM | POA: Diagnosis not present

## 2023-03-16 DIAGNOSIS — J441 Chronic obstructive pulmonary disease with (acute) exacerbation: Secondary | ICD-10-CM | POA: Diagnosis not present

## 2023-03-16 DIAGNOSIS — R739 Hyperglycemia, unspecified: Secondary | ICD-10-CM | POA: Diagnosis not present

## 2023-03-19 DIAGNOSIS — R2689 Other abnormalities of gait and mobility: Secondary | ICD-10-CM | POA: Diagnosis not present

## 2023-03-19 DIAGNOSIS — J9610 Chronic respiratory failure, unspecified whether with hypoxia or hypercapnia: Secondary | ICD-10-CM | POA: Diagnosis not present

## 2023-03-19 DIAGNOSIS — R2681 Unsteadiness on feet: Secondary | ICD-10-CM | POA: Diagnosis not present

## 2023-03-19 DIAGNOSIS — F0390 Unspecified dementia without behavioral disturbance: Secondary | ICD-10-CM | POA: Diagnosis not present

## 2023-03-19 DIAGNOSIS — G309 Alzheimer's disease, unspecified: Secondary | ICD-10-CM | POA: Diagnosis not present

## 2023-03-19 DIAGNOSIS — J441 Chronic obstructive pulmonary disease with (acute) exacerbation: Secondary | ICD-10-CM | POA: Diagnosis not present

## 2023-03-19 DIAGNOSIS — N179 Acute kidney failure, unspecified: Secondary | ICD-10-CM | POA: Diagnosis not present

## 2023-03-19 DIAGNOSIS — M6281 Muscle weakness (generalized): Secondary | ICD-10-CM | POA: Diagnosis not present

## 2023-03-19 DIAGNOSIS — L988 Other specified disorders of the skin and subcutaneous tissue: Secondary | ICD-10-CM | POA: Diagnosis not present

## 2023-03-19 DIAGNOSIS — L0231 Cutaneous abscess of buttock: Secondary | ICD-10-CM | POA: Diagnosis not present

## 2023-03-19 DIAGNOSIS — Z9181 History of falling: Secondary | ICD-10-CM | POA: Diagnosis not present

## 2023-03-22 DIAGNOSIS — J441 Chronic obstructive pulmonary disease with (acute) exacerbation: Secondary | ICD-10-CM | POA: Diagnosis not present

## 2023-03-22 DIAGNOSIS — R2689 Other abnormalities of gait and mobility: Secondary | ICD-10-CM | POA: Diagnosis not present

## 2023-03-22 DIAGNOSIS — Z9181 History of falling: Secondary | ICD-10-CM | POA: Diagnosis not present

## 2023-03-22 DIAGNOSIS — M6281 Muscle weakness (generalized): Secondary | ICD-10-CM | POA: Diagnosis not present

## 2023-03-22 DIAGNOSIS — F0390 Unspecified dementia without behavioral disturbance: Secondary | ICD-10-CM | POA: Diagnosis not present

## 2023-03-22 DIAGNOSIS — R2681 Unsteadiness on feet: Secondary | ICD-10-CM | POA: Diagnosis not present

## 2023-03-26 DIAGNOSIS — L988 Other specified disorders of the skin and subcutaneous tissue: Secondary | ICD-10-CM | POA: Diagnosis not present

## 2023-03-26 DIAGNOSIS — M6281 Muscle weakness (generalized): Secondary | ICD-10-CM | POA: Diagnosis not present

## 2023-03-26 DIAGNOSIS — F0390 Unspecified dementia without behavioral disturbance: Secondary | ICD-10-CM | POA: Diagnosis not present

## 2023-03-26 DIAGNOSIS — Z9181 History of falling: Secondary | ICD-10-CM | POA: Diagnosis not present

## 2023-03-26 DIAGNOSIS — R2689 Other abnormalities of gait and mobility: Secondary | ICD-10-CM | POA: Diagnosis not present

## 2023-03-26 DIAGNOSIS — L0231 Cutaneous abscess of buttock: Secondary | ICD-10-CM | POA: Diagnosis not present

## 2023-03-26 DIAGNOSIS — N179 Acute kidney failure, unspecified: Secondary | ICD-10-CM | POA: Diagnosis not present

## 2023-03-26 DIAGNOSIS — R2681 Unsteadiness on feet: Secondary | ICD-10-CM | POA: Diagnosis not present

## 2023-03-26 DIAGNOSIS — J441 Chronic obstructive pulmonary disease with (acute) exacerbation: Secondary | ICD-10-CM | POA: Diagnosis not present

## 2023-03-29 ENCOUNTER — Other Ambulatory Visit: Payer: Self-pay | Admitting: *Deleted

## 2023-03-29 DIAGNOSIS — M6281 Muscle weakness (generalized): Secondary | ICD-10-CM | POA: Diagnosis not present

## 2023-03-29 DIAGNOSIS — Z9181 History of falling: Secondary | ICD-10-CM | POA: Diagnosis not present

## 2023-03-29 DIAGNOSIS — R2689 Other abnormalities of gait and mobility: Secondary | ICD-10-CM | POA: Diagnosis not present

## 2023-03-29 DIAGNOSIS — R2681 Unsteadiness on feet: Secondary | ICD-10-CM | POA: Diagnosis not present

## 2023-03-29 DIAGNOSIS — F0390 Unspecified dementia without behavioral disturbance: Secondary | ICD-10-CM | POA: Diagnosis not present

## 2023-03-29 DIAGNOSIS — J441 Chronic obstructive pulmonary disease with (acute) exacerbation: Secondary | ICD-10-CM | POA: Diagnosis not present

## 2023-03-29 NOTE — Patient Outreach (Signed)
Late entry for 03/28/23  Mrs. Hobdy resides in [facility] skilled nursing facility. Screening for potential care coordination services as benefit of health plan and PCP.   Spoke with [facility] psychotherapist. Anticipated transition plan is Memory Care vs ALF vs LTC post skilled nursing facility.   Will continue to follow.   Raiford Noble, MSN, RN,BSN [facility] Post Acute Care Coordinator [phone] (Direct dial)

## 2023-04-02 DIAGNOSIS — F0390 Unspecified dementia without behavioral disturbance: Secondary | ICD-10-CM | POA: Diagnosis not present

## 2023-04-02 DIAGNOSIS — J441 Chronic obstructive pulmonary disease with (acute) exacerbation: Secondary | ICD-10-CM | POA: Diagnosis not present

## 2023-04-02 DIAGNOSIS — M6281 Muscle weakness (generalized): Secondary | ICD-10-CM | POA: Diagnosis not present

## 2023-04-02 DIAGNOSIS — Z9181 History of falling: Secondary | ICD-10-CM | POA: Diagnosis not present

## 2023-04-02 DIAGNOSIS — R2681 Unsteadiness on feet: Secondary | ICD-10-CM | POA: Diagnosis not present

## 2023-04-02 DIAGNOSIS — R2689 Other abnormalities of gait and mobility: Secondary | ICD-10-CM | POA: Diagnosis not present

## 2023-04-03 ENCOUNTER — Telehealth: Payer: Self-pay | Admitting: Internal Medicine

## 2023-04-03 NOTE — Telephone Encounter (Signed)
Patient's sone ([name])  dropped off document  [employer] Forms , to be filled out by provider. Patient requested to send it back via EMAIL within 7-days. Document is located in providers tray at front office.Please advise at Mobile [phone]  Please call [name] for questions [phone]  When form is complete please scan it to [name]'s email:  [name]

## 2023-04-04 ENCOUNTER — Emergency Department (HOSPITAL_COMMUNITY): Payer: Medicare Other

## 2023-04-04 ENCOUNTER — Inpatient Hospital Stay (HOSPITAL_COMMUNITY)
Admission: EM | Admit: 2023-04-04 | Discharge: 2023-04-08 | DRG: 682 | Disposition: A | Discharge status: Skilled Nursing Facility | Payer: Medicare Other | Source: Skilled Nursing Facility | Attending: Internal Medicine | Admitting: Internal Medicine

## 2023-04-04 ENCOUNTER — Other Ambulatory Visit: Payer: Self-pay

## 2023-04-04 ENCOUNTER — Encounter (HOSPITAL_COMMUNITY): Payer: Self-pay | Admitting: Internal Medicine

## 2023-04-04 DIAGNOSIS — Z86010 Personal history of colonic polyps: Secondary | ICD-10-CM

## 2023-04-04 DIAGNOSIS — R2689 Other abnormalities of gait and mobility: Secondary | ICD-10-CM | POA: Diagnosis not present

## 2023-04-04 DIAGNOSIS — I6782 Cerebral ischemia: Secondary | ICD-10-CM | POA: Diagnosis not present

## 2023-04-04 DIAGNOSIS — E1151 Type 2 diabetes mellitus with diabetic peripheral angiopathy without gangrene: Secondary | ICD-10-CM | POA: Diagnosis not present

## 2023-04-04 DIAGNOSIS — I10 Essential (primary) hypertension: Secondary | ICD-10-CM | POA: Diagnosis not present

## 2023-04-04 DIAGNOSIS — E860 Dehydration: Secondary | ICD-10-CM | POA: Diagnosis present

## 2023-04-04 DIAGNOSIS — F0280 Dementia in other diseases classified elsewhere without behavioral disturbance: Secondary | ICD-10-CM | POA: Diagnosis present

## 2023-04-04 DIAGNOSIS — G301 Alzheimer's disease with late onset: Secondary | ICD-10-CM | POA: Diagnosis not present

## 2023-04-04 DIAGNOSIS — D72829 Elevated white blood cell count, unspecified: Secondary | ICD-10-CM | POA: Diagnosis not present

## 2023-04-04 DIAGNOSIS — Z634 Disappearance and death of family member: Secondary | ICD-10-CM

## 2023-04-04 DIAGNOSIS — N390 Urinary tract infection, site not specified: Secondary | ICD-10-CM | POA: Diagnosis present

## 2023-04-04 DIAGNOSIS — J9610 Chronic respiratory failure, unspecified whether with hypoxia or hypercapnia: Secondary | ICD-10-CM | POA: Diagnosis not present

## 2023-04-04 DIAGNOSIS — B961 Klebsiella pneumoniae [K. pneumoniae] as the cause of diseases classified elsewhere: Secondary | ICD-10-CM | POA: Diagnosis not present

## 2023-04-04 DIAGNOSIS — Z888 Allergy status to other drugs, medicaments and biological substances status: Secondary | ICD-10-CM

## 2023-04-04 DIAGNOSIS — R0902 Hypoxemia: Secondary | ICD-10-CM | POA: Diagnosis not present

## 2023-04-04 DIAGNOSIS — K219 Gastro-esophageal reflux disease without esophagitis: Secondary | ICD-10-CM | POA: Diagnosis present

## 2023-04-04 DIAGNOSIS — E785 Hyperlipidemia, unspecified: Secondary | ICD-10-CM | POA: Diagnosis present

## 2023-04-04 DIAGNOSIS — Z7401 Bed confinement status: Secondary | ICD-10-CM | POA: Diagnosis not present

## 2023-04-04 DIAGNOSIS — M8589 Other specified disorders of bone density and structure, multiple sites: Secondary | ICD-10-CM | POA: Diagnosis not present

## 2023-04-04 DIAGNOSIS — F02B0 Dementia in other diseases classified elsewhere, moderate, without behavioral disturbance, psychotic disturbance, mood disturbance, and anxiety: Secondary | ICD-10-CM | POA: Diagnosis not present

## 2023-04-04 DIAGNOSIS — R4182 Altered mental status, unspecified: Secondary | ICD-10-CM | POA: Diagnosis present

## 2023-04-04 DIAGNOSIS — E1165 Type 2 diabetes mellitus with hyperglycemia: Secondary | ICD-10-CM | POA: Diagnosis not present

## 2023-04-04 DIAGNOSIS — N179 Acute kidney failure, unspecified: Secondary | ICD-10-CM | POA: Diagnosis not present

## 2023-04-04 DIAGNOSIS — I959 Hypotension, unspecified: Secondary | ICD-10-CM | POA: Diagnosis not present

## 2023-04-04 DIAGNOSIS — R1312 Dysphagia, oropharyngeal phase: Secondary | ICD-10-CM | POA: Diagnosis not present

## 2023-04-04 DIAGNOSIS — Z9842 Cataract extraction status, left eye: Secondary | ICD-10-CM

## 2023-04-04 DIAGNOSIS — Z515 Encounter for palliative care: Secondary | ICD-10-CM

## 2023-04-04 DIAGNOSIS — R410 Disorientation, unspecified: Secondary | ICD-10-CM

## 2023-04-04 DIAGNOSIS — Z79890 Hormone replacement therapy (postmenopausal): Secondary | ICD-10-CM

## 2023-04-04 DIAGNOSIS — J9601 Acute respiratory failure with hypoxia: Secondary | ICD-10-CM | POA: Diagnosis not present

## 2023-04-04 DIAGNOSIS — Z800 Family history of malignant neoplasm of digestive organs: Secondary | ICD-10-CM

## 2023-04-04 DIAGNOSIS — E039 Hypothyroidism, unspecified: Secondary | ICD-10-CM | POA: Diagnosis not present

## 2023-04-04 DIAGNOSIS — Z885 Allergy status to narcotic agent status: Secondary | ICD-10-CM | POA: Diagnosis not present

## 2023-04-04 DIAGNOSIS — G9340 Encephalopathy, unspecified: Secondary | ICD-10-CM | POA: Insufficient documentation

## 2023-04-04 DIAGNOSIS — I700 Atherosclerosis of aorta: Secondary | ICD-10-CM | POA: Diagnosis not present

## 2023-04-04 DIAGNOSIS — R2681 Unsteadiness on feet: Secondary | ICD-10-CM | POA: Diagnosis not present

## 2023-04-04 DIAGNOSIS — N289 Disorder of kidney and ureter, unspecified: Secondary | ICD-10-CM | POA: Diagnosis not present

## 2023-04-04 DIAGNOSIS — Z1152 Encounter for screening for COVID-19: Secondary | ICD-10-CM

## 2023-04-04 DIAGNOSIS — Z8249 Family history of ischemic heart disease and other diseases of the circulatory system: Secondary | ICD-10-CM

## 2023-04-04 DIAGNOSIS — M6281 Muscle weakness (generalized): Secondary | ICD-10-CM | POA: Diagnosis not present

## 2023-04-04 DIAGNOSIS — I4891 Unspecified atrial fibrillation: Secondary | ICD-10-CM | POA: Diagnosis not present

## 2023-04-04 DIAGNOSIS — Z9049 Acquired absence of other specified parts of digestive tract: Secondary | ICD-10-CM

## 2023-04-04 DIAGNOSIS — G309 Alzheimer's disease, unspecified: Secondary | ICD-10-CM | POA: Diagnosis present

## 2023-04-04 DIAGNOSIS — J449 Chronic obstructive pulmonary disease, unspecified: Secondary | ICD-10-CM | POA: Diagnosis not present

## 2023-04-04 DIAGNOSIS — Z9181 History of falling: Secondary | ICD-10-CM | POA: Diagnosis not present

## 2023-04-04 DIAGNOSIS — R000 Tachycardia, unspecified: Secondary | ICD-10-CM | POA: Diagnosis not present

## 2023-04-04 DIAGNOSIS — Z794 Long term (current) use of insulin: Secondary | ICD-10-CM

## 2023-04-04 DIAGNOSIS — Z961 Presence of intraocular lens: Secondary | ICD-10-CM | POA: Diagnosis present

## 2023-04-04 DIAGNOSIS — R319 Hematuria, unspecified: Secondary | ICD-10-CM | POA: Diagnosis present

## 2023-04-04 DIAGNOSIS — Z789 Other specified health status: Secondary | ICD-10-CM | POA: Diagnosis not present

## 2023-04-04 DIAGNOSIS — R531 Weakness: Secondary | ICD-10-CM | POA: Diagnosis not present

## 2023-04-04 DIAGNOSIS — Z8711 Personal history of peptic ulcer disease: Secondary | ICD-10-CM

## 2023-04-04 DIAGNOSIS — Z7984 Long term (current) use of oral hypoglycemic drugs: Secondary | ICD-10-CM

## 2023-04-04 DIAGNOSIS — F17210 Nicotine dependence, cigarettes, uncomplicated: Secondary | ICD-10-CM | POA: Diagnosis present

## 2023-04-04 DIAGNOSIS — N3001 Acute cystitis with hematuria: Secondary | ICD-10-CM | POA: Diagnosis not present

## 2023-04-04 DIAGNOSIS — E876 Hypokalemia: Secondary | ICD-10-CM | POA: Diagnosis present

## 2023-04-04 DIAGNOSIS — G9341 Metabolic encephalopathy: Secondary | ICD-10-CM | POA: Diagnosis not present

## 2023-04-04 DIAGNOSIS — Z87440 Personal history of urinary (tract) infections: Secondary | ICD-10-CM

## 2023-04-04 DIAGNOSIS — Z66 Do not resuscitate: Secondary | ICD-10-CM | POA: Diagnosis not present

## 2023-04-04 DIAGNOSIS — R001 Bradycardia, unspecified: Secondary | ICD-10-CM | POA: Diagnosis present

## 2023-04-04 DIAGNOSIS — Z7982 Long term (current) use of aspirin: Secondary | ICD-10-CM

## 2023-04-04 DIAGNOSIS — Z79899 Other long term (current) drug therapy: Secondary | ICD-10-CM

## 2023-04-04 DIAGNOSIS — Z9841 Cataract extraction status, right eye: Secondary | ICD-10-CM

## 2023-04-04 DIAGNOSIS — Z90710 Acquired absence of both cervix and uterus: Secondary | ICD-10-CM

## 2023-04-04 DIAGNOSIS — Z7189 Other specified counseling: Secondary | ICD-10-CM | POA: Diagnosis not present

## 2023-04-04 DIAGNOSIS — F419 Anxiety disorder, unspecified: Secondary | ICD-10-CM | POA: Diagnosis present

## 2023-04-04 DIAGNOSIS — K760 Fatty (change of) liver, not elsewhere classified: Secondary | ICD-10-CM | POA: Diagnosis not present

## 2023-04-04 LAB — CBC WITH DIFFERENTIAL/PLATELET
Abs Immature Granulocytes: 0.09 10*3/uL — ABNORMAL HIGH (ref 0.00–0.07)
Basophils Absolute: 0.1 10*3/uL (ref 0.0–0.1)
Basophils Relative: 0 %
Eosinophils Absolute: 0.1 10*3/uL (ref 0.0–0.5)
Eosinophils Relative: 0 %
HCT: 47.5 % — ABNORMAL HIGH (ref 36.0–46.0)
Hemoglobin: 15.7 g/dL — ABNORMAL HIGH (ref 12.0–15.0)
Immature Granulocytes: 1 %
Lymphocytes Relative: 31 %
Lymphs Abs: 4.1 10*3/uL — ABNORMAL HIGH (ref 0.7–4.0)
MCH: 29.6 pg (ref 26.0–34.0)
MCHC: 33.1 g/dL (ref 30.0–36.0)
MCV: 89.5 fL (ref 80.0–100.0)
Monocytes Absolute: 0.9 10*3/uL (ref 0.1–1.0)
Monocytes Relative: 7 %
Neutro Abs: 8.1 10*3/uL — ABNORMAL HIGH (ref 1.7–7.7)
Neutrophils Relative %: 61 %
Platelets: 94 10*3/uL — ABNORMAL LOW (ref 150–400)
RBC: 5.31 MIL/uL — ABNORMAL HIGH (ref 3.87–5.11)
RDW: 13 % (ref 11.5–15.5)
WBC: 13.4 10*3/uL — ABNORMAL HIGH (ref 4.0–10.5)
nRBC: 0 % (ref 0.0–0.2)

## 2023-04-04 LAB — COMPREHENSIVE METABOLIC PANEL
ALT: 45 U/L — ABNORMAL HIGH (ref 0–44)
AST: 37 U/L (ref 15–41)
Albumin: 2.7 g/dL — ABNORMAL LOW (ref 3.5–5.0)
Alkaline Phosphatase: 62 U/L (ref 38–126)
Anion gap: 14 (ref 5–15)
BUN: 51 mg/dL — ABNORMAL HIGH (ref 8–23)
CO2: 30 mmol/L (ref 22–32)
Calcium: 6.8 mg/dL — ABNORMAL LOW (ref 8.9–10.3)
Chloride: 98 mmol/L (ref 98–111)
Creatinine, Ser: 1.94 mg/dL — ABNORMAL HIGH (ref 0.44–1.00)
GFR, Estimated: 25 mL/min — ABNORMAL LOW (ref >60)
Glucose, Bld: 146 mg/dL — ABNORMAL HIGH (ref 70–99)
Potassium: 2.8 mmol/L — ABNORMAL LOW (ref 3.5–5.1)
Sodium: 142 mmol/L (ref 135–145)
Total Bilirubin: 1.8 mg/dL — ABNORMAL HIGH (ref 0.3–1.2)
Total Protein: 5.1 g/dL — ABNORMAL LOW (ref 6.5–8.1)

## 2023-04-04 LAB — URINALYSIS, ROUTINE W REFLEX MICROSCOPIC
Bilirubin Urine: NEGATIVE
Glucose, UA: NEGATIVE mg/dL
Hgb urine dipstick: NEGATIVE
Ketones, ur: 5 mg/dL — AB
Nitrite: NEGATIVE
Protein, ur: NEGATIVE mg/dL
Specific Gravity, Urine: 1.016 (ref 1.005–1.030)
WBC, UA: >50 WBC/hpf (ref 0–5)
pH: 5 (ref 5.0–8.0)

## 2023-04-04 LAB — BLOOD GAS, VENOUS
Acid-Base Excess: 8.5 mmol/L — ABNORMAL HIGH (ref 0.0–2.0)
Bicarbonate: 32.8 mmol/L — ABNORMAL HIGH (ref 20.0–28.0)
Drawn by: 7754
O2 Saturation: 94.1 %
Patient temperature: 36.4
pCO2, Ven: 42 mmHg — ABNORMAL LOW (ref 44–60)
pH, Ven: 7.5 — ABNORMAL HIGH (ref 7.25–7.43)
pO2, Ven: 62 mmHg — ABNORMAL HIGH (ref 32–45)

## 2023-04-04 LAB — CBG MONITORING, ED: Glucose-Capillary: 137 mg/dL — ABNORMAL HIGH (ref 70–99)

## 2023-04-04 LAB — SARS CORONAVIRUS 2 BY RT PCR: SARS Coronavirus 2 by RT PCR: NEGATIVE

## 2023-04-04 LAB — MAGNESIUM: Magnesium: 0.8 mg/dL — CL (ref 1.7–2.4)

## 2023-04-04 LAB — GLUCOSE, CAPILLARY: Glucose-Capillary: 155 mg/dL — ABNORMAL HIGH (ref 70–99)

## 2023-04-04 MED ORDER — SODIUM CHLORIDE 0.9 % IV SOLN
1.0000 g | INTRAVENOUS | Status: DC
  Administered 2023-04-04 – 2023-04-07 (×4): 1 g via INTRAVENOUS
  Filled 2023-04-04 (×4): qty 10

## 2023-04-04 MED ORDER — ONDANSETRON HCL 4 MG PO TABS: 4.0000 mg | ORAL_TABLET | Freq: Four times a day (QID) | ORAL | Status: DC | PRN

## 2023-04-04 MED ORDER — UMECLIDINIUM BROMIDE 62.5 MCG/ACT IN AEPB
1.0000 | INHALATION_SPRAY | Freq: Every day | RESPIRATORY_TRACT | Status: DC
  Administered 2023-04-05 – 2023-04-08 (×4): 1 via RESPIRATORY_TRACT
  Filled 2023-04-04: qty 7

## 2023-04-04 MED ORDER — MAGNESIUM SULFATE 4 GM/100ML IV SOLN
4.0000 g | Freq: Once | INTRAVENOUS | Status: AC
  Administered 2023-04-05: 4 g via INTRAVENOUS
  Filled 2023-04-04: qty 100

## 2023-04-04 MED ORDER — LACTATED RINGERS IV BOLUS
1000.0000 mL | Freq: Once | INTRAVENOUS | Status: AC
  Administered 2023-04-04: 1000 mL via INTRAVENOUS

## 2023-04-04 MED ORDER — CALCIUM GLUCONATE-NACL 1-0.675 GM/50ML-% IV SOLN
1.0000 g | Freq: Once | INTRAVENOUS | Status: AC
  Administered 2023-04-04: 1000 mg via INTRAVENOUS
  Filled 2023-04-04 (×2): qty 50

## 2023-04-04 MED ORDER — FENTANYL CITRATE PF 50 MCG/ML IJ SOSY: 12.5000 ug | PREFILLED_SYRINGE | INTRAMUSCULAR | Status: DC | PRN

## 2023-04-04 MED ORDER — DONEPEZIL HCL 10 MG PO TABS
10.0000 mg | ORAL_TABLET | Freq: Every day | ORAL | Status: DC
  Administered 2023-04-04 – 2023-04-07 (×4): 10 mg via ORAL
  Filled 2023-04-04 (×4): qty 1

## 2023-04-04 MED ORDER — POTASSIUM CHLORIDE CRYS ER 20 MEQ PO TBCR
40.0000 meq | EXTENDED_RELEASE_TABLET | ORAL | Status: AC
  Administered 2023-04-04: 40 meq via ORAL
  Filled 2023-04-04 (×2): qty 2

## 2023-04-04 MED ORDER — ASPIRIN 81 MG PO TBEC
81.0000 mg | DELAYED_RELEASE_TABLET | Freq: Every day | ORAL | Status: DC
  Administered 2023-04-04: 81 mg via ORAL
  Filled 2023-04-04 (×2): qty 1

## 2023-04-04 MED ORDER — LORATADINE 10 MG PO TABS
10.0000 mg | ORAL_TABLET | Freq: Every day | ORAL | Status: DC
  Administered 2023-04-04 – 2023-04-08 (×5): 10 mg via ORAL
  Filled 2023-04-04 (×5): qty 1

## 2023-04-04 MED ORDER — LEVOTHYROXINE SODIUM 25 MCG PO TABS
50.0000 ug | ORAL_TABLET | Freq: Every day | ORAL | Status: DC
  Administered 2023-04-05 – 2023-04-08 (×4): 50 ug via ORAL
  Filled 2023-04-04 (×4): qty 2

## 2023-04-04 MED ORDER — HYDRALAZINE HCL 20 MG/ML IJ SOLN: 2.0000 mg | Freq: Four times a day (QID) | INTRAMUSCULAR | Status: DC | PRN

## 2023-04-04 MED ORDER — PANTOPRAZOLE SODIUM 40 MG PO TBEC
40.0000 mg | DELAYED_RELEASE_TABLET | Freq: Every day | ORAL | Status: DC
  Filled 2023-04-04: qty 1

## 2023-04-04 MED ORDER — SODIUM CHLORIDE 0.9 % IV SOLN: INTRAVENOUS | Status: AC

## 2023-04-04 MED ORDER — FLUTICASONE PROPIONATE 50 MCG/ACT NA SUSP
2.0000 | Freq: Every day | NASAL | Status: DC
  Administered 2023-04-05 – 2023-04-08 (×4): 2 via NASAL
  Filled 2023-04-04: qty 16

## 2023-04-04 MED ORDER — ACETAMINOPHEN 325 MG PO TABS
650.0000 mg | ORAL_TABLET | Freq: Four times a day (QID) | ORAL | Status: DC | PRN
  Administered 2023-04-04 – 2023-04-07 (×4): 650 mg via ORAL
  Filled 2023-04-04 (×4): qty 2

## 2023-04-04 MED ORDER — ENSURE ENLIVE PO LIQD
237.0000 mL | Freq: Two times a day (BID) | ORAL | Status: DC
  Administered 2023-04-05 – 2023-04-08 (×7): 237 mL via ORAL

## 2023-04-04 MED ORDER — SIMVASTATIN 20 MG PO TABS
40.0000 mg | ORAL_TABLET | Freq: Every day | ORAL | Status: DC
  Administered 2023-04-04 – 2023-04-07 (×4): 40 mg via ORAL
  Filled 2023-04-04 (×5): qty 2

## 2023-04-04 MED ORDER — HEPARIN SODIUM (PORCINE) 5000 UNIT/ML IJ SOLN
5000.0000 [IU] | Freq: Two times a day (BID) | INTRAMUSCULAR | Status: DC
  Administered 2023-04-04 – 2023-04-05 (×2): 5000 [IU] via SUBCUTANEOUS
  Filled 2023-04-04 (×2): qty 1

## 2023-04-04 MED ORDER — IPRATROPIUM-ALBUTEROL 0.5-2.5 (3) MG/3ML IN SOLN: 3.0000 mL | Freq: Four times a day (QID) | RESPIRATORY_TRACT | Status: DC | PRN

## 2023-04-04 MED ORDER — MEMANTINE HCL 10 MG PO TABS
10.0000 mg | ORAL_TABLET | Freq: Two times a day (BID) | ORAL | Status: DC
  Administered 2023-04-04 – 2023-04-08 (×8): 10 mg via ORAL
  Filled 2023-04-04 (×8): qty 1

## 2023-04-04 NOTE — H&P (Signed)
History and Physical    [name]:[id number] DOB: 1936-01-02 DOA: 04/04/2023  PCP: [name], MD (Confirm with patient/family/NH records and if not entered, this has to be entered at [facility] point of entry) Patient coming from: SNF  I have personally briefly reviewed patient's old medical records in [facility]  Chief Complaint: Patient is confused.  HPI: [name] is a 87 y.o. female with medical history significant of advanced dementia, HTN HLD, COPD, hypothyroidism, sent from nursing home for evaluation of acute confusion.  Patient is confused and has baseline dementia, as result she is unable to provide any meaningful history, or history provided by son over the phone and by reviewing nursing home record.  Son reported the patient does have a chronic recurrent diarrhea/IBS, and had a flareup last week and patient has severe diarrhea while have to wearing a diaper.  Over the weekend patient also has been having decreased oral intake and did not eat or drink whole day yesterday.  This morning patient was found to be confused by the home staff.  At baseline, patient has advanced dementia with her main caregiver husband passed away on 06/25/2024and since then patient has been staying at her nursing home in [name] area.  ED Course: Afebrile, borderline tachycardia nonhypotensive.  Blood work showed AKI with creatinine 1.9 compared to baseline 0.9, K2.8, calcium corrected 7.4, WBC 15.7 platelet 94.  Chest x-ray is negative for acute infiltrates, UA showed pyuria.  Patient was given 1000 IV bolus in the ED.  Review of Systems: Unable to perform, patient is confused.  Past Medical History:  Diagnosis Date   Anxiety    COPD (chronic obstructive pulmonary disease) (HCC)    Dementia (HCC)    Diverticulosis of colon (without mention of hemorrhage)    Gallstones    GERD (gastroesophageal reflux disease)    Hiatal hernia    Hyperlipidemia     Hypertension    Hypothyroidism    Low back pain    OA R radiculopathy   Lower extremity edema    chronic   Osteopenia    Peptic ulcer    Peripheral vascular disease (HCC)    Personal history of colonic polyps 2001   TUBULAR ADENOMA - [place]   Pneumonia     Past Surgical History:  Procedure Laterality Date   ABDOMINAL ADHESION SURGERY     ABDOMINAL HYSTERECTOMY     APPENDECTOMY     CARPAL TUNNEL RELEASE Bilateral    CATARACT EXTRACTION W/ INTRAOCULAR LENS  IMPLANT, BILATERAL     CHOLECYSTECTOMY     NECK SURGERY     bone and plate    ROTATOR CUFF REPAIR Bilateral    TUBAL LIGATION       reports that she has been smoking cigarettes. She has a 91.50 pack-year smoking history. She has never used smokeless tobacco. She reports that she does not drink alcohol and does not use drugs.  Allergies  Allergen Reactions   Codeine Sulfate Other (See Comments)    Extreme headaches   Exelon [Rivastigmine Tartrate]     nausea   Oxycodone-Acetaminophen Other (See Comments)    Extreme headaches    Family History  Problem Relation Age of Onset   Dementia Mother    Hypertension Father    Heart disease Father    Colon cancer Sister    Heart disease Sister    Esophageal cancer Neg Hx    Rectal cancer Neg Hx  Stomach cancer Neg Hx      Prior to Admission medications   Medication Sig Start Date End Date Taking? Authorizing Provider  acetaminophen (TYLENOL) 325 MG tablet Take 650 mg by mouth every 6 (six) hours as needed.    [provider]  aspirin 81 MG EC tablet Take 81 mg by mouth daily.    [provider]  Cholecalciferol (VITAMIN D3) 2000 units capsule Take 1 capsule (2,000 Units total) daily by mouth. 08/15/17   Plotnikov, Georgina Quint, MD  Diclofenac Sodium 1.5 % SOLN Place 3-5 drops onto the skin 3 (three) times daily as needed. For pain applies on joints    [provider]  donepezil (ARICEPT) 10 MG tablet Take 1 tablet (10 mg total) by mouth at  bedtime. Annual appt due in Oct must see provider for future refills 05/01/22   Drema Dallas, DO  fluticasone (FLONASE) 50 MCG/ACT nasal spray Place 2 sprays into both nostrils daily. 03/08/23   Marguerita Merles Latif, DO  furosemide (LASIX) 20 MG tablet Take 2 tablets (40 mg total) by mouth daily. 07/24/22   Plotnikov, Georgina Quint, MD  ipratropium-albuterol (DUONEB) 0.5-2.5 (3) MG/3ML SOLN Take 3 mLs by nebulization every 6 (six) hours as needed. 03/07/23   Marguerita Merles Latif, DO  loratadine (CLARITIN) 10 MG tablet Take 1 tablet (10 mg total) by mouth daily. 03/08/23   Marguerita Merles Latif, DO  losartan (COZAAR) 50 MG tablet Take 1 tablet (50 mg total) by mouth daily. 06/20/22   Plotnikov, Georgina Quint, MD  memantine (NAMENDA) 10 MG tablet Take 1 tablet (10 mg total) by mouth 2 (two) times daily. Follow-up appt due in August must see provider for future refills 05/01/22   Drema Dallas, DO  Multiple Vitamin (MULTIVITAMIN) tablet Take 1 tablet by mouth daily.    [provider]  omeprazole (PRILOSEC) 40 MG capsule TAKE 1 CAPSULE DAILY Patient taking differently: Take 40 mg by mouth daily. 07/27/22   Plotnikov, Georgina Quint, MD  ondansetron (ZOFRAN) 4 MG tablet Take 1 tablet (4 mg total) by mouth every 6 (six) hours as needed. Take  1 tab every 6 hours as needed for nausea and vomiting. 11/03/20   Plotnikov, Georgina Quint, MD  predniSONE (STERAPRED UNI-PAK 21 TAB) 10 MG (21) TBPK tablet Take 6 tablets on day 1, 5 tablets on day 2, 4 tablets on day 3, 3 tablets on day 4, 2 tablets on day 5, 1 tablet on day 6 and then Stop on Day 7 03/07/23   Marguerita Merles Latif, DO  simvastatin (ZOCOR) 40 MG tablet Take 1 tablet (40 mg total) by mouth daily at 6 PM. 06/20/22   Plotnikov, Georgina Quint, MD  SYNTHROID 50 MCG tablet Take 1 tablet (50 mcg total) by mouth daily before breakfast. 07/14/22   Plotnikov, Georgina Quint, MD  umeclidinium bromide (INCRUSE ELLIPTA) 62.5 MCG/ACT AEPB Inhale 1 puff into the lungs daily. 06/29/22   Plotnikov,  Georgina Quint, MD    Physical Exam: Vitals:   04/04/23 1359 04/04/23 1615 04/04/23 1630 04/04/23 1819  BP:  (!) 117/90 121/69   Pulse:  (!) 104 (!) 57   Resp:   18   Temp: 98.8 F (37.1 C)   98 F (36.7 C)  TempSrc: Rectal     SpO2:  98% 97%     Constitutional: NAD, calm, comfortable Vitals:   04/04/23 1359 04/04/23 1615 04/04/23 1630 04/04/23 1819  BP:  (!) 117/90 121/69   Pulse:  (!) 104 Marland Kitchen)  57   Resp:   18   Temp: 98.8 F (37.1 C)   98 F (36.7 C)  TempSrc: Rectal     SpO2:  98% 97%    Eyes: PERRL, lids and conjunctivae normal ENMT: Mucous membranes are dry. Posterior pharynx clear of any exudate or lesions.Normal dentition.  Neck: normal, supple, no masses, no thyromegaly Respiratory: clear to auscultation bilaterally, no wheezing, no crackles. Normal respiratory effort. No accessory muscle use.  Cardiovascular: Regular rate and rhythm, no murmurs / rubs / gallops. No extremity edema. 2+ pedal pulses. No carotid bruits.  Abdomen: no tenderness, no masses palpated. No hepatosplenomegaly. Bowel sounds positive.  Musculoskeletal: no clubbing / cyanosis. No joint deformity upper and lower extremities. Good ROM, no contractures. Normal muscle tone.  Skin: no rashes, lesions, ulcers. No induration Neurologic: Moving all limbs, following simple commands, no focal deficit Psychiatric: Oriented to herself, confused about time and place    Labs on Admission: I have personally reviewed following labs and imaging studies  CBC: Recent Labs  Lab 04/04/23 1338  WBC 13.4*  NEUTROABS 8.1*  HGB 15.7*  HCT 47.5*  MCV 89.5  PLT 94*   Basic Metabolic Panel: Recent Labs  Lab 04/04/23 1338  NA 142  K 2.8*  CL 98  CO2 30  GLUCOSE 146*  BUN 51*  CREATININE 1.94*  CALCIUM 6.8*   GFR: CrCl cannot be calculated (Unknown ideal weight.). Liver Function Tests: Recent Labs  Lab 04/04/23 1338  AST 37  ALT 45*  ALKPHOS 62  BILITOT 1.8*  PROT 5.1*  ALBUMIN 2.7*   No  results for input(s): "LIPASE", "AMYLASE" in the last 168 hours. No results for input(s): "AMMONIA" in the last 168 hours. Coagulation Profile: No results for input(s): "INR", "PROTIME" in the last 168 hours. Cardiac Enzymes: No results for input(s): "CKTOTAL", "CKMB", "CKMBINDEX", "TROPONINI" in the last 168 hours. BNP (last 3 results) No results for input(s): "PROBNP" in the last 8760 hours. HbA1C: No results for input(s): "HGBA1C" in the last 72 hours. CBG: Recent Labs  Lab 04/04/23 1334  GLUCAP 137*   Lipid Profile: No results for input(s): "CHOL", "HDL", "LDLCALC", "TRIG", "CHOLHDL", "LDLDIRECT" in the last 72 hours. Thyroid Function Tests: No results for input(s): "TSH", "T4TOTAL", "FREET4", "T3FREE", "THYROIDAB" in the last 72 hours. Anemia Panel: No results for input(s): "VITAMINB12", "FOLATE", "FERRITIN", "TIBC", "IRON", "RETICCTPCT" in the last 72 hours. Urine analysis:    Component Value Date/Time   COLORURINE YELLOW 04/04/2023 1622   APPEARANCEUR HAZY (A) 04/04/2023 1622   LABSPEC 1.016 04/04/2023 1622   PHURINE 5.0 04/04/2023 1622   GLUCOSEU NEGATIVE 04/04/2023 1622   GLUCOSEU >=1000 (A) 06/13/2022 1223   HGBUR NEGATIVE 04/04/2023 1622   BILIRUBINUR NEGATIVE 04/04/2023 1622   KETONESUR 5 (A) 04/04/2023 1622   PROTEINUR NEGATIVE 04/04/2023 1622   UROBILINOGEN 0.2 06/13/2022 1223   NITRITE NEGATIVE 04/04/2023 1622   LEUKOCYTESUR LARGE (A) 04/04/2023 1622    Radiological Exams on Admission: US RENAL  Result Date: 04/04/2023 CLINICAL DATA:  Acute renal insufficiency, hypertension EXAM: RENAL / URINARY TRACT ULTRASOUND COMPLETE COMPARISON:  None Available. FINDINGS: Right Kidney: Renal measurements: 8.3 x 4.3 x 4.7 cm = volume: 87.1 mL. Echogenicity within normal limits. No mass or hydronephrosis visualized. Left Kidney: Renal measurements: 8.2 x 4.7 x 4.2 cm = volume: 83.7 mL. Echogenicity within normal limits. No mass or hydronephrosis visualized. Bladder: Appears  normal for degree of bladder distention. Other: Incidental splenule noted in the left upper quadrant. Limited imaging of  the liver demonstrates increased parenchymal echotexture compatible with hepatic steatosis. IMPRESSION: 1. Unremarkable renal ultrasound. 2. Hepatic steatosis. Electronically Signed   By: Sharlet Salina M.D.   On: 04/04/2023 18:09   DG Chest Portable 1 View  Result Date: 04/04/2023 CLINICAL DATA:  Hypoxia. EXAM: PORTABLE CHEST 1 VIEW COMPARISON:  Mar 02, 2023. FINDINGS: The heart size and mediastinal contours are within normal limits. Both lungs are clear. The visualized skeletal structures are unremarkable. IMPRESSION: No active disease. Aortic Atherosclerosis (ICD10-I70.0). Electronically Signed   By: Lupita Raider M.D.   On: 04/04/2023 14:30    EKG: Independently reviewed.  Sinus tachycardia, no acute ST changes.  Assessment/Plan Principal Problem:   AMS (altered mental status) Active Problems:   Urinary tract infection with hematuria   Alzheimer's dementia (HCC)   Acute encephalopathy  (please populate well all problems here in Problem List. (For example, if patient is on BP meds at home and you resume or decide to hold them, it is a problem that needs to be her. Same for CAD, COPD, HLD and so on)  Acute metabolic encephalopathy Recurrent UTI with hematuria -Will treat UTI and then reevaluate  AKI -Prerenal with symptoms signs of volume contraction secondary to GI loss -Received IV bolus in ED, will initiate maintenance IV fluid, repeat kidney function tomorrow -Hold off losartan and Lasix  Severe hypokalemia -Likely secondary to GI loss, p.o. replacement, recheck level tomorrow -Magnesium level sent  Hypocalcemia -IV replacement, recheck level tomorrow  HTN -Controlled, hold olmesartan and Lasix -Start as needed hydralazine  Dementia -Continue Aricept and memantine  COPD -Stable, continue Ellipta and as needed DuoNebs  DVT prophylaxis: Heparin  subcu Code Status: Full code Family Communication: Son at bedside Disposition Plan: Expect less than 2 midnight hospital stay Consults called: None Admission status: Tele obs   Emeline General MD [facility] Pager [phone]  04/04/2023, 6:21 PM

## 2023-04-04 NOTE — ED Notes (Signed)
Patient in US.

## 2023-04-04 NOTE — ED Provider Notes (Signed)
[name]   CSN: [id number] Arrival date & time: 04/04/23  1311     History  Chief Complaint  Patient presents with   Shortness of Breath   Altered Mental Status    [name] is a 87 y.o. female.   Shortness of Breath Altered Mental Status Patient with history of dementia.  Reported mental status change.  Reported lab abnormalities.  Came with blood work.  Patient has dementia and cannot not really provide much history.       Home Medications Prior to Admission medications   Medication Sig Start Date End Date Taking? Authorizing Provider  acetaminophen (TYLENOL) 325 MG tablet Take 650 mg by mouth every 6 (six) hours as needed.    [provider]  aspirin 81 MG EC tablet Take 81 mg by mouth daily.    [provider]  Cholecalciferol (VITAMIN D3) 2000 units capsule Take 1 capsule (2,000 Units total) daily by mouth. 08/15/17   Plotnikov, Georgina Quint, MD  Diclofenac Sodium 1.5 % SOLN Place 3-5 drops onto the skin 3 (three) times daily as needed. For pain applies on joints    [provider]  donepezil (ARICEPT) 10 MG tablet Take 1 tablet (10 mg total) by mouth at bedtime. Annual appt due in Oct must see provider for future refills 05/01/22   Drema Dallas, DO  fluticasone (FLONASE) 50 MCG/ACT nasal spray Place 2 sprays into both nostrils daily. 03/08/23   Marguerita Merles Latif, DO  furosemide (LASIX) 20 MG tablet Take 2 tablets (40 mg total) by mouth daily. 07/24/22   Plotnikov, Georgina Quint, MD  ipratropium-albuterol (DUONEB) 0.5-2.5 (3) MG/3ML SOLN Take 3 mLs by nebulization every 6 (six) hours as needed. 03/07/23   Marguerita Merles Latif, DO  loratadine (CLARITIN) 10 MG tablet Take 1 tablet (10 mg total) by mouth daily. 03/08/23   Marguerita Merles Latif, DO  losartan (COZAAR) 50 MG tablet Take 1 tablet (50 mg total) by mouth daily. 06/20/22   Plotnikov, Georgina Quint, MD  memantine (NAMENDA) 10 MG  tablet Take 1 tablet (10 mg total) by mouth 2 (two) times daily. Follow-up appt due in August must see provider for future refills 05/01/22   Drema Dallas, DO  Multiple Vitamin (MULTIVITAMIN) tablet Take 1 tablet by mouth daily.    [provider]  omeprazole (PRILOSEC) 40 MG capsule TAKE 1 CAPSULE DAILY Patient taking differently: Take 40 mg by mouth daily. 07/27/22   Plotnikov, Georgina Quint, MD  ondansetron (ZOFRAN) 4 MG tablet Take 1 tablet (4 mg total) by mouth every 6 (six) hours as needed. Take  1 tab every 6 hours as needed for nausea and vomiting. 11/03/20   Plotnikov, Georgina Quint, MD  predniSONE (STERAPRED UNI-PAK 21 TAB) 10 MG (21) TBPK tablet Take 6 tablets on day 1, 5 tablets on day 2, 4 tablets on day 3, 3 tablets on day 4, 2 tablets on day 5, 1 tablet on day 6 and then Stop on Day 7 03/07/23   Marguerita Merles Latif, DO  simvastatin (ZOCOR) 40 MG tablet Take 1 tablet (40 mg total) by mouth daily at 6 PM. 06/20/22   Plotnikov, Georgina Quint, MD  SYNTHROID 50 MCG tablet Take 1 tablet (50 mcg total) by mouth daily before breakfast. 07/14/22   Plotnikov, Georgina Quint, MD  umeclidinium bromide (INCRUSE ELLIPTA) 62.5 MCG/ACT AEPB Inhale 1 puff into the lungs daily. 06/29/22   Plotnikov, Georgina Quint, MD  Allergies    Codeine sulfate, Exelon [rivastigmine tartrate], and Oxycodone-acetaminophen    Review of Systems   Review of Systems  Respiratory:  Positive for shortness of breath.     Physical Exam Updated Vital Signs BP (!) 119/23 (BP Location: Left Arm)   Pulse (!) 109   Temp 98.8 F (37.1 C) (Rectal)   Resp 20   SpO2 95% Comment: 2L [place] Physical Exam Vitals reviewed.  HENT:     Head: Atraumatic.  Cardiovascular:     Rate and Rhythm: Tachycardia present. Rhythm irregular.  Chest:     Chest wall: No tenderness.  Abdominal:     Tenderness: There is no abdominal tenderness.  Neurological:     Mental Status: She is alert.     Comments: Awake and confused.  Cannot really provide much  history but moves all extremities.     ED Results / Procedures / Treatments   Labs (all labs ordered are listed, but only abnormal results are displayed) Labs Reviewed  COMPREHENSIVE METABOLIC PANEL - Abnormal; Notable for the following components:      Result Value   Potassium 2.8 (*)    Glucose, Bld 146 (*)    BUN 51 (*)    Creatinine, Ser 1.94 (*)    Calcium 6.8 (*)    Total Protein 5.1 (*)    Albumin 2.7 (*)    ALT 45 (*)    Total Bilirubin 1.8 (*)    GFR, Estimated 25 (*)    All other components within normal limits  CBC WITH DIFFERENTIAL/PLATELET - Abnormal; Notable for the following components:   WBC 13.4 (*)    RBC 5.31 (*)    Hemoglobin 15.7 (*)    HCT 47.5 (*)    Platelets 94 (*)    Neutro Abs 8.1 (*)    Lymphs Abs 4.1 (*)    Abs Immature Granulocytes 0.09 (*)    All other components within normal limits  CBG MONITORING, ED - Abnormal; Notable for the following components:   Glucose-Capillary 137 (*)    All other components within normal limits  SARS CORONAVIRUS 2 BY RT PCR  URINALYSIS, ROUTINE W REFLEX MICROSCOPIC    EKG EKG Interpretation  Date/Time:  Wednesday April 04 2023 13:12:42 EDT Ventricular Rate:  99 PR Interval:  130 QRS Duration: 91 QT Interval:  292 QTC Calculation: 375 R Axis:   75 Text Interpretation: Sinus tachycardia Nonspecific repol abnormality, diffuse leads Confirmed by Benjiman Core [phone]) on 04/04/2023 1:45:03 PM  Radiology DG Chest Portable 1 View  Result Date: 04/04/2023 CLINICAL DATA:  Hypoxia. EXAM: PORTABLE CHEST 1 VIEW COMPARISON:  Mar 02, 2023. FINDINGS: The heart size and mediastinal contours are within normal limits. Both lungs are clear. The visualized skeletal structures are unremarkable. IMPRESSION: No active disease. Aortic Atherosclerosis (ICD10-I70.0). Electronically Signed   By: Lupita Raider M.D.   On: 04/04/2023 14:30    Procedures Procedures    Medications Ordered in ED Medications  lactated ringers  bolus 1,000 mL (has no administration in time range)    ED Course/ Medical Decision Making/ A&P                             Medical Decision Making Amount and/or Complexity of Data Reviewed Radiology: ordered.   Patient with mental status change.  Differential diagnosis longer includes dehydration, infection, encephalopathy.  Sent in for abnormal blood work.  Will repeat blood work.  Will  get chest x-ray.  EKG shows sinus rhythm with premature atrial contractions.  Reportedly had hypoxia of 84%.  Improved on nasal cannula.  Will likely require admission in the hospital with new onset hypoxia.      Lab work resulted and does show acute kidney injury dehydration and hypokalemia hypocalcemia.  Does have history of COPD which may account for the hypoxia.  This is similar to recent presentation when she did have UTI.  Urinalysis still pending but will require admission to the hospital.  Will discuss with hospitalist.        Final Clinical Impression(s) / ED Diagnoses Final diagnoses:  AKI (acute kidney injury) (HCC)  Dehydration  Hypokalemia  Hypocalcemia    Rx / DC Orders ED Discharge Orders     None         Benjiman Core, MD 04/04/23 1559

## 2023-04-04 NOTE — ED Notes (Signed)
Got patient into a gown on the monitor did EKG shown to Dr Rubin Payor patient is resting with call bell in reach got patient a warm blanket

## 2023-04-04 NOTE — ED Triage Notes (Addendum)
Pt BIB EMS [facility]. Wanted eval for abnormal labs. Labs came back normal. Reports of AMS, hx of dementia. Reports of fatigue, bed bound. Reports of irreg hr despite no hx of a-fib. Possible new onset of a-fib. VSS. 20g in R.AC. 250 NS from EMS. SpO2 report of 84% on RA, normally does not wear O2.   EMS reported vital signs: 116/54 P: 60-160 RR: 16 SpO2: 85% RA / 96% 2L  CBG: 123 Temp: 97.9

## 2023-04-04 NOTE — ED Notes (Signed)
ED TO INPATIENT HANDOFF REPORT  ED Nurse Name and Phone #:    S Name/Age/Gender [name] 87 y.o. female Room/Bed: 002C/002C  Code Status   Code Status: Full Code  Home/SNF/Other Skilled nursing facility Patient oriented to: self Is this baseline? Yes   Triage Complete: Triage complete  Chief Complaint AMS (altered mental status) [R41.82]  Triage Note Pt BIB EMS [facility]. Wanted eval for abnormal labs. Labs came back normal. Reports of AMS, hx of dementia. Reports of fatigue, bed bound. Reports of irreg hr despite no hx of a-fib. Possible new onset of a-fib. VSS. 20g in R.AC. 250 NS from EMS. SpO2 report of 84% on RA, normally does not wear O2.   EMS reported vital signs: 116/54 P: 60-160 RR: 16 SpO2: 85% RA / 96% 2L  CBG: 123 Temp: 97.9    Allergies Allergies  Allergen Reactions   Codeine Sulfate Other (See Comments)    Extreme headaches   Exelon [Rivastigmine Tartrate]     nausea   Oxycodone-Acetaminophen Other (See Comments)    Extreme headaches    Level of Care/Admitting Diagnosis ED Disposition     ED Disposition  Admit   Condition  --   Comment  Hospital Area: [facility] [[id number]]  Level of Care: Telemetry Medical [104]  May place patient in observation at [facility] or [facility] if equivalent level of care is available:: No  Covid Evaluation: Asymptomatic - no recent exposure (last 10 days) testing not required  Diagnosis: AMS (altered mental status) [[id number]]  Admitting Physician: Emeline General [[id number]]  Attending Physician: Emeline General [[id number]]          B Medical/Surgery History Past Medical History:  Diagnosis Date   Anxiety    COPD (chronic obstructive pulmonary disease) (HCC)    Dementia (HCC)    Diverticulosis of colon (without mention of hemorrhage)    Gallstones    GERD (gastroesophageal reflux disease)    Hiatal hernia    Hyperlipidemia    Hypertension    Hypothyroidism     Low back pain    OA R radiculopathy   Lower extremity edema    chronic   Osteopenia    Peptic ulcer    Peripheral vascular disease (HCC)    Personal history of colonic polyps 2001   TUBULAR ADENOMA - [place]   Pneumonia    Past Surgical History:  Procedure Laterality Date   ABDOMINAL ADHESION SURGERY     ABDOMINAL HYSTERECTOMY     APPENDECTOMY     CARPAL TUNNEL RELEASE Bilateral    CATARACT EXTRACTION W/ INTRAOCULAR LENS  IMPLANT, BILATERAL     CHOLECYSTECTOMY     NECK SURGERY     bone and plate    ROTATOR CUFF REPAIR Bilateral    TUBAL LIGATION       A IV Location/Drains/Wounds Patient Lines/Drains/Airways Status     Active Line/Drains/Airways     Name Placement date Placement time Site Days   Peripheral IV 04/04/23 20 G Right Antecubital 04/04/23  1341  Antecubital  less than 1            Intake/Output Last 24 hours No intake or output data in the 24 hours ending 04/04/23 1814  Labs/Imaging Results for orders placed or performed during the hospital encounter of 04/04/23 (from the past 48 hour(s))  POC CBG, ED     Status: Abnormal   Collection Time: 04/04/23  1:34 PM  Result Value Ref  Range   Glucose-Capillary 137 (H) 70 - 99 mg/dL    Comment: Glucose reference range applies only to samples taken after fasting for at least 8 hours.  Comprehensive metabolic panel     Status: Abnormal   Collection Time: 04/04/23  1:38 PM  Result Value Ref Range   Sodium 142 135 - 145 mmol/L   Potassium 2.8 (L) 3.5 - 5.1 mmol/L   Chloride 98 98 - 111 mmol/L   CO2 30 22 - 32 mmol/L   Glucose, Bld 146 (H) 70 - 99 mg/dL    Comment: Glucose reference range applies only to samples taken after fasting for at least 8 hours.   BUN 51 (H) 8 - 23 mg/dL   Creatinine, Ser 6.96 (H) 0.44 - 1.00 mg/dL   Calcium 6.8 (L) 8.9 - 10.3 mg/dL   Total Protein 5.1 (L) 6.5 - 8.1 g/dL   Albumin 2.7 (L) 3.5 - 5.0 g/dL   AST 37 15 - 41 U/L   ALT 45 (H) 0 - 44 U/L   Alkaline Phosphatase 62 38 -  126 U/L   Total Bilirubin 1.8 (H) 0.3 - 1.2 mg/dL   GFR, Estimated 25 (L) >60 mL/min    Comment: (NOTE) Calculated using the CKD-EPI Creatinine Equation (2021)    Anion gap 14 5 - 15    Comment: Performed at [facility], [address] [place], [place] [address]  CBC with Differential     Status: Abnormal   Collection Time: 04/04/23  1:38 PM  Result Value Ref Range   WBC 13.4 (H) 4.0 - 10.5 K/uL   RBC 5.31 (H) 3.87 - 5.11 MIL/uL   Hemoglobin 15.7 (H) 12.0 - 15.0 g/dL   HCT 41.3 (H) 24.4 - 01.0 %   MCV 89.5 80.0 - 100.0 fL   MCH 29.6 26.0 - 34.0 pg   MCHC 33.1 30.0 - 36.0 g/dL   RDW 27.2 53.6 - 64.4 %   Platelets 94 (L) 150 - 400 K/uL    Comment: Immature Platelet Fraction may be clinically indicated, consider ordering this additional test [id number] REPEATED TO VERIFY    nRBC 0.0 0.0 - 0.2 %   Neutrophils Relative % 61 %   Neutro Abs 8.1 (H) 1.7 - 7.7 K/uL   Lymphocytes Relative 31 %   Lymphs Abs 4.1 (H) 0.7 - 4.0 K/uL   Monocytes Relative 7 %   Monocytes Absolute 0.9 0.1 - 1.0 K/uL   Eosinophils Relative 0 %   Eosinophils Absolute 0.1 0.0 - 0.5 K/uL   Basophils Relative 0 %   Basophils Absolute 0.1 0.0 - 0.1 K/uL   Immature Granulocytes 1 %   Abs Immature Granulocytes 0.09 (H) 0.00 - 0.07 K/uL    Comment: Performed at [facility], [address] [place], [place] [address]  SARS Coronavirus 2 by RT PCR (hospital order, performed in [facility] hospital lab) *cepheid single result test* Anterior Nasal Swab     Status: None   Collection Time: 04/04/23  1:48 PM   Specimen: Anterior Nasal Swab  Result Value Ref Range   SARS Coronavirus 2 by RT PCR NEGATIVE NEGATIVE    Comment: Performed at [facility], [address] [place], [place] [address]  Urinalysis, Routine w reflex microscopic -Urine, Clean Catch     Status: Abnormal   Collection Time: 04/04/23  4:22 PM  Result Value Ref Range   Color, Urine YELLOW YELLOW   APPearance HAZY (A) CLEAR  Specific Gravity, Urine 1.016 1.005 - 1.030   pH 5.0 5.0 - 8.0   Glucose, UA NEGATIVE NEGATIVE mg/dL   Hgb urine dipstick NEGATIVE NEGATIVE   Bilirubin Urine NEGATIVE NEGATIVE   Ketones, ur 5 (A) NEGATIVE mg/dL   Protein, ur NEGATIVE NEGATIVE mg/dL   Nitrite NEGATIVE NEGATIVE   Leukocytes,Ua LARGE (A) NEGATIVE   RBC / HPF 6-10 0 - 5 RBC/hpf   WBC, UA >50 0 - 5 WBC/hpf   Bacteria, UA RARE (A) NONE SEEN   Squamous Epithelial / HPF 0-5 0 - 5 /HPF   Mucus PRESENT    Budding Yeast PRESENT    Non Squamous Epithelial 0-5 (A) NONE SEEN    Comment: Performed at [facility], [address] [place], [place] [address]   US RENAL  Result Date: 04/04/2023 CLINICAL DATA:  Acute renal insufficiency, hypertension EXAM: RENAL / URINARY TRACT ULTRASOUND COMPLETE COMPARISON:  None Available. FINDINGS: Right Kidney: Renal measurements: 8.3 x 4.3 x 4.7 cm = volume: 87.1 mL. Echogenicity within normal limits. No mass or hydronephrosis visualized. Left Kidney: Renal measurements: 8.2 x 4.7 x 4.2 cm = volume: 83.7 mL. Echogenicity within normal limits. No mass or hydronephrosis visualized. Bladder: Appears normal for degree of bladder distention. Other: Incidental splenule noted in the left upper quadrant. Limited imaging of the liver demonstrates increased parenchymal echotexture compatible with hepatic steatosis. IMPRESSION: 1. Unremarkable renal ultrasound. 2. Hepatic steatosis. Electronically Signed   By: Sharlet Salina M.D.   On: 04/04/2023 18:09   DG Chest Portable 1 View  Result Date: 04/04/2023 CLINICAL DATA:  Hypoxia. EXAM: PORTABLE CHEST 1 VIEW COMPARISON:  Mar 02, 2023. FINDINGS: The heart size and mediastinal contours are within normal limits. Both lungs are clear. The visualized skeletal structures are unremarkable. IMPRESSION: No active disease. Aortic Atherosclerosis (ICD10-I70.0). Electronically Signed   By: Lupita Raider M.D.   On: 04/04/2023 14:30    Pending Labs Unresulted Labs (From  admission, onward)     Start     Ordered   04/05/23 0500  CBC  Tomorrow morning,   R        04/04/23 1634   04/05/23 0500  Basic metabolic panel  Tomorrow morning,   R        04/04/23 1634   04/04/23 1801  Magnesium  Add-on,   AD        04/04/23 1800   04/04/23 1759  Blood gas, venous  Once,   R        04/04/23 1800            Vitals/Pain Today's Vitals   04/04/23 1327 04/04/23 1359 04/04/23 1615 04/04/23 1630  BP:   (!) 117/90 121/69  Pulse:   (!) 104 (!) 57  Resp:    18  Temp:  98.8 F (37.1 C)    TempSrc:  Rectal    SpO2:   98% 97%  PainSc: 0-No pain       Isolation Precautions No active isolations  Medications Medications  potassium chloride SA (KLOR-CON M) CR tablet 40 mEq (40 mEq Oral Given 04/04/23 1803)  calcium gluconate 1 g/ 50 mL sodium chloride IVPB (has no administration in time range)  cefTRIAXone (ROCEPHIN) 1 g in sodium chloride 0.9 % 100 mL IVPB (1 g Intravenous New Bag/Given 04/04/23 1758)  acetaminophen (TYLENOL) tablet 650 mg (has no administration in time range)  aspirin EC tablet 81 mg (has no administration in time range)  simvastatin (ZOCOR) tablet 40  mg (has no administration in time range)  donepezil (ARICEPT) tablet 10 mg (has no administration in time range)  memantine (NAMENDA) tablet 10 mg (has no administration in time range)  levothyroxine (SYNTHROID) tablet 50 mcg (has no administration in time range)  pantoprazole (PROTONIX) EC tablet 40 mg (has no administration in time range)  ondansetron (ZOFRAN) tablet 4 mg (has no administration in time range)  fluticasone (FLONASE) 50 MCG/ACT nasal spray 2 spray (has no administration in time range)  ipratropium-albuterol (DUONEB) 0.5-2.5 (3) MG/3ML nebulizer solution 3 mL (has no administration in time range)  loratadine (CLARITIN) tablet 10 mg (has no administration in time range)  umeclidinium bromide (INCRUSE ELLIPTA) 62.5 MCG/ACT 1 puff (has no administration in time range)  hydrALAZINE  (APRESOLINE) injection 2 mg (has no administration in time range)  heparin injection 5,000 Units (has no administration in time range)  lactated ringers bolus 1,000 mL (1,000 mLs Intravenous New Bag/Given 04/04/23 1631)    Mobility non-ambulatory     Focused Assessments Pulmonary Assessment Handoff:  Lung sounds:   O2 Device: Nasal Cannula O2 Flow Rate (L/min): 2 L/min    R Recommendations: See Admitting Provider Note  Report given to:   Additional Notes:

## 2023-04-04 NOTE — Telephone Encounter (Signed)
Rec'd form call son to clarify. Son states mom fell and so did his dad, and he past away. He is needing form fill out to get mom place in a memory care units once she is d/c from [facility]. Place form in MD purple folder.Marland KitchenRaechel Chute

## 2023-04-05 DIAGNOSIS — R531 Weakness: Secondary | ICD-10-CM | POA: Diagnosis not present

## 2023-04-05 DIAGNOSIS — Z66 Do not resuscitate: Secondary | ICD-10-CM

## 2023-04-05 DIAGNOSIS — N3001 Acute cystitis with hematuria: Secondary | ICD-10-CM

## 2023-04-05 DIAGNOSIS — F02B0 Dementia in other diseases classified elsewhere, moderate, without behavioral disturbance, psychotic disturbance, mood disturbance, and anxiety: Secondary | ICD-10-CM

## 2023-04-05 DIAGNOSIS — E1151 Type 2 diabetes mellitus with diabetic peripheral angiopathy without gangrene: Secondary | ICD-10-CM | POA: Diagnosis present

## 2023-04-05 DIAGNOSIS — N179 Acute kidney failure, unspecified: Secondary | ICD-10-CM | POA: Diagnosis present

## 2023-04-05 DIAGNOSIS — E876 Hypokalemia: Secondary | ICD-10-CM | POA: Diagnosis present

## 2023-04-05 DIAGNOSIS — E039 Hypothyroidism, unspecified: Secondary | ICD-10-CM | POA: Diagnosis present

## 2023-04-05 DIAGNOSIS — R2681 Unsteadiness on feet: Secondary | ICD-10-CM | POA: Diagnosis not present

## 2023-04-05 DIAGNOSIS — G301 Alzheimer's disease with late onset: Secondary | ICD-10-CM

## 2023-04-05 DIAGNOSIS — I700 Atherosclerosis of aorta: Secondary | ICD-10-CM | POA: Diagnosis not present

## 2023-04-05 DIAGNOSIS — F0280 Dementia in other diseases classified elsewhere without behavioral disturbance: Secondary | ICD-10-CM | POA: Diagnosis present

## 2023-04-05 DIAGNOSIS — R001 Bradycardia, unspecified: Secondary | ICD-10-CM | POA: Diagnosis present

## 2023-04-05 DIAGNOSIS — Z1152 Encounter for screening for COVID-19: Secondary | ICD-10-CM | POA: Diagnosis not present

## 2023-04-05 DIAGNOSIS — E1165 Type 2 diabetes mellitus with hyperglycemia: Secondary | ICD-10-CM | POA: Diagnosis not present

## 2023-04-05 DIAGNOSIS — G9340 Encephalopathy, unspecified: Secondary | ICD-10-CM | POA: Diagnosis not present

## 2023-04-05 DIAGNOSIS — G309 Alzheimer's disease, unspecified: Secondary | ICD-10-CM | POA: Diagnosis present

## 2023-04-05 DIAGNOSIS — E860 Dehydration: Secondary | ICD-10-CM

## 2023-04-05 DIAGNOSIS — K219 Gastro-esophageal reflux disease without esophagitis: Secondary | ICD-10-CM | POA: Diagnosis present

## 2023-04-05 DIAGNOSIS — E785 Hyperlipidemia, unspecified: Secondary | ICD-10-CM | POA: Diagnosis present

## 2023-04-05 DIAGNOSIS — R0902 Hypoxemia: Secondary | ICD-10-CM | POA: Diagnosis not present

## 2023-04-05 DIAGNOSIS — Z515 Encounter for palliative care: Secondary | ICD-10-CM

## 2023-04-05 DIAGNOSIS — J449 Chronic obstructive pulmonary disease, unspecified: Secondary | ICD-10-CM | POA: Diagnosis present

## 2023-04-05 DIAGNOSIS — R410 Disorientation, unspecified: Secondary | ICD-10-CM | POA: Diagnosis not present

## 2023-04-05 DIAGNOSIS — Z634 Disappearance and death of family member: Secondary | ICD-10-CM | POA: Diagnosis not present

## 2023-04-05 DIAGNOSIS — G9341 Metabolic encephalopathy: Secondary | ICD-10-CM | POA: Diagnosis present

## 2023-04-05 DIAGNOSIS — M6281 Muscle weakness (generalized): Secondary | ICD-10-CM | POA: Diagnosis not present

## 2023-04-05 DIAGNOSIS — Z7189 Other specified counseling: Secondary | ICD-10-CM

## 2023-04-05 DIAGNOSIS — Z789 Other specified health status: Secondary | ICD-10-CM

## 2023-04-05 DIAGNOSIS — Z79890 Hormone replacement therapy (postmenopausal): Secondary | ICD-10-CM | POA: Diagnosis not present

## 2023-04-05 DIAGNOSIS — Z86010 Personal history of colonic polyps: Secondary | ICD-10-CM | POA: Diagnosis not present

## 2023-04-05 DIAGNOSIS — N390 Urinary tract infection, site not specified: Secondary | ICD-10-CM | POA: Diagnosis present

## 2023-04-05 DIAGNOSIS — M8589 Other specified disorders of bone density and structure, multiple sites: Secondary | ICD-10-CM | POA: Diagnosis not present

## 2023-04-05 DIAGNOSIS — R319 Hematuria, unspecified: Secondary | ICD-10-CM | POA: Diagnosis present

## 2023-04-05 DIAGNOSIS — Z7401 Bed confinement status: Secondary | ICD-10-CM | POA: Diagnosis not present

## 2023-04-05 DIAGNOSIS — I10 Essential (primary) hypertension: Secondary | ICD-10-CM | POA: Diagnosis present

## 2023-04-05 DIAGNOSIS — Z885 Allergy status to narcotic agent status: Secondary | ICD-10-CM | POA: Diagnosis not present

## 2023-04-05 DIAGNOSIS — Z9181 History of falling: Secondary | ICD-10-CM | POA: Diagnosis not present

## 2023-04-05 DIAGNOSIS — R1312 Dysphagia, oropharyngeal phase: Secondary | ICD-10-CM | POA: Diagnosis not present

## 2023-04-05 DIAGNOSIS — Z888 Allergy status to other drugs, medicaments and biological substances status: Secondary | ICD-10-CM | POA: Diagnosis not present

## 2023-04-05 DIAGNOSIS — R4182 Altered mental status, unspecified: Secondary | ICD-10-CM | POA: Diagnosis not present

## 2023-04-05 DIAGNOSIS — J9610 Chronic respiratory failure, unspecified whether with hypoxia or hypercapnia: Secondary | ICD-10-CM | POA: Diagnosis not present

## 2023-04-05 DIAGNOSIS — B961 Klebsiella pneumoniae [K. pneumoniae] as the cause of diseases classified elsewhere: Secondary | ICD-10-CM | POA: Diagnosis not present

## 2023-04-05 DIAGNOSIS — R2689 Other abnormalities of gait and mobility: Secondary | ICD-10-CM | POA: Diagnosis not present

## 2023-04-05 DIAGNOSIS — Z794 Long term (current) use of insulin: Secondary | ICD-10-CM | POA: Diagnosis not present

## 2023-04-05 LAB — BASIC METABOLIC PANEL
Anion gap: 11 (ref 5–15)
BUN: 43 mg/dL — ABNORMAL HIGH (ref 8–23)
CO2: 27 mmol/L (ref 22–32)
Calcium: 7 mg/dL — ABNORMAL LOW (ref 8.9–10.3)
Chloride: 103 mmol/L (ref 98–111)
Creatinine, Ser: 1.36 mg/dL — ABNORMAL HIGH (ref 0.44–1.00)
GFR, Estimated: 38 mL/min — ABNORMAL LOW (ref >60)
Glucose, Bld: 152 mg/dL — ABNORMAL HIGH (ref 70–99)
Potassium: 3.7 mmol/L (ref 3.5–5.1)
Sodium: 141 mmol/L (ref 135–145)

## 2023-04-05 LAB — CBC
HCT: 45.6 % (ref 36.0–46.0)
Hemoglobin: 14.6 g/dL (ref 12.0–15.0)
MCH: 28.4 pg (ref 26.0–34.0)
MCHC: 32 g/dL (ref 30.0–36.0)
MCV: 88.7 fL (ref 80.0–100.0)
Platelets: 74 10*3/uL — ABNORMAL LOW (ref 150–400)
RBC: 5.14 MIL/uL — ABNORMAL HIGH (ref 3.87–5.11)
RDW: 12.8 % (ref 11.5–15.5)
WBC: 10.7 10*3/uL — ABNORMAL HIGH (ref 4.0–10.5)
nRBC: 0 % (ref 0.0–0.2)

## 2023-04-05 LAB — MAGNESIUM: Magnesium: 2.6 mg/dL — ABNORMAL HIGH (ref 1.7–2.4)

## 2023-04-05 MED ORDER — RENA-VITE PO TABS
1.0000 | ORAL_TABLET | Freq: Every day | ORAL | Status: DC
  Administered 2023-04-05 – 2023-04-07 (×3): 1 via ORAL
  Filled 2023-04-05 (×3): qty 1

## 2023-04-05 MED ORDER — PANTOPRAZOLE SODIUM 40 MG IV SOLR
40.0000 mg | INTRAVENOUS | Status: DC
  Administered 2023-04-05: 40 mg via INTRAVENOUS
  Filled 2023-04-05: qty 10

## 2023-04-05 MED ORDER — NICOTINE 14 MG/24HR TD PT24
14.0000 mg | MEDICATED_PATCH | Freq: Every day | TRANSDERMAL | Status: DC
  Administered 2023-04-05 – 2023-04-08 (×4): 14 mg via TRANSDERMAL
  Filled 2023-04-05 (×4): qty 1

## 2023-04-05 MED ORDER — ASPIRIN 81 MG PO CHEW
81.0000 mg | CHEWABLE_TABLET | Freq: Every day | ORAL | Status: DC
  Administered 2023-04-05 – 2023-04-08 (×4): 81 mg via ORAL
  Filled 2023-04-05 (×4): qty 1

## 2023-04-05 NOTE — Evaluation (Signed)
Clinical/Bedside Swallow Evaluation Patient Details  Name: [name] MRN: [id number] Date of Birth: 1936/03/29  Today's Date: 04/05/2023 Time: SLP Start Time (ACUTE ONLY): 1312 SLP Stop Time (ACUTE ONLY): 1334 SLP Time Calculation (min) (ACUTE ONLY): 22 min  Past Medical History:  Past Medical History:  Diagnosis Date   Anxiety    COPD (chronic obstructive pulmonary disease) (HCC)    Dementia (HCC)    Diverticulosis of colon (without mention of hemorrhage)    Gallstones    GERD (gastroesophageal reflux disease)    Hiatal hernia    Hyperlipidemia    Hypertension    Hypothyroidism    Low back pain    OA R radiculopathy   Lower extremity edema    chronic   Osteopenia    Peptic ulcer    Peripheral vascular disease (HCC)    Personal history of colonic polyps 2001   TUBULAR ADENOMA - [place]   Pneumonia    Past Surgical History:  Past Surgical History:  Procedure Laterality Date   ABDOMINAL ADHESION SURGERY     ABDOMINAL HYSTERECTOMY     APPENDECTOMY     CARPAL TUNNEL RELEASE Bilateral    CATARACT EXTRACTION W/ INTRAOCULAR LENS  IMPLANT, BILATERAL     CHOLECYSTECTOMY     NECK SURGERY     bone and plate    ROTATOR CUFF REPAIR Bilateral    TUBAL LIGATION     HPI:  Pt is an 87 y.o. female who presented from SNF for evaluation of acute confusion. Dx acute metabolic encephalopathy secondary to UTI and AKI. CT head negative for acute changes. CXR negative for active disease. PMH: advanced dementia, HTN HLD, COPD, hypothyroidism.    Assessment / Plan / Recommendation  Clinical Impression  Pt was seen for bedside swallow evaluation and she denied a history of dysphagia. Pt's reliability as a historian was questioned so [facility], SNF was contacted by this SLP. Staff there reported that the pt had been consuming a regular texture diet with thin liquids and had been able to tolerate meds whole with liquids up until 6/24 when they had to start crushing them. Staff denied  the pt having a history of oropharyngeal dysphagia or typically demonstrating any signs of aspiration. Oral mechanism exam was limited due to pt's difficulty following commands; however, oral motor strength and ROM appeared grossly WFL. Dentition was adequate with a combination of maxillary dentures and natural mandibular teeth. She exhibited three instances of coughing with thin liquids via straw over twelve trials; this appeared appeared to be related to rate and bolus size. No other symptoms of oropharyngeal dysphagia were noted. Pt's current diet of regular texture solids and thin liquids will be continued with observance of swallowing precautions. SLP will follow to ensure tolerance and to determine need for further assessment/intervention. SLP Visit Diagnosis: Dysphagia, unspecified (R13.10)    Aspiration Risk  Mild aspiration risk    Diet Recommendation Regular;Thin liquid    Liquid Administration via: Cup;Straw Medication Administration: Whole meds with puree (or crushed with puree) Supervision: Staff to assist with self feeding Compensations: Small sips/bites;Slow rate Postural Changes: Seated upright at 90 degrees    Other  Recommendations Oral Care Recommendations: Oral care BID    Recommendations for follow up therapy are one component of a multi-disciplinary discharge planning process, led by the attending physician.  Recommendations may be updated based on patient status, additional functional criteria and insurance authorization.  Follow up Recommendations  (TBD)      Assistance Recommended  at Discharge    Functional Status Assessment Patient has had a recent decline in their functional status and demonstrates the ability to make significant improvements in function in a reasonable and predictable amount of time.  Frequency and Duration min 2x/week  2 weeks       Prognosis Prognosis for improved oropharyngeal function: Good Barriers to Reach Goals: Cognitive deficits       Swallow Study   General Date of Onset: 04/04/23 HPI: Pt is an 87 y.o. female who presented from SNF for evaluation of acute confusion. Dx acute metabolic encephalopathy secondary to UTI and AKI. CT head negative for acute changes. CXR negative for active disease. PMH: advanced dementia, HTN HLD, COPD, hypothyroidism. Type of Study: Bedside Swallow Evaluation Previous Swallow Assessment: none Diet Prior to this Study: Regular;Thin liquids (Level 0) Temperature Spikes Noted: No Respiratory Status: Nasal cannula History of Recent Intubation: No Behavior/Cognition: Alert;Cooperative;Pleasant mood;Confused;Requires cueing;Doesn't follow directions Oral Cavity Assessment: Within Functional Limits Oral Care Completed by SLP: No Oral Cavity - Dentition: Adequate natural dentition;Dentures, top Vision: Functional for self-feeding Self-Feeding Abilities: Needs assist Patient Positioning: Upright in bed Baseline Vocal Quality: Normal Volitional Cough: Strong    Oral/Motor/Sensory Function Overall Oral Motor/Sensory Function: Within functional limits   Ice Chips Ice chips: Not tested   Thin Liquid Thin Liquid: Impaired Presentation: Straw Pharyngeal  Phase Impairments: Cough - Immediate    Nectar Thick Nectar Thick Liquid: Not tested   Honey Thick Honey Thick Liquid: Not tested   Puree Puree: Within functional limits Presentation: Spoon   Solid     Solid: Within functional limits Presentation: Self Fed     Zyair Russi [name], MS, CCC-SLP Neuro Diagnostic Specialist  Acute Rehabilitation Services Office number: [phone]  [name] 04/05/2023,1:36 PM

## 2023-04-05 NOTE — Progress Notes (Signed)
[facility]                                                                               [name], is a 87 y.o. female, DOB - 03-03-1936, ZOX:[id number] Admit date - 04/04/2023    Outpatient Primary MD for the patient is Plotnikov, Georgina Quint, MD  LOS - 0  days    Brief summary  [name] is a 87 y.o. female with medical history significant of advanced dementia, HTN HLD, COPD, hypothyroidism, sent from nursing home for evaluation of acute confusion.    Assessment & Plan    Assessment and Plan:   Acute metabolic encephalopathy  Secondary to UTI and AKI.  Admitted with a creatinine of 1.9, improved to 1. With IV fluids.  Recommend to continue with IV fluids and IV rocephin.  Holding off lasix and losartan.  Recheck renal parameters in am.    Severe hypokalemia:  Replaced. Recheck in am.   Hypertension:  Well controlled.    Dementia:  Continue with Aricept and memantine.   Hyperlipidemia Continue with zocor.    Hypothyroidism; Resume synthroid.      COPD Stable. Continue with duonebs    RN Pressure Injury Documentation:    Malnutrition Type:  Nutrition Problem: Inadequate oral intake Etiology: lethargy/confusion   Malnutrition Characteristics:  Signs/Symptoms: per patient/family report   Nutrition Interventions:  Interventions: Ensure Enlive (each supplement provides 350kcal and 20 grams of protein), MVI  Estimated body mass index is 24.27 kg/m as calculated from the following:   Height as of this encounter: 5' 2.01" (1.575 m).   Weight as of this encounter: 60.2 kg.  Code Status: full code.  DVT Prophylaxis:  heparin injection 5,000 Units Start: 04/04/23 2200   Level of Care: Level of care: Med-Surg Family Communication: Updated patient's son at bedside.   Disposition Plan:     Remains inpatient appropriate:  IV rocephin and IV fluids.   Procedures:  None.  Consultants:   None.    Antimicrobials:   Anti-infectives (From admission, onward)    Start     Dose/Rate Route Frequency Ordered Stop   04/04/23 1730  cefTRIAXone (ROCEPHIN) 1 g in sodium chloride 0.9 % 100 mL IVPB        1 g 200 mL/hr over 30 Minutes Intravenous Every 24 hours 04/04/23 1722          Medications  Scheduled Meds:  aspirin  81 mg Oral Daily   donepezil  10 mg Oral QHS   feeding supplement  237 mL Oral BID BM   fluticasone  2 spray Each Nare Daily   heparin  5,000 Units Subcutaneous Q12H   levothyroxine  50 mcg Oral QAC breakfast   loratadine  10 mg Oral Daily   memantine  10 mg Oral BID   multivitamin  1 tablet Oral QHS   pantoprazole (PROTONIX) IV  40 mg Intravenous Q24H   simvastatin  40 mg Oral q1800   umeclidinium bromide  1 puff Inhalation Daily   Continuous Infusions:  sodium chloride 100 mL/hr at 04/04/23 2142   cefTRIAXone (ROCEPHIN)  IV Stopped (04/04/23 1851)   PRN Meds:.acetaminophen, fentaNYL (SUBLIMAZE) injection,  hydrALAZINE, ipratropium-albuterol, ondansetron    Subjective:   [name] was seen and examined today.  No events overnight.   Objective:   Vitals:   04/04/23 2014 04/05/23 0548 04/05/23 0758 04/05/23 0821  BP: (!) 112/43 133/89 (!) 108/57   Pulse: 78 80 74   Resp: 16 15 18    Temp: 98.7 F (37.1 C) 97.6 F (36.4 C) 97.8 F (36.6 C)   TempSrc:      SpO2: 97% 100% 100% 98%  Weight:      Height:        Intake/Output Summary (Last 24 hours) at 04/05/2023 1200 Last data filed at 04/05/2023 1100 Gross per 24 hour  Intake 1140 ml  Output --  Net 1140 ml   Filed Weights   04/04/23 2012  Weight: 60.2 kg     Exam General exam: Appears calm and comfortable  Respiratory system: Clear to auscultation. Respiratory effort normal. Cardiovascular system: S1 & S2 heard, RRR.  Gastrointestinal system: Abdomen is nondistended, soft and nontender.  Central nervous system: lethargic, able to recognize her son.  Extremities: no edema.   Skin: No rashes, Psychiatry: no agitation.    Data Reviewed:  I have personally reviewed following labs and imaging studies   CBC Lab Results  Component Value Date   WBC 10.7 (H) 04/05/2023   RBC 5.14 (H) 04/05/2023   HGB 14.6 04/05/2023   HCT 45.6 04/05/2023   MCV 88.7 04/05/2023   MCH 28.4 04/05/2023   PLT 74 (L) 04/05/2023   MCHC 32.0 04/05/2023   RDW 12.8 04/05/2023   LYMPHSABS 4.1 (H) 04/04/2023   MONOABS 0.9 04/04/2023   EOSABS 0.1 04/04/2023   BASOSABS 0.1 04/04/2023     Last metabolic panel Lab Results  Component Value Date   NA 141 04/05/2023   K 3.7 04/05/2023   CL 103 04/05/2023   CO2 27 04/05/2023   BUN 43 (H) 04/05/2023   CREATININE 1.36 (H) 04/05/2023   GLUCOSE 152 (H) 04/05/2023   GFRNONAA 38 (L) 04/05/2023   GFRAA >90 02/22/2014   CALCIUM 7.0 (L) 04/05/2023   PROT 5.1 (L) 04/04/2023   ALBUMIN 2.7 (L) 04/04/2023   BILITOT 1.8 (H) 04/04/2023   ALKPHOS 62 04/04/2023   AST 37 04/04/2023   ALT 45 (H) 04/04/2023   ANIONGAP 11 04/05/2023    CBG (last 3)  Recent Labs    04/04/23 1334 04/04/23 2330  GLUCAP 137* 155*      Coagulation Profile: No results for input(s): "INR", "PROTIME" in the last 168 hours.   Radiology Studies: CT HEAD WO CONTRAST ( )  Result Date: 04/04/2023 CLINICAL DATA:  Provided history: Delirium. EXAM: CT HEAD WITHOUT CONTRAST TECHNIQUE: Contiguous axial images were obtained from the base of the skull through the vertex without intravenous contrast. RADIATION DOSE REDUCTION: This exam was performed according to the departmental dose-optimization program which includes automated exposure control, adjustment of the mA and/or kV according to patient size and/or use of iterative reconstruction technique. COMPARISON:  Head CT 03/02/2023 and earlier. FINDINGS: Brain: Moderate-to-advanced generalized cerebral atrophy. Mild cerebellar atrophy. Patchy and ill-defined hypoattenuation within the cerebral white matter, nonspecific  but compatible with moderate-to-advanced chronic small vessel ischemic disease. There is no acute intracranial hemorrhage. No demarcated cortical infarct. No extra-axial fluid collection. No evidence of an intracranial mass. No midline shift. Vascular: No hyperdense vessel.  Atherosclerotic calcifications. Skull: No fracture or aggressive osseous lesion. Sinuses/Orbits: No mass or acute finding within the imaged orbits. No significant paranasal sinus disease at  the imaged levels. IMPRESSION: 1.  No evidence of an acute intracranial abnormality. 2. Parenchymal atrophy and chronic small vessel ischemic disease, as described. Electronically Signed   By: Jackey Loge D.O.   On: 04/04/2023 18:25   US RENAL  Result Date: 04/04/2023 CLINICAL DATA:  Acute renal insufficiency, hypertension EXAM: RENAL / URINARY TRACT ULTRASOUND COMPLETE COMPARISON:  None Available. FINDINGS: Right Kidney: Renal measurements: 8.3 x 4.3 x 4.7 cm = volume: 87.1 mL. Echogenicity within normal limits. No mass or hydronephrosis visualized. Left Kidney: Renal measurements: 8.2 x 4.7 x 4.2 cm = volume: 83.7 mL. Echogenicity within normal limits. No mass or hydronephrosis visualized. Bladder: Appears normal for degree of bladder distention. Other: Incidental splenule noted in the left upper quadrant. Limited imaging of the liver demonstrates increased parenchymal echotexture compatible with hepatic steatosis. IMPRESSION: 1. Unremarkable renal ultrasound. 2. Hepatic steatosis. Electronically Signed   By: Sharlet Salina M.D.   On: 04/04/2023 18:09   DG Chest Portable 1 View  Result Date: 04/04/2023 CLINICAL DATA:  Hypoxia. EXAM: PORTABLE CHEST 1 VIEW COMPARISON:  Mar 02, 2023. FINDINGS: The heart size and mediastinal contours are within normal limits. Both lungs are clear. The visualized skeletal structures are unremarkable. IMPRESSION: No active disease. Aortic Atherosclerosis (ICD10-I70.0). Electronically Signed   By: Lupita Raider M.D.    On: 04/04/2023 14:30       Kathlen Mody M.D. [facility] 04/05/2023, 12:00 PM  Available via Epic secure chat 7am-7pm After 7 pm, please refer to night coverage provider listed on amion.

## 2023-04-05 NOTE — Progress Notes (Signed)
Initial Nutrition Assessment  DOCUMENTATION CODES:   Not applicable  INTERVENTION:  - Continue Ensure Enlive po BID, each supplement provides 350 kcal and 20 grams of protein.  - Add Renal MVI.   NUTRITION DIAGNOSIS:   Inadequate oral intake related to lethargy/confusion as evidenced by per patient/family report.  GOAL:   Patient will meet greater than or equal to 90% of their needs  MONITOR:   PO intake, Supplement acceptance  REASON FOR ASSESSMENT:   Malnutrition Screening Tool    ASSESSMENT:   87 y.o. female admits related to confusion. PMH includes: advanced dementia, HTN, HLD, COPD, hypothyroidism. Pt is currently receiving medical management related to AMS.  Meds reviewed. Labs reviewed: BUN/Creatinine elevated, magnesium elevated. IVF: NS @ 100 mL/hr.   The pt is currently oriented x1. Pt is on a heart healthy carb modified diet. No intakes documented at this time. RD will liberalize to a regular diet. The pt also has Ensure Enlive BID ordered. RD will continue to monitor PO intakes. No significant wt loss per record.   NUTRITION - FOCUSED PHYSICAL EXAM:  Remote assessment.  Diet Order:   Diet Order             Diet heart healthy/carb modified Room service appropriate? Yes; Fluid consistency: Thin  Diet effective now                   EDUCATION NEEDS:   Not appropriate for education at this time  Skin:  Skin Assessment: Reviewed RN Assessment  Last BM:  PTA  Height:   Ht Readings from Last 1 Encounters:  04/04/23 5' 2.01" (1.575 m)    Weight:   Wt Readings from Last 1 Encounters:  04/04/23 60.2 kg    Ideal Body Weight:     BMI:  Body mass index is 24.27 kg/m.  Estimated Nutritional Needs:   Kcal:  1500-1800 kcals  Protein:  75-90 gm  Fluid:  >/= 1.5 L  Bethann Humble, RD, LDN, CNSC.

## 2023-04-05 NOTE — Consult Note (Addendum)
Consultation Note Date: 04/05/2023   Patient Name: [name]  DOB: [birth year]  MRN: [id number]  Age / Sex: 87 y.o., female  PCP: Plotnikov, Georgina Quint, MD Referring Physician: Kathlen Mody, MD  Reason for Consultation: Establishing goals of care  HPI/Patient Profile: 87 y.o. female  with past medical history of advanced dementia, HTN HLD, COPD, hypothyroidism presented to ED on 04/04/23 from [facility] with AMS and reported abnormal labs. Patient was admitted on 04/04/2023 with acute metabolic encephalopathy, AKI, severe hypokalemia.   Of note, patient was recently hospitalized 5/24-5/29/24 after a fall.   Clinical Assessment and Goals of Care: I have reviewed medical records including EPIC notes, labs, and imaging. Received report from primary RN - no acute concerns. RN reports patient ate about 25% of lunch.    Went to visit patient at bedside - no family/visitors present. Patient was lying in bed awake, alert, oriented to self only, and able to participate in simple conversation. She is not able to make complex medical decisions. No signs or non-verbal gestures of pain or discomfort noted. No respiratory distress, increased work of breathing, or secretions noted. She is on 2L O2 [place]. She is eating graham crackers.  2:11 PM Attempted to call son/[name] to discuss diagnosis, prognosis, GOC, EOL wishes, disposition, and options - no answer - confidential voicemail left and PMT phone number provided with request to return call.  2:13 PM Attempted to call daughter/Karen to discuss diagnosis, prognosis, GOC, EOL wishes, disposition, and options - no answer - confidential voicemail left and PMT phone number provided with request to return call.  2:21 PM Notified [name] returned PMT call.   Met with [name] and his wife/Karen (patient's daughter in law) via phone  to discuss diagnosis, prognosis, GOC, EOL wishes,  disposition, and options.  I introduced Palliative Medicine as specialized medical care for people living with serious illness. It focuses on providing relief from the symptoms and stress of a serious illness. The goal is to improve quality of life for both the patient and the family.  We discussed a brief life review of the patient as well as functional and nutritional status. Patient's husband of 61 years recently passed away in early 2023/04/10. He was her primary caregiver. After patient's most recent hospitalization, she was discharged to [facility] rehab - [name] does feel she was finding benefit working with PT/OT therapies. He described her as "upbeat" and "positive." She was able to ambulate with a walker about 30-50 feet before feeling short of breath (due to her COPD) at which time she would use a wheelchair. [name] tells me her appetite was "really well" prior to her IBS flair which caused diarrhea and subsequently likely her dehydration and UTI.  Albumin noted at 2.7 on 04/04/23.  We discussed patient's current illness and what it means in the larger context of patient's on-going co-morbidities. [name] understands that COPD and dementia are progressive, non-curable diseases underlying the patient's current acute medical conditions. [name] was on the Board of Directors for the Alzheimer's [employer] and is very familiar with anticipated course of this disease. Natural disease trajectory and expectations at EOL were discussed. I attempted to elicit values and goals of care important to the patient. The difference between aggressive medical intervention and comfort care was considered in light of the patient's goals of care.   Hospice and Palliative Care services outpatient were explained and offered.  [name] would like patient to be medically optimized while admitted. Goal is for her to return  to rehab with outpatient Palliative Care to follow. Long term, he would like her transitioned to a memory care facility  in [place] closer to him - he has already located the facility and is working on this. In the event of a future decline, they would prefer to focus on patient's comfort and quality of life with hospice rather than rehospitalization.   Encouraged family to consider DNR/DNI status understanding evidenced based poor outcomes in similar hospitalized patient, as the cause of arrest is likely associated with advanced chronic/terminal illness rather than an easily reversible acute cardio-pulmonary event.  I shared that even if we pursued resuscitation we would not able to resolve the underlying factors. I explained that DNR/DNI does not change the medical plan and it only comes into effect after a person has arrested (died).  It is a protective measure to keep [place] from harming the patient in their last moments of life. [name] was agreeable to DNR/DNI with understanding that patient would not receive CPR, defibrillation, ACLS medications, or intubation.   Advance directives, concepts specific to code status, artificial feeding and hydration, and rehospitalization were considered and discussed. Scheduled meeting for tomorrow 6/28 at 1p to complete MOST form. [name] emailed a copy of HCPOA documents - printed and will get scanned into EHR.  Visit also consisted of discussions dealing with the complex and emotionally intense issues of symptom management and palliative care in the setting of serious and potentially life-threatening illness.   Discussed with patient/family the importance of continued conversation with each other and the medical providers regarding overall plan of care and treatment options, ensuring decisions are within the context of the patient's values and GOCs.    Questions and concerns were addressed. The patient/family was encouraged to call with questions and/or concerns. PMT card was provided.   Primary Decision Maker: HCPOA/son - [name] Vittorio     SUMMARY OF RECOMMENDATIONS   Continue  to gently treat the treatable Now DNR/DNI Goals are for discharge back to [facility] rehab with outpatient Palliative Care to follow. Long term goal is for patient to be moved into memory care facility in [place] close to family. In the event of a future decline, family are interested in comfort/hospice care TOC notified and consulted for: outpatient Palliative Care referral MOST form to be completed with HCPOA/son tomorrow 6/28 at 1p HCPOA document obtained - copy was made and will be scanned into Vynca/ACP tab PMT will continue to follow and support holistically   Code Status/Advance Care Planning: DNR  Palliative Prophylaxis:  Aspiration, Bowel Regimen, Frequent Pain Assessment, Oral Care, and Turn Reposition  Additional Recommendations (Limitations, Scope, Preferences): Full Scope Treatment, No Artificial Feeding, and No Tracheostomy  Psycho-social/Spiritual:  Desire for further Chaplaincy support:no Created space and opportunity for patient and family to express thoughts and feelings regarding patient's current medical situation.  Emotional support and therapeutic listening provided.  Prognosis:  Unable to determine  Discharge Planning: Skilled Nursing Facility for rehab with Palliative care service follow-up      Primary Diagnoses: Present on Admission:  Alzheimer's dementia [facility])  Urinary tract infection with hematuria  AMS (altered mental status)  AKI (acute kidney injury) (HCC)   I have reviewed the medical record, interviewed the patient and family, and examined the patient. The following aspects are pertinent.  Past Medical History:  Diagnosis Date   Anxiety    COPD (chronic obstructive pulmonary disease) (HCC)    Dementia (HCC)    Diverticulosis of colon (without mention of hemorrhage)  Gallstones    GERD (gastroesophageal reflux disease)    Hiatal hernia    Hyperlipidemia    Hypertension    Hypothyroidism    Low back pain    OA R radiculopathy    Lower extremity edema    chronic   Osteopenia    Peptic ulcer    Peripheral vascular disease (HCC)    Personal history of colonic polyps 2001   TUBULAR ADENOMA - [place]   Pneumonia    Social History   Socioeconomic History   Marital status: Widowed    Spouse name: Not on file   Number of children: 4   Years of education: Not on file   Highest education level: Not on file  Occupational History   Occupation: retired  Tobacco Use   Smoking status: Every Day    Packs/day: 1.50    Years: 61.00    Additional pack years: 0.00    Total pack years: 91.50    Types: Cigarettes   Smokeless tobacco: Never   Tobacco comments:    Sheet given on smoking  Vaping Use   Vaping Use: Never used  Substance and Sexual Activity   Alcohol use: No    Alcohol/week: 0.0 standard drinks of alcohol   Drug use: No   Sexual activity: Never    Partners: Male  Other Topics Concern   Not on file  Social History Narrative   Not on file   Social Determinants of Health   Financial Resource Strain: Low Risk  (06/06/2022)   Overall Financial Resource Strain (CARDIA)    Difficulty of Paying Living Expenses: Not hard at all  Food Insecurity: Patient Unable To Answer (04/04/2023)   Hunger Vital Sign    Worried About Running Out of Food in the Last Year: Patient unable to answer    Ran Out of Food in the Last Year: Patient unable to answer  Transportation Needs: Patient Unable To Answer (04/04/2023)   PRAPARE - Transportation    Lack of Transportation (Medical): Patient unable to answer    Lack of Transportation (Non-Medical): Patient unable to answer  Physical Activity: Inactive (06/06/2022)   Exercise Vital Sign    Days of Exercise per Week: 0 days    Minutes of Exercise per Session: 0 min  Stress: No Stress Concern Present (06/06/2022)   [employer] of Occupational Health - Occupational Stress Questionnaire    Feeling of Stress : Not at all  Social Connections: Socially Integrated (06/06/2022)    Social Connection and Isolation Panel [NHANES]    Frequency of Communication with Friends and Family: More than three times a week    Frequency of Social Gatherings with Friends and Family: More than three times a week    Attends Religious Services: More than 4 times per year    Active Member of [employer] or Organizations: Yes    Attends Engineer, structural: More than 4 times per year    Marital Status: Married   Family History  Problem Relation Age of Onset   Dementia Mother    Hypertension Father    Heart disease Father    Colon cancer Sister    Heart disease Sister    Esophageal cancer Neg Hx    Rectal cancer Neg Hx    Stomach cancer Neg Hx    Scheduled Meds:  aspirin  81 mg Oral Daily   donepezil  10 mg Oral QHS   feeding supplement  237 mL Oral BID BM   fluticasone  2 spray Each Nare Daily   levothyroxine  50 mcg Oral QAC breakfast   loratadine  10 mg Oral Daily   memantine  10 mg Oral BID   multivitamin  1 tablet Oral QHS   nicotine  14 mg Transdermal Daily   pantoprazole (PROTONIX) IV  40 mg Intravenous Q24H   simvastatin  40 mg Oral q1800   umeclidinium bromide  1 puff Inhalation Daily   Continuous Infusions:  sodium chloride 100 mL/hr at 04/04/23 2142   cefTRIAXone (ROCEPHIN)  IV Stopped (04/04/23 1851)   PRN Meds:.acetaminophen, fentaNYL (SUBLIMAZE) injection, hydrALAZINE, ipratropium-albuterol, ondansetron Medications Prior to Admission:  Prior to Admission medications   Medication Sig Start Date End Date Taking? Authorizing Provider  acetaminophen (TYLENOL) 325 MG tablet Take 650 mg by mouth every 6 (six) hours as needed for moderate pain.   Yes [provider]  aspirin 81 MG EC tablet Take 81 mg by mouth daily.   Yes [provider]  bisacodyl (DULCOLAX) 10 MG suppository Place 10 mg rectally daily as needed for moderate constipation. If not relieved bu MOM   Yes [provider]  Cholecalciferol (VITAMIN D3) 2000 units capsule  Take 1 capsule (2,000 Units total) daily by mouth. 08/15/17  Yes Plotnikov, Georgina Quint, MD  Diclofenac Sodium 1.5 % SOLN Place 5 drops onto the skin 3 (three) times daily as needed (pain). For pain applies on joints   Yes [provider]  donepezil (ARICEPT) 10 MG tablet Take 1 tablet (10 mg total) by mouth at bedtime. Annual appt due in Oct must see provider for future refills 05/01/22  Yes Everlena Cooper, Adam R, DO  fluticasone (FLONASE) 50 MCG/ACT nasal spray Place 2 sprays into both nostrils daily. 03/08/23  Yes Sheikh, Omair Latif, DO  furosemide (LASIX) 40 MG tablet Take 40 mg by mouth daily.   Yes [provider]  insulin aspart (NOVOLOG) 100 UNIT/ML injection Inject 0-9 Units into the skin in the morning, at noon, in the evening, and at bedtime. Sliding scale;Less than 70 - implement hypoglycemia protocol. 70-120 = 0 units 121-150 = 1 unit 151-200 = 2 units 201-250 = 3 units 251- 300 = 5 units 301-350 = 7 units 351-400 = 9 units Greater than 400 give 9 units and notify MD.   Yes [provider]  ipratropium-albuterol (DUONEB) 0.5-2.5 (3) MG/3ML SOLN Take 3 mLs by nebulization every 6 (six) hours as needed. Patient taking differently: Take 3 mLs by nebulization every 6 (six) hours as needed (wheezing). 03/07/23  Yes Sheikh, Omair Latif, DO  loratadine (CLARITIN) 10 MG tablet Take 1 tablet (10 mg total) by mouth daily. 03/08/23  Yes Sheikh, Omair Latif, DO  losartan (COZAAR) 50 MG tablet Take 1 tablet (50 mg total) by mouth daily. 06/20/22  Yes Plotnikov, Georgina Quint, MD  magnesium hydroxide (MILK OF MAGNESIA) 400 MG/5ML suspension Take 30 mLs by mouth daily as needed for mild constipation (constipation). If no BM in 3 days   Yes [provider]  memantine (NAMENDA) 10 MG tablet Take 1 tablet (10 mg total) by mouth 2 (two) times daily. Follow-up appt due in August must see provider for future refills 05/01/22  Yes Everlena Cooper, Adam R, DO  metFORMIN (GLUCOPHAGE) 500 MG tablet  Take 500 mg by mouth 2 (two) times daily with a meal.   Yes [provider]  Multiple Vitamin (MULTIVITAMIN) tablet Take 1 tablet by mouth daily.   Yes [provider]  omeprazole (PRILOSEC) 40 MG capsule TAKE  1 CAPSULE DAILY Patient taking differently: Take 40 mg by mouth daily. 07/27/22  Yes Plotnikov, Georgina Quint, MD  ondansetron (ZOFRAN) 4 MG tablet Take 1 tablet (4 mg total) by mouth every 6 (six) hours as needed. Take  1 tab every 6 hours as needed for nausea and vomiting. Patient taking differently: Take 4 mg by mouth every 6 (six) hours as needed for vomiting or nausea. 11/03/20  Yes Plotnikov, Georgina Quint, MD  simvastatin (ZOCOR) 40 MG tablet Take 1 tablet (40 mg total) by mouth daily at 6 PM. 06/20/22  Yes Plotnikov, Georgina Quint, MD  Sodium Phosphates (ENEMA) 7-19 GM/118ML ENEM Place 1 enema rectally once as needed (constipation). If not relieved by bisacodyl suppository   Yes [provider]  SYNTHROID 50 MCG tablet Take 1 tablet (50 mcg total) by mouth daily before breakfast. 07/14/22  Yes Plotnikov, Georgina Quint, MD  umeclidinium bromide (INCRUSE ELLIPTA) 62.5 MCG/ACT AEPB Inhale 1 puff into the lungs daily. 06/29/22  Yes Plotnikov, Georgina Quint, MD  doxycycline (VIBRA-TABS) 100 MG tablet Take 100 mg by mouth 2 (two) times daily.    [provider]  furosemide (LASIX) 20 MG tablet Take 2 tablets (40 mg total) by mouth daily. Patient not taking: Reported on 04/04/2023 07/24/22   Plotnikov, Georgina Quint, MD  predniSONE (STERAPRED UNI-PAK 21 TAB) 10 MG (21) TBPK tablet Take 6 tablets on day 1, 5 tablets on day 2, 4 tablets on day 3, 3 tablets on day 4, 2 tablets on day 5, 1 tablet on day 6 and then Stop on Day 7 Patient not taking: Reported on 04/04/2023 03/07/23   Marguerita Merles Latif, DO   Allergies  Allergen Reactions   Codeine Sulfate Other (See Comments)    Extreme headaches   Exelon [Rivastigmine Tartrate]     nausea   Oxycodone-Acetaminophen Other (See Comments)     Extreme headaches   Review of Systems  Unable to perform ROS: Dementia    Physical Exam Vitals and nursing note reviewed.  Constitutional:      General: She is not in acute distress. Pulmonary:     Effort: No respiratory distress.  Skin:    General: Skin is warm and dry.  Neurological:     Mental Status: She is alert. Mental status is at baseline. She is disoriented and confused.     Motor: Weakness present.  Psychiatric:        Attention and Perception: Attention normal.        Behavior: Behavior is cooperative.        Cognition and Memory: Cognition is impaired. Memory is impaired.        Judgment: Judgment is impulsive.     Vital Signs: BP (!) 108/57 (BP Location: Left Arm)   Pulse 74   Temp 97.8 F (36.6 C)   Resp 18   Ht 5' 2.01" (1.575 m)   Wt 60.2 kg   SpO2 98%   BMI 24.27 kg/m  Pain Scale: PAINAD   Pain Score: 0-No pain   SpO2: SpO2: 98 % O2 Device:SpO2: 98 % O2 Flow Rate: .O2 Flow Rate (L/min): 2 L/min  IO: Intake/output summary:  Intake/Output Summary (Last 24 hours) at 04/05/2023 1350 Last data filed at 04/05/2023 1100 Gross per 24 hour  Intake 1140 ml  Output --  Net 1140 ml    LBM:   Baseline Weight: Weight: 60.2 kg Most recent weight: Weight: 60.2 kg     Palliative Assessment/Data: PPS 30%  Time In: 1345 Time Out: 1515 Time Total: 90 minutes  Greater than 50%  of this time was spent counseling and coordinating care related to the above assessment and plan.  Signed by: Haskel Khan, NP   Please contact Palliative Medicine Team phone at [phone] for questions and concerns.  For individual provider: See Amion  *Portions of this note are a verbal dictation therefore any spelling and/or grammatical errors are due to the "Dragon Medical One" system interpretation.

## 2023-04-06 ENCOUNTER — Inpatient Hospital Stay (HOSPITAL_COMMUNITY): Payer: Medicare Other

## 2023-04-06 DIAGNOSIS — N3001 Acute cystitis with hematuria: Secondary | ICD-10-CM | POA: Diagnosis not present

## 2023-04-06 DIAGNOSIS — G301 Alzheimer's disease with late onset: Secondary | ICD-10-CM | POA: Diagnosis not present

## 2023-04-06 DIAGNOSIS — N179 Acute kidney failure, unspecified: Secondary | ICD-10-CM | POA: Diagnosis not present

## 2023-04-06 DIAGNOSIS — R410 Disorientation, unspecified: Secondary | ICD-10-CM | POA: Diagnosis not present

## 2023-04-06 LAB — CBC WITH DIFFERENTIAL/PLATELET
Abs Immature Granulocytes: 0.08 10*3/uL — ABNORMAL HIGH (ref 0.00–0.07)
Basophils Absolute: 0.1 10*3/uL (ref 0.0–0.1)
Basophils Relative: 1 %
Eosinophils Absolute: 0.4 10*3/uL (ref 0.0–0.5)
Eosinophils Relative: 4 %
HCT: 43.1 % (ref 36.0–46.0)
Hemoglobin: 14 g/dL (ref 12.0–15.0)
Immature Granulocytes: 1 %
Lymphocytes Relative: 35 %
Lymphs Abs: 3.4 10*3/uL (ref 0.7–4.0)
MCH: 29.2 pg (ref 26.0–34.0)
MCHC: 32.5 g/dL (ref 30.0–36.0)
MCV: 89.8 fL (ref 80.0–100.0)
Monocytes Absolute: 0.7 10*3/uL (ref 0.1–1.0)
Monocytes Relative: 7 %
Neutro Abs: 5.2 10*3/uL (ref 1.7–7.7)
Neutrophils Relative %: 52 %
Platelets: 81 10*3/uL — ABNORMAL LOW (ref 150–400)
RBC: 4.8 MIL/uL (ref 3.87–5.11)
RDW: 12.7 % (ref 11.5–15.5)
WBC: 9.8 10*3/uL (ref 4.0–10.5)
nRBC: 0 % (ref 0.0–0.2)

## 2023-04-06 LAB — BASIC METABOLIC PANEL
Anion gap: 8 (ref 5–15)
BUN: 26 mg/dL — ABNORMAL HIGH (ref 8–23)
CO2: 28 mmol/L (ref 22–32)
Calcium: 6.9 mg/dL — ABNORMAL LOW (ref 8.9–10.3)
Chloride: 106 mmol/L (ref 98–111)
Creatinine, Ser: 1.08 mg/dL — ABNORMAL HIGH (ref 0.44–1.00)
GFR, Estimated: 50 mL/min — ABNORMAL LOW (ref >60)
Glucose, Bld: 153 mg/dL — ABNORMAL HIGH (ref 70–99)
Potassium: 3.8 mmol/L (ref 3.5–5.1)
Sodium: 142 mmol/L (ref 135–145)

## 2023-04-06 LAB — MAGNESIUM: Magnesium: 1.9 mg/dL (ref 1.7–2.4)

## 2023-04-06 LAB — HEMOGLOBIN A1C
Hgb A1c MFr Bld: 7.1 % — ABNORMAL HIGH (ref 4.8–5.6)
Mean Plasma Glucose: 157 mg/dL

## 2023-04-06 MED ORDER — PANTOPRAZOLE SODIUM 40 MG PO TBEC
40.0000 mg | DELAYED_RELEASE_TABLET | Freq: Every day | ORAL | Status: DC
  Administered 2023-04-06 – 2023-04-08 (×3): 40 mg via ORAL
  Filled 2023-04-06 (×3): qty 1

## 2023-04-06 NOTE — Progress Notes (Signed)
[facility]                                                                               [name], is a 87 y.o. female, DOB - December 22, 1935, ION:[id number] Admit date - 04/04/2023    Outpatient Primary MD for the patient is Plotnikov, Georgina Quint, MD  LOS - 1  days    Brief summary  [name] is a 87 y.o. female with medical history significant of advanced dementia, HTN HLD, COPD, hypothyroidism, sent from nursing home for evaluation of acute confusion.    Assessment & Plan    Assessment and Plan:   Acute metabolic encephalopathy  Secondary to UTI and AKI.  Admitted with a creatinine of 1.9, improved to 1. 36 to 1.09 With IV fluids.  Recommend to continue with IV fluids and IV rocephin for another 24 hours. She is more alert and awake and answering simple questions. Unfortunately urine cultures were not done on admission. Recommend to complete three days of IV rocephin and possible d/c in am.  Holding off lasix and losartan.      Severe hypokalemia:  Replaced. Recheck in am.   Hypertension:  Well controlled.    Dementia:  Continue with Aricept and memantine.   Hyperlipidemia Continue with zocor.    Hypothyroidism; Resume synthroid.      COPD Stable. Continue with duonebs  Bradycardia:  ? From aricept.  Get EKG.     RN Pressure Injury Documentation:    Malnutrition Type:  Nutrition Problem: Inadequate oral intake Etiology: lethargy/confusion   Malnutrition Characteristics:  Signs/Symptoms: per patient/family report   Nutrition Interventions:  Interventions: Ensure Enlive (each supplement provides 350kcal and 20 grams of protein), MVI  Estimated body mass index is 24.27 kg/m as calculated from the following:   Height as of this encounter: 5' 2.01" (1.575 m).   Weight as of this encounter: 60.2 kg.  Code Status: full code.  DVT Prophylaxis:  Place and maintain sequential compression device Start:  04/05/23 1322   Level of Care: Level of care: Med-Surg Family Communication: Updated patient's son at bedside.   Disposition Plan:     Remains inpatient appropriate:  IV rocephin and IV fluids.   Procedures:  None.  Consultants:   None.   Antimicrobials:   Anti-infectives (From admission, onward)    Start     Dose/Rate Route Frequency Ordered Stop   04/04/23 1730  cefTRIAXone (ROCEPHIN) 1 g in sodium chloride 0.9 % 100 mL IVPB        1 g 200 mL/hr over 30 Minutes Intravenous Every 24 hours 04/04/23 1722          Medications  Scheduled Meds:  aspirin  81 mg Oral Daily   donepezil  10 mg Oral QHS   feeding supplement  237 mL Oral BID BM   fluticasone  2 spray Each Nare Daily   levothyroxine  50 mcg Oral QAC breakfast   loratadine  10 mg Oral Daily   memantine  10 mg Oral BID   multivitamin  1 tablet Oral QHS   nicotine  14 mg Transdermal Daily   pantoprazole (PROTONIX) IV  40 mg Intravenous  Q24H   simvastatin  40 mg Oral q1800   umeclidinium bromide  1 puff Inhalation Daily   Continuous Infusions:  cefTRIAXone (ROCEPHIN)  IV Stopped (04/05/23 1856)   PRN Meds:.acetaminophen, fentaNYL (SUBLIMAZE) injection, hydrALAZINE, ipratropium-albuterol, ondansetron    Subjective:   [name] was seen and examined today.  She  is more alert and answering simple questions.   Objective:   Vitals:   04/05/23 2015 04/06/23 0449 04/06/23 0755 04/06/23 0834  BP: (!) 110/44 116/61 130/84   Pulse: 86 89 (!) 40   Resp: 17 18 18    Temp: 98 F (36.7 C) 98.5 F (36.9 C) (!) 97.4 F (36.3 C)   TempSrc: Oral     SpO2: 100% 98% 100% 98%  Weight:      Height:        Intake/Output Summary (Last 24 hours) at 04/06/2023 1135 Last data filed at 04/06/2023 0359 Gross per 24 hour  Intake 792.62 ml  Output --  Net 792.62 ml    Filed Weights   04/04/23 2012  Weight: 60.2 kg     Exam General exam: Appears calm and comfortable  Respiratory system: Clear to  auscultation. Respiratory effort normal. Cardiovascular system: S1 & S2 heard, RRR. No JVD,  Gastrointestinal system: Abdomen is nondistended, soft and nontender.  Central nervous system: Alert and oriented to self.  Extremities: Symmetric 5 x 5 power. Skin: No rashes,  Psychiatry:  Mood & affect appropriate.     Data Reviewed:  I have personally reviewed following labs and imaging studies   CBC Lab Results  Component Value Date   WBC 9.8 04/06/2023   RBC 4.80 04/06/2023   HGB 14.0 04/06/2023   HCT 43.1 04/06/2023   MCV 89.8 04/06/2023   MCH 29.2 04/06/2023   PLT 81 (L) 04/06/2023   MCHC 32.5 04/06/2023   RDW 12.7 04/06/2023   LYMPHSABS 3.4 04/06/2023   MONOABS 0.7 04/06/2023   EOSABS 0.4 04/06/2023   BASOSABS 0.1 04/06/2023     Last metabolic panel Lab Results  Component Value Date   NA 142 04/06/2023   K 3.8 04/06/2023   CL 106 04/06/2023   CO2 28 04/06/2023   BUN 26 (H) 04/06/2023   CREATININE 1.08 (H) 04/06/2023   GLUCOSE 153 (H) 04/06/2023   GFRNONAA 50 (L) 04/06/2023   GFRAA >90 02/22/2014   CALCIUM 6.9 (L) 04/06/2023   PROT 5.1 (L) 04/04/2023   ALBUMIN 2.7 (L) 04/04/2023   BILITOT 1.8 (H) 04/04/2023   ALKPHOS 62 04/04/2023   AST 37 04/04/2023   ALT 45 (H) 04/04/2023   ANIONGAP 8 04/06/2023    CBG (last 3)  Recent Labs    04/04/23 1334 04/04/23 2330  GLUCAP 137* 155*       Coagulation Profile: No results for input(s): "INR", "PROTIME" in the last 168 hours.   Radiology Studies: CT HEAD WO CONTRAST ( )  Result Date: 04/04/2023 CLINICAL DATA:  Provided history: Delirium. EXAM: CT HEAD WITHOUT CONTRAST TECHNIQUE: Contiguous axial images were obtained from the base of the skull through the vertex without intravenous contrast. RADIATION DOSE REDUCTION: This exam was performed according to the departmental dose-optimization program which includes automated exposure control, adjustment of the mA and/or kV according to patient size and/or use of  iterative reconstruction technique. COMPARISON:  Head CT 03/02/2023 and earlier. FINDINGS: Brain: Moderate-to-advanced generalized cerebral atrophy. Mild cerebellar atrophy. Patchy and ill-defined hypoattenuation within the cerebral white matter, nonspecific but compatible with moderate-to-advanced chronic small vessel ischemic disease. There is  no acute intracranial hemorrhage. No demarcated cortical infarct. No extra-axial fluid collection. No evidence of an intracranial mass. No midline shift. Vascular: No hyperdense vessel.  Atherosclerotic calcifications. Skull: No fracture or aggressive osseous lesion. Sinuses/Orbits: No mass or acute finding within the imaged orbits. No significant paranasal sinus disease at the imaged levels. IMPRESSION: 1.  No evidence of an acute intracranial abnormality. 2. Parenchymal atrophy and chronic small vessel ischemic disease, as described. Electronically Signed   By: Jackey Loge D.O.   On: 04/04/2023 18:25   US RENAL  Result Date: 04/04/2023 CLINICAL DATA:  Acute renal insufficiency, hypertension EXAM: RENAL / URINARY TRACT ULTRASOUND COMPLETE COMPARISON:  None Available. FINDINGS: Right Kidney: Renal measurements: 8.3 x 4.3 x 4.7 cm = volume: 87.1 mL. Echogenicity within normal limits. No mass or hydronephrosis visualized. Left Kidney: Renal measurements: 8.2 x 4.7 x 4.2 cm = volume: 83.7 mL. Echogenicity within normal limits. No mass or hydronephrosis visualized. Bladder: Appears normal for degree of bladder distention. Other: Incidental splenule noted in the left upper quadrant. Limited imaging of the liver demonstrates increased parenchymal echotexture compatible with hepatic steatosis. IMPRESSION: 1. Unremarkable renal ultrasound. 2. Hepatic steatosis. Electronically Signed   By: Sharlet Salina M.D.   On: 04/04/2023 18:09   DG Chest Portable 1 View  Result Date: 04/04/2023 CLINICAL DATA:  Hypoxia. EXAM: PORTABLE CHEST 1 VIEW COMPARISON:  Mar 02, 2023. FINDINGS: The  heart size and mediastinal contours are within normal limits. Both lungs are clear. The visualized skeletal structures are unremarkable. IMPRESSION: No active disease. Aortic Atherosclerosis (ICD10-I70.0). Electronically Signed   By: Lupita Raider M.D.   On: 04/04/2023 14:30       Kathlen Mody M.D. [facility] 04/06/2023, 11:35 AM  Available via Epic secure chat 7am-7pm After 7 pm, please refer to night coverage provider listed on amion.

## 2023-04-06 NOTE — Progress Notes (Signed)
Speech Language Pathology Treatment: Dysphagia  Patient Details Name: [name] MRN: [id number] DOB: Jan 06, 1936 Today's Date: 04/06/2023 Time: 7829-5621 SLP Time Calculation (min) (ACUTE ONLY): 24 min  Assessment / Plan / Recommendation Clinical Impression  Pt was seen during breakfast for dysphagia treatment. She was alert and cooperative during the session and consumed a meal of scrambled eggs, an English muffin, bacon, breakfast potatoes, and thin liquids. Pt tolerated solids without difficulty, but coughing continues to be noted with thin liquids via straw and also with cup sips. A single instance of coughing was also observed with the initial bolus of nectar thick liquids via straw, but not with subsequent boluses. A modified barium swallow study is recommended to further assess physiology and it is scheduled for today at 1230.    HPI HPI: Pt is an 87 y.o. female who presented from SNF for evaluation of acute confusion. Dx acute metabolic encephalopathy secondary to UTI and AKI. CT head negative for acute changes. CXR negative for active disease. PMH: advanced dementia, HTN HLD, COPD, hypothyroidism.      SLP Plan  MBS (at 1230)      Recommendations for follow up therapy are one component of a multi-disciplinary discharge planning process, led by the attending physician.  Recommendations may be updated based on patient status, additional functional criteria and insurance authorization.    Recommendations  Diet recommendations: Regular;Thin liquid Liquids provided via: Cup;Straw Medication Administration: Whole meds with puree (or crushed with puree) Supervision: Staff to assist with self feeding Compensations: Small sips/bites;Slow rate Postural Changes and/or Swallow Maneuvers: Seated upright 90 degrees                  Oral care BID     Dysphagia, unspecified (R13.10)     MBS (at 1230)    Azalie Harbeck [name], MS, CCC-SLP [facility]   Acute Rehabilitation Services Office number: [phone]  [name]  04/06/2023, 9:44 AM

## 2023-04-06 NOTE — [place] FL2 (Signed)
[place] MEDICAID FL2 LEVEL OF CARE FORM     IDENTIFICATION  Patient Name: [name] Birthdate: 1936-07-20 Sex: female Admission Date (Current Location): 04/04/2023  [facility] and [place] Number:  Producer, television/film/video and Address:  [facility] [place], [address] [place], [place] [address]      Provider Number: [id number]  Attending Physician Name and Address:  Kathlen Mody, MD  Relative Name and Phone Number:       Current Level of Care: Hospital Recommended Level of Care: Skilled Nursing Facility Prior Approval Number:    Date Approved/Denied:   PASRR Number: [id number]  Discharge Plan: SNF    Current Diagnoses: Patient Active Problem List   Diagnosis Date Noted   AKI (acute kidney injury) (HCC) 04/05/2023   Acute encephalopathy 04/04/2023   AMS (altered mental status) 04/04/2023   Fall at home, initial encounter 03/03/2023   Urinary tract infection with hematuria 03/03/2023   Acute respiratory failure with hypoxia (HCC) 03/03/2023   Leukocytosis 03/03/2023   Hypocalcemia 03/03/2023   Renal insufficiency 03/03/2023   Polycythemia 03/03/2023   Thrombocytopenia (HCC) 03/03/2023   Hyperglycemia 11/19/2022   Atherosclerosis of aorta (HCC) 02/01/2021   Diverticulitis 11/02/2020   Diarrhea 11/02/2020   Venous incompetence 06/03/2020   Orthostatic lightheadedness 10/09/2019   History of colonic polyps 08/10/2015   Lumbago 06/22/2015   Tobacco dependence 06/22/2015   Alzheimer's dementia (HCC) 06/23/2014   Enteritis 02/21/2014   Gastroenteritis 02/21/2014   Hypokalemia 09/24/2013   Actinic keratoses 11/25/2012   COPD with acute exacerbation (HCC)    Hypertension    Loss of weight 08/22/2011   Fatigue 08/22/2011   Benign neoplasm of colon 08/09/2011   Diverticulosis of colon (without mention of hemorrhage) 08/09/2011   Family history of malignant neoplasm of gastrointestinal tract 08/08/2011   Nausea & vomiting 08/08/2011    HAND PAIN 05/30/2010   SMOKER 08/03/2009   NEOPLASM, SKIN, UNCERTAIN BEHAVIOR 01/27/2009   Memory loss 01/27/2009   Edema 06/08/2008   HIP PAIN, RIGHT 06/04/2008   BACK PAIN 06/04/2008   OTHER MALAISE AND FATIGUE 11/18/2007   Prediabetes 11/18/2007   CHEST PAIN, UNSPECIFIED 10/22/2007   Acute bronchitis 10/07/2007   Dyslipidemia 07/24/2007   Osteoporosis 07/24/2007   Hypothyroidism 05/08/2007   Carotid artery stenosis 05/08/2007   PVD (peripheral vascular disease) (HCC) 05/08/2007   GERD (gastroesophageal reflux disease) 05/08/2007   MIGRAINES, HX OF 05/08/2007    Orientation RESPIRATION BLADDER Height & Weight     Self  O2 (2L nasal cannula) Incontinent Weight: 132 lb 11.5 oz (60.2 kg) Height:  5' 2.01" (157.5 cm)  BEHAVIORAL SYMPTOMS/MOOD NEUROLOGICAL BOWEL NUTRITION STATUS      Incontinent Diet (See dc summary)  AMBULATORY STATUS COMMUNICATION OF NEEDS Skin   Extensive Assist Verbally                         Personal Care Assistance Level of Assistance  Bathing, Feeding, Dressing Bathing Assistance: Maximum assistance Feeding assistance: Limited assistance Dressing Assistance: Limited assistance     Functional Limitations Info             SPECIAL CARE FACTORS FREQUENCY                       Contractures Contractures Info: Not present    Additional Factors Info  Code Status, Allergies, Psychotropic Code Status Info: DNR Allergies Info: Codeine Sulfate, Exelon (Rivastigmine Tartrate), Oxycodone-acetaminophen Psychotropic Info: See  dc summary         Current Medications (04/06/2023):  This is the current hospital active medication list Current Facility-Administered Medications  Medication Dose Route Frequency Provider Last Rate Last Admin   acetaminophen (TYLENOL) tablet 650 mg  650 mg Oral Q6H PRN Mikey College T, MD   650 mg at 04/06/23 0957   aspirin chewable tablet 81 mg  81 mg Oral Daily Lurene Shadow, MD   81 mg at 04/06/23 0957    cefTRIAXone (ROCEPHIN) 1 g in sodium chloride 0.9 % 100 mL IVPB  1 g Intravenous Q24H Emeline General, MD   Stopped at 04/05/23 1856   donepezil (ARICEPT) tablet 10 mg  10 mg Oral QHS Mikey College T, MD   10 mg at 04/05/23 2123   feeding supplement (ENSURE ENLIVE / ENSURE PLUS) liquid 237 mL  237 mL Oral BID BM Mikey College T, MD   237 mL at 04/06/23 1523   fentaNYL (SUBLIMAZE) injection 12.5-25 mcg  12.5-25 mcg Intravenous Q2H PRN Opyd, Lavone Neri, MD       fluticasone (FLONASE) 50 MCG/ACT nasal spray 2 spray  2 spray Each Nare Daily Mikey College T, MD   2 spray at 04/06/23 0959   hydrALAZINE (APRESOLINE) injection 2 mg  2 mg Intravenous Q6H PRN Mikey College T, MD       ipratropium-albuterol (DUONEB) 0.5-2.5 (3) MG/3ML nebulizer solution 3 mL  3 mL Nebulization Q6H PRN Mikey College T, MD       levothyroxine (SYNTHROID) tablet 50 mcg  50 mcg Oral QAC breakfast Mikey College T, MD   50 mcg at 04/06/23 0619   loratadine (CLARITIN) tablet 10 mg  10 mg Oral Daily Mikey College T, MD   10 mg at 04/06/23 0957   memantine (NAMENDA) tablet 10 mg  10 mg Oral BID Mikey College T, MD   10 mg at 04/06/23 0957   multivitamin (RENA-VIT) tablet 1 tablet  1 tablet Oral QHS Kathlen Mody, MD   1 tablet at 04/05/23 2123   nicotine (NICODERM CQ - dosed in mg/24 hours) patch 14 mg  14 mg Transdermal Daily Kathlen Mody, MD   14 mg at 04/06/23 0957   ondansetron (ZOFRAN) tablet 4 mg  4 mg Oral Q6H PRN Mikey College T, MD       pantoprazole (PROTONIX) EC tablet 40 mg  40 mg Oral Daily Leander Rams, RPH   40 mg at 04/06/23 1522   simvastatin (ZOCOR) tablet 40 mg  40 mg Oral q1800 Mikey College T, MD   40 mg at 04/05/23 1822   umeclidinium bromide (INCRUSE ELLIPTA) 62.5 MCG/ACT 1 puff  1 puff Inhalation Daily Emeline General, MD   1 puff at 04/06/23 7829     Discharge Medications: Please see discharge summary for a list of discharge medications.  Relevant Imaging Results:  Relevant Lab Results:   Additional Information SSN  [id number]. OP Palliative to follow at [facility], LCSW

## 2023-04-06 NOTE — Progress Notes (Signed)
Modified Barium Swallow Study  Patient Details  Name: [name] MRN: [id number] Date of Birth: February 04, 1936  Today's Date: 04/06/2023  Modified Barium Swallow completed.  Full report located under Chart Review in the Imaging Section.  History of Present Illness Pt is an 87 y.o. female who presented from SNF for evaluation of acute confusion. Dx acute metabolic encephalopathy secondary to UTI and AKI. CT head negative for acute changes. CXR negative for active disease. PMH: advanced dementia, HTN HLD, COPD, hypothyroidism.   Clinical Impression Pt presents with oropharyngeal dysphagia characterized by impaired bolus cohesion, a pharyngeal delay, and reduction in tongue base retraction, pharyngeal stripping, and PES distention. Pt demonstrated vallecular residue, and posterior pharyngeal wall residue which was improved with secondary swallows. Penetration (PAS 3, 5) and trace silent aspiration (PAS 8) were noted with thin liquids via cup and straw secondary to the pharyngeal delay. Prompted coughing was ineffective in expelling penetrated material. Laryngeal invasion was eliminated with use of small individual bolus sizes but, despite prompts, pt exhibited difficulty consistently doing this. Considering pt's presentation at bedside, SLP supects that larger amounts of aspirated material trigger coughing, but the effectiveness of pt's cough is still questioned. A regular texture diet with nectar thick liquids is recommended at this time with continued full supervision for meals. SLP will follow for dysphagia treatment. Factors that may increase risk of adverse event in presence of aspiration [name] & [name] 2021): Reduced cognitive function;Weak cough  Swallow Evaluation Recommendations Recommendations: PO diet PO Diet Recommendation: Regular;Mildly thick liquids (Level 2, nectar thick) Liquid Administration via: Cup;Straw Medication Administration: Crushed with puree Supervision: Staff  to assist with self-feeding;Full supervision/cueing for swallowing strategies Postural changes: Stay upright 30-60 min after meals;Position pt fully upright for meals Oral care recommendations: Oral care BID (2x/day)   [name] [name], MS, CCC-SLP Neuro Diagnostic Specialist  Acute Rehabilitation Services Office number: [phone]  [name] 04/06/2023,2:01 PM

## 2023-04-06 NOTE — TOC Initial Note (Signed)
Transition of Care [facility]) - Initial/Assessment Note    Patient Details  Name: [name] MRN: [id number] Date of Birth: 11-18-1935  Transition of Care [facility]) CM/SW Contact:    Antonietta Lansdowne A [place], Theresia Majors Phone Number: 04/06/2023, 2:38 PM  Clinical Narrative:                  CSW contacted pt's son, [name],  to complete assessment at pt is only oriented to person.   He stated that pt has been at [facility] but family is in the process of moving pt to Memory Care facility in the [place] area for longer term going forward and that [employer] is aware of plan. He informed CSW that provider informed family that pt would be possible DC over the weekend.   CSW  also presented pt choice for palliative outpatient to follow pt to facility of [place] at discharge as CSW was informed by palliative team that pt's family wanted a Palliative outpatient organization to follow up with family at discharge.   [name] said that [facility] would be his choice for palliative outpatient.   CSW will complete palliative referral to [facility] to follow pt once she DC to facility.  CSW to follow up with Lowella Bandy at [facility] regarding discharge.   TOC will continue to follow.      Expected Discharge Plan: Skilled Nursing Facility Barriers to Discharge: Continued Medical Work up   Patient Goals and CMS Choice            Expected Discharge Plan and Services       Living arrangements for the past 2 months: Skilled Nursing Facility                                      Prior Living Arrangements/Services Living arrangements for the past 2 months: Skilled Nursing Facility Lives with:: Facility Resident                   Activities of Daily Living Home Assistive Devices/Equipment: Wheelchair ADL Screening (condition at time of admission) Patient's cognitive ability adequate to safely complete daily activities?: No Is the patient deaf or have difficulty  hearing?: Yes Does the patient have difficulty seeing, even when wearing glasses/contacts?: Yes Does the patient have difficulty concentrating, remembering, or making decisions?: Yes Patient able to express need for assistance with ADLs?: No Does the patient have difficulty dressing or bathing?: Yes Independently performs ADLs?: No Communication: Independent Does the patient have difficulty walking or climbing stairs?: Yes Weakness of Legs: Both Weakness of Arms/Hands: Both  Permission Sought/Granted                  Emotional Assessment Appearance:: Appears older than stated age Attitude/Demeanor/Rapport: Unable to Assess Affect (typically observed): Unable to Assess Orientation: : Oriented to Self Alcohol / Substance Use: Not Applicable Psych Involvement: No (comment)  Admission diagnosis:  Hypocalcemia [E83.51] Dehydration [E86.0] Hypokalemia [E87.6] AKI (acute kidney injury) (HCC) [N17.9] AMS (altered mental status) [R41.82] Patient Active Problem List   Diagnosis Date Noted   AKI (acute kidney injury) (HCC) 04/05/2023   Acute encephalopathy 04/04/2023   AMS (altered mental status) 04/04/2023   Fall at home, initial encounter 03/03/2023   Urinary tract infection with hematuria 03/03/2023   Acute respiratory failure with hypoxia (HCC) 03/03/2023   Leukocytosis 03/03/2023   Hypocalcemia 03/03/2023   Renal insufficiency 03/03/2023  Polycythemia 03/03/2023   Thrombocytopenia (HCC) 03/03/2023   Hyperglycemia 11/19/2022   Atherosclerosis of aorta (HCC) 02/01/2021   Diverticulitis 11/02/2020   Diarrhea 11/02/2020   Venous incompetence 06/03/2020   Orthostatic lightheadedness 10/09/2019   History of colonic polyps 08/10/2015   Lumbago 06/22/2015   Tobacco dependence 06/22/2015   Alzheimer's dementia (HCC) 06/23/2014   Enteritis 02/21/2014   Gastroenteritis 02/21/2014   Hypokalemia 09/24/2013   Actinic keratoses 11/25/2012   COPD with acute exacerbation (HCC)     Hypertension    Loss of weight 08/22/2011   Fatigue 08/22/2011   Benign neoplasm of colon 08/09/2011   Diverticulosis of colon (without mention of hemorrhage) 08/09/2011   Family history of malignant neoplasm of gastrointestinal tract 08/08/2011   Nausea & vomiting 08/08/2011   HAND PAIN 05/30/2010   SMOKER 08/03/2009   NEOPLASM, SKIN, UNCERTAIN BEHAVIOR 01/27/2009   Memory loss 01/27/2009   Edema 06/08/2008   HIP PAIN, RIGHT 06/04/2008   BACK PAIN 06/04/2008   OTHER MALAISE AND FATIGUE 11/18/2007   Prediabetes 11/18/2007   CHEST PAIN, UNSPECIFIED 10/22/2007   Acute bronchitis 10/07/2007   Dyslipidemia 07/24/2007   Osteoporosis 07/24/2007   Hypothyroidism 05/08/2007   Carotid artery stenosis 05/08/2007   PVD (peripheral vascular disease) (HCC) 05/08/2007   GERD (gastroesophageal reflux disease) 05/08/2007   MIGRAINES, HX OF 05/08/2007   PCP:  [name], MD Pharmacy:   [facility] - [place], [place] - [address] [place] [address] Phone: [phone] Fax: [phone]     Social Determinants of Health (SDOH) Social History: SDOH Screenings   Food Insecurity: Patient Unable To Answer (04/04/2023)  Housing: Patient Unable To Answer (04/04/2023)  Transportation Needs: Patient Unable To Answer (04/04/2023)  Utilities: Patient Unable To Answer (04/04/2023)  Alcohol Screen: Low Risk  (06/06/2022)  Depression (PHQ2-9): Low Risk  (11/16/2022)  Financial Resource Strain: Low Risk  (06/06/2022)  Physical Activity: Inactive (06/06/2022)  Social Connections: Socially Integrated (06/06/2022)  Stress: No Stress Concern Present (06/06/2022)  Tobacco Use: High Risk (04/04/2023)   SDOH Interventions:     Readmission Risk Interventions     No data to display

## 2023-04-06 NOTE — Progress Notes (Addendum)
Daily Progress Note   Patient Name: [name]       Date: 04/06/2023 DOB: Sep 17, 1936  Age: 87 y.o. MRN#: [id number] Attending Physician: Kathlen Mody, MD Primary Care Physician: [name], MD Admit Date: 04/04/2023  Reason for Consultation/Follow-up: Establishing goals of care  Subjective: I have reviewed medical records including EPIC notes, MAR, and labs. Received report from primary RN - no acute concerns. Per RN, patient has just returned from [facility].  Went to visit patient and family at bedside for scheduled 1p meeting. Patient was lying in bed awake, alert, confused, and able to participate in simple conversation. No signs or non-verbal gestures of pain or discomfort noted. No respiratory distress, increased work of breathing, or secretions noted. She is on 2L O2 [place].  Met with patient's son/HCPOA/[name] and daughter in law/Karen in private room for ongoing GOC.  Introduced, reviewed in detail, and completed MOST form as outlined under Recommendation section below.  Therapeutic listening provided as they reflect on happy memories with the patient. They confirm goals are for her discharge back to [facility] with outpatient Palliative Care. They would like her transferred via non emergent transport but have insurance questions - will notify TOC. They have found a memory care facility close to their home they will be working to get patient transferred to the week of July 8th.   AVS education completed as family had questions about what medications patient would discharged on.  All questions and concerns addressed. Encouraged to call with questions and/or concerns. PMT card provided.  Length of Stay: 1  Current Medications: Scheduled Meds:   aspirin  81 mg Oral Daily    donepezil  10 mg Oral QHS   feeding supplement  237 mL Oral BID BM   fluticasone  2 spray Each Nare Daily   levothyroxine  50 mcg Oral QAC breakfast   loratadine  10 mg Oral Daily   memantine  10 mg Oral BID   multivitamin  1 tablet Oral QHS   nicotine  14 mg Transdermal Daily   pantoprazole (PROTONIX) IV  40 mg Intravenous Q24H   simvastatin  40 mg Oral q1800   umeclidinium bromide  1 puff Inhalation Daily    Continuous Infusions:  cefTRIAXone (ROCEPHIN)  IV Stopped (04/05/23 1856)    PRN Meds: acetaminophen, fentaNYL (SUBLIMAZE)  injection, hydrALAZINE, ipratropium-albuterol, ondansetron  Physical Exam Vitals and nursing note reviewed.  Constitutional:      General: She is not in acute distress. Pulmonary:     Effort: No respiratory distress.  Skin:    General: Skin is warm and dry.  Neurological:     Mental Status: She is alert. She is disoriented and confused.     Motor: Weakness present.  Psychiatric:        Attention and Perception: Attention normal.        Behavior: Behavior is cooperative.        Cognition and Memory: Cognition is impaired. Memory is impaired.             Vital Signs: BP 130/84 (BP Location: Left Arm)   Pulse (!) 40   Temp (!) 97.4 F (36.3 C)   Resp 18   Ht 5' 2.01" (1.575 m)   Wt 60.2 kg   SpO2 98%   BMI 24.27 kg/m  SpO2: SpO2: 98 % O2 Device: O2 Device: Nasal Cannula O2 Flow Rate: O2 Flow Rate (L/min): 2 L/min  Intake/output summary:  Intake/Output Summary (Last 24 hours) at 04/06/2023 1249 Last data filed at 04/06/2023 0359 Gross per 24 hour  Intake 792.62 ml  Output --  Net 792.62 ml   LBM: Last BM Date : 04/05/23 Baseline Weight: Weight: 60.2 kg Most recent weight: Weight: 60.2 kg       Palliative Assessment/Data: PPS 30%      Patient Active Problem List   Diagnosis Date Noted   AKI (acute kidney injury) (HCC) 04/05/2023   Acute encephalopathy 04/04/2023   AMS (altered mental status) 04/04/2023   Fall at home,  initial encounter 03/03/2023   Urinary tract infection with hematuria 03/03/2023   Acute respiratory failure with hypoxia (HCC) 03/03/2023   Leukocytosis 03/03/2023   Hypocalcemia 03/03/2023   Renal insufficiency 03/03/2023   Polycythemia 03/03/2023   Thrombocytopenia (HCC) 03/03/2023   Hyperglycemia 11/19/2022   Atherosclerosis of aorta (HCC) 02/01/2021   Diverticulitis 11/02/2020   Diarrhea 11/02/2020   Venous incompetence 06/03/2020   Orthostatic lightheadedness 10/09/2019   History of colonic polyps 08/10/2015   Lumbago 06/22/2015   Tobacco dependence 06/22/2015   Alzheimer's dementia (HCC) 06/23/2014   Enteritis 02/21/2014   Gastroenteritis 02/21/2014   Hypokalemia 09/24/2013   Actinic keratoses 11/25/2012   COPD with acute exacerbation (HCC)    Hypertension    Loss of weight 08/22/2011   Fatigue 08/22/2011   Benign neoplasm of colon 08/09/2011   Diverticulosis of colon (without mention of hemorrhage) 08/09/2011   Family history of malignant neoplasm of gastrointestinal tract 08/08/2011   Nausea & vomiting 08/08/2011   HAND PAIN 05/30/2010   SMOKER 08/03/2009   NEOPLASM, SKIN, UNCERTAIN BEHAVIOR 01/27/2009   Memory loss 01/27/2009   Edema 06/08/2008   HIP PAIN, RIGHT 06/04/2008   BACK PAIN 06/04/2008   OTHER MALAISE AND FATIGUE 11/18/2007   Prediabetes 11/18/2007   CHEST PAIN, UNSPECIFIED 10/22/2007   Acute bronchitis 10/07/2007   Dyslipidemia 07/24/2007   Osteoporosis 07/24/2007   Hypothyroidism 05/08/2007   Carotid artery stenosis 05/08/2007   PVD (peripheral vascular disease) (HCC) 05/08/2007   GERD (gastroesophageal reflux disease) 05/08/2007   MIGRAINES, HX OF 05/08/2007    Palliative Care Assessment & Plan   Patient Profile: 87 y.o. female  with past medical history of advanced dementia, HTN HLD, COPD, hypothyroidism presented to ED on 04/04/23 from [facility] with AMS and reported abnormal labs. Patient was admitted on 04/04/2023 with  acute metabolic  encephalopathy, AKI, severe hypokalemia.    Of note, patient was recently hospitalized 5/24-5/29/24 after a fall.   Assessment: Principal Problem:   AMS (altered mental status) Active Problems:   Alzheimer's dementia (HCC)   Urinary tract infection with hematuria   Acute encephalopathy   AKI (acute kidney injury) (HCC)     Recommendations/Plan: Continue to treat the treatable Continue DNR/DNI  MOST form completed as follows: DNR, Comfort measures (do not intubate), Determine use or limitation of antibiotics when infection occurs, IVF for defined trial period, no feeding tube.  Goal is for discharge back to [facility] rehab with outpatient Palliative Care to follow - [facility] consult previously placed Long term, family will transition patient to memory care facility closer to their home in [place]  Original DNR and MOST form placed in shadow chart. Copies were made and will be scanned into Vynca/ACP tab PMT will continue to follow peripherally. If there are any imminent needs please call the service directly  Goals of Care and Additional Recommendations: Limitations on Scope of Treatment: Full Scope Treatment, No Artificial Feeding, and No Tracheostomy  Code Status:    Code Status Orders  (From admission, onward)           Start     Ordered   04/05/23 1510  Do not attempt resuscitation (DNR)  Continuous       Question Answer Comment  If patient has no pulse and is not breathing Do Not Attempt Resuscitation   If patient has a pulse and/or is breathing: Medical Treatment Goals LIMITED ADDITIONAL INTERVENTIONS: Use medication/IV fluids and cardiac monitoring as indicated; Do not use intubation or mechanical ventilation (DNI), also provide comfort medications.  Transfer to Progressive/Stepdown as indicated, avoid Intensive Care.   Consent: Discussion documented in EHR or advanced directives reviewed      04/05/23 1509           Code Status History     Date Active Date  Inactive Code Status Order ID Comments User Context   04/04/2023 1800 04/05/2023 1509 Full Code [id number]  Emeline General, MD ED   03/03/2023 0805 03/07/2023 1812 Full Code [id number]  Clydie Braun, MD ED   02/21/2014 0039 02/23/2014 1631 Full Code [id number]  Hillary Bow, DO ED   08/10/2011 1338 08/12/2011 1513 Full Code [id number]  Monica Becton, RN Inpatient       Prognosis:  Unable to determine  Discharge Planning: Skilled Nursing Facility for rehab with Palliative care service follow-up  Care plan was discussed with primary RN, patient's son and daughter in law  Thank you for allowing the Palliative Medicine Team to assist in the care of this patient.   Total Time 55 minutes Prolonged Time Billed  no       Greater than 50%  of this time was spent counseling and coordinating care related to the above assessment and plan.  Haskel Khan, NP  Please contact Palliative Medicine Team phone at [phone] for questions and concerns.   *Portions of this note are a verbal dictation therefore any spelling and/or grammatical errors are due to the "Dragon Medical One" system interpretation.

## 2023-04-07 DIAGNOSIS — G301 Alzheimer's disease with late onset: Secondary | ICD-10-CM | POA: Diagnosis not present

## 2023-04-07 DIAGNOSIS — N3001 Acute cystitis with hematuria: Secondary | ICD-10-CM | POA: Diagnosis not present

## 2023-04-07 DIAGNOSIS — R410 Disorientation, unspecified: Secondary | ICD-10-CM | POA: Diagnosis not present

## 2023-04-07 DIAGNOSIS — N179 Acute kidney failure, unspecified: Secondary | ICD-10-CM | POA: Diagnosis not present

## 2023-04-07 NOTE — Progress Notes (Signed)
[facility] ([facility] Hospital Liaison Note  Notified by TOC manager of patient/family request for [facility] palliative services at home after discharge.   [facility] hospital liaison will follow patient for discharge disposition.   Please call with any hospice or outpatient palliative care related questions.   Thank you for the opportunity to participate in this patient's care.   Shanita Wicker, LCSW [facility] Hospital Liaison [phone]  

## 2023-04-07 NOTE — Progress Notes (Signed)
Speech Language Pathology Treatment: Dysphagia  Patient Details Name: [name] MRN: [id number] DOB: 08-Apr-1936 Today's Date: 04/07/2023 Time: 4098-1191 SLP Time Calculation (min) (ACUTE ONLY): 10 min  Assessment / Plan / Recommendation Clinical Impression  Patient seen by SLP for skilled treatment focused on dysphagia goals. Patient was awake, alert, pleasantly confused with oxygen [place] off and around her chin. SLP fixed [place] and raised HOB so patient could have some PO's.  Her lunch tray was in the room but appeared untouched. She was receptive to having some nectar thick juice and was able to feed self, drinking from cup. SLP did observe intermittent belching, no coughing or throat clearing observed. Patient pleasantly declined to have any solid PO's. SLP recommending continue current diet. Patient continues to require full supervision but should be encouraged and cued to feed herself as able. SLP will continue to follow while she is admitted and she would benefit from skilled SLP services at next venue of care.    HPI HPI: Pt is an 87 y.o. female who presented from SNF for evaluation of acute confusion. Dx acute metabolic encephalopathy secondary to UTI and AKI. CT head negative for acute changes. CXR negative for active disease. PMH: advanced dementia, HTN HLD, COPD, hypothyroidism.      SLP Plan  Continue with current plan of care      Recommendations for follow up therapy are one component of a multi-disciplinary discharge planning process, led by the attending physician.  Recommendations may be updated based on patient status, additional functional criteria and insurance authorization.    Recommendations  Diet recommendations: Regular;Nectar-thick liquid Liquids provided via: Cup;Straw Medication Administration: Whole meds with puree Supervision: Staff to assist with self feeding;Full supervision/cueing for compensatory strategies;Patient able to self feed Compensations:  Small sips/bites;Slow rate Postural Changes and/or Swallow Maneuvers: Seated upright 90 degrees                  Oral care BID     Dysphagia, oropharyngeal phase (R13.12)     Continue with current plan of care     Angela Nevin, MA, CCC-SLP Speech Therapy

## 2023-04-07 NOTE — Evaluation (Signed)
Occupational Therapy Evaluation Patient Details Name: [name] MRN: [id number] DOB: 1936-05-09 Today's Date: 04/07/2023   History of Present Illness 87 yo female admitted 6/26 from SNF with AMS. Pt with UTI and aKI. PMhx: advanced dementia, HTN HLD, COPD, hypothyroidism, GERD   Clinical Impression   [name] was evaluated s/p the above admission list. She is from SNF with unknown PLOF at baseline. Upon evaluation the pt was limited by impaired cognition, weakness, decreased activity tolerance and report of dizziness with standing. Overall she needed mod I for simple transfers with RW, unable to progress to stepping/ambulation. Due to the deficits listed below the pt also needs up to max A for LB ADLs and min A for UB ADLs. Assisted pt with breakfast, pt benefits from simple step-by-step cues to initiate and sequence self feeding task. Pt will benefit from continued acute OT services and skilled inpatient follow up therapy, <3 hours/day.       Recommendations for follow up therapy are one component of a multi-disciplinary discharge planning process, led by the attending physician.  Recommendations may be updated based on patient status, additional functional criteria and insurance authorization.   Assistance Recommended at Discharge Frequent or constant Supervision/Assistance  Patient can return home with the following A lot of help with walking and/or transfers;A lot of help with bathing/dressing/bathroom;Two people to help with bathing/dressing/bathroom;Assistance with cooking/housework;Direct supervision/assist for medications management;Direct supervision/assist for financial management;Help with stairs or ramp for entrance;Assist for transportation;Assistance with feeding    Functional Status Assessment  Patient has had a recent decline in their functional status and demonstrates the ability to make significant improvements in function in a reasonable and predictable amount of  time.  Equipment Recommendations  None recommended by OT    Recommendations for Other Services       Precautions / Restrictions Precautions Precautions: Fall;Other (comment) Precaution Comments: watch sats, incontinent B/B Restrictions Weight Bearing Restrictions: No      Mobility Bed Mobility Overal bed mobility: Needs Assistance             General bed mobility comments: OOB upon arrival    Transfers Overall transfer level: Needs assistance Equipment used: Rolling walker (2 wheels) Transfers: Sit to/from Stand, Bed to chair/wheelchair/BSC Sit to Stand: Mod assist                  Balance   Sitting-balance support: No upper extremity supported, Feet supported Sitting balance-Leahy Scale: Fair Sitting balance - Comments: guarding EOB for balance   Standing balance support: Bilateral upper extremity supported, During functional activity Standing balance-Leahy Scale: Poor Standing balance comment: poor standing tolerance, needs cues                           ADL either performed or assessed with clinical judgement   ADL Overall ADL's : Needs assistance/impaired Eating/Feeding: Minimal assistance;Sitting   Grooming: Minimal assistance;Sitting   Upper Body Bathing: Minimal assistance;Sitting   Lower Body Bathing: Maximal assistance;Sit to/from stand   Upper Body Dressing : Minimal assistance;Sitting   Lower Body Dressing: Maximal assistance;Sit to/from stand   Toilet Transfer: Moderate assistance;Stand-pivot;BSC/3in1 Toilet Transfer Details (indicate cue type and reason): simulated this date Toileting- Architect and Hygiene: Maximal assistance;Sit to/from stand       Functional mobility during ADLs: Moderate assistance;Rolling walker (2 wheels) General ADL Comments: requires simple step by step cues to complete all ADLs. significant assist needed for all standing tasks     Vision  Baseline Vision/History: 0 No visual  deficits Vision Assessment?: No apparent visual deficits Additional Comments: seemingly [facility]     Perception Perception Perception Tested?: No   Praxis Praxis Praxis tested?: Not tested    Pertinent Vitals/Pain Pain Assessment Pain Assessment: No/denies pain     Hand Dominance Right (assume based on observation of ADLs)   Extremity/Trunk Assessment Upper Extremity Assessment Upper Extremity Assessment: Generalized weakness   Lower Extremity Assessment Lower Extremity Assessment: Defer to PT evaluation   Cervical / Trunk Assessment Cervical / Trunk Assessment: Kyphotic   Communication Communication Communication: No difficulties   Cognition Arousal/Alertness: Awake/alert Behavior During Therapy: Flat affect Overall Cognitive Status: History of cognitive impairments - at baseline                                 General Comments: Needs min cues to initiate and sequence automatic tasks and ADLs (self feeding). Able to state name only.     General Comments  VSS. Pt reporting dizziness with standing    Exercises     Shoulder Instructions      Home Living Family/patient expects to be discharged to:: Skilled nursing facility                                 Additional Comments: Per chart, pt from [facility] and plans to return there with palliative OP follow up.      Prior Functioning/Environment Prior Level of Function : Needs assist             Mobility Comments: was walking with assist last admission, family not present to provide current PLOF ADLs Comments: assume needs assist with ADLs/IADLs        OT Problem List: Decreased strength;Decreased range of motion;Decreased activity tolerance;Impaired balance (sitting and/or standing);Decreased safety awareness;Decreased knowledge of use of DME or AE;Decreased knowledge of precautions;Decreased cognition;Pain      OT Treatment/Interventions: Self-care/ADL training;Therapeutic  exercise;DME and/or AE instruction;Therapeutic activities;Balance training;Patient/family education    OT Goals(Current goals can be found in the care plan section) Acute Rehab OT Goals Patient Stated Goal: to eat OT Goal Formulation: With patient Time For Goal Achievement: 04/21/23 Potential to Achieve Goals: Fair ADL Goals Pt Will Perform Grooming: with set-up;sitting Pt Will Perform Upper Body Dressing: with set-up;sitting Pt Will Perform Lower Body Dressing: with min assist;sit to/from stand Pt Will Transfer to Toilet: with min assist Pt Will Perform Toileting - Clothing Manipulation and hygiene: with min guard assist;sitting/lateral leans  OT Frequency: Min 2X/week    Co-evaluation              AM-PAC OT "6 Clicks" Daily Activity     Outcome Measure Help from another person eating meals?: A Little Help from another person taking care of personal grooming?: A Little Help from another person toileting, which includes using toliet, bedpan, or urinal?: A Lot Help from another person bathing (including washing, rinsing, drying)?: A Lot Help from another person to put on and taking off regular upper body clothing?: A Little Help from another person to put on and taking off regular lower body clothing?: A Lot 6 Click Score: 15   End of Session Equipment Utilized During Treatment: Gait belt;Rolling walker (2 wheels);Oxygen Nurse Communication: Mobility status  Activity Tolerance: Patient tolerated treatment well Patient left: in chair;with call bell/phone within reach;with chair alarm set  OT Visit  Diagnosis: Unsteadiness on feet (R26.81);Other abnormalities of gait and mobility (R26.89);Muscle weakness (generalized) (M62.81);History of falling (Z91.81)                Time: 8295-6213 OT Time Calculation (min): 17 min Charges:  OT General Charges $OT Visit: 1 Visit OT Evaluation $OT Eval Moderate Complexity: 1 Mod  Derenda Mis, OTR/L Acute Rehabilitation  Services Office [phone] Secure Chat Communication Preferred   Donia Pounds 04/07/2023, 11:05 AM

## 2023-04-07 NOTE — Progress Notes (Signed)
[facility]                                                                               [name], is a 87 y.o. female, DOB - 1936/01/05, ZOX:[id number] Admit date - 04/04/2023    Outpatient Primary MD for the patient is Plotnikov, Georgina Quint, MD  LOS - 2  days    Brief summary  [name] is a 87 y.o. female with medical history significant of advanced dementia, HTN HLD, COPD, hypothyroidism, sent from nursing home for evaluation of acute confusion.    Assessment & Plan    Assessment and Plan:   Acute metabolic encephalopathy  Secondary to UTI and AKI.  Admitted with a creatinine of 1.9, improved to 1. 36 to 1.09 With IV fluids.  She is more alert and awake and answering simple questions. Unfortunately urine cultures were not done on admission. On IV rocephin for UTI.  Plan for discharge in am.  She appears to be back to baseline.  Holding off lasix and losartan.      Severe hypokalemia:  Replaced, repeat level wnl.   Hypertension:  Well controlled.    Dementia:  Continue with Aricept and memantine.   Hyperlipidemia Continue with zocor.    Hypothyroidism; Resume synthroid.    Mild leukocytosis  Resolved.   COPD Required oxygen overnight, on 2 lit of [place] OXYGEN.  No wheezing heard. CXR on admission does not show any active disease.  Continue with duonebs  Bradycardia:  Resolved.    Type 2 DM:  A1c IS 7.1.  On metformin, which was held on admission. Can resume on discharge.    RN Pressure Injury Documentation:    Malnutrition Type:  Nutrition Problem: Inadequate oral intake Etiology: lethargy/confusion   Malnutrition Characteristics:  Signs/Symptoms: per patient/family report   Nutrition Interventions:  Interventions: Ensure Enlive (each supplement provides 350kcal and 20 grams of protein), MVI  Estimated body mass index is 24.27 kg/m as calculated from the following:   Height as of this  encounter: 5' 2.01" (1.575 m).   Weight as of this encounter: 60.2 kg.  Code Status: full code.  DVT Prophylaxis:  Place and maintain sequential compression device Start: 04/05/23 1322   Level of Care: Level of care: Med-Surg Family Communication: Updated patient's son at bedside.   Disposition Plan:     Remains inpatient appropriate:  IV rocephin and IV fluids.   Procedures:  None.  Consultants:   Palliative care   Antimicrobials:   Anti-infectives (From admission, onward)    Start     Dose/Rate Route Frequency Ordered Stop   04/04/23 1730  cefTRIAXone (ROCEPHIN) 1 g in sodium chloride 0.9 % 100 mL IVPB        1 g 200 mL/hr over 30 Minutes Intravenous Every 24 hours 04/04/23 1722          Medications  Scheduled Meds:  aspirin  81 mg Oral Daily   donepezil  10 mg Oral QHS   feeding supplement  237 mL Oral BID BM   fluticasone  2 spray Each Nare Daily   levothyroxine  50 mcg Oral QAC breakfast   loratadine  10  mg Oral Daily   memantine  10 mg Oral BID   multivitamin  1 tablet Oral QHS   nicotine  14 mg Transdermal Daily   pantoprazole  40 mg Oral Daily   simvastatin  40 mg Oral q1800   umeclidinium bromide  1 puff Inhalation Daily   Continuous Infusions:  cefTRIAXone (ROCEPHIN)  IV 1 g (04/06/23 1727)   PRN Meds:.acetaminophen, fentaNYL (SUBLIMAZE) injection, hydrALAZINE, ipratropium-albuterol, ondansetron    Subjective:   [name] was seen and examined today.  On oxygen at 2 lit, unclear. No cough.   Objective:   Vitals:   04/07/23 0455 04/07/23 0812 04/07/23 0826 04/07/23 1013  BP: 124/82 117/66    Pulse: 90 80 83   Resp: 18 15 16    Temp:  98.1 F (36.7 C)    TempSrc:  Oral    SpO2: 91% 92% 93% (!) 87%  Weight:      Height:       No intake or output data in the 24 hours ending 04/07/23 1542  Filed Weights   04/04/23 2012  Weight: 60.2 kg     Exam General exam: Appears calm and comfortable  Respiratory system: Clear to  auscultation. Respiratory effort normal. Cardiovascular system: S1 & S2 heard, RRR. No JVD,  Gastrointestinal system: Abdomen is nondistended, soft and nontender.  Central nervous system: Alert and oriented to self only.  Extremities: Symmetric 5 x 5 power. Skin: No rashes, Psychiatry: Mood & affect appropriate.      Data Reviewed:  I have personally reviewed following labs and imaging studies   CBC Lab Results  Component Value Date   WBC 9.8 04/06/2023   RBC 4.80 04/06/2023   HGB 14.0 04/06/2023   HCT 43.1 04/06/2023   MCV 89.8 04/06/2023   MCH 29.2 04/06/2023   PLT 81 (L) 04/06/2023   MCHC 32.5 04/06/2023   RDW 12.7 04/06/2023   LYMPHSABS 3.4 04/06/2023   MONOABS 0.7 04/06/2023   EOSABS 0.4 04/06/2023   BASOSABS 0.1 04/06/2023     Last metabolic panel Lab Results  Component Value Date   NA 142 04/06/2023   K 3.8 04/06/2023   CL 106 04/06/2023   CO2 28 04/06/2023   BUN 26 (H) 04/06/2023   CREATININE 1.08 (H) 04/06/2023   GLUCOSE 153 (H) 04/06/2023   GFRNONAA 50 (L) 04/06/2023   GFRAA >90 02/22/2014   CALCIUM 6.9 (L) 04/06/2023   PROT 5.1 (L) 04/04/2023   ALBUMIN 2.7 (L) 04/04/2023   BILITOT 1.8 (H) 04/04/2023   ALKPHOS 62 04/04/2023   AST 37 04/04/2023   ALT 45 (H) 04/04/2023   ANIONGAP 8 04/06/2023    CBG (last 3)  Recent Labs    04/04/23 2330  GLUCAP 155*       Coagulation Profile: No results for input(s): "INR", "PROTIME" in the last 168 hours.   Radiology Studies: DG Swallowing Func-Speech Pathology  Result Date: 04/06/2023 Table formatting from the original result was not included. Modified Barium Swallow Study Patient Details Name: [name] MRN: [id number] Date of Birth: Aug 18, 1936 Today's Date: 04/06/2023 HPI/PMH: HPI: Pt is an 87 y.o. female who presented from SNF for evaluation of acute confusion. Dx acute metabolic encephalopathy secondary to UTI and AKI. CT head negative for acute changes. CXR negative for active disease.  PMH: advanced dementia, HTN HLD, COPD, hypothyroidism. Clinical Impression: Clinical Impression: Pt presents with oropharyngeal dysphagia characterized by impaired bolus cohesion, a pharyngeal delay, and reduction in tongue base retraction, pharyngeal stripping, and  PES distention. Pt demonstrated vallecular residue, and posterior pharyngeal wall residue which was improved with secondary swallows. Penetration (PAS 3, 5) and trace silent aspiration (PAS 8) were noted with thin liquids via cup and straw secondary to the pharyngeal delay. Prompted coughing was ineffective in expelling penetrated material. Laryngeal invasion was eliminated with use of small individual bolus sizes but, despite prompts, pt exhibited difficulty consistently doing this. Considering pt's presentation at bedside, SLP supects that larger amounts of aspirated material trigger coughing, but the effectiveness of pt's cough is still questioned. A regular texture diet with nectar thick liquids is recommended at this time with continued full supervision for meals. SLP will follow for dysphagia treatment. Factors that may increase risk of adverse event in presence of aspiration [name] & [name] 2021): Factors that may increase risk of adverse event in presence of aspiration [name] & [name] 2021): Reduced cognitive function; Weak cough Recommendations/Plan: Swallowing Evaluation Recommendations Swallowing Evaluation Recommendations Recommendations: PO diet PO Diet Recommendation: Regular; Mildly thick liquids (Level 2, nectar thick) Liquid Administration via: Cup; Straw Medication Administration: Crushed with puree Supervision: Staff to assist with self-feeding; Full supervision/cueing for swallowing strategies Postural changes: Stay upright 30-60 min after meals; Position pt fully upright for meals Oral care recommendations: Oral care BID (2x/day) Treatment Plan Treatment Plan Treatment recommendations: Therapy as outlined in treatment plan below  Follow-up recommendations: Skilled nursing-short term rehab (<3 hours/day) Functional status assessment: Patient has had a recent decline in their functional status and demonstrates the ability to make significant improvements in function in a reasonable and predictable amount of time. Treatment frequency: Min 2x/week Treatment duration: 2 weeks Interventions: Diet toleration management by SLP; Trials of upgraded texture/liquids; Compensatory techniques Recommendations Recommendations for follow up therapy are one component of a multi-disciplinary discharge planning process, led by the attending physician.  Recommendations may be updated based on patient status, additional functional criteria and insurance authorization. Assessment: Orofacial Exam: Orofacial Exam Oral Cavity - Dentition: Adequate natural dentition; Dentures, top Anatomy: Anatomy: WFL Boluses Administered: Boluses Administered Boluses Administered: Thin liquids (Level 0); Mildly thick liquids (Level 2, nectar thick); Moderately thick liquids (Level 3, honey thick); Solid; Puree  Oral Impairment Domain: Oral Impairment Domain Lip Closure: No labial escape Tongue control during bolus hold: Posterior escape of less than half of bolus Bolus preparation/mastication: Timely and efficient chewing and mashing Bolus transport/lingual motion: Slow tongue motion Oral residue: Trace residue lining oral structures Location of oral residue : Tongue Initiation of pharyngeal swallow : Posterior laryngeal surface of the epiglottis  Pharyngeal Impairment Domain: Pharyngeal Impairment Domain Soft palate elevation: No bolus between soft palate (SP)/pharyngeal wall (PW) Laryngeal elevation: Complete superior movement of thyroid cartilage with complete approximation of arytenoids to epiglottic petiole Anterior hyoid excursion: Partial anterior movement Epiglottic movement: Complete inversion Laryngeal vestibule closure: Complete, no air/contrast in laryngeal vestibule  Pharyngeal stripping wave : Present - diminished Pharyngeal contraction (A/P view only): N/A Pharyngoesophageal segment opening: Partial distention/partial duration, partial obstruction of flow Tongue base retraction: Narrow column of contrast or air between tongue base and PPW Pharyngeal residue: Trace residue within or on pharyngeal structures Location of pharyngeal residue: Valleculae; Pharyngeal wall  Esophageal Impairment Domain: Esophageal Impairment Domain Esophageal clearance upright position: Complete clearance, esophageal coating Pill: Pill Consistency administered: Mildly thick liquids (Level 2, nectar thick) Mildly thick liquids (Level 2, nectar thick): WFL (pill was masticated) Penetration/Aspiration Scale Score: Penetration/Aspiration Scale Score 1.  Material does not enter airway: Mildly thick liquids (Level 2, nectar thick); Moderately thick liquids (Level  3, honey thick); Puree; Solid; Pill 3.  Material enters airway, remains ABOVE vocal cords and not ejected out: Thin liquids (Level 0) 5.  Material enters airway, CONTACTS cords and not ejected out: Thin liquids (Level 0) 8.  Material enters airway, passes BELOW cords without attempt by patient to eject out (silent aspiration) : Thin liquids (Level 0) Compensatory Strategies: No data recorded  General Information: Caregiver present: No  Diet Prior to this Study: Regular; Thin liquids (Level 0)   Temperature : Normal   Respiratory Status: WFL   Supplemental O2: None (Room air)   History of Recent Intubation: No  Behavior/Cognition: Alert; Cooperative; Pleasant mood; Confused; Requires cueing; Doesn't follow directions Self-Feeding Abilities: Able to self-feed Baseline vocal quality/speech: Normal Volitional Cough: Able to elicit Volitional Swallow: Able to elicit Exam Limitations: No limitations Goal Planning: Prognosis for improved oropharyngeal function: Good Barriers to Reach Goals: Cognitive deficits No data recorded Patient/Family Stated Goal:  none stated Consulted and agree with results and recommendations: Patient Pain: Pain Assessment Pain Assessment: No/denies pain End of Session: Start Time:SLP Start Time (ACUTE ONLY): 1230 Stop Time: SLP Stop Time (ACUTE ONLY): 1247 Time Calculation:SLP Time Calculation (min) (ACUTE ONLY): 17 min Charges: SLP Evaluations $ SLP Speech Visit: 1 Visit SLP Evaluations $BSS Swallow: 1 Procedure $MBS Swallow: 1 Procedure $Swallowing Treatment: 1 Procedure SLP visit diagnosis: SLP Visit Diagnosis: Dysphagia, oropharyngeal phase (R13.12) Past Medical History: Past Medical History: Diagnosis Date  Anxiety   COPD (chronic obstructive pulmonary disease) (HCC)   Dementia (HCC)   Diverticulosis of colon (without mention of hemorrhage)   Gallstones   GERD (gastroesophageal reflux disease)   Hiatal hernia   Hyperlipidemia   Hypertension   Hypothyroidism   Low back pain   OA R radiculopathy  Lower extremity edema   chronic  Osteopenia   Peptic ulcer   Peripheral vascular disease (HCC)   Personal history of colonic polyps 2001  TUBULAR ADENOMA - [place]  Pneumonia  Past Surgical History: Past Surgical History: Procedure Laterality Date  ABDOMINAL ADHESION SURGERY    ABDOMINAL HYSTERECTOMY    APPENDECTOMY    CARPAL TUNNEL RELEASE Bilateral   CATARACT EXTRACTION W/ INTRAOCULAR LENS  IMPLANT, BILATERAL    CHOLECYSTECTOMY    NECK SURGERY    bone and plate   ROTATOR CUFF REPAIR Bilateral   TUBAL LIGATION   Shanika [name], MS, CCC-SLP Neuro Diagnostic Specialist Acute Rehabilitation Services Office number: [phone] [name] 04/06/2023, 2:04 PM      Kathlen Mody M.D. [facility] 04/07/2023, 3:42 PM  Available via Epic secure chat 7am-7pm After 7 pm, please refer to night coverage provider listed on amion.

## 2023-04-07 NOTE — Evaluation (Signed)
Physical Therapy Evaluation Patient Details Name: [name] MRN: [id number] DOB: August 06, 1936 Today's Date: 04/07/2023  History of Present Illness  87 yo female admitted 6/26 from SNF with AMS. Pt with UTI and aKI. PMhx: advanced dementia, HTN HLD, COPD, hypothyroidism, GERD  Clinical Impression  Pt pleasantly confused oriented to self but not age, situation, date or place. Pt from [facility] and family not present to provide current PLOF but pt was walking with assist last admission. PT currently with decreased strength, transfers and balance also requiring supplemental O2 to maintain >90%. Pt will benefit from acute therapy to maximize mobility, safety and independence to decrease burden of care.   SpO2 87% on RA, 94% on 2L       Recommendations for follow up therapy are one component of a multi-disciplinary discharge planning process, led by the attending physician.  Recommendations may be updated based on patient status, additional functional criteria and insurance authorization.  Follow Up Recommendations Can patient physically be transported by private vehicle: No     Assistance Recommended at Discharge Frequent or constant Supervision/Assistance  Patient can return home with the following  A lot of help with walking and/or transfers;A lot of help with bathing/dressing/bathroom;Assistance with cooking/housework;Direct supervision/assist for financial management;Assist for transportation;Direct supervision/assist for medications management;Assistance with feeding    Equipment Recommendations None recommended by PT  Recommendations for Other Services       Functional Status Assessment Patient has had a recent decline in their functional status and/or demonstrates limited ability to make significant improvements in function in a reasonable and predictable amount of time     Precautions / Restrictions Precautions Precautions: Fall;Other (comment) Precaution Comments:  watch sats, incontinent B/B      Mobility  Bed Mobility Overal bed mobility: Needs Assistance Bed Mobility: Supine to Sit     Supine to sit: Min assist, HOB elevated     General bed mobility comments: HOB 30 degrees with min assist to pivot to EOB, increased time, mod assist to complete final scoot to EOB. Pt incontinent of B/B on arrival with total assist for pericare and linen change    Transfers Overall transfer level: Needs assistance   Transfers: Sit to/from Stand, Bed to chair/wheelchair/BSC Sit to Stand: Min assist           General transfer comment: min assist to stand from bed x 3 trials with LUE hooked with therapist. mod assist to pivot to recliner would benefit from attempting RW next session for stability, not present on eval    Ambulation/Gait               General Gait Details: unsafe to attempt without +2 assist  Stairs            Wheelchair Mobility    Modified Rankin (Stroke Patients Only)       Balance Overall balance assessment: Needs assistance Sitting-balance support: No upper extremity supported, Feet supported Sitting balance-Leahy Scale: Fair Sitting balance - Comments: guarding EOB for balance   Standing balance support: Bilateral upper extremity supported, During functional activity Standing balance-Leahy Scale: Poor Standing balance comment: RUe on rail and LUE hooked with therapist in standing                             Pertinent Vitals/Pain Pain Assessment Pain Assessment: No/denies pain    Home Living Family/patient expects to be discharged to:: Skilled nursing facility  Additional Comments: family not present and pt unable to provide    Prior Function Prior Level of Function : Needs assist             Mobility Comments: was walking with assist last admission, family not present to provide current PLOF ADLs Comments: assume needs assist with ADLs/IADLs     Hand  Dominance        Extremity/Trunk Assessment   Upper Extremity Assessment Upper Extremity Assessment: Generalized weakness    Lower Extremity Assessment Lower Extremity Assessment: Generalized weakness    Cervical / Trunk Assessment Cervical / Trunk Assessment: Kyphotic  Communication   Communication: No difficulties  Cognition Arousal/Alertness: Awake/alert Behavior During Therapy: Flat affect Overall Cognitive Status: History of cognitive impairments - at baseline Area of Impairment: Memory, Attention, Orientation, Following commands                 Orientation Level: Disoriented to, Time, Situation, Place Current Attention Level: Focused Memory: Decreased short-term memory Following Commands: Follows one step commands inconsistently, Follows one step commands with increased time       General Comments: pt not aware of age, oriented to name, follows simple single step commands with increased time        General Comments      Exercises     Assessment/Plan    PT Assessment Patient needs continued PT services  PT Problem List Decreased strength;Decreased cognition;Decreased activity tolerance;Decreased balance;Decreased mobility       PT Treatment Interventions Gait training;DME instruction;Therapeutic exercise;Balance training;Functional mobility training;Therapeutic activities;Patient/family education;Cognitive remediation    PT Goals (Current goals can be found in the Care Plan section)  Acute Rehab PT Goals PT Goal Formulation: Patient unable to participate in goal setting Time For Goal Achievement: 04/21/23 Potential to Achieve Goals: Fair    Frequency Min 2X/week     Co-evaluation               AM-PAC PT "6 Clicks" Mobility  Outcome Measure Help needed turning from your back to your side while in a flat bed without using bedrails?: A Little Help needed moving from lying on your back to sitting on the side of a flat bed without using  bedrails?: A Lot Help needed moving to and from a bed to a chair (including a wheelchair)?: A Lot Help needed standing up from a chair using your arms (e.g., wheelchair or bedside chair)?: A Little Help needed to walk in hospital room?: Total Help needed climbing 3-5 steps with a railing? : Total 6 Click Score: 12    End of Session Equipment Utilized During Treatment: Gait belt;Oxygen Activity Tolerance: Patient tolerated treatment well Patient left: in chair;with call bell/phone within reach;with chair alarm set Nurse Communication: Mobility status PT Visit Diagnosis: Other abnormalities of gait and mobility (R26.89);Difficulty in walking, not elsewhere classified (R26.2)    Time: 1610-9604 PT Time Calculation (min) (ACUTE ONLY): 17 min   Charges:   PT Evaluation $PT Eval Moderate Complexity: 1 Mod          Josilynn Losh P, PT Acute Rehabilitation Services Office: [phone]   Enedina Finner Calisha Tindel 04/07/2023, 10:19 AM

## 2023-04-08 ENCOUNTER — Inpatient Hospital Stay (HOSPITAL_COMMUNITY): Payer: Medicare Other

## 2023-04-08 DIAGNOSIS — N390 Urinary tract infection, site not specified: Secondary | ICD-10-CM | POA: Diagnosis not present

## 2023-04-08 DIAGNOSIS — R531 Weakness: Secondary | ICD-10-CM | POA: Diagnosis not present

## 2023-04-08 DIAGNOSIS — R2681 Unsteadiness on feet: Secondary | ICD-10-CM | POA: Diagnosis not present

## 2023-04-08 DIAGNOSIS — J449 Chronic obstructive pulmonary disease, unspecified: Secondary | ICD-10-CM | POA: Diagnosis not present

## 2023-04-08 DIAGNOSIS — F02B0 Dementia in other diseases classified elsewhere, moderate, without behavioral disturbance, psychotic disturbance, mood disturbance, and anxiety: Secondary | ICD-10-CM | POA: Diagnosis not present

## 2023-04-08 DIAGNOSIS — R0902 Hypoxemia: Secondary | ICD-10-CM | POA: Diagnosis not present

## 2023-04-08 DIAGNOSIS — J9610 Chronic respiratory failure, unspecified whether with hypoxia or hypercapnia: Secondary | ICD-10-CM | POA: Diagnosis not present

## 2023-04-08 DIAGNOSIS — I10 Essential (primary) hypertension: Secondary | ICD-10-CM | POA: Diagnosis not present

## 2023-04-08 DIAGNOSIS — G9341 Metabolic encephalopathy: Secondary | ICD-10-CM | POA: Diagnosis not present

## 2023-04-08 DIAGNOSIS — I700 Atherosclerosis of aorta: Secondary | ICD-10-CM | POA: Diagnosis not present

## 2023-04-08 DIAGNOSIS — R2689 Other abnormalities of gait and mobility: Secondary | ICD-10-CM | POA: Diagnosis not present

## 2023-04-08 DIAGNOSIS — R410 Disorientation, unspecified: Secondary | ICD-10-CM | POA: Diagnosis not present

## 2023-04-08 DIAGNOSIS — E1165 Type 2 diabetes mellitus with hyperglycemia: Secondary | ICD-10-CM | POA: Diagnosis not present

## 2023-04-08 DIAGNOSIS — N179 Acute kidney failure, unspecified: Secondary | ICD-10-CM | POA: Diagnosis not present

## 2023-04-08 DIAGNOSIS — R1312 Dysphagia, oropharyngeal phase: Secondary | ICD-10-CM | POA: Diagnosis not present

## 2023-04-08 DIAGNOSIS — B961 Klebsiella pneumoniae [K. pneumoniae] as the cause of diseases classified elsewhere: Secondary | ICD-10-CM | POA: Diagnosis not present

## 2023-04-08 DIAGNOSIS — F0390 Unspecified dementia without behavioral disturbance: Secondary | ICD-10-CM | POA: Diagnosis not present

## 2023-04-08 DIAGNOSIS — M6281 Muscle weakness (generalized): Secondary | ICD-10-CM | POA: Diagnosis not present

## 2023-04-08 DIAGNOSIS — R4182 Altered mental status, unspecified: Secondary | ICD-10-CM | POA: Diagnosis not present

## 2023-04-08 DIAGNOSIS — N3000 Acute cystitis without hematuria: Secondary | ICD-10-CM | POA: Diagnosis not present

## 2023-04-08 DIAGNOSIS — Z9181 History of falling: Secondary | ICD-10-CM | POA: Diagnosis not present

## 2023-04-08 DIAGNOSIS — M8589 Other specified disorders of bone density and structure, multiple sites: Secondary | ICD-10-CM | POA: Diagnosis not present

## 2023-04-08 DIAGNOSIS — Z7401 Bed confinement status: Secondary | ICD-10-CM | POA: Diagnosis not present

## 2023-04-08 DIAGNOSIS — G301 Alzheimer's disease with late onset: Secondary | ICD-10-CM | POA: Diagnosis not present

## 2023-04-08 MED ORDER — ENSURE ENLIVE PO LIQD: 237.0000 mL | Freq: Two times a day (BID) | ORAL | 12 refills | Status: DC

## 2023-04-08 NOTE — Discharge Summary (Signed)
Physician Discharge Summary   Patient: [name] MRN: [id number] DOB: [birth year]  Admit date:     04/04/2023  Discharge date: 04/08/23  Discharge Physician: Kathlen Mody   PCP: [name], MD   Recommendations at discharge:  Please follow up with outpatient palliative care services.  Please follow up with PCP in 2 weeks as needed.    Discharge Diagnoses: Principal Problem:   AMS (altered mental status) Active Problems:   Urinary tract infection with hematuria   Alzheimer's dementia (HCC)   Acute encephalopathy   AKI (acute kidney injury) (HCC)  Resolved Problems:   * No resolved hospital problems. *  Hospital Course:  [name] is a 87 y.o. female with medical history significant of advanced dementia, HTN HLD, COPD, hypothyroidism, sent from nursing home for evaluation of acute confusion.   Assessment and Plan:   Acute metabolic encephalopathy  Secondary to UTI and AKI.  Admitted with a creatinine of 1.9, improved to 1. 36 to 1.09 With IV fluids.  She is more alert and awake and answering simple questions. Unfortunately urine cultures were not done on admission. On IV rocephin for UTI. Completed the course of antibiotics.  Plan for discharge in am.  She appears to be back to baseline.           Severe hypokalemia:  Replaced, repeat level wnl.    Hypertension:  Well controlled.      Dementia:  Continue with Aricept and memantine.    Hyperlipidemia Continue with zocor.      Hypothyroidism; Resume synthroid.    Mild leukocytosis  Resolved.    COPD Required oxygen overnight, on 2 lit of [place] OXYGEN.  No wheezing heard. CXR on admission does not show any active disease.  Continue with duonebs   Bradycardia:  Resolved.      Type 2 DM:  A1c IS 7.1.  On metformin, which was held on admission. Can resume on discharge.      Consultants: palliative care.  Procedures performed: CXR  Disposition: Skilled nursing  facility Diet recommendation:  Discharge Diet Orders (From admission, onward)     Start     Ordered   04/08/23 0000  Diet - low sodium heart healthy        04/08/23 0944           Regular diet WITH NECTAR THICK LIQUID.  DISCHARGE MEDICATION: Allergies as of 04/08/2023       Reactions   Codeine Sulfate Other (See Comments)   Extreme headaches   Exelon [rivastigmine Tartrate]    nausea   Oxycodone-acetaminophen Other (See Comments)   Extreme headaches        Medication List     STOP taking these medications    doxycycline 100 MG tablet Commonly known as: VIBRA-TABS   predniSONE 10 MG (21) Tbpk tablet Commonly known as: STERAPRED UNI-PAK 21 TAB       TAKE these medications    acetaminophen 325 MG tablet Commonly known as: TYLENOL Take 650 mg by mouth every 6 (six) hours as needed for moderate pain.   aspirin EC 81 MG tablet Take 81 mg by mouth daily.   bisacodyl 10 MG suppository Commonly known as: DULCOLAX Place 10 mg rectally daily as needed for moderate constipation. If not relieved bu MOM   Diclofenac Sodium 1.5 % Soln Place 5 drops onto the skin 3 (three) times daily as needed (pain). For pain applies on joints   donepezil 10 MG tablet Commonly known  as: ARICEPT Take 1 tablet (10 mg total) by mouth at bedtime. Annual appt due in Oct must see provider for future refills   Enema 7-19 GM/118ML Enem Place 1 enema rectally once as needed (constipation). If not relieved by bisacodyl suppository   feeding supplement Liqd Take 237 mLs by mouth 2 (two) times daily between meals.   fluticasone 50 MCG/ACT nasal spray Commonly known as: FLONASE Place 2 sprays into both nostrils daily.   furosemide 40 MG tablet Commonly known as: LASIX Take 40 mg by mouth daily. What changed: Another medication with the same name was removed. Continue taking this medication, and follow the directions you see here.   Incruse Ellipta 62.5 MCG/ACT Aepb Generic drug:  umeclidinium bromide Inhale 1 puff into the lungs daily.   insulin aspart 100 UNIT/ML injection Commonly known as: novoLOG Inject 0-9 Units into the skin in the morning, at noon, in the evening, and at bedtime. Sliding scale;Less than 70 - implement hypoglycemia protocol. 70-120 = 0 units 121-150 = 1 unit 151-200 = 2 units 201-250 = 3 units 251- 300 = 5 units 301-350 = 7 units 351-400 = 9 units Greater than 400 give 9 units and notify MD.   ipratropium-albuterol 0.5-2.5 (3) MG/3ML Soln Commonly known as: DUONEB Take 3 mLs by nebulization every 6 (six) hours as needed. What changed: reasons to take this   loratadine 10 MG tablet Commonly known as: CLARITIN Take 1 tablet (10 mg total) by mouth daily.   losartan 50 MG tablet Commonly known as: COZAAR Take 1 tablet (50 mg total) by mouth daily.   magnesium hydroxide 400 MG/5ML suspension Commonly known as: MILK OF MAGNESIA Take 30 mLs by mouth daily as needed for mild constipation (constipation). If no BM in 3 days   memantine 10 MG tablet Commonly known as: NAMENDA Take 1 tablet (10 mg total) by mouth 2 (two) times daily. Follow-up appt due in August must see provider for future refills   metFORMIN 500 MG tablet Commonly known as: GLUCOPHAGE Take 500 mg by mouth 2 (two) times daily with a meal.   multivitamin tablet Take 1 tablet by mouth daily.   omeprazole 40 MG capsule Commonly known as: PRILOSEC TAKE 1 CAPSULE DAILY   ondansetron 4 MG tablet Commonly known as: ZOFRAN Take 1 tablet (4 mg total) by mouth every 6 (six) hours as needed. Take  1 tab every 6 hours as needed for nausea and vomiting. What changed:  reasons to take this additional instructions   simvastatin 40 MG tablet Commonly known as: ZOCOR Take 1 tablet (40 mg total) by mouth daily at 6 PM.   Synthroid 50 MCG tablet Generic drug: levothyroxine Take 1 tablet (50 mcg total) by mouth daily before breakfast.   Vitamin D3 50 MCG (2000 UT)  capsule Take 1 capsule (2,000 Units total) daily by mouth.        Discharge Exam: Filed Weights   04/04/23 2012  Weight: 60.2 kg   General exam: Appears calm and comfortable  Respiratory system: Clear to auscultation. Respiratory effort normal. Cardiovascular system: S1 & S2 heard, RRR.  Gastrointestinal system: Abdomen is nondistended, soft and nontender. Central nervous system: Alert and oriented to self.  Extremities: no edema.  Skin: No rashes,  Psychiatry: CAm    Condition at discharge: fair  The results of significant diagnostics from this hospitalization (including imaging, microbiology, ancillary and laboratory) are listed below for reference.   Imaging Studies: DG Swallowing Func-Speech Pathology  Result Date: 04/06/2023  Table formatting from the original result was not included. Modified Barium Swallow Study Patient Details Name: [name] MRN: [id number] Date of Birth: 12/30/1935 Today's Date: 04/06/2023 HPI/PMH: HPI: Pt is an 87 y.o. female who presented from SNF for evaluation of acute confusion. Dx acute metabolic encephalopathy secondary to UTI and AKI. CT head negative for acute changes. CXR negative for active disease. PMH: advanced dementia, HTN HLD, COPD, hypothyroidism. Clinical Impression: Clinical Impression: Pt presents with oropharyngeal dysphagia characterized by impaired bolus cohesion, a pharyngeal delay, and reduction in tongue base retraction, pharyngeal stripping, and PES distention. Pt demonstrated vallecular residue, and posterior pharyngeal wall residue which was improved with secondary swallows. Penetration (PAS 3, 5) and trace silent aspiration (PAS 8) were noted with thin liquids via cup and straw secondary to the pharyngeal delay. Prompted coughing was ineffective in expelling penetrated material. Laryngeal invasion was eliminated with use of small individual bolus sizes but, despite prompts, pt exhibited difficulty consistently doing this.  Considering pt's presentation at bedside, SLP supects that larger amounts of aspirated material trigger coughing, but the effectiveness of pt's cough is still questioned. A regular texture diet with nectar thick liquids is recommended at this time with continued full supervision for meals. SLP will follow for dysphagia treatment. Factors that may increase risk of adverse event in presence of aspiration [name] & [name] 2021): Factors that may increase risk of adverse event in presence of aspiration [name] & [name] 2021): Reduced cognitive function; Weak cough Recommendations/Plan: Swallowing Evaluation Recommendations Swallowing Evaluation Recommendations Recommendations: PO diet PO Diet Recommendation: Regular; Mildly thick liquids (Level 2, nectar thick) Liquid Administration via: Cup; Straw Medication Administration: Crushed with puree Supervision: Staff to assist with self-feeding; Full supervision/cueing for swallowing strategies Postural changes: Stay upright 30-60 min after meals; Position pt fully upright for meals Oral care recommendations: Oral care BID (2x/day) Treatment Plan Treatment Plan Treatment recommendations: Therapy as outlined in treatment plan below Follow-up recommendations: Skilled nursing-short term rehab (<3 hours/day) Functional status assessment: Patient has had a recent decline in their functional status and demonstrates the ability to make significant improvements in function in a reasonable and predictable amount of time. Treatment frequency: Min 2x/week Treatment duration: 2 weeks Interventions: Diet toleration management by SLP; Trials of upgraded texture/liquids; Compensatory techniques Recommendations Recommendations for follow up therapy are one component of a multi-disciplinary discharge planning process, led by the attending physician.  Recommendations may be updated based on patient status, additional functional criteria and insurance authorization. Assessment: Orofacial  Exam: Orofacial Exam Oral Cavity - Dentition: Adequate natural dentition; Dentures, top Anatomy: Anatomy: WFL Boluses Administered: Boluses Administered Boluses Administered: Thin liquids (Level 0); Mildly thick liquids (Level 2, nectar thick); Moderately thick liquids (Level 3, honey thick); Solid; Puree  Oral Impairment Domain: Oral Impairment Domain Lip Closure: No labial escape Tongue control during bolus hold: Posterior escape of less than half of bolus Bolus preparation/mastication: Timely and efficient chewing and mashing Bolus transport/lingual motion: Slow tongue motion Oral residue: Trace residue lining oral structures Location of oral residue : Tongue Initiation of pharyngeal swallow : Posterior laryngeal surface of the epiglottis  Pharyngeal Impairment Domain: Pharyngeal Impairment Domain Soft palate elevation: No bolus between soft palate (SP)/pharyngeal wall (PW) Laryngeal elevation: Complete superior movement of thyroid cartilage with complete approximation of arytenoids to epiglottic petiole Anterior hyoid excursion: Partial anterior movement Epiglottic movement: Complete inversion Laryngeal vestibule closure: Complete, no air/contrast in laryngeal vestibule Pharyngeal stripping wave : Present - diminished Pharyngeal contraction (A/P view only): N/A Pharyngoesophageal segment opening:  Partial distention/partial duration, partial obstruction of flow Tongue base retraction: Narrow column of contrast or air between tongue base and PPW Pharyngeal residue: Trace residue within or on pharyngeal structures Location of pharyngeal residue: Valleculae; Pharyngeal wall  Esophageal Impairment Domain: Esophageal Impairment Domain Esophageal clearance upright position: Complete clearance, esophageal coating Pill: Pill Consistency administered: Mildly thick liquids (Level 2, nectar thick) Mildly thick liquids (Level 2, nectar thick): WFL (pill was masticated) Penetration/Aspiration Scale Score:  Penetration/Aspiration Scale Score 1.  Material does not enter airway: Mildly thick liquids (Level 2, nectar thick); Moderately thick liquids (Level 3, honey thick); Puree; Solid; Pill 3.  Material enters airway, remains ABOVE vocal cords and not ejected out: Thin liquids (Level 0) 5.  Material enters airway, CONTACTS cords and not ejected out: Thin liquids (Level 0) 8.  Material enters airway, passes BELOW cords without attempt by patient to eject out (silent aspiration) : Thin liquids (Level 0) Compensatory Strategies: No data recorded  General Information: Caregiver present: No  Diet Prior to this Study: Regular; Thin liquids (Level 0)   Temperature : Normal   Respiratory Status: WFL   Supplemental O2: None (Room air)   History of Recent Intubation: No  Behavior/Cognition: Alert; Cooperative; Pleasant mood; Confused; Requires cueing; Doesn't follow directions Self-Feeding Abilities: Able to self-feed Baseline vocal quality/speech: Normal Volitional Cough: Able to elicit Volitional Swallow: Able to elicit Exam Limitations: No limitations Goal Planning: Prognosis for improved oropharyngeal function: Good Barriers to Reach Goals: Cognitive deficits No data recorded Patient/Family Stated Goal: none stated Consulted and agree with results and recommendations: Patient Pain: Pain Assessment Pain Assessment: No/denies pain End of Session: Start Time:SLP Start Time (ACUTE ONLY): 1230 Stop Time: SLP Stop Time (ACUTE ONLY): 1247 Time Calculation:SLP Time Calculation (min) (ACUTE ONLY): 17 min Charges: SLP Evaluations $ SLP Speech Visit: 1 Visit SLP Evaluations $BSS Swallow: 1 Procedure $MBS Swallow: 1 Procedure $Swallowing Treatment: 1 Procedure SLP visit diagnosis: SLP Visit Diagnosis: Dysphagia, oropharyngeal phase (R13.12) Past Medical History: Past Medical History: Diagnosis Date  Anxiety   COPD (chronic obstructive pulmonary disease) (HCC)   Dementia (HCC)   Diverticulosis of colon (without mention of hemorrhage)    Gallstones   GERD (gastroesophageal reflux disease)   Hiatal hernia   Hyperlipidemia   Hypertension   Hypothyroidism   Low back pain   OA R radiculopathy  Lower extremity edema   chronic  Osteopenia   Peptic ulcer   Peripheral vascular disease (HCC)   Personal history of colonic polyps 2001  TUBULAR ADENOMA - [place]  Pneumonia  Past Surgical History: Past Surgical History: Procedure Laterality Date  ABDOMINAL ADHESION SURGERY    ABDOMINAL HYSTERECTOMY    APPENDECTOMY    CARPAL TUNNEL RELEASE Bilateral   CATARACT EXTRACTION W/ INTRAOCULAR LENS  IMPLANT, BILATERAL    CHOLECYSTECTOMY    NECK SURGERY    bone and plate   ROTATOR CUFF REPAIR Bilateral   TUBAL LIGATION   Shanika [name], MS, CCC-SLP Neuro Diagnostic Specialist Acute Rehabilitation Services Office number: [phone] [name] 04/06/2023, 2:04 PM  CT HEAD WO CONTRAST ( )  Result Date: 04/04/2023 CLINICAL DATA:  Provided history: Delirium. EXAM: CT HEAD WITHOUT CONTRAST TECHNIQUE: Contiguous axial images were obtained from the base of the skull through the vertex without intravenous contrast. RADIATION DOSE REDUCTION: This exam was performed according to the departmental dose-optimization program which includes automated exposure control, adjustment of the mA and/or kV according to patient size and/or use of iterative reconstruction technique. COMPARISON:  Head CT 03/02/2023  and earlier. FINDINGS: Brain: Moderate-to-advanced generalized cerebral atrophy. Mild cerebellar atrophy. Patchy and ill-defined hypoattenuation within the cerebral white matter, nonspecific but compatible with moderate-to-advanced chronic small vessel ischemic disease. There is no acute intracranial hemorrhage. No demarcated cortical infarct. No extra-axial fluid collection. No evidence of an intracranial mass. No midline shift. Vascular: No hyperdense vessel.  Atherosclerotic calcifications. Skull: No fracture or aggressive osseous lesion. Sinuses/Orbits: No mass or  acute finding within the imaged orbits. No significant paranasal sinus disease at the imaged levels. IMPRESSION: 1.  No evidence of an acute intracranial abnormality. 2. Parenchymal atrophy and chronic small vessel ischemic disease, as described. Electronically Signed   By: Jackey Loge D.O.   On: 04/04/2023 18:25   US RENAL  Result Date: 04/04/2023 CLINICAL DATA:  Acute renal insufficiency, hypertension EXAM: RENAL / URINARY TRACT ULTRASOUND COMPLETE COMPARISON:  None Available. FINDINGS: Right Kidney: Renal measurements: 8.3 x 4.3 x 4.7 cm = volume: 87.1 mL. Echogenicity within normal limits. No mass or hydronephrosis visualized. Left Kidney: Renal measurements: 8.2 x 4.7 x 4.2 cm = volume: 83.7 mL. Echogenicity within normal limits. No mass or hydronephrosis visualized. Bladder: Appears normal for degree of bladder distention. Other: Incidental splenule noted in the left upper quadrant. Limited imaging of the liver demonstrates increased parenchymal echotexture compatible with hepatic steatosis. IMPRESSION: 1. Unremarkable renal ultrasound. 2. Hepatic steatosis. Electronically Signed   By: Sharlet Salina M.D.   On: 04/04/2023 18:09   DG Chest Portable 1 View  Result Date: 04/04/2023 CLINICAL DATA:  Hypoxia. EXAM: PORTABLE CHEST 1 VIEW COMPARISON:  Mar 02, 2023. FINDINGS: The heart size and mediastinal contours are within normal limits. Both lungs are clear. The visualized skeletal structures are unremarkable. IMPRESSION: No active disease. Aortic Atherosclerosis (ICD10-I70.0). Electronically Signed   By: Lupita Raider M.D.   On: 04/04/2023 14:30    Microbiology: Results for orders placed or performed during the hospital encounter of 04/04/23  SARS Coronavirus 2 by RT PCR (hospital order, performed in [facility] hospital lab) *cepheid single result test* Anterior Nasal Swab     Status: None   Collection Time: 04/04/23  1:48 PM   Specimen: Anterior Nasal Swab  Result Value Ref Range Status   SARS  Coronavirus 2 by RT PCR NEGATIVE NEGATIVE Final    Comment: Performed at [facility], [address] [place], [place] [address]    Labs: CBC: Recent Labs  Lab 04/04/23 1338 04/05/23 0733 04/06/23 0657  WBC 13.4* 10.7* 9.8  NEUTROABS 8.1*  --  5.2  HGB 15.7* 14.6 14.0  HCT 47.5* 45.6 43.1  MCV 89.5 88.7 89.8  PLT 94* 74* 81*   Basic Metabolic Panel: Recent Labs  Lab 04/04/23 1338 04/04/23 1938 04/05/23 0733 04/06/23 0657  NA 142  --  141 142  K 2.8*  --  3.7 3.8  CL 98  --  103 106  CO2 30  --  27 28  GLUCOSE 146*  --  152* 153*  BUN 51*  --  43* 26*  CREATININE 1.94*  --  1.36* 1.08*  CALCIUM 6.8*  --  7.0* 6.9*  MG  --  0.8* 2.6* 1.9   Liver Function Tests: Recent Labs  Lab 04/04/23 1338  AST 37  ALT 45*  ALKPHOS 62  BILITOT 1.8*  PROT 5.1*  ALBUMIN 2.7*   CBG: Recent Labs  Lab 04/04/23 1334 04/04/23 2330  GLUCAP 137* 155*    Discharge time spent: 46 MINUTES.   Signed: Edson Snowball  Blake Divine, MD [facility] 04/08/2023

## 2023-04-08 NOTE — Plan of Care (Signed)

## 2023-04-08 NOTE — TOC Transition Note (Signed)
Transition of Care [facility]) - CM/SW Discharge Note   Patient Details  Name: [name] MRN: [id number] Date of Birth: 21-Oct-1935  [facility] CM/SW Contact:  Deatra Robinson, [place] Phone Number: 04/08/2023, 1:02 PM   Clinical Narrative:  Pt for dc back to [facility] today. Spoke to [place] in admissions who confirmed pt able to return. Pt's son aware of dc and reports agreeable. RN provided with number for report and PTAR arranged for transport. SW signing off at dc.   Dellie Burns, MSW, LCSW [phone] (coverage)       Final next level of care: Skilled Nursing Facility Barriers to Discharge: No Barriers Identified   Patient Goals and CMS Choice      Discharge Placement                Patient chooses bed at: [facility] Patient to be transferred to facility by: PTAR Name of family member notified: [name]/son Patient and family notified of of transfer: 04/08/23  Discharge Plan and Services Additional resources added to the After Visit Summary for                                       Social Determinants of Health (SDOH) Interventions SDOH Screenings   Food Insecurity: Patient Unable To Answer (04/04/2023)  Housing: Patient Unable To Answer (04/04/2023)  Transportation Needs: Patient Unable To Answer (04/04/2023)  Utilities: Patient Unable To Answer (04/04/2023)  Alcohol Screen: Low Risk  (06/06/2022)  Depression (PHQ2-9): Low Risk  (11/16/2022)  Financial Resource Strain: Low Risk  (06/06/2022)  Physical Activity: Inactive (06/06/2022)  Social Connections: Socially Integrated (06/06/2022)  Stress: No Stress Concern Present (06/06/2022)  Tobacco Use: High Risk (04/04/2023)     Readmission Risk Interventions     No data to display

## 2023-04-09 DIAGNOSIS — M6281 Muscle weakness (generalized): Secondary | ICD-10-CM | POA: Diagnosis not present

## 2023-04-09 DIAGNOSIS — R2689 Other abnormalities of gait and mobility: Secondary | ICD-10-CM | POA: Diagnosis not present

## 2023-04-09 DIAGNOSIS — F0390 Unspecified dementia without behavioral disturbance: Secondary | ICD-10-CM | POA: Diagnosis not present

## 2023-04-09 DIAGNOSIS — J449 Chronic obstructive pulmonary disease, unspecified: Secondary | ICD-10-CM | POA: Diagnosis not present

## 2023-04-09 DIAGNOSIS — N390 Urinary tract infection, site not specified: Secondary | ICD-10-CM | POA: Diagnosis not present

## 2023-04-09 DIAGNOSIS — R2681 Unsteadiness on feet: Secondary | ICD-10-CM | POA: Diagnosis not present

## 2023-04-09 NOTE — Consult Note (Signed)
   [facility] CM Inpatient Consult   04/09/2023  [name] Filter May 26, 1936 [id number]  [facility] [[facility]]  Accountable Care Organization [ACO] Patient: Medicare ACO REACH  Primary Care Provider:  [name], MD [place] at [facility]   Patient was reviewed for less than 30 day readmission rising risk to high score to assess for barriers to care for post hospital.  Patient was screened for hospitalization and on behalf of [facility] Care Coordination to assess for post hospital community care needs.  Patient transitioned to [facility] for a skilled nursing facility level of care for post hospital transition.  Notes that patient's family is in the process and seeking for patient to transfer to a Memory Care facility in the [place] area was noted by inpatient [facility] team.  Plan: Will follow up with and update the [facility] RN regarding patient's need for potential care coordination to a memory care facility for post SNF rehab noted.  For questions or referrals, please contact:   Charlesetta Shanks, RN BSN CCM [facility]  [phone] business mobile phone Toll free office [phone]  *Concierge Line  [phone] Fax number: [phone] [email or web address]

## 2023-04-09 NOTE — Telephone Encounter (Signed)
Per MD pt will need OV. He states ok to do a virtual w/ Son. Called [name] (son) inform mom will need ov but it was ok to do virtual. Made appt for 04/24/23 @ 3:00...Raechel Chute

## 2023-04-10 DIAGNOSIS — N3000 Acute cystitis without hematuria: Secondary | ICD-10-CM | POA: Diagnosis not present

## 2023-04-10 DIAGNOSIS — N179 Acute kidney failure, unspecified: Secondary | ICD-10-CM | POA: Diagnosis not present

## 2023-04-10 DIAGNOSIS — J9610 Chronic respiratory failure, unspecified whether with hypoxia or hypercapnia: Secondary | ICD-10-CM | POA: Diagnosis not present

## 2023-04-10 DIAGNOSIS — M6281 Muscle weakness (generalized): Secondary | ICD-10-CM | POA: Diagnosis not present

## 2023-04-10 NOTE — Telephone Encounter (Signed)
OK. Thx

## 2023-04-11 DIAGNOSIS — E1165 Type 2 diabetes mellitus with hyperglycemia: Secondary | ICD-10-CM | POA: Diagnosis not present

## 2023-04-12 DIAGNOSIS — I10 Essential (primary) hypertension: Secondary | ICD-10-CM | POA: Diagnosis not present

## 2023-04-12 DIAGNOSIS — N3000 Acute cystitis without hematuria: Secondary | ICD-10-CM | POA: Diagnosis not present

## 2023-04-12 DIAGNOSIS — N179 Acute kidney failure, unspecified: Secondary | ICD-10-CM | POA: Diagnosis not present

## 2023-04-12 DIAGNOSIS — M6281 Muscle weakness (generalized): Secondary | ICD-10-CM | POA: Diagnosis not present

## 2023-04-16 DIAGNOSIS — R2689 Other abnormalities of gait and mobility: Secondary | ICD-10-CM | POA: Diagnosis not present

## 2023-04-16 DIAGNOSIS — F0390 Unspecified dementia without behavioral disturbance: Secondary | ICD-10-CM | POA: Diagnosis not present

## 2023-04-16 DIAGNOSIS — J449 Chronic obstructive pulmonary disease, unspecified: Secondary | ICD-10-CM | POA: Diagnosis not present

## 2023-04-16 DIAGNOSIS — N390 Urinary tract infection, site not specified: Secondary | ICD-10-CM | POA: Diagnosis not present

## 2023-04-16 DIAGNOSIS — M6281 Muscle weakness (generalized): Secondary | ICD-10-CM | POA: Diagnosis not present

## 2023-04-16 DIAGNOSIS — R2681 Unsteadiness on feet: Secondary | ICD-10-CM | POA: Diagnosis not present

## 2023-04-20 ENCOUNTER — Telehealth: Payer: Self-pay

## 2023-04-20 NOTE — Telephone Encounter (Signed)
Prolia VOB initiated via [email or web address]  Last Prolia inj: 11/16/22 Next Prolia inj DUE: 05/16/23 

## 2023-04-23 NOTE — Telephone Encounter (Signed)
Pt ready for scheduling for PROLIA on or after : 05/16/23  Out-of-pocket cost due at time of visit: $0  Primary: MEDICARE Prolia co-insurance: 0% Admin fee co-insurance: 0%  Secondary: TRICARE FOR LIFE Prolia co-insurance:  Admin fee co-insurance:   Medical Benefit Details: Date Benefits were checked: 04/23/23 Deductible: $240 met of $240 required/ Coinsurance: 0%/ Admin Fee: 0%  Prior Auth: N/A PA# Expiration Date:    Pharmacy benefit: Copay $--- If patient wants fill through the pharmacy benefit please send prescription to:  --- , and include estimated need by date in rx notes. Pharmacy will ship medication directly to the office.  Patient NOT eligible for Prolia Copay Card. Copay Card can make patient's cost as little as $25. Link to apply: [email or web address]://[email or web address]  ** This summary of benefits is an estimation of the patient's out-of-pocket cost. Exact cost may very based on individual plan coverage.

## 2023-04-24 ENCOUNTER — Telehealth: Payer: Medicare Other | Admitting: Internal Medicine

## 2023-04-24 ENCOUNTER — Other Ambulatory Visit: Payer: Self-pay | Admitting: *Deleted

## 2023-04-24 DIAGNOSIS — R5381 Other malaise: Secondary | ICD-10-CM | POA: Diagnosis not present

## 2023-04-24 DIAGNOSIS — R059 Cough, unspecified: Secondary | ICD-10-CM | POA: Diagnosis not present

## 2023-04-24 DIAGNOSIS — Z79899 Other long term (current) drug therapy: Secondary | ICD-10-CM | POA: Diagnosis not present

## 2023-04-24 DIAGNOSIS — F0390 Unspecified dementia without behavioral disturbance: Secondary | ICD-10-CM | POA: Diagnosis not present

## 2023-04-24 DIAGNOSIS — J9811 Atelectasis: Secondary | ICD-10-CM | POA: Diagnosis not present

## 2023-04-24 DIAGNOSIS — A419 Sepsis, unspecified organism: Secondary | ICD-10-CM | POA: Diagnosis not present

## 2023-04-24 DIAGNOSIS — R739 Hyperglycemia, unspecified: Secondary | ICD-10-CM | POA: Diagnosis not present

## 2023-04-24 DIAGNOSIS — Z9189 Other specified personal risk factors, not elsewhere classified: Secondary | ICD-10-CM | POA: Diagnosis not present

## 2023-04-24 DIAGNOSIS — J441 Chronic obstructive pulmonary disease with (acute) exacerbation: Secondary | ICD-10-CM | POA: Diagnosis not present

## 2023-04-24 DIAGNOSIS — Z1152 Encounter for screening for COVID-19: Secondary | ICD-10-CM | POA: Diagnosis not present

## 2023-04-24 DIAGNOSIS — G9341 Metabolic encephalopathy: Secondary | ICD-10-CM | POA: Diagnosis not present

## 2023-04-24 DIAGNOSIS — Z794 Long term (current) use of insulin: Secondary | ICD-10-CM | POA: Diagnosis not present

## 2023-04-24 DIAGNOSIS — Z0389 Encounter for observation for other suspected diseases and conditions ruled out: Secondary | ICD-10-CM | POA: Diagnosis not present

## 2023-04-24 DIAGNOSIS — Z7984 Long term (current) use of oral hypoglycemic drugs: Secondary | ICD-10-CM | POA: Diagnosis not present

## 2023-04-24 DIAGNOSIS — Z7189 Other specified counseling: Secondary | ICD-10-CM | POA: Diagnosis not present

## 2023-04-24 DIAGNOSIS — E860 Dehydration: Secondary | ICD-10-CM | POA: Diagnosis not present

## 2023-04-24 DIAGNOSIS — Z7982 Long term (current) use of aspirin: Secondary | ICD-10-CM | POA: Diagnosis not present

## 2023-04-24 DIAGNOSIS — N3000 Acute cystitis without hematuria: Secondary | ICD-10-CM | POA: Diagnosis not present

## 2023-04-24 DIAGNOSIS — E039 Hypothyroidism, unspecified: Secondary | ICD-10-CM | POA: Diagnosis not present

## 2023-04-24 DIAGNOSIS — E872 Acidosis, unspecified: Secondary | ICD-10-CM | POA: Diagnosis not present

## 2023-04-24 DIAGNOSIS — R531 Weakness: Secondary | ICD-10-CM | POA: Diagnosis not present

## 2023-04-24 DIAGNOSIS — Z634 Disappearance and death of family member: Secondary | ICD-10-CM | POA: Diagnosis not present

## 2023-04-24 DIAGNOSIS — R5383 Other fatigue: Secondary | ICD-10-CM | POA: Diagnosis not present

## 2023-04-24 DIAGNOSIS — Z79890 Hormone replacement therapy (postmenopausal): Secondary | ICD-10-CM | POA: Diagnosis not present

## 2023-04-24 DIAGNOSIS — Z789 Other specified health status: Secondary | ICD-10-CM | POA: Diagnosis not present

## 2023-04-24 DIAGNOSIS — Z87440 Personal history of urinary (tract) infections: Secondary | ICD-10-CM | POA: Diagnosis not present

## 2023-04-24 DIAGNOSIS — I2489 Other forms of acute ischemic heart disease: Secondary | ICD-10-CM | POA: Diagnosis not present

## 2023-04-24 DIAGNOSIS — G309 Alzheimer's disease, unspecified: Secondary | ICD-10-CM | POA: Diagnosis not present

## 2023-04-24 DIAGNOSIS — I10 Essential (primary) hypertension: Secondary | ICD-10-CM | POA: Diagnosis not present

## 2023-04-24 DIAGNOSIS — Z66 Do not resuscitate: Secondary | ICD-10-CM | POA: Diagnosis not present

## 2023-04-24 DIAGNOSIS — R069 Unspecified abnormalities of breathing: Secondary | ICD-10-CM | POA: Diagnosis not present

## 2023-04-24 DIAGNOSIS — R062 Wheezing: Secondary | ICD-10-CM | POA: Diagnosis not present

## 2023-04-24 DIAGNOSIS — E785 Hyperlipidemia, unspecified: Secondary | ICD-10-CM | POA: Diagnosis not present

## 2023-04-24 DIAGNOSIS — Z7409 Other reduced mobility: Secondary | ICD-10-CM | POA: Diagnosis not present

## 2023-04-24 DIAGNOSIS — R1310 Dysphagia, unspecified: Secondary | ICD-10-CM | POA: Diagnosis not present

## 2023-04-24 DIAGNOSIS — I499 Cardiac arrhythmia, unspecified: Secondary | ICD-10-CM | POA: Diagnosis not present

## 2023-04-24 DIAGNOSIS — E1165 Type 2 diabetes mellitus with hyperglycemia: Secondary | ICD-10-CM | POA: Diagnosis not present

## 2023-04-24 DIAGNOSIS — R4182 Altered mental status, unspecified: Secondary | ICD-10-CM | POA: Diagnosis not present

## 2023-04-24 DIAGNOSIS — I491 Atrial premature depolarization: Secondary | ICD-10-CM | POA: Diagnosis not present

## 2023-04-24 DIAGNOSIS — F0280 Dementia in other diseases classified elsewhere without behavioral disturbance: Secondary | ICD-10-CM | POA: Diagnosis not present

## 2023-04-24 DIAGNOSIS — I4891 Unspecified atrial fibrillation: Secondary | ICD-10-CM | POA: Diagnosis not present

## 2023-04-24 NOTE — Patient Outreach (Signed)
Post-Acute Care Coordinator follow up. Per [facility] Mrs. Tener discharged from [facility] skilled nursing facility on 04/17/23. Screening for potential care coordination services as benefit of health plan and Primary Care Provider.  Update received from [facility] skilled nursing facility social worker. Mrs. Woollard transitioned to [facility] (ALF/memory) of [place], [place].  No identifiable care coordination needs.   Raiford Noble, MSN, RN,BSN [facility] Post Acute Care Coordinator [phone] (Direct dial)

## 2023-04-25 ENCOUNTER — Telehealth: Payer: Self-pay | Admitting: Internal Medicine

## 2023-04-25 DIAGNOSIS — A419 Sepsis, unspecified organism: Secondary | ICD-10-CM | POA: Diagnosis not present

## 2023-04-25 NOTE — Telephone Encounter (Signed)
Pt  called wanting a call back to go over his mother medication to get a understanding what she is all taking and what is it for. Please advise.  Best call back Jacqlyn Larsen. [phone]

## 2023-04-25 NOTE — Telephone Encounter (Signed)
Called son he states mom with to ER last night. They had ask him did mom have breast cancer. He states he did not know if she had or not. He would like a copy of mom medications since she is leaving with him. Pls email to [email or web address]

## 2023-04-26 DIAGNOSIS — A419 Sepsis, unspecified organism: Secondary | ICD-10-CM | POA: Diagnosis not present

## 2023-04-27 DIAGNOSIS — J9811 Atelectasis: Secondary | ICD-10-CM | POA: Diagnosis not present

## 2023-04-27 DIAGNOSIS — A419 Sepsis, unspecified organism: Secondary | ICD-10-CM | POA: Diagnosis not present

## 2023-04-27 DIAGNOSIS — R069 Unspecified abnormalities of breathing: Secondary | ICD-10-CM | POA: Diagnosis not present

## 2023-04-28 DIAGNOSIS — A419 Sepsis, unspecified organism: Secondary | ICD-10-CM | POA: Diagnosis not present

## 2023-05-01 NOTE — Progress Notes (Deleted)
NEUROLOGY FOLLOW UP OFFICE NOTE  TAMU GOLZ [id number]  Assessment/Plan:   Major neurocognitive disorder secondary to Alzheimer's disease    Donepezil 10mg  QHS and memantine 10mg  BID Follow up one year   Subjective:  [name] is an 87 year old right-handed female with GERD, peripheral vascular disease, hyperlipidemia, COPD, hypertension, hypothyroidism, and anxiety who follows up for Alzheimer's disease.  She is accompanied by her husband who supplements history.   UPDATE: She is taking Aricept 10mg  daily and Namenda 10mg  twice daily.   Memory loss progressed.  She has trouble with some names.  She is able to dress and bathe herself but requires assistance using the toilet.  She doesn't keep the house tidy as she used to.  She does not cook or drive.  Naps often but still able to sleep at night.. She does not get agitated or combative.  She is not hallucinating or delusional.  She denies depression.  ***   HISTORY: Her husband began to notice changes in memory around 2012 or 2013.  She would have problems with short-term memory.  She would forget about phone calls.  She would repeat questions. Her husband usually goes to the grocery store.  She has no difficulty remembering names or faces.  She has no hallucinations or delusions.  She sleeps well.  She is not depressed.  There has been no change in personality or behavior.  She is able to perform all her ADLs.  She has gotten lost while driving.  During the day, she tends to the house but spends time watching TV.  She reads the Bible but not everyday.  She and her husband are active in the church.  They go to choir practice every Wednesday night.  They participate in a senior group once a month.  Her mother and maternal aunt had dementia.   She has history of nausea and mild anorexia.  No vomiting.  She had been on both Aricept and rivastigmine at one time and were discontinued with concern that it was causing the  nausea.  However, the nausea never resolved.  She has been evaluated by GI with no clear etiology.  She was subsequently restarted on Aricept and then Namenda.   11/05/13 CT HEAD:  no bleed, mass lesions or acute infarcts seen.  Mild atrophy and atherosclerotic changes noted.    PAST MEDICAL HISTORY: Past Medical History:  Diagnosis Date   Anxiety    COPD (chronic obstructive pulmonary disease) (HCC)    Dementia (HCC)    Diverticulosis of colon (without mention of hemorrhage)    Gallstones    GERD (gastroesophageal reflux disease)    Hiatal hernia    Hyperlipidemia    Hypertension    Hypothyroidism    Low back pain    OA R radiculopathy   Lower extremity edema    chronic   Osteopenia    Peptic ulcer    Peripheral vascular disease (HCC)    Personal history of colonic polyps 2001   TUBULAR ADENOMA - [place]   Pneumonia     MEDICATIONS: Current Outpatient Medications on File Prior to Visit  Medication Sig Dispense Refill   acetaminophen (TYLENOL) 325 MG tablet Take 650 mg by mouth every 6 (six) hours as needed for moderate pain.     aspirin 81 MG EC tablet Take 81 mg by mouth daily.     bisacodyl (DULCOLAX) 10 MG suppository Place 10 mg rectally daily as needed for moderate constipation. If not  relieved bu MOM     Cholecalciferol (VITAMIN D3) 2000 units capsule Take 1 capsule (2,000 Units total) daily by mouth. 100 capsule 3   donepezil (ARICEPT) 10 MG tablet Take 1 tablet (10 mg total) by mouth at bedtime. Annual appt due in Oct must see provider for future refills 90 tablet 3   feeding supplement (ENSURE ENLIVE / ENSURE PLUS) LIQD Take 237 mLs by mouth 2 (two) times daily between meals. 237 mL 12   furosemide (LASIX) 40 MG tablet Take 40 mg by mouth daily.     insulin aspart (NOVOLOG) 100 UNIT/ML injection Inject 0-9 Units into the skin in the morning, at noon, in the evening, and at bedtime. Sliding scale;Less than 70 - implement hypoglycemia protocol. 70-120 = 0 units 121-150  = 1 unit 151-200 = 2 units 201-250 = 3 units 251- 300 = 5 units 301-350 = 7 units 351-400 = 9 units Greater than 400 give 9 units and notify MD.     ipratropium-albuterol (DUONEB) 0.5-2.5 (3) MG/3ML SOLN Take 3 mLs by nebulization every 6 (six) hours as needed. (Patient taking differently: Take 3 mLs by nebulization every 6 (six) hours as needed (wheezing).) 360 mL    loratadine (CLARITIN) 10 MG tablet Take 1 tablet (10 mg total) by mouth daily. 30 tablet    losartan (COZAAR) 50 MG tablet Take 1 tablet (50 mg total) by mouth daily. 180 tablet 3   magnesium hydroxide (MILK OF MAGNESIA) 400 MG/5ML suspension Take 30 mLs by mouth daily as needed for mild constipation (constipation). If no BM in 3 days     memantine (NAMENDA) 10 MG tablet Take 1 tablet (10 mg total) by mouth 2 (two) times daily. Follow-up appt due in August must see provider for future refills 180 tablet 3   metFORMIN (GLUCOPHAGE) 500 MG tablet Take 500 mg by mouth 2 (two) times daily with a meal.     Multiple Vitamin (MULTIVITAMIN) tablet Take 1 tablet by mouth daily.     omeprazole (PRILOSEC) 40 MG capsule TAKE 1 CAPSULE DAILY (Patient taking differently: Take 40 mg by mouth daily.) 90 capsule 3   ondansetron (ZOFRAN) 4 MG tablet Take 1 tablet (4 mg total) by mouth every 6 (six) hours as needed. Take  1 tab every 6 hours as needed for nausea and vomiting. (Patient taking differently: Take 4 mg by mouth every 6 (six) hours as needed for vomiting or nausea.) 20 tablet 1   simvastatin (ZOCOR) 40 MG tablet Take 1 tablet (40 mg total) by mouth daily at 6 PM. 90 tablet 3   Sodium Phosphates (ENEMA) 7-19 GM/118ML ENEM Place 1 enema rectally once as needed (constipation). If not relieved by bisacodyl suppository     SYNTHROID 50 MCG tablet Take 1 tablet (50 mcg total) by mouth daily before breakfast. 90 tablet 3   umeclidinium bromide (INCRUSE ELLIPTA) 62.5 MCG/ACT AEPB Inhale 1 puff into the lungs daily. 90 each 3   No current  facility-administered medications on file prior to visit.    ALLERGIES: Allergies  Allergen Reactions   Codeine Sulfate Other (See Comments)    Extreme headaches   Exelon [Rivastigmine Tartrate]     nausea   Oxycodone-Acetaminophen Other (See Comments)    Extreme headaches    FAMILY HISTORY: Family History  Problem Relation Age of Onset   Dementia Mother    Hypertension Father    Heart disease Father    Colon cancer Sister    Heart disease Sister  Esophageal cancer Neg Hx    Rectal cancer Neg Hx    Stomach cancer Neg Hx       Objective:  *** General: No acute distress.  Patient appears well-groomed.   Head:  Normocephalic/atraumatic Eyes:  Fundi examined but not visualized Neck: supple, no paraspinal tenderness, full range of motion Heart:  Regular rate and rhythm Neurological Exam:   *** Speech fluent and not dysarthric, language intact.  CN II-XII intact. Bulk and tone normal, muscle strength 5/5 throughout.  Sensation to light touch intact.  Deep tendon reflexes 2+ throughout.  Finger to nose testing intact.  Gait normal, Romberg negative.  Shon Millet, DO  CC: Jacinta Shoe, MD

## 2023-05-02 ENCOUNTER — Encounter: Payer: Self-pay | Admitting: Neurology

## 2023-05-02 ENCOUNTER — Ambulatory Visit: Payer: Medicare Other | Admitting: Neurology

## 2023-05-02 DIAGNOSIS — Z79899 Other long term (current) drug therapy: Secondary | ICD-10-CM | POA: Diagnosis not present

## 2023-05-02 DIAGNOSIS — Z66 Do not resuscitate: Secondary | ICD-10-CM | POA: Diagnosis not present

## 2023-05-02 DIAGNOSIS — Z515 Encounter for palliative care: Secondary | ICD-10-CM | POA: Diagnosis not present

## 2023-05-02 DIAGNOSIS — G309 Alzheimer's disease, unspecified: Secondary | ICD-10-CM | POA: Diagnosis not present

## 2023-05-02 DIAGNOSIS — J439 Emphysema, unspecified: Secondary | ICD-10-CM | POA: Diagnosis not present

## 2023-05-02 DIAGNOSIS — B348 Other viral infections of unspecified site: Secondary | ICD-10-CM | POA: Diagnosis not present

## 2023-05-02 DIAGNOSIS — Z7982 Long term (current) use of aspirin: Secondary | ICD-10-CM | POA: Diagnosis not present

## 2023-05-02 DIAGNOSIS — J189 Pneumonia, unspecified organism: Secondary | ICD-10-CM | POA: Diagnosis present

## 2023-05-02 DIAGNOSIS — Z79890 Hormone replacement therapy (postmenopausal): Secondary | ICD-10-CM | POA: Diagnosis not present

## 2023-05-02 DIAGNOSIS — J206 Acute bronchitis due to rhinovirus: Secondary | ICD-10-CM | POA: Diagnosis not present

## 2023-05-02 DIAGNOSIS — J440 Chronic obstructive pulmonary disease with acute lower respiratory infection: Secondary | ICD-10-CM | POA: Diagnosis not present

## 2023-05-02 DIAGNOSIS — F02C0 Dementia in other diseases classified elsewhere, severe, without behavioral disturbance, psychotic disturbance, mood disturbance, and anxiety: Secondary | ICD-10-CM | POA: Diagnosis not present

## 2023-05-02 DIAGNOSIS — I482 Chronic atrial fibrillation, unspecified: Secondary | ICD-10-CM | POA: Diagnosis not present

## 2023-05-02 DIAGNOSIS — I10 Essential (primary) hypertension: Secondary | ICD-10-CM | POA: Diagnosis not present

## 2023-05-02 DIAGNOSIS — R6510 Systemic inflammatory response syndrome (SIRS) of non-infectious origin without acute organ dysfunction: Secondary | ICD-10-CM | POA: Diagnosis not present

## 2023-05-02 DIAGNOSIS — R0602 Shortness of breath: Secondary | ICD-10-CM | POA: Diagnosis not present

## 2023-05-02 DIAGNOSIS — J441 Chronic obstructive pulmonary disease with (acute) exacerbation: Secondary | ICD-10-CM | POA: Diagnosis not present

## 2023-05-02 DIAGNOSIS — E118 Type 2 diabetes mellitus with unspecified complications: Secondary | ICD-10-CM | POA: Diagnosis not present

## 2023-05-02 DIAGNOSIS — Z1152 Encounter for screening for COVID-19: Secondary | ICD-10-CM | POA: Diagnosis not present

## 2023-05-02 DIAGNOSIS — E119 Type 2 diabetes mellitus without complications: Secondary | ICD-10-CM | POA: Diagnosis not present

## 2023-05-02 DIAGNOSIS — Z7984 Long term (current) use of oral hypoglycemic drugs: Secondary | ICD-10-CM | POA: Diagnosis not present

## 2023-05-02 DIAGNOSIS — I4891 Unspecified atrial fibrillation: Secondary | ICD-10-CM | POA: Diagnosis not present

## 2023-05-02 DIAGNOSIS — Z7951 Long term (current) use of inhaled steroids: Secondary | ICD-10-CM | POA: Diagnosis not present

## 2023-05-02 DIAGNOSIS — A4189 Other specified sepsis: Secondary | ICD-10-CM | POA: Diagnosis not present

## 2023-05-02 DIAGNOSIS — J9601 Acute respiratory failure with hypoxia: Secondary | ICD-10-CM | POA: Diagnosis not present

## 2023-05-02 DIAGNOSIS — Z7189 Other specified counseling: Secondary | ICD-10-CM | POA: Diagnosis not present

## 2023-05-08 DIAGNOSIS — I4891 Unspecified atrial fibrillation: Secondary | ICD-10-CM | POA: Diagnosis not present

## 2023-05-08 DIAGNOSIS — J206 Acute bronchitis due to rhinovirus: Secondary | ICD-10-CM | POA: Diagnosis not present

## 2023-05-08 DIAGNOSIS — Z87891 Personal history of nicotine dependence: Secondary | ICD-10-CM | POA: Diagnosis not present

## 2023-05-08 DIAGNOSIS — E119 Type 2 diabetes mellitus without complications: Secondary | ICD-10-CM | POA: Diagnosis not present

## 2023-05-08 DIAGNOSIS — J441 Chronic obstructive pulmonary disease with (acute) exacerbation: Secondary | ICD-10-CM | POA: Diagnosis not present

## 2023-05-08 DIAGNOSIS — F0280 Dementia in other diseases classified elsewhere without behavioral disturbance: Secondary | ICD-10-CM | POA: Diagnosis not present

## 2023-05-08 DIAGNOSIS — K7460 Unspecified cirrhosis of liver: Secondary | ICD-10-CM | POA: Diagnosis not present

## 2023-05-08 DIAGNOSIS — J9601 Acute respiratory failure with hypoxia: Secondary | ICD-10-CM | POA: Diagnosis not present

## 2023-05-08 DIAGNOSIS — J440 Chronic obstructive pulmonary disease with acute lower respiratory infection: Secondary | ICD-10-CM | POA: Diagnosis not present

## 2023-05-08 DIAGNOSIS — G309 Alzheimer's disease, unspecified: Secondary | ICD-10-CM | POA: Diagnosis not present

## 2023-05-09 DIAGNOSIS — J40 Bronchitis, not specified as acute or chronic: Secondary | ICD-10-CM | POA: Diagnosis not present

## 2023-05-09 DIAGNOSIS — J449 Chronic obstructive pulmonary disease, unspecified: Secondary | ICD-10-CM | POA: Diagnosis not present

## 2023-05-09 DIAGNOSIS — G309 Alzheimer's disease, unspecified: Secondary | ICD-10-CM | POA: Diagnosis not present

## 2023-05-09 DIAGNOSIS — Z758 Other problems related to medical facilities and other health care: Secondary | ICD-10-CM | POA: Diagnosis not present

## 2023-05-09 DIAGNOSIS — R600 Localized edema: Secondary | ICD-10-CM | POA: Diagnosis not present

## 2023-05-11 DIAGNOSIS — E119 Type 2 diabetes mellitus without complications: Secondary | ICD-10-CM | POA: Diagnosis not present

## 2023-05-11 DIAGNOSIS — J9601 Acute respiratory failure with hypoxia: Secondary | ICD-10-CM | POA: Diagnosis not present

## 2023-05-11 DIAGNOSIS — I4891 Unspecified atrial fibrillation: Secondary | ICD-10-CM | POA: Diagnosis not present

## 2023-05-11 DIAGNOSIS — J440 Chronic obstructive pulmonary disease with acute lower respiratory infection: Secondary | ICD-10-CM | POA: Diagnosis not present

## 2023-05-11 DIAGNOSIS — J441 Chronic obstructive pulmonary disease with (acute) exacerbation: Secondary | ICD-10-CM | POA: Diagnosis not present

## 2023-05-11 DIAGNOSIS — J206 Acute bronchitis due to rhinovirus: Secondary | ICD-10-CM | POA: Diagnosis not present

## 2023-05-15 DIAGNOSIS — F0280 Dementia in other diseases classified elsewhere without behavioral disturbance: Secondary | ICD-10-CM | POA: Diagnosis not present

## 2023-05-15 DIAGNOSIS — J449 Chronic obstructive pulmonary disease, unspecified: Secondary | ICD-10-CM | POA: Diagnosis not present

## 2023-05-15 DIAGNOSIS — Z515 Encounter for palliative care: Secondary | ICD-10-CM | POA: Diagnosis not present

## 2023-05-15 DIAGNOSIS — G309 Alzheimer's disease, unspecified: Secondary | ICD-10-CM | POA: Diagnosis not present

## 2023-05-16 DIAGNOSIS — E039 Hypothyroidism, unspecified: Secondary | ICD-10-CM | POA: Diagnosis not present

## 2023-05-16 DIAGNOSIS — J440 Chronic obstructive pulmonary disease with acute lower respiratory infection: Secondary | ICD-10-CM | POA: Diagnosis not present

## 2023-05-16 DIAGNOSIS — Z7982 Long term (current) use of aspirin: Secondary | ICD-10-CM | POA: Diagnosis not present

## 2023-05-16 DIAGNOSIS — E1159 Type 2 diabetes mellitus with other circulatory complications: Secondary | ICD-10-CM | POA: Diagnosis not present

## 2023-05-16 DIAGNOSIS — I110 Hypertensive heart disease with heart failure: Secondary | ICD-10-CM | POA: Diagnosis not present

## 2023-05-16 DIAGNOSIS — J188 Other pneumonia, unspecified organism: Secondary | ICD-10-CM | POA: Diagnosis not present

## 2023-05-16 DIAGNOSIS — Z993 Dependence on wheelchair: Secondary | ICD-10-CM | POA: Diagnosis not present

## 2023-05-16 DIAGNOSIS — J449 Chronic obstructive pulmonary disease, unspecified: Secondary | ICD-10-CM | POA: Diagnosis not present

## 2023-05-16 DIAGNOSIS — I509 Heart failure, unspecified: Secondary | ICD-10-CM | POA: Diagnosis not present

## 2023-05-16 DIAGNOSIS — S80821D Blister (nonthermal), right lower leg, subsequent encounter: Secondary | ICD-10-CM | POA: Diagnosis not present

## 2023-05-16 DIAGNOSIS — G309 Alzheimer's disease, unspecified: Secondary | ICD-10-CM | POA: Diagnosis not present

## 2023-05-16 DIAGNOSIS — J206 Acute bronchitis due to rhinovirus: Secondary | ICD-10-CM | POA: Diagnosis not present

## 2023-05-16 DIAGNOSIS — S80822D Blister (nonthermal), left lower leg, subsequent encounter: Secondary | ICD-10-CM | POA: Diagnosis not present

## 2023-05-16 DIAGNOSIS — J441 Chronic obstructive pulmonary disease with (acute) exacerbation: Secondary | ICD-10-CM | POA: Diagnosis not present

## 2023-05-16 DIAGNOSIS — J40 Bronchitis, not specified as acute or chronic: Secondary | ICD-10-CM | POA: Diagnosis not present

## 2023-05-16 DIAGNOSIS — E785 Hyperlipidemia, unspecified: Secondary | ICD-10-CM | POA: Diagnosis not present

## 2023-05-16 DIAGNOSIS — R600 Localized edema: Secondary | ICD-10-CM | POA: Diagnosis not present

## 2023-05-16 DIAGNOSIS — Z7984 Long term (current) use of oral hypoglycemic drugs: Secondary | ICD-10-CM | POA: Diagnosis not present

## 2023-05-16 DIAGNOSIS — E119 Type 2 diabetes mellitus without complications: Secondary | ICD-10-CM | POA: Diagnosis not present

## 2023-05-16 DIAGNOSIS — Z556 Problems related to health literacy: Secondary | ICD-10-CM | POA: Diagnosis not present

## 2023-05-16 DIAGNOSIS — F0283 Dementia in other diseases classified elsewhere, unspecified severity, with mood disturbance: Secondary | ICD-10-CM | POA: Diagnosis not present

## 2023-05-17 DIAGNOSIS — E039 Hypothyroidism, unspecified: Secondary | ICD-10-CM | POA: Diagnosis not present

## 2023-05-17 DIAGNOSIS — R600 Localized edema: Secondary | ICD-10-CM | POA: Diagnosis not present

## 2023-05-17 DIAGNOSIS — J449 Chronic obstructive pulmonary disease, unspecified: Secondary | ICD-10-CM | POA: Diagnosis not present

## 2023-05-17 DIAGNOSIS — R4182 Altered mental status, unspecified: Secondary | ICD-10-CM | POA: Diagnosis not present

## 2023-05-17 DIAGNOSIS — J9602 Acute respiratory failure with hypercapnia: Secondary | ICD-10-CM | POA: Diagnosis not present

## 2023-05-17 DIAGNOSIS — J9601 Acute respiratory failure with hypoxia: Secondary | ICD-10-CM | POA: Diagnosis not present

## 2023-05-17 DIAGNOSIS — F02C0 Dementia in other diseases classified elsewhere, severe, without behavioral disturbance, psychotic disturbance, mood disturbance, and anxiety: Secondary | ICD-10-CM | POA: Diagnosis not present

## 2023-05-17 DIAGNOSIS — E876 Hypokalemia: Secondary | ICD-10-CM | POA: Diagnosis not present

## 2023-05-17 DIAGNOSIS — J9600 Acute respiratory failure, unspecified whether with hypoxia or hypercapnia: Secondary | ICD-10-CM | POA: Diagnosis not present

## 2023-05-17 DIAGNOSIS — S90822A Blister (nonthermal), left foot, initial encounter: Secondary | ICD-10-CM | POA: Diagnosis not present

## 2023-05-17 DIAGNOSIS — Z515 Encounter for palliative care: Secondary | ICD-10-CM | POA: Diagnosis not present

## 2023-05-17 DIAGNOSIS — Z66 Do not resuscitate: Secondary | ICD-10-CM | POA: Diagnosis not present

## 2023-05-17 DIAGNOSIS — I4891 Unspecified atrial fibrillation: Secondary | ICD-10-CM | POA: Diagnosis not present

## 2023-05-17 DIAGNOSIS — S90821A Blister (nonthermal), right foot, initial encounter: Secondary | ICD-10-CM | POA: Diagnosis not present

## 2023-05-17 DIAGNOSIS — F03C18 Unspecified dementia, severe, with other behavioral disturbance: Secondary | ICD-10-CM | POA: Diagnosis not present

## 2023-05-17 DIAGNOSIS — R799 Abnormal finding of blood chemistry, unspecified: Secondary | ICD-10-CM | POA: Diagnosis not present

## 2023-05-17 DIAGNOSIS — Z1152 Encounter for screening for COVID-19: Secondary | ICD-10-CM | POA: Diagnosis not present

## 2023-05-17 DIAGNOSIS — Z9181 History of falling: Secondary | ICD-10-CM | POA: Diagnosis not present

## 2023-05-17 DIAGNOSIS — Z7189 Other specified counseling: Secondary | ICD-10-CM | POA: Diagnosis not present

## 2023-05-17 DIAGNOSIS — Z9981 Dependence on supplemental oxygen: Secondary | ICD-10-CM | POA: Diagnosis not present

## 2023-05-17 DIAGNOSIS — I509 Heart failure, unspecified: Secondary | ICD-10-CM | POA: Diagnosis not present

## 2023-05-17 DIAGNOSIS — Z7409 Other reduced mobility: Secondary | ICD-10-CM | POA: Diagnosis not present

## 2023-05-17 DIAGNOSIS — R0602 Shortness of breath: Secondary | ICD-10-CM | POA: Diagnosis not present

## 2023-05-17 DIAGNOSIS — I480 Paroxysmal atrial fibrillation: Secondary | ICD-10-CM | POA: Diagnosis not present

## 2023-05-17 DIAGNOSIS — J441 Chronic obstructive pulmonary disease with (acute) exacerbation: Secondary | ICD-10-CM | POA: Diagnosis not present

## 2023-05-17 DIAGNOSIS — D6869 Other thrombophilia: Secondary | ICD-10-CM | POA: Diagnosis not present

## 2023-05-17 DIAGNOSIS — I110 Hypertensive heart disease with heart failure: Secondary | ICD-10-CM | POA: Diagnosis not present

## 2023-05-17 DIAGNOSIS — R1312 Dysphagia, oropharyngeal phase: Secondary | ICD-10-CM | POA: Diagnosis not present

## 2023-05-17 DIAGNOSIS — K115 Sialolithiasis: Secondary | ICD-10-CM | POA: Diagnosis not present

## 2023-05-17 DIAGNOSIS — N3946 Mixed incontinence: Secondary | ICD-10-CM | POA: Diagnosis not present

## 2023-05-17 DIAGNOSIS — J439 Emphysema, unspecified: Secondary | ICD-10-CM | POA: Diagnosis not present

## 2023-05-17 DIAGNOSIS — E119 Type 2 diabetes mellitus without complications: Secondary | ICD-10-CM | POA: Diagnosis not present

## 2023-05-17 DIAGNOSIS — Z87891 Personal history of nicotine dependence: Secondary | ICD-10-CM | POA: Diagnosis not present

## 2023-05-17 DIAGNOSIS — E11628 Type 2 diabetes mellitus with other skin complications: Secondary | ICD-10-CM | POA: Diagnosis not present

## 2023-05-17 DIAGNOSIS — G309 Alzheimer's disease, unspecified: Secondary | ICD-10-CM | POA: Diagnosis not present

## 2023-05-17 DIAGNOSIS — G301 Alzheimer's disease with late onset: Secondary | ICD-10-CM | POA: Diagnosis not present

## 2023-05-17 DIAGNOSIS — E785 Hyperlipidemia, unspecified: Secondary | ICD-10-CM | POA: Diagnosis not present

## 2023-05-17 DIAGNOSIS — Z741 Need for assistance with personal care: Secondary | ICD-10-CM | POA: Diagnosis not present

## 2023-05-30 DIAGNOSIS — G309 Alzheimer's disease, unspecified: Secondary | ICD-10-CM | POA: Diagnosis not present

## 2023-05-30 DIAGNOSIS — J449 Chronic obstructive pulmonary disease, unspecified: Secondary | ICD-10-CM | POA: Diagnosis not present

## 2023-06-10 DIAGNOSIS — I4891 Unspecified atrial fibrillation: Secondary | ICD-10-CM | POA: Diagnosis not present

## 2023-06-10 DIAGNOSIS — N3946 Mixed incontinence: Secondary | ICD-10-CM | POA: Diagnosis not present

## 2023-06-10 DIAGNOSIS — E11628 Type 2 diabetes mellitus with other skin complications: Secondary | ICD-10-CM | POA: Diagnosis not present

## 2023-06-10 DIAGNOSIS — E039 Hypothyroidism, unspecified: Secondary | ICD-10-CM | POA: Diagnosis not present

## 2023-06-10 DIAGNOSIS — J9601 Acute respiratory failure with hypoxia: Secondary | ICD-10-CM | POA: Diagnosis not present

## 2023-06-10 DIAGNOSIS — E785 Hyperlipidemia, unspecified: Secondary | ICD-10-CM | POA: Diagnosis not present

## 2023-06-10 DIAGNOSIS — E876 Hypokalemia: Secondary | ICD-10-CM | POA: Diagnosis not present

## 2023-06-10 DIAGNOSIS — R1312 Dysphagia, oropharyngeal phase: Secondary | ICD-10-CM | POA: Diagnosis not present

## 2023-06-10 DIAGNOSIS — G309 Alzheimer's disease, unspecified: Secondary | ICD-10-CM | POA: Diagnosis not present

## 2023-06-10 DIAGNOSIS — F03C18 Unspecified dementia, severe, with other behavioral disturbance: Secondary | ICD-10-CM | POA: Diagnosis not present

## 2023-06-10 DIAGNOSIS — Z741 Need for assistance with personal care: Secondary | ICD-10-CM | POA: Diagnosis not present

## 2023-06-10 DIAGNOSIS — J449 Chronic obstructive pulmonary disease, unspecified: Secondary | ICD-10-CM | POA: Diagnosis not present

## 2023-07-10 DIAGNOSIS — G309 Alzheimer's disease, unspecified: Secondary | ICD-10-CM | POA: Diagnosis not present

## 2023-07-10 DIAGNOSIS — N3946 Mixed incontinence: Secondary | ICD-10-CM | POA: Diagnosis not present

## 2023-07-10 DIAGNOSIS — E11628 Type 2 diabetes mellitus with other skin complications: Secondary | ICD-10-CM | POA: Diagnosis not present

## 2023-07-10 DIAGNOSIS — Z741 Need for assistance with personal care: Secondary | ICD-10-CM | POA: Diagnosis not present

## 2023-07-10 DIAGNOSIS — R1312 Dysphagia, oropharyngeal phase: Secondary | ICD-10-CM | POA: Diagnosis not present

## 2023-07-10 DIAGNOSIS — J9601 Acute respiratory failure with hypoxia: Secondary | ICD-10-CM | POA: Diagnosis not present

## 2023-07-10 DIAGNOSIS — J449 Chronic obstructive pulmonary disease, unspecified: Secondary | ICD-10-CM | POA: Diagnosis not present

## 2023-07-10 DIAGNOSIS — F03C18 Unspecified dementia, severe, with other behavioral disturbance: Secondary | ICD-10-CM | POA: Diagnosis not present

## 2023-07-10 DIAGNOSIS — I4891 Unspecified atrial fibrillation: Secondary | ICD-10-CM | POA: Diagnosis not present

## 2023-07-10 DIAGNOSIS — E876 Hypokalemia: Secondary | ICD-10-CM | POA: Diagnosis not present

## 2023-07-10 DIAGNOSIS — E039 Hypothyroidism, unspecified: Secondary | ICD-10-CM | POA: Diagnosis not present

## 2023-07-10 DIAGNOSIS — E785 Hyperlipidemia, unspecified: Secondary | ICD-10-CM | POA: Diagnosis not present

## 2023-08-10 DEATH — deceased
